# Patient Record
Sex: Male | Born: 1960 | Race: Black or African American | Hispanic: No | Marital: Married | Smoking: Never smoker
Health system: Southern US, Community
[De-identification: ages and names within clinical notes are randomized; demographics above are authoritative.]

## PROBLEM LIST (undated history)

## (undated) DIAGNOSIS — N179 Acute kidney failure, unspecified: Secondary | ICD-10-CM

## (undated) DIAGNOSIS — I10 Essential (primary) hypertension: Secondary | ICD-10-CM

## (undated) DIAGNOSIS — R29898 Other symptoms and signs involving the musculoskeletal system: Secondary | ICD-10-CM

## (undated) DIAGNOSIS — G0491 Myelitis, unspecified: Secondary | ICD-10-CM

## (undated) DIAGNOSIS — I729 Aneurysm of unspecified site: Secondary | ICD-10-CM

## (undated) DIAGNOSIS — R339 Retention of urine, unspecified: Secondary | ICD-10-CM

---

## 1984-05-11 HISTORY — PX: FRACTURE SURGERY: SHX138

## 2016-05-23 ENCOUNTER — Encounter (HOSPITAL_COMMUNITY): Payer: Self-pay | Admitting: Emergency Medicine

## 2016-05-23 DIAGNOSIS — R1013 Epigastric pain: Secondary | ICD-10-CM | POA: Diagnosis present

## 2016-05-23 DIAGNOSIS — R1011 Right upper quadrant pain: Secondary | ICD-10-CM | POA: Diagnosis not present

## 2016-05-23 DIAGNOSIS — K5900 Constipation, unspecified: Secondary | ICD-10-CM | POA: Insufficient documentation

## 2016-05-23 LAB — URINALYSIS, ROUTINE W REFLEX MICROSCOPIC
Bilirubin Urine: NEGATIVE
GLUCOSE, UA: NEGATIVE mg/dL
HGB URINE DIPSTICK: NEGATIVE
Ketones, ur: NEGATIVE mg/dL
LEUKOCYTES UA: NEGATIVE
Nitrite: NEGATIVE
PH: 5 (ref 5.0–8.0)
Protein, ur: NEGATIVE mg/dL
SPECIFIC GRAVITY, URINE: 1.023 (ref 1.005–1.030)

## 2016-05-23 LAB — CBC
HEMATOCRIT: 42.3 % (ref 39.0–52.0)
Hemoglobin: 14.5 g/dL (ref 13.0–17.0)
MCH: 29.5 pg (ref 26.0–34.0)
MCHC: 34.3 g/dL (ref 30.0–36.0)
MCV: 86.2 fL (ref 78.0–100.0)
Platelets: 216 10*3/uL (ref 150–400)
RBC: 4.91 MIL/uL (ref 4.22–5.81)
RDW: 12.6 % (ref 11.5–15.5)
WBC: 6.2 10*3/uL (ref 4.0–10.5)

## 2016-05-23 LAB — LIPASE, BLOOD: LIPASE: 31 U/L (ref 11–51)

## 2016-05-23 LAB — COMPREHENSIVE METABOLIC PANEL
ALT: 27 U/L (ref 17–63)
AST: 26 U/L (ref 15–41)
Albumin: 4.5 g/dL (ref 3.5–5.0)
Alkaline Phosphatase: 71 U/L (ref 38–126)
Anion gap: 11 (ref 5–15)
BUN: 15 mg/dL (ref 6–20)
CHLORIDE: 102 mmol/L (ref 101–111)
CO2: 25 mmol/L (ref 22–32)
Calcium: 9.6 mg/dL (ref 8.9–10.3)
Creatinine, Ser: 1.32 mg/dL — ABNORMAL HIGH (ref 0.61–1.24)
GFR, EST NON AFRICAN AMERICAN: 60 mL/min — AB (ref >60)
Glucose, Bld: 103 mg/dL — ABNORMAL HIGH (ref 65–99)
POTASSIUM: 4.2 mmol/L (ref 3.5–5.1)
SODIUM: 138 mmol/L (ref 135–145)
Total Bilirubin: 0.7 mg/dL (ref 0.3–1.2)
Total Protein: 7.6 g/dL (ref 6.5–8.1)

## 2016-05-23 NOTE — ED Triage Notes (Signed)
Patient with abdominal pain from right side to left and around to back.  No vomiting, no nausea.  Patient states that it just hurts.  No pain with urination.  Patient states that he may be just a little bit of constipation.  Last BM was three days ago.

## 2016-05-24 ENCOUNTER — Emergency Department (HOSPITAL_COMMUNITY): Payer: Medicaid Other

## 2016-05-24 ENCOUNTER — Emergency Department (HOSPITAL_COMMUNITY)
Admission: EM | Admit: 2016-05-24 | Discharge: 2016-05-24 | Disposition: A | Discharge status: Home / Self Care | Payer: Medicaid Other | Attending: Emergency Medicine | Admitting: Emergency Medicine

## 2016-05-24 DIAGNOSIS — R109 Unspecified abdominal pain: Secondary | ICD-10-CM

## 2016-05-24 DIAGNOSIS — K5900 Constipation, unspecified: Secondary | ICD-10-CM

## 2016-05-24 MED ORDER — METHOCARBAMOL 500 MG PO TABS: ORAL_TABLET | ORAL | 0 refills | Status: DC

## 2016-05-24 MED ORDER — LIDOCAINE 5 % EX PTCH
1.0000 | MEDICATED_PATCH | Freq: Once | CUTANEOUS | Status: DC
  Administered 2016-05-24: 1 via TRANSDERMAL
  Filled 2016-05-24: qty 1

## 2016-05-24 MED ORDER — LIDOCAINE HCL (PF) 1 % IJ SOLN
5.0000 mL | Freq: Once | INTRAMUSCULAR | Status: DC
  Filled 2016-05-24: qty 5

## 2016-05-24 MED ORDER — ACETAMINOPHEN 500 MG PO TABS
1000.0000 mg | ORAL_TABLET | Freq: Once | ORAL | Status: AC
  Administered 2016-05-24: 1000 mg via ORAL
  Filled 2016-05-24: qty 2

## 2016-05-24 MED ORDER — LIDOCAINE 4 % EX CREA: 1.0000 "application " | TOPICAL_CREAM | Freq: Three times a day (TID) | CUTANEOUS | 0 refills | Status: DC | PRN

## 2016-05-24 MED ORDER — METHOCARBAMOL 500 MG PO TABS
500.0000 mg | ORAL_TABLET | Freq: Once | ORAL | Status: AC
  Administered 2016-05-24: 500 mg via ORAL
  Filled 2016-05-24: qty 1

## 2016-05-24 NOTE — ED Notes (Signed)
Pt transported to xray 

## 2016-05-24 NOTE — ED Provider Notes (Signed)
[facility]-EMERGENCY DEPT Provider Note   CSN: [id number] Arrival date & time: 05/23/16  2210     History   Chief Complaint Chief Complaint  Patient presents with  . Abdominal Pain      HPI   Blood pressure (!) 153/101, pulse 67, temperature 97.5 F (36.4 C), resp. rate 18, SpO2 100 %.  Perry Morrison is a 56 y.o. male with no significant past medical history complaining of epigastric and right upper quadrant pain radiating around to the right flank worsening over the last 2 days he has been eating and drinking less than normal and is having no bowel movement in that time which is not atypical for him, he rates the pain at 8 out of 10 and says it's exacerbated by movement and certain positions. He notes that he did strain the area while playing basketball earlier in the week. He denies fevers, chills, sick contacts, prior abdominal surgeries. He has slightly less than normal flatus and normal urination. He states that he's had several similar incidents in the past and was told by PCP that it may be shingles.  History reviewed. No pertinent past medical history.  There are no active problems to display for this patient.   History reviewed. No pertinent surgical history.     Home Medications    Prior to Admission medications   Medication Sig Start Date End Date Taking? Authorizing Provider  lidocaine (LMX) 4 % cream Apply 1 application topically 3 (three) times daily as needed. 05/24/16   Lucio Litsey, PA-C  methocarbamol (ROBAXIN) 500 MG tablet Can take up to 1-2 tabs every 6 hours PRN PAIN 05/24/16   Wynetta EmeryNicole Chelcie Estorga, PA-C    Family History No family history on file.  Social History Social History  Substance Use Topics  . Smoking status: Never Smoker  . Smokeless tobacco: Never Used  . Alcohol use No     Allergies   Patient has no known allergies.   Review of Systems Review of Systems  10 systems reviewed and found to be negative, except as noted in the  HPI.   Physical Exam Updated Vital Signs BP 159/100 (BP Location: Right Arm)   Pulse 70   Temp 97.5 F (36.4 C)   Resp 18   SpO2 100%   Physical Exam  Constitutional: He is oriented to person, place, and time. He appears well-developed and well-nourished. No distress.  HENT:  Head: Normocephalic and atraumatic.  Mouth/Throat: Oropharynx is clear and moist.  Eyes: Conjunctivae and EOM are normal. Pupils are equal, round, and reactive to light.  Neck: Normal range of motion.  Cardiovascular: Normal rate, regular rhythm and intact distal pulses.   Pulmonary/Chest: Effort normal and breath sounds normal.  Abdominal: Soft. He exhibits no distension and no mass. There is tenderness. There is no rebound and no guarding. No hernia.  Tender to light palpation as diagrammed  Musculoskeletal: Normal range of motion.  Neurological: He is alert and oriented to person, place, and time.  Skin: Capillary refill takes less than 2 seconds. He is not diaphoretic.     Psychiatric: He has a normal mood and affect.  Nursing note and vitals reviewed.    ED Treatments / Results  Labs (all labs ordered are listed, but only abnormal results are displayed) Labs Reviewed  COMPREHENSIVE METABOLIC PANEL - Abnormal; Notable for the following:       Result Value   Glucose, Bld 103 (*)    Creatinine, Ser 1.32 (*)    GFR  calc non Af Amer 60 (*)    All other components within normal limits  LIPASE, BLOOD  CBC  URINALYSIS, ROUTINE W REFLEX MICROSCOPIC    EKG  EKG Interpretation None       Radiology Dg Abdomen Acute W/chest  Result Date: 05/24/2016 CLINICAL DATA:  Abdominal pain EXAM: DG ABDOMEN ACUTE W/ 1V CHEST COMPARISON:  None. FINDINGS: The lungs are clear.  Cardiomediastinal contours are normal. No free intraperitoneal air. There is a large amount of stool within the colon. No dilated small bowel is identified. IMPRESSION: 1. Clear lungs. 2. No free intraperitoneal air. 3. Large amount of  stool within the colon. No radiographic evidence of small-bowel obstruction. Electronically Signed   By: Deatra Robinson M.D.   On: 05/24/2016 01:57    Procedures Procedures (including critical care time)  Medications Ordered in ED Medications  acetaminophen (TYLENOL) tablet 1,000 mg (1,000 mg Oral Given 05/24/16 0145)  methocarbamol (ROBAXIN) tablet 500 mg (500 mg Oral Given 05/24/16 0204)     Initial Impression / Assessment and Plan / ED Course  I have reviewed the triage vital signs and the nursing notes.  Pertinent labs & imaging results that were available during my care of the patient were reviewed by me and considered in my medical decision making (see chart for details).  Clinical Course      Vitals:   05/23/16 2221 05/24/16 0052 05/24/16 0148  BP: (!) 162/107 (!) 153/101 159/100  Pulse: 78 67 70  Resp: 16 18 18   Temp: 97.5 F (36.4 C)    SpO2: 100% 100% 100%    Medications  acetaminophen (TYLENOL) tablet 1,000 mg (1,000 mg Oral Given 05/24/16 0145)  methocarbamol (ROBAXIN) tablet 500 mg (500 mg Oral Given 05/24/16 0204)    Perry Morrison is 56 y.o. male presenting with Abdominal pain in the right upper quadrant radiating around to the right flank. It's worse with position. It think he may have a muscle strain. There is no rash to suggest zoster. Abdominal exam with diffuse tenderness to light palpation wrapping all the way around the back. Blood work reassuring with normal LFTs, bilirubin and lipase. He does have a slight increase in his creatinine, the dissection only to push fluid is rechecked at primary care next week. Acute abdominal series with Large stool burden. I recommend mag citrate. Robaxin for pain control.  Evaluation does not show pathology that would require ongoing emergent intervention or inpatient treatment. Pt is hemodynamically stable and mentating appropriately. Discussed findings and plan with patient/guardian, who agrees with care plan. All questions  answered. Return precautions discussed and outpatient follow up given.     Final Clinical Impressions(s) / ED Diagnoses   Final diagnoses:  Abdominal wall pain in right flank  Constipation, unspecified constipation type    New Prescriptions Discharge Medication List as of 05/24/2016  2:02 AM    START taking these medications   Details  lidocaine (LMX) 4 % cream Apply 1 application topically 3 (three) times daily as needed., Starting Sun 05/24/2016, Print    methocarbamol (ROBAXIN) 500 MG tablet Can take up to 1-2 tabs every 6 hours PRN PAIN, Print         Wynetta Emery, PA-C 05/24/16 0539    Geoffery Lyons, MD 05/24/16 [id number]

## 2016-05-24 NOTE — Discharge Instructions (Signed)
Take magnesium citrate 150mg  every 12 hours. You should also take Senakot. If you no relief in the the next 24 hours you should start taking miralax until you have a bowel movement. You may also try a fleet enema which are available over -the counter  Your kidney tests today are not normal. You need to drink plenty of water and have this rechecked by your primary care doctor in the next week. Do not take any NSAIDs (motrin, ibuprofen, advil, aleve, excedrin, aspirin, naproxen, goody's powder,  etc.) because they can further hurt your kidneys.   You can also take  tylenol (acetaminophen) 975mg  (this is 3 over the counter pills) four times a day. Do not drink alcohol or combine with other medications that have acetaminophen as an ingredient (Read the labels!).    For breakthrough pain you may take Robaxin. Do not drink alcohol, drive or operate heavy machinery when taking Robaxin.  Please follow with your primary care doctor in the next 2 days for a check-up. They must obtain records for further management.   Do not hesitate to return to the Emergency Department for any new, worsening or concerning symptoms.

## 2016-05-26 ENCOUNTER — Inpatient Hospital Stay (HOSPITAL_COMMUNITY)
Admission: EM | Admit: 2016-05-26 | Discharge: 2016-06-01 | DRG: 098 | Disposition: A | Discharge status: Rehabilitation Facility | Payer: Medicaid Other | Attending: Internal Medicine | Admitting: Internal Medicine

## 2016-05-26 ENCOUNTER — Encounter (HOSPITAL_COMMUNITY): Payer: Self-pay

## 2016-05-26 ENCOUNTER — Emergency Department (HOSPITAL_COMMUNITY): Payer: Medicaid Other

## 2016-05-26 DIAGNOSIS — G373 Acute transverse myelitis in demyelinating disease of central nervous system: Principal | ICD-10-CM | POA: Diagnosis present

## 2016-05-26 DIAGNOSIS — N390 Urinary tract infection, site not specified: Secondary | ICD-10-CM | POA: Diagnosis not present

## 2016-05-26 DIAGNOSIS — Z79899 Other long term (current) drug therapy: Secondary | ICD-10-CM

## 2016-05-26 DIAGNOSIS — R208 Other disturbances of skin sensation: Secondary | ICD-10-CM | POA: Diagnosis not present

## 2016-05-26 DIAGNOSIS — N183 Chronic kidney disease, stage 3 unspecified: Secondary | ICD-10-CM

## 2016-05-26 DIAGNOSIS — I10 Essential (primary) hypertension: Secondary | ICD-10-CM | POA: Diagnosis not present

## 2016-05-26 DIAGNOSIS — I129 Hypertensive chronic kidney disease with stage 1 through stage 4 chronic kidney disease, or unspecified chronic kidney disease: Secondary | ICD-10-CM | POA: Diagnosis present

## 2016-05-26 DIAGNOSIS — G9519 Other vascular myelopathies: Secondary | ICD-10-CM | POA: Diagnosis present

## 2016-05-26 DIAGNOSIS — K5900 Constipation, unspecified: Secondary | ICD-10-CM | POA: Diagnosis present

## 2016-05-26 DIAGNOSIS — R200 Anesthesia of skin: Secondary | ICD-10-CM | POA: Diagnosis not present

## 2016-05-26 DIAGNOSIS — N319 Neuromuscular dysfunction of bladder, unspecified: Secondary | ICD-10-CM | POA: Diagnosis not present

## 2016-05-26 DIAGNOSIS — G8222 Paraplegia, incomplete: Secondary | ICD-10-CM | POA: Diagnosis present

## 2016-05-26 DIAGNOSIS — G959 Disease of spinal cord, unspecified: Secondary | ICD-10-CM | POA: Diagnosis present

## 2016-05-26 DIAGNOSIS — A499 Bacterial infection, unspecified: Secondary | ICD-10-CM | POA: Diagnosis not present

## 2016-05-26 DIAGNOSIS — I729 Aneurysm of unspecified site: Secondary | ICD-10-CM | POA: Diagnosis not present

## 2016-05-26 DIAGNOSIS — M7989 Other specified soft tissue disorders: Secondary | ICD-10-CM | POA: Diagnosis not present

## 2016-05-26 DIAGNOSIS — K592 Neurogenic bowel, not elsewhere classified: Secondary | ICD-10-CM

## 2016-05-26 DIAGNOSIS — I714 Abdominal aortic aneurysm, without rupture: Secondary | ICD-10-CM | POA: Diagnosis present

## 2016-05-26 DIAGNOSIS — Z8249 Family history of ischemic heart disease and other diseases of the circulatory system: Secondary | ICD-10-CM

## 2016-05-26 DIAGNOSIS — R339 Retention of urine, unspecified: Secondary | ICD-10-CM | POA: Diagnosis present

## 2016-05-26 DIAGNOSIS — Q288 Other specified congenital malformations of circulatory system: Secondary | ICD-10-CM | POA: Diagnosis not present

## 2016-05-26 DIAGNOSIS — R202 Paresthesia of skin: Secondary | ICD-10-CM

## 2016-05-26 DIAGNOSIS — N179 Acute kidney failure, unspecified: Secondary | ICD-10-CM

## 2016-05-26 DIAGNOSIS — B961 Klebsiella pneumoniae [K. pneumoniae] as the cause of diseases classified elsewhere: Secondary | ICD-10-CM | POA: Diagnosis not present

## 2016-05-26 DIAGNOSIS — R29898 Other symptoms and signs involving the musculoskeletal system: Secondary | ICD-10-CM

## 2016-05-26 DIAGNOSIS — K648 Other hemorrhoids: Secondary | ICD-10-CM | POA: Diagnosis present

## 2016-05-26 DIAGNOSIS — R140 Abdominal distension (gaseous): Secondary | ICD-10-CM

## 2016-05-26 DIAGNOSIS — M4714 Other spondylosis with myelopathy, thoracic region: Secondary | ICD-10-CM | POA: Diagnosis not present

## 2016-05-26 DIAGNOSIS — R937 Abnormal findings on diagnostic imaging of other parts of musculoskeletal system: Secondary | ICD-10-CM | POA: Insufficient documentation

## 2016-05-26 DIAGNOSIS — F4323 Adjustment disorder with mixed anxiety and depressed mood: Secondary | ICD-10-CM | POA: Diagnosis not present

## 2016-05-26 DIAGNOSIS — F329 Major depressive disorder, single episode, unspecified: Secondary | ICD-10-CM | POA: Diagnosis not present

## 2016-05-26 DIAGNOSIS — K921 Melena: Secondary | ICD-10-CM | POA: Diagnosis not present

## 2016-05-26 DIAGNOSIS — F419 Anxiety disorder, unspecified: Secondary | ICD-10-CM | POA: Diagnosis not present

## 2016-05-26 DIAGNOSIS — K922 Gastrointestinal hemorrhage, unspecified: Secondary | ICD-10-CM | POA: Diagnosis present

## 2016-05-26 DIAGNOSIS — G0491 Myelitis, unspecified: Secondary | ICD-10-CM | POA: Diagnosis not present

## 2016-05-26 HISTORY — DX: Other symptoms and signs involving the musculoskeletal system: R29.898

## 2016-05-26 HISTORY — DX: Acute kidney failure, unspecified: N17.9

## 2016-05-26 HISTORY — DX: Essential (primary) hypertension: I10

## 2016-05-26 HISTORY — DX: Aneurysm of unspecified site: I72.9

## 2016-05-26 HISTORY — DX: Myelitis, unspecified: G04.91

## 2016-05-26 HISTORY — DX: Retention of urine, unspecified: R33.9

## 2016-05-26 LAB — CSF CELL COUNT WITH DIFFERENTIAL
RBC COUNT CSF: 610 /mm3 — AB
RBC COUNT CSF: 680 /mm3 — AB
TUBE #: 1
TUBE #: 4
WBC CSF: 1 /mm3 (ref 0–5)
WBC, CSF: 2 /mm3 (ref 0–5)

## 2016-05-26 LAB — GLUCOSE, CSF: GLUCOSE CSF: 56 mg/dL (ref 40–70)

## 2016-05-26 LAB — CBC WITH DIFFERENTIAL/PLATELET
Basophils Absolute: 0 10*3/uL (ref 0.0–0.1)
Basophils Relative: 0 %
EOS ABS: 0.1 10*3/uL (ref 0.0–0.7)
EOS PCT: 2 %
HCT: 43.6 % (ref 39.0–52.0)
Hemoglobin: 15.1 g/dL (ref 13.0–17.0)
LYMPHS ABS: 2.4 10*3/uL (ref 0.7–4.0)
LYMPHS PCT: 40 %
MCH: 29.7 pg (ref 26.0–34.0)
MCHC: 34.6 g/dL (ref 30.0–36.0)
MCV: 85.7 fL (ref 78.0–100.0)
MONO ABS: 0.5 10*3/uL (ref 0.1–1.0)
Monocytes Relative: 8 %
Neutro Abs: 3 10*3/uL (ref 1.7–7.7)
Neutrophils Relative %: 50 %
PLATELETS: 231 10*3/uL (ref 150–400)
RBC: 5.09 MIL/uL (ref 4.22–5.81)
RDW: 12.8 % (ref 11.5–15.5)
WBC: 6.1 10*3/uL (ref 4.0–10.5)

## 2016-05-26 LAB — BASIC METABOLIC PANEL
Anion gap: 8 (ref 5–15)
BUN: 13 mg/dL (ref 6–20)
CALCIUM: 10 mg/dL (ref 8.9–10.3)
CO2: 27 mmol/L (ref 22–32)
CREATININE: 1.34 mg/dL — AB (ref 0.61–1.24)
Chloride: 100 mmol/L — ABNORMAL LOW (ref 101–111)
GFR calc Af Amer: >60 mL/min (ref >60)
GFR, EST NON AFRICAN AMERICAN: 59 mL/min — AB (ref >60)
Glucose, Bld: 112 mg/dL — ABNORMAL HIGH (ref 65–99)
Potassium: 4.5 mmol/L (ref 3.5–5.1)
Sodium: 135 mmol/L (ref 135–145)

## 2016-05-26 LAB — URINALYSIS, ROUTINE W REFLEX MICROSCOPIC
BILIRUBIN URINE: NEGATIVE
GLUCOSE, UA: NEGATIVE mg/dL
HGB URINE DIPSTICK: NEGATIVE
KETONES UR: NEGATIVE mg/dL
Leukocytes, UA: NEGATIVE
Nitrite: NEGATIVE
PROTEIN: NEGATIVE mg/dL
Specific Gravity, Urine: 1.013 (ref 1.005–1.030)
pH: 5 (ref 5.0–8.0)

## 2016-05-26 LAB — PROTEIN, CSF: Total  Protein, CSF: 81 mg/dL — ABNORMAL HIGH (ref 15–45)

## 2016-05-26 MED ORDER — ONDANSETRON HCL 4 MG/2ML IJ SOLN: 4.0000 mg | Freq: Four times a day (QID) | INTRAMUSCULAR | Status: DC | PRN

## 2016-05-26 MED ORDER — HYDRALAZINE HCL 20 MG/ML IJ SOLN: 5.0000 mg | INTRAMUSCULAR | Status: DC | PRN

## 2016-05-26 MED ORDER — ACETAMINOPHEN 650 MG RE SUPP: 650.0000 mg | Freq: Four times a day (QID) | RECTAL | Status: DC | PRN

## 2016-05-26 MED ORDER — IOPAMIDOL (ISOVUE-370) INJECTION 76%
INTRAVENOUS | Status: AC
  Administered 2016-05-26: 100 mL
  Filled 2016-05-26: qty 100

## 2016-05-26 MED ORDER — GADOBENATE DIMEGLUMINE 529 MG/ML IV SOLN
20.0000 mL | Freq: Once | INTRAVENOUS | Status: AC
  Administered 2016-05-26: 20 mL via INTRAVENOUS

## 2016-05-26 MED ORDER — METHOCARBAMOL 1000 MG/10ML IJ SOLN
500.0000 mg | Freq: Once | INTRAVENOUS | Status: AC
  Administered 2016-05-27: 500 mg via INTRAVENOUS
  Filled 2016-05-26: qty 5

## 2016-05-26 MED ORDER — SODIUM CHLORIDE 0.9 % IV SOLN
INTRAVENOUS | Status: AC
  Administered 2016-05-26 – 2016-05-27 (×2): via INTRAVENOUS

## 2016-05-26 MED ORDER — ACETAMINOPHEN 325 MG PO TABS
650.0000 mg | ORAL_TABLET | Freq: Four times a day (QID) | ORAL | Status: DC | PRN
  Administered 2016-05-26 – 2016-05-30 (×3): 650 mg via ORAL
  Filled 2016-05-26 (×3): qty 2

## 2016-05-26 MED ORDER — HYDROMORPHONE HCL 2 MG/ML IJ SOLN
1.0000 mg | Freq: Once | INTRAMUSCULAR | Status: AC
  Administered 2016-05-26: 1 mg via INTRAVENOUS
  Filled 2016-05-26: qty 1

## 2016-05-26 MED ORDER — METHOCARBAMOL 500 MG PO TABS: 500.0000 mg | ORAL_TABLET | Freq: Three times a day (TID) | ORAL | Status: DC | PRN

## 2016-05-26 MED ORDER — ONDANSETRON HCL 4 MG PO TABS: 4.0000 mg | ORAL_TABLET | Freq: Four times a day (QID) | ORAL | Status: DC | PRN

## 2016-05-26 MED ORDER — HYDROCODONE-ACETAMINOPHEN 5-325 MG PO TABS
1.0000 | ORAL_TABLET | ORAL | Status: DC | PRN
  Administered 2016-05-30: 1 via ORAL
  Administered 2016-05-31 – 2016-06-01 (×2): 2 via ORAL
  Filled 2016-05-26 (×4): qty 2

## 2016-05-26 MED ORDER — DEXAMETHASONE SODIUM PHOSPHATE 4 MG/ML IJ SOLN
4.0000 mg | Freq: Three times a day (TID) | INTRAMUSCULAR | Status: DC
  Administered 2016-05-26 – 2016-05-27 (×3): 4 mg via INTRAVENOUS
  Filled 2016-05-26 (×3): qty 1

## 2016-05-26 NOTE — ED Notes (Signed)
Pt to ct 

## 2016-05-26 NOTE — ED Triage Notes (Signed)
Pt. Continues to have abdominal pain. Pt. Did have a BM.  He now reports having urinary frequency and dysuria.  Pt. Also is having cramping on his hands and legs.  He also reports having rt. Leg numbness beginning last night and now he  Has decreased movement to his rt. Foot.    Pt. Is alert and oriented X4.

## 2016-05-26 NOTE — ED Notes (Signed)
Pt in MRI at present  

## 2016-05-26 NOTE — Progress Notes (Signed)
Patient is admitted from the ED. Patient is alert and oriented x 4. Patient oriented to room and made comfortable.

## 2016-05-26 NOTE — ED Provider Notes (Signed)
[facility]-EMERGENCY DEPT Provider Note   CSN: [id number] Arrival date & time: 05/26/16  0848     History   Chief Complaint Chief Complaint  Patient presents with  . Leg Pain  . Abdominal Pain  . Urinary Frequency    HPI Perry Morrison is a 56 y.o. male.  Patient presents to the emergency department with chief complaint of right leg numbness. He reports that he was seen recently for abdominal pain, and diagnosed with constipation. He states that he still has a little bit of abdominal pain, but that this is improving. He has used castor oil and Epsom salts, and has had a bowel movement since being discharged from the emergency department. He states that he has had new urinary hesitancy which started yesterday. States that it feels like he needs to urinate, but he is only able to get a few drops out. He states that he has been dragging his right foot, and that his right leg does not have strength. He denies any low back pain. Denies any fevers or chills. Denies any history of cancer. Denies bowel or bladder incontinence. Denies saddle anesthesia.   The history is provided by the patient. No language interpreter was used.    History reviewed. No pertinent past medical history.  There are no active problems to display for this patient.   History reviewed. No pertinent surgical history.     Home Medications    Prior to Admission medications   Medication Sig Start Date End Date Taking? Authorizing Provider  lidocaine (LMX) 4 % cream Apply 1 application topically 3 (three) times daily as needed. 05/24/16   Nicole Pisciotta, PA-C  methocarbamol (ROBAXIN) 500 MG tablet Can take up to 1-2 tabs every 6 hours PRN PAIN 05/24/16   Wynetta Emery, PA-C    Family History No family history on file.  Social History Social History  Substance Use Topics  . Smoking status: Never Smoker  . Smokeless tobacco: Never Used  . Alcohol use No     Allergies   Patient has no known  allergies.   Review of Systems Review of Systems  Constitutional: Negative for chills and fever.  Respiratory: Negative for shortness of breath.   Cardiovascular: Negative for chest pain.  Gastrointestinal: Positive for abdominal pain and constipation. Negative for diarrhea, nausea and vomiting.  Genitourinary: Positive for difficulty urinating. Negative for dysuria.  Neurological: Positive for weakness and numbness.     Physical Exam Updated Vital Signs BP (!) 159/101 (BP Location: Right Arm)   Pulse 78   Temp 99.7 F (37.6 C) (Oral)   Resp 18   Ht 6\' 1"  (1.854 m)   Wt 102.1 kg   SpO2 100%   BMI 29.69 kg/m   Physical Exam Physical Exam  Constitutional: Pt appears well-developed and well-nourished. No distress.  HENT:  Head: Normocephalic and atraumatic.  Mouth/Throat: Oropharynx is clear and moist. No oropharyngeal exudate.  Eyes: Conjunctivae are normal.  Neck: Normal range of motion. Neck supple.  No meningismus Cardiovascular: Normal rate, regular rhythm and intact distal pulses.   Pulmonary/Chest: Effort normal and breath sounds normal. No respiratory distress. Pt has no wheezes.  Abdominal: Pt exhibits no distension Musculoskeletal:  No focal lumbar tenderness to palpation, no bony CTLS spine tenderness, deformity, step-off, or crepitus Lymphadenopathy: Pt has no cervical adenopathy.  Neurological: Pt is alert and oriented Speech is clear and goal oriented, follows commands Normal 5/5 strength in upper extremities bilaterally 1/5 ROM and strength of right leg at knee  and ankle and toes, 2/5 at hip 5/5 ROM and strength of left lower extremity Sensation intact Moves extremities without ataxia, coordination intact Diminished knee jerk reflexes intact and symmetrical  Drags right foot, unless lifts entire leg with hip Normal balance No Clonus Skin: Skin is warm and dry. No rash noted. Pt is not diaphoretic. No erythema.  Psychiatric: Pt has a normal mood and  affect. Behavior is normal.  Nursing note and vitals reviewed.   ED Treatments / Results  Labs (all labs ordered are listed, but only abnormal results are displayed) Labs Reviewed  BASIC METABOLIC PANEL - Abnormal; Notable for the following:       Result Value   Chloride 100 (*)    Glucose, Bld 112 (*)    Creatinine, Ser 1.34 (*)    GFR calc non Af Amer 59 (*)    All other components within normal limits  CSF CULTURE  GRAM STAIN  CBC WITH DIFFERENTIAL/PLATELET  URINALYSIS, ROUTINE W REFLEX MICROSCOPIC  CSF CELL COUNT WITH DIFFERENTIAL  CSF CELL COUNT WITH DIFFERENTIAL  GLUCOSE, CSF  PROTEIN, CSF    EKG  EKG Interpretation None       Radiology Ct Head Wo Contrast  Result Date: 05/26/2016 CLINICAL DATA:  Right leg numbness with decreased movement of right foot. EXAM: CT HEAD WITHOUT CONTRAST TECHNIQUE: Contiguous axial images were obtained from the base of the skull through the vertex without intravenous contrast. COMPARISON:  None. FINDINGS: Brain: No evidence of acute infarction, hemorrhage, hydrocephalus, extra-axial collection or mass lesion/mass effect. Vascular: No hyperdense vessel or unexpected calcification. Skull: Normal. Negative for fracture or focal lesion. Sinuses/Orbits: No acute finding. Other: None. IMPRESSION: Normal head CT. Electronically Signed   By: Irish LackGlenn  Yamagata M.D.   On: 05/26/2016 15:21   Mr Thoracic Spine W Wo Contrast  Result Date: 05/26/2016 EXAM: MRI THORACIC AND LUMBAR SPINE WITHOUT AND WITH CONTRAST TECHNIQUE: Multiplanar and multiecho pulse sequences of the thoracic and lumbar spine were obtained without and with intravenous contrast. CONTRAST:  20mL MULTIHANCE GADOBENATE DIMEGLUMINE 529 MG/ML IV SOLN COMPARISON:  None. FINDINGS: MRI THORACIC SPINE FINDINGS Alignment:  Minimal curvature. Vertebrae: Mild edema within the right C7 facet possibly degenerative in origin. Cord: Abnormal signal within the cord extends from the lower T3 through the mid  T10 level. Additionally, blood breakdown products are noted within the right aspect of the cord extending from the T5-6 level to the lower T9 level (maximal at the T7-8 level). Mild enhancement of the cord T6-7 through upper T8 level. Paraspinal and other soft tissues: Mild dilation thoracic aorta with ascending thoracic aorta measuring up to 4.2 cm. Disc levels: C6-7: Moderate bulge/ osteophyte with mild spinal stenosis and minimal cord flattening. Axial images not obtained. Scattered minimal bulges throughout the thoracic region without large thoracic disc herniation causing cord compression. MRI LUMBAR SPINE FINDINGS Segmentation:  Last fully open disc space labeled L5-S1. Alignment:  Mild straightening and slight curvature. Vertebrae:  Endplate degenerative changes L4-5. Conus medullaris: Extends to the T12-L1 level. Paraspinal and other soft tissues: Tiny right renal cyst. Disc levels: L1-2:  Negative. L2-3: Mild facet degenerative changes. Minimal bulge greater left lateral position touching but not compressing exiting left L2 nerve root. L3-4: Bulge. Facet degenerative changes. Mild bilateral foraminal narrowing. Multifactorial mild spinal stenosis and lateral recess narrowing bilaterally. L4-5: Facet degenerative changes and ligamentum flavum hypertrophy greater on the right. Disc degeneration with disc space narrowing endplate reactive changes greater on the right. Bulge and osteophyte greater right  foraminal/ lateral position encroaching upon exiting right L4 nerve root. Moderate narrowing right lateral thecal sac and right lateral recess. Mild spinal stenosis greater on right. L5-S1: Moderate facet degenerative changes. Bulge with osteophyte. Mild lateral recess narrowing. Mild foraminal narrowing greater on the left. IMPRESSION: MRI THORACIC SPINE Abnormal signal within the thoracic cord extends from the lower T3 through the mid T10 level suggestive of edema. Additionally, blood breakdown products are  noted within the right aspect of the cord extending from the T5-6 level to the lower T9 level (maximal at the T7-8 level). Mild enhancement of the cord T6-7 through upper T8 level. Etiology of thoracic cord abnormality is indeterminate. Considerations include: Hemorrhagic transverse myelitis secondary to viral trigger (infection or vaccination). Patient reports possible right-sided shingles in the past and therefore this may be related to such. Result of myelitis secondary to recent infection (such as influenza) with hemorrhagic conversion or ADEM with hemorrhagic conversion are considerations. Thoracic cord ischemia with hemorrhagic conversion. Given the fact the patient has a slightly dilated ascending thoracic aorta, CT angiogram of the chest abdomen and pelvis may be considered to exclude dissection not detected on the current exam. Vascular malformation such as a cavernoma with bleeding and surrounding edema. Although there is blush like enhancement, appearance is not typical for dural fistula as vessels are not seen along the periphery of the cord. Additionally, symptoms occurred more acutely than expected with dural fistula. Post inflammatory myelitis/vasculitis with bleeding (such as Behcet's disease, sarcoidosis, Wegener's, etc.). Tumor felt last likely consideration (ependymomas can bleed but usually enhance more). Metastatic disease felt less likely unless the patient has a known malignancy with hemorrhagic propensity such as melanoma. Neuromyelitis optica. Multiple sclerosis with hemorrhagic conversion felt less likely consideration. C6-7 moderate bulge/ osteophyte with mild spinal stenosis and minimal cord flattening. Axial images not obtained. Scattered minimal bulges throughout the thoracic region without large thoracic disc herniation causing cord compression. Mild edema within the right C7 facet possibly degenerative in origin. MRI LUMBAR SPINE Multi-level degenerative changes most prominent L4-5  level as detailed above. These results were called by telephone at the time of interpretation on 05/26/2016 at 12:44 pm to Dr. Karma Ganja, who verbally acknowledged these results. Electronically Signed   By: Lacy Duverney M.D.   On: 05/26/2016 13:15   Mr Lumbar Spine W Wo Contrast  Result Date: 05/26/2016 EXAM: MRI THORACIC AND LUMBAR SPINE WITHOUT AND WITH CONTRAST TECHNIQUE: Multiplanar and multiecho pulse sequences of the thoracic and lumbar spine were obtained without and with intravenous contrast. CONTRAST:  20mL MULTIHANCE GADOBENATE DIMEGLUMINE 529 MG/ML IV SOLN COMPARISON:  None. FINDINGS: MRI THORACIC SPINE FINDINGS Alignment:  Minimal curvature. Vertebrae: Mild edema within the right C7 facet possibly degenerative in origin. Cord: Abnormal signal within the cord extends from the lower T3 through the mid T10 level. Additionally, blood breakdown products are noted within the right aspect of the cord extending from the T5-6 level to the lower T9 level (maximal at the T7-8 level). Mild enhancement of the cord T6-7 through upper T8 level. Paraspinal and other soft tissues: Mild dilation thoracic aorta with ascending thoracic aorta measuring up to 4.2 cm. Disc levels: C6-7: Moderate bulge/ osteophyte with mild spinal stenosis and minimal cord flattening. Axial images not obtained. Scattered minimal bulges throughout the thoracic region without large thoracic disc herniation causing cord compression. MRI LUMBAR SPINE FINDINGS Segmentation:  Last fully open disc space labeled L5-S1. Alignment:  Mild straightening and slight curvature. Vertebrae:  Endplate degenerative changes L4-5. Conus medullaris:  Extends to the T12-L1 level. Paraspinal and other soft tissues: Tiny right renal cyst. Disc levels: L1-2:  Negative. L2-3: Mild facet degenerative changes. Minimal bulge greater left lateral position touching but not compressing exiting left L2 nerve root. L3-4: Bulge. Facet degenerative changes. Mild bilateral  foraminal narrowing. Multifactorial mild spinal stenosis and lateral recess narrowing bilaterally. L4-5: Facet degenerative changes and ligamentum flavum hypertrophy greater on the right. Disc degeneration with disc space narrowing endplate reactive changes greater on the right. Bulge and osteophyte greater right foraminal/ lateral position encroaching upon exiting right L4 nerve root. Moderate narrowing right lateral thecal sac and right lateral recess. Mild spinal stenosis greater on right. L5-S1: Moderate facet degenerative changes. Bulge with osteophyte. Mild lateral recess narrowing. Mild foraminal narrowing greater on the left. IMPRESSION: MRI THORACIC SPINE Abnormal signal within the thoracic cord extends from the lower T3 through the mid T10 level suggestive of edema. Additionally, blood breakdown products are noted within the right aspect of the cord extending from the T5-6 level to the lower T9 level (maximal at the T7-8 level). Mild enhancement of the cord T6-7 through upper T8 level. Etiology of thoracic cord abnormality is indeterminate. Considerations include: Hemorrhagic transverse myelitis secondary to viral trigger (infection or vaccination). Patient reports possible right-sided shingles in the past and therefore this may be related to such. Result of myelitis secondary to recent infection (such as influenza) with hemorrhagic conversion or ADEM with hemorrhagic conversion are considerations. Thoracic cord ischemia with hemorrhagic conversion. Given the fact the patient has a slightly dilated ascending thoracic aorta, CT angiogram of the chest abdomen and pelvis may be considered to exclude dissection not detected on the current exam. Vascular malformation such as a cavernoma with bleeding and surrounding edema. Although there is blush like enhancement, appearance is not typical for dural fistula as vessels are not seen along the periphery of the cord. Additionally, symptoms occurred more acutely  than expected with dural fistula. Post inflammatory myelitis/vasculitis with bleeding (such as Behcet's disease, sarcoidosis, Wegener's, etc.). Tumor felt last likely consideration (ependymomas can bleed but usually enhance more). Metastatic disease felt less likely unless the patient has a known malignancy with hemorrhagic propensity such as melanoma. Neuromyelitis optica. Multiple sclerosis with hemorrhagic conversion felt less likely consideration. C6-7 moderate bulge/ osteophyte with mild spinal stenosis and minimal cord flattening. Axial images not obtained. Scattered minimal bulges throughout the thoracic region without large thoracic disc herniation causing cord compression. Mild edema within the right C7 facet possibly degenerative in origin. MRI LUMBAR SPINE Multi-level degenerative changes most prominent L4-5 level as detailed above. These results were called by telephone at the time of interpretation on 05/26/2016 at 12:44 pm to Dr. Karma Ganja, who verbally acknowledged these results. Electronically Signed   By: Lacy Duverney M.D.   On: 05/26/2016 13:15   Ct Angio Chest/abd/pel For Dissection W And/or Wo Contrast  Result Date: 05/26/2016 CLINICAL DATA:  Low back pain and abdominal pain. Evidence for thoracic cord ischemia with hemorrhagic conversion on recent MRI. CT angiogram was recommended to exclude an aortic dissection. EXAM: CT ANGIOGRAPHY CHEST, ABDOMEN AND PELVIS TECHNIQUE: Multidetector CT imaging through the chest, abdomen and pelvis was performed using the standard protocol during bolus administration of intravenous contrast. Multiplanar reconstructed images and MIPs were obtained and reviewed to evaluate the vascular anatomy. CONTRAST:  100 mL Isovue 370 COMPARISON:  Thoracic spine MRI 05/26/2016 FINDINGS: CTA CHEST FINDINGS Cardiovascular: The aortic root at the sinuses of Valsalva measures 4.5 cm. The mid ascending thoracic aorta is  aneurysmal measuring up to 4.2 cm. Bovine arch with common  origin to the right brachiocephalic artery and left common carotid artery. The great vessels are patent. Negative for an aortic dissection. Proximal descending thoracic aorta measures 3.2 cm. Pulmonary arteries are patent. Mediastinum/Nodes: No evidence for chest lymphadenopathy. No significant pericardial fluid. Lungs/Pleura: Trachea and mainstem bronchi are patent. Focal pleural thickening or nodularity along the left major fissure on sequence 7, image 80 measuring up to 5 mm. Otherwise, the lungs are clear. No significant airspace disease or consolidation. No pleural effusions. Musculoskeletal: No acute bone abnormality. Review of the MIP images confirms the above findings. CTA ABDOMEN AND PELVIS FINDINGS VASCULAR Aorta: Normal caliber aorta without aneurysm, dissection, vasculitis or significant stenosis. Celiac: Patent without evidence of aneurysm, dissection, vasculitis or significant stenosis. SMA: Patent without evidence of aneurysm, dissection, vasculitis or significant stenosis. Renals: Both renal arteries are patent without evidence of aneurysm, dissection, vasculitis, fibromuscular dysplasia or significant stenosis. There is an accessory inferior left renal artery which is patent. IMA: Patent without evidence of aneurysm, dissection, vasculitis or significant stenosis. Inflow: Mild wall calcifications in the iliac arteries without significant stenosis. Internal and external iliac arteries are patent bilaterally. No dissection. Veins: No obvious venous abnormality within the limitations of this arterial phase study. Review of the MIP images confirms the above findings. NON-VASCULAR Hepatobiliary: No acute abnormality to the liver or gallbladder. Pancreas: Normal appearance of the pancreas without inflammation or duct dilatation. Spleen: Normal appearance of spleen without enlargement. Adrenals/Urinary Tract: Normal adrenal glands. Normal appearance of both kidneys without suspicious lesion or  hydronephrosis. Urinary bladder is decompressed with a Foley catheter. Stomach/Bowel: There is a large amount of stool in the right colon and hepatic flexure. No evidence for bowel obstruction or focal inflammation. Lymphatic: No significant lymph node enlargement in the abdomen and pelvis. Soft tissue in the mesentery adjacent to loops of bowel on sequence 6, image 257 is nonspecific and may represent a few mesenteric lymph nodes. Reproductive: Prostate is unremarkable. Seminal vesicles are unremarkable. Other: No significant ascites in the abdomen or pelvis. Musculoskeletal: Mild retrolisthesis at L4-L5 with some disc space narrowing. Refer to recent spine MRI. Review of the MIP images confirms the above findings. IMPRESSION: Negative for an aortic dissection. No acute intrathoracic or intra-abdominal disease. 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 Ascending thoracic aorta measures up to 4.2 cm. Recommend annual imaging followup by CTA or MRA. This recommendation follows 2010 ACCF/AHA/AATS/ACR/ASA/SCA/SCAI/SIR/STS/SVM Guidelines for the Diagnosis and Management of Patients with Thoracic Aortic Disease. Circulation. 2010; 121: [id number] 5 mm nodule along the left major fissure is indeterminate. No follow-up needed if patient is low-risk. Non-contrast chest CT can be considered in 12 months if patient is high-risk. This recommendation follows the consensus statement: Guidelines for Management of Incidental Pulmonary Nodules Detected on CT Images: From the Fleischner Society 2017; Radiology 2017; 284:228-243. Large amount of stool involving the right colon. Electronically Signed   By: Richarda Overlie M.D.   On: 05/26/2016 15:24    Procedures .Lumbar Puncture Date/Time: 05/26/2016 4:04 PM Performed by: Roxy Horseman Authorized by: Roxy Horseman   Consent:    Consent obtained:  Verbal and written   Consent given by:  Patient   Risks discussed:  Nerve damage Pre-procedure details:     Procedure purpose:  Diagnostic Anesthesia (see MAR for exact dosages):    Anesthesia method:  Local infiltration   Local anesthetic:  Lidocaine 1% w/o epi Procedure details:    Lumbar space:  L4-L5 interspace   Patient position:  L lateral decubitus   Needle gauge:  18   Needle type:  Spinal needle - Quincke tip   Needle length (in):  3.5   Ultrasound guidance: no     Number of attempts:  2   Opening pressure (cm H2O):  22   Closing pressure (cm H2O):  19   Fluid appearance:  Clear   Tubes of fluid:  4   Total volume (ml):  8 Post-procedure:    Puncture site:  Adhesive bandage applied   Patient tolerance of procedure:  Tolerated well, no immediate complications   (including critical care time)  Medications Ordered in ED Medications - No data to display   Initial Impression / Assessment and Plan / ED Course  I have reviewed the triage vital signs and the nursing notes.  Pertinent labs & imaging results that were available during my care of the patient were reviewed by me and considered in my medical decision making (see chart for details).  Clinical Course     Patient with right leg numbness and weakness. Also reports urinary hesitancy. Bladder scan shows between 800 and 1000 ml post void residual.  Will insert foley catheter.  Patient will need MR of lumbar spine.  MRI as above. 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  CT dissection study recommended per radiology. I discussed case with neurology, who recommends inpatient admission. Neurology will consult. Also recommends lumbar puncture.  Harvin Hazel, NP, and Dr. Konrad Dolores for admitting the patient.  Neurology to consult.    Final Clinical Impressions(s) / ED Diagnoses   Final diagnoses:  Numbness and tingling of lower extremity    New Prescriptions New Prescriptions   No medications on file     Roxy Horseman, PA-C 05/26/16 1607    Jerelyn Scott, MD 05/26/16 [phone]

## 2016-05-26 NOTE — Progress Notes (Signed)
Assessed patient. WNL except RLE numbness and complaints of increasing cramps on bil. Hands. Labs checked. Informed Dr. Clearence PedSchorr regarding pt complaint and prior discontinuation of Robaxin. See orders. Will continue to monitor.

## 2016-05-26 NOTE — Consult Note (Signed)
Reason for Consult: Transverse myelitis Referring Physician: Payton Emerald M.D.  Perry Morrison is an 56 y.o. male.  HPI: The patient is a 56 year old male who had noted some abdominal pain and constipation several days before and was seen in emergency room given some muscle relaxers but then again a half ago started to develop right lower extremity weakness. 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 There is some mass effect in the cord itself from this lesion. The rest of his cervical spine looks normal. The patient had an lumbar puncture done but those results are not available. His exam features high-grade weakness in that right lower extremity which is now essentially plegic.  Past Medical History:  Diagnosis Date  . AKI (acute kidney injury) ([place])   . 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 Hypertension   . Myelitis ([place])   . Right leg weakness   . Urinary retention     History reviewed. No pertinent surgical history.  Family History  Problem Relation Age of Onset  . Hypertension Mother   . AAA (abdominal aortic aneurysm) Mother     Social History:  reports that he has never smoked. He has never used smokeless tobacco. He reports that he does not drink alcohol or use drugs.  Allergies: No Known Allergies  Medications: I have reviewed the patient's current medications. I have not reviewed the patient's medications  Results for orders placed or performed during the hospital encounter of 05/26/16 (from the past 48 hour(s))  Urinalysis, Routine w reflex microscopic     Status: None   Collection Time: 05/26/16  9:27 AM  Result Value Ref Range   Color, Urine YELLOW YELLOW   APPearance CLEAR CLEAR   Specific Gravity, Urine 1.013 1.005 - 1.030   pH 5.0 5.0 - 8.0    Glucose, UA NEGATIVE NEGATIVE mg/dL   Hgb urine dipstick NEGATIVE NEGATIVE   Bilirubin Urine NEGATIVE NEGATIVE   Ketones, ur NEGATIVE NEGATIVE mg/dL   Protein, ur NEGATIVE NEGATIVE mg/dL   Nitrite NEGATIVE NEGATIVE   Leukocytes, UA NEGATIVE NEGATIVE  CBC with Differential/Platelet     Status: None   Collection Time: 05/26/16  9:28 AM  Result Value Ref Range   WBC 6.1 4.0 - 10.5 K/uL   RBC 5.09 4.22 - 5.81 MIL/uL   Hemoglobin 15.1 13.0 - 17.0 g/dL   HCT 43.6 39.0 - 52.0 %   MCV 85.7 78.0 - 100.0 fL   MCH 29.7 26.0 - 34.0 pg   MCHC 34.6 30.0 - 36.0 g/dL   RDW 12.8 11.5 - 15.5 %   Platelets 231 150 - 400 K/uL   Neutrophils Relative % 50 %   Neutro Abs 3.0 1.7 - 7.7 K/uL   Lymphocytes Relative 40 %   Lymphs Abs 2.4 0.7 - 4.0 K/uL   Monocytes Relative 8 %   Monocytes Absolute 0.5 0.1 - 1.0 K/uL   Eosinophils Relative 2 %   Eosinophils Absolute 0.1 0.0 - 0.7 K/uL   Basophils Relative 0 %   Basophils Absolute 0.0 0.0 - 0.1 K/uL  Basic metabolic panel     Status: Abnormal   Collection Time: 05/26/16  9:28 AM  Result Value Ref Range   Sodium 135 135 - 145 mmol/L   Potassium  4.5 3.5 - 5.1 mmol/L   Chloride 100 (L) 101 - 111 mmol/L   CO2 27 22 - 32 mmol/L   Glucose, Bld 112 (H) 65 - 99 mg/dL   BUN 13 6 - 20 mg/dL   Creatinine, Ser 1.34 (H) 0.61 - 1.24 mg/dL   Calcium 10.0 8.9 - 10.3 mg/dL   GFR calc non Af Amer 59 (L) >60 mL/min   GFR calc Af Amer >60 >60 mL/min    Comment: (NOTE) The eGFR has been calculated using the CKD EPI equation. This calculation has not been validated in all clinical situations. eGFR's persistently <60 mL/min signify possible Chronic Kidney Disease.    Anion gap 8 5 - 15  CSF cell count with differential collection tube #: 1     Status: Abnormal   Collection Time: 05/26/16  1:54 PM  Result Value Ref Range   Tube # 1    Color, CSF COLORLESS COLORLESS   Appearance, CSF CLEAR CLEAR   Supernatant NOT INDICATED    RBC Count, CSF 610 (H) 0 /cu mm    WBC, CSF 1 0 - 5 /cu mm   Other Cells, CSF TOO FEW TO COUNT, SMEAR AVAILABLE FOR REVIEW     Comment: Few lymphs, rare monos.  CSF cell count with differential collection tube #: 4     Status: Abnormal   Collection Time: 05/26/16  1:54 PM  Result Value Ref Range   Tube # 4    Color, CSF COLORLESS COLORLESS   Appearance, CSF CLEAR CLEAR   Supernatant NOT INDICATED    RBC Count, CSF 680 (H) 0 /cu mm   WBC, CSF 2 0 - 5 /cu mm   Other Cells, CSF TOO FEW TO COUNT, SMEAR AVAILABLE FOR REVIEW     Comment: Few lymphs, rare segs & monos.  Glucose, CSF     Status: None   Collection Time: 05/26/16  1:54 PM  Result Value Ref Range   Glucose, CSF 56 40 - 70 mg/dL  Protein, CSF     Status: Abnormal   Collection Time: 05/26/16  1:54 PM  Result Value Ref Range   Total  Protein, CSF 81 (H) 15 - 45 mg/dL  CSF culture     Status: None (Preliminary result)   Collection Time: 05/26/16  4:00 PM  Result Value Ref Range   Specimen Description CSF    Special Requests NONE    Gram Stain      CYTOSPIN SMEAR WBC PRESENT,BOTH PMN AND MONONUCLEAR RBC PRESENT NO ORGANISMS SEEN    Culture PENDING    Report Status PENDING     Ct Head Wo Contrast  Result Date: 05/26/2016 CLINICAL DATA:  Right leg numbness with decreased movement of right foot. EXAM: CT HEAD WITHOUT CONTRAST TECHNIQUE: Contiguous axial images were obtained from the base of the skull through the vertex without intravenous contrast. COMPARISON:  None. FINDINGS: Brain: No evidence of acute infarction, hemorrhage, hydrocephalus, extra-axial collection or mass lesion/mass effect. Vascular: No hyperdense vessel or unexpected calcification. Skull: Normal. Negative for fracture or focal lesion. Sinuses/Orbits: No acute finding. Other: None. IMPRESSION: Normal head CT. Electronically Signed   By: Aletta Edouard M.D.   On: 05/26/2016 15:21   Mr Thoracic Spine W Wo Contrast  Result Date: 05/26/2016 EXAM: MRI THORACIC AND LUMBAR SPINE WITHOUT AND WITH  CONTRAST TECHNIQUE: Multiplanar and multiecho pulse sequences of the thoracic and lumbar spine were obtained without and with intravenous contrast. CONTRAST:  92m MULTIHANCE GADOBENATE DIMEGLUMINE 529  MG/ML IV SOLN COMPARISON:  None. FINDINGS: MRI THORACIC SPINE FINDINGS Alignment:  Minimal curvature. Vertebrae: Mild edema within the right C7 facet possibly degenerative in origin. Cord: Abnormal signal within the cord extends from the lower T3 through the mid T10 level. Additionally, blood breakdown products are noted within the right aspect of the cord extending from the T5-6 level to the lower T9 level (maximal at the T7-8 level). Mild enhancement of the cord T6-7 through upper T8 level. Paraspinal and other soft tissues: Mild dilation thoracic aorta with ascending thoracic aorta measuring up to 4.2 cm. Disc levels: C6-7: Moderate bulge/ osteophyte with mild spinal stenosis and minimal cord flattening. Axial images not obtained. Scattered minimal bulges throughout the thoracic region without large thoracic disc herniation causing cord compression. MRI LUMBAR SPINE FINDINGS Segmentation:  Last fully open disc space labeled L5-S1. Alignment:  Mild straightening and slight curvature. Vertebrae:  Endplate degenerative changes L4-5. Conus medullaris: Extends to the T12-L1 level. Paraspinal and other soft tissues: Tiny right renal cyst. Disc levels: L1-2:  Negative. L2-3: Mild facet degenerative changes. Minimal bulge greater left lateral position touching but not compressing exiting left L2 nerve root. L3-4: Bulge. Facet degenerative changes. Mild bilateral foraminal narrowing. Multifactorial mild spinal stenosis and lateral recess narrowing bilaterally. L4-5: Facet degenerative changes and ligamentum flavum hypertrophy greater on the right. Disc degeneration with disc space narrowing endplate reactive changes greater on the right. Bulge and osteophyte greater right foraminal/ lateral position encroaching upon exiting  right L4 nerve root. Moderate narrowing right lateral thecal sac and right lateral recess. Mild spinal stenosis greater on right. L5-S1: Moderate facet degenerative changes. Bulge with osteophyte. Mild lateral recess narrowing. Mild foraminal narrowing greater on the left. IMPRESSION: MRI THORACIC SPINE Abnormal signal within the thoracic cord extends from the lower T3 through the mid T10 level suggestive of edema. Additionally, blood breakdown products are noted within the right aspect of the cord extending from the T5-6 level to the lower T9 level (maximal at the T7-8 level). Mild enhancement of the cord T6-7 through upper T8 level. Etiology of thoracic cord abnormality is indeterminate. Considerations include: Hemorrhagic transverse myelitis secondary to viral trigger (infection or vaccination). Patient reports possible right-sided shingles in the past and therefore this may be related to such. Result of myelitis secondary to recent infection (such as influenza) with hemorrhagic conversion or ADEM with hemorrhagic conversion are considerations. Thoracic cord ischemia with hemorrhagic conversion. Given the fact the patient has a slightly dilated ascending thoracic aorta, CT angiogram of the chest abdomen and pelvis may be considered to exclude dissection not detected on the current exam. Vascular malformation such as a cavernoma with bleeding and surrounding edema. Although there is blush like enhancement, appearance is not typical for dural fistula as vessels are not seen along the periphery of the cord. Additionally, symptoms occurred more acutely than expected with dural fistula. Post inflammatory myelitis/vasculitis with bleeding (such as Behcet's disease, sarcoidosis, Wegener's, etc.). Tumor felt last likely consideration (ependymomas can bleed but usually enhance more). Metastatic disease felt less likely unless the patient has a known malignancy with hemorrhagic propensity such as melanoma. Neuromyelitis  optica. Multiple sclerosis with hemorrhagic conversion felt less likely consideration. C6-7 moderate bulge/ osteophyte with mild spinal stenosis and minimal cord flattening. Axial images not obtained. Scattered minimal bulges throughout the thoracic region without large thoracic disc herniation causing cord compression. Mild edema within the right C7 facet possibly degenerative in origin. MRI LUMBAR SPINE Multi-level degenerative changes most prominent L4-5 level as detailed  above. These results were called by telephone at the time of interpretation on 05/26/2016 at 12:44 pm to Dr. Canary Brim, who verbally acknowledged these results. Electronically Signed   By: Genia Del M.D.   On: 05/26/2016 13:15   Mr Lumbar Spine W Wo Contrast  Result Date: 05/26/2016 EXAM: MRI THORACIC AND LUMBAR SPINE WITHOUT AND WITH CONTRAST TECHNIQUE: Multiplanar and multiecho pulse sequences of the thoracic and lumbar spine were obtained without and with intravenous contrast. CONTRAST:  71m MULTIHANCE GADOBENATE DIMEGLUMINE 529 MG/ML IV SOLN COMPARISON:  None. FINDINGS: MRI THORACIC SPINE FINDINGS Alignment:  Minimal curvature. Vertebrae: Mild edema within the right C7 facet possibly degenerative in origin. Cord: Abnormal signal within the cord extends from the lower T3 through the mid T10 level. Additionally, blood breakdown products are noted within the right aspect of the cord extending from the T5-6 level to the lower T9 level (maximal at the T7-8 level). Mild enhancement of the cord T6-7 through upper T8 level. Paraspinal and other soft tissues: Mild dilation thoracic aorta with ascending thoracic aorta measuring up to 4.2 cm. Disc levels: C6-7: Moderate bulge/ osteophyte with mild spinal stenosis and minimal cord flattening. Axial images not obtained. Scattered minimal bulges throughout the thoracic region without large thoracic disc herniation causing cord compression. MRI LUMBAR SPINE FINDINGS Segmentation:  Last fully open disc  space labeled L5-S1. Alignment:  Mild straightening and slight curvature. Vertebrae:  Endplate degenerative changes L4-5. Conus medullaris: Extends to the T12-L1 level. Paraspinal and other soft tissues: Tiny right renal cyst. Disc levels: L1-2:  Negative. L2-3: Mild facet degenerative changes. Minimal bulge greater left lateral position touching but not compressing exiting left L2 nerve root. L3-4: Bulge. Facet degenerative changes. Mild bilateral foraminal narrowing. Multifactorial mild spinal stenosis and lateral recess narrowing bilaterally. L4-5: Facet degenerative changes and ligamentum flavum hypertrophy greater on the right. Disc degeneration with disc space narrowing endplate reactive changes greater on the right. Bulge and osteophyte greater right foraminal/ lateral position encroaching upon exiting right L4 nerve root. Moderate narrowing right lateral thecal sac and right lateral recess. Mild spinal stenosis greater on right. L5-S1: Moderate facet degenerative changes. Bulge with osteophyte. Mild lateral recess narrowing. Mild foraminal narrowing greater on the left. IMPRESSION: MRI THORACIC SPINE Abnormal signal within the thoracic cord extends from the lower T3 through the mid T10 level suggestive of edema. Additionally, blood breakdown products are noted within the right aspect of the cord extending from the T5-6 level to the lower T9 level (maximal at the T7-8 level). Mild enhancement of the cord T6-7 through upper T8 level. Etiology of thoracic cord abnormality is indeterminate. Considerations include: Hemorrhagic transverse myelitis secondary to viral trigger (infection or vaccination). Patient reports possible right-sided shingles in the past and therefore this may be related to such. Result of myelitis secondary to recent infection (such as influenza) with hemorrhagic conversion or ADEM with hemorrhagic conversion are considerations. Thoracic cord ischemia with hemorrhagic conversion. Given the  fact the patient has a slightly dilated ascending thoracic aorta, CT angiogram of the chest abdomen and pelvis may be considered to exclude dissection not detected on the current exam. Vascular malformation such as a cavernoma with bleeding and surrounding edema. Although there is blush like enhancement, appearance is not typical for dural fistula as vessels are not seen along the periphery of the cord. Additionally, symptoms occurred more acutely than expected with dural fistula. Post inflammatory myelitis/vasculitis with bleeding (such as Behcet's disease, sarcoidosis, Wegener's, etc.). Tumor felt last likely consideration (ependymomas can bleed  but usually enhance more). Metastatic disease felt less likely unless the patient has a known malignancy with hemorrhagic propensity such as melanoma. Neuromyelitis optica. Multiple sclerosis with hemorrhagic conversion felt less likely consideration. C6-7 moderate bulge/ osteophyte with mild spinal stenosis and minimal cord flattening. Axial images not obtained. Scattered minimal bulges throughout the thoracic region without large thoracic disc herniation causing cord compression. Mild edema within the right C7 facet possibly degenerative in origin. MRI LUMBAR SPINE Multi-level degenerative changes most prominent L4-5 level as detailed above. These results were called by telephone at the time of interpretation on 05/26/2016 at 12:44 pm to Dr. Canary Brim, who verbally acknowledged these results. Electronically Signed   By: Genia Del M.D.   On: 05/26/2016 13:15   Ct Angio Chest/abd/pel For Dissection W And/or Wo Contrast  Result Date: 05/26/2016 CLINICAL DATA:  Low back pain and abdominal pain. Evidence for thoracic cord ischemia with hemorrhagic conversion on recent MRI. CT angiogram was recommended to exclude an aortic dissection. EXAM: CT ANGIOGRAPHY CHEST, ABDOMEN AND PELVIS TECHNIQUE: Multidetector CT imaging through the chest, abdomen and pelvis was performed using  the standard protocol during bolus administration of intravenous contrast. Multiplanar reconstructed images and MIPs were obtained and reviewed to evaluate the vascular anatomy. CONTRAST:  100 mL Isovue 370 COMPARISON:  Thoracic spine MRI 05/26/2016 FINDINGS: CTA CHEST FINDINGS Cardiovascular: The aortic root at the sinuses of Valsalva measures 4.5 cm. The mid ascending thoracic aorta is aneurysmal measuring up to 4.2 cm. 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 The great vessels are patent. Negative for an aortic dissection. Proximal descending thoracic aorta measures 3.2 cm. Pulmonary arteries are patent. Mediastinum/Nodes: No evidence for chest lymphadenopathy. No significant pericardial fluid. Lungs/Pleura: Trachea and mainstem bronchi are patent. Focal pleural thickening or nodularity along the left major fissure on sequence 7, image 80 measuring up to 5 mm. Otherwise, the lungs are clear. No significant airspace disease or consolidation. No pleural effusions. Musculoskeletal: No acute bone abnormality. Review of the MIP images confirms the above findings. CTA ABDOMEN AND PELVIS FINDINGS VASCULAR Aorta: Normal caliber aorta without aneurysm, dissection, vasculitis or significant stenosis. Celiac: Patent without evidence of aneurysm, dissection, vasculitis or significant stenosis. SMA: Patent without evidence of aneurysm, dissection, vasculitis or significant stenosis. Renals: Both renal arteries are patent without evidence of aneurysm, dissection, vasculitis, fibromuscular dysplasia or significant stenosis. There is an accessory inferior left renal artery which is patent. IMA: Patent without evidence of aneurysm, dissection, vasculitis or significant stenosis. Inflow: Mild wall calcifications in the iliac arteries without significant stenosis. Internal and external iliac arteries are patent bilaterally. No dissection. Veins: No obvious venous abnormality  within the limitations of this arterial phase study. Review of the MIP images confirms the above findings. NON-VASCULAR Hepatobiliary: No acute abnormality to the liver or gallbladder. Pancreas: Normal appearance of the pancreas without inflammation or duct dilatation. Spleen: Normal appearance of spleen without enlargement. Adrenals/Urinary Tract: Normal adrenal glands. Normal appearance of both kidneys without suspicious lesion or hydronephrosis. Urinary bladder is decompressed with a Foley catheter. Stomach/Bowel: There is a large amount of stool in the right colon and hepatic flexure. No evidence for bowel obstruction or focal inflammation. Lymphatic: No significant lymph node enlargement in the abdomen and pelvis. Soft tissue in the mesentery adjacent to loops of bowel on sequence 6, image 257 is nonspecific and may represent a few mesenteric lymph nodes. Reproductive: Prostate is unremarkable. Seminal vesicles are unremarkable. Other: No significant ascites in  the abdomen or pelvis. Musculoskeletal: Mild retrolisthesis at L4-L5 with some disc space narrowing. Refer to recent spine MRI. Review of the MIP images confirms the above findings. IMPRESSION: Negative for an aortic dissection. No acute intrathoracic or intra-abdominal disease. 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 Ascending thoracic aorta measures up to 4.2 cm. Recommend annual imaging followup by CTA or MRA. This recommendation follows 2010 ACCF/AHA/AATS/ACR/ASA/SCA/SCAI/SIR/STS/SVM Guidelines for the Diagnosis and Management of Patients with Thoracic Aortic Disease. Circulation. 2010; 121: [id number] 5 mm nodule along the left major fissure is indeterminate. No follow-up needed if patient is low-risk. Non-contrast chest CT can be considered in 12 months if patient is high-risk. This recommendation follows the consensus statement: Guidelines for Management of Incidental Pulmonary Nodules Detected on CT Images: From the Fleischner  Society 2017; Radiology 2017; 284:228-243. Large amount of stool involving the right colon. Electronically Signed   By: Markus Daft M.D.   On: 05/26/2016 15:24    Review of Systems  HENT: Negative.   Eyes: Negative.   Respiratory: Negative.   Cardiovascular: Negative.   Gastrointestinal: Negative.   Genitourinary: Negative.   Musculoskeletal: Positive for back pain.  Skin: Negative.   Neurological: Positive for focal weakness and weakness.  Endo/Heme/Allergies: Negative.   Psychiatric/Behavioral: Negative.    Blood pressure (!) 149/88, pulse 67, temperature 98.3 F (36.8 C), temperature source Oral, resp. rate 16, height 6' 1"  (1.854 m), weight 102.1 kg (225 lb), SpO2 100 %. Physical Exam  Constitutional: He is oriented to person, place, and time. He appears well-developed and well-nourished.  HENT:  Head: Normocephalic and atraumatic.  Eyes: Conjunctivae and EOM are normal. Pupils are equal, round, and reactive to light.  Neck: Normal range of motion. Neck supple.  Cardiovascular: Normal rate and regular rhythm.   Respiratory: Effort normal.  GI: Soft. Bowel sounds are normal.  Musculoskeletal: Normal range of motion.  Neurological: He is alert and oriented to person, place, and time.  1+ left patella and 1+ left Achilles reflex absent right patellar and Achilles reflex Babinski's are upgoing bilaterally. Sensation appears intact and left lower extremity intact in right lower extremity also upper extremity strength and reflexes are normal. Sensory level approximates a T6 level.  Skin: Skin is warm and dry.  Psychiatric: He has a normal mood and affect. His behavior is normal. Judgment and thought content normal.    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 There is no significant mass effect or compression of the cord I do not feel that surgical intervention is likely to yield any improvement clinically. I would advise high-dose  steroids and observation. Neurology has seen the patient and his obtain CSF. This may yield some information as to the nature of the transverse myelitis. He may require follow-up with a repeat MRI in approximately 3 months if his symptoms do not regress or resolves to a significant degree.  Perry Morrison 05/26/2016, 9:32 PM

## 2016-05-26 NOTE — ED Notes (Signed)
Assisted Emily(RN) with catheter

## 2016-05-26 NOTE — H&P (Signed)
History and Physical    Perry GraffJamie Whitmire WUJ:[id number] DOB: 11/03/1960 DOA: 05/26/2016  PCP: No PCP Per Patient Patient coming from: home Chief Complaint: abdominal pain/constipation/right leg weakness/urinary retention    HPI: Perry Morrison is a 56 y.o. male with medical history significant for hypertension this emergency Department chief complaint of persistent abdominal pain and sudden onset right foot leg weakness/numbness tingling. Initial evaluation includes an MRI of lumbar spine concerning for nerve compression at L4 and reveals urinary retention, acute kidney injury.  Information is obtained from the patient. He states he was in the emergency department 2 days ago chief complaint of abdominal pain he was diagnosed with constipation was treated had a large bowel movement and pain improved. Yesterday he ate and drank his normal amount and the abdominal pain gradually recurred. In addition he developed new right foot leg weakness with numbness and tingling. He reports he's been "dragging mild right foot" since yesterday. Associated symptoms include worsening frequency and difficulty initiating urinary flow and weak urine stream. He denies low back pain. Denies any urinary or bladder incontinence.    ED Course: In emergency department she was 99.7 he's hypertensive he's not hypoxic and nontoxic appearing.  Review of Systems: As per HPI otherwise 10 point review of systems negative.   Ambulatory Status: Ambulates independently recent falls  Past Medical History:  Diagnosis Date  . AKI (acute kidney injury) (HCC)   . 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 Hypertension   . Myelitis (HCC)   . Right leg weakness   . Urinary retention     History reviewed. No pertinent surgical history.  Social History   Social History  . Marital status: Married    Spouse name: N/A  . Number of children: N/A  . Years of education: N/A   Occupational History  .  Not on file.   Social History Main Topics  . Smoking status: Never Smoker  . Smokeless tobacco: Never Used  . Alcohol use No  . Drug use: No  . Sexual activity: Not on file   Other Topics Concern  . Not on file   Social History Narrative  . No narrative on file  Lives at home with his girlfriend  No Known Allergies  Family History  Problem Relation Age of Onset  . Hypertension Mother   . AAA (abdominal aortic aneurysm) Mother     Prior to Admission medications   Medication Sig Start Date End Date Taking? Authorizing Provider  lidocaine (LMX) 4 % cream Apply 1 application topically 3 (three) times daily as needed. 05/24/16   Nicole Pisciotta, PA-C  methocarbamol (ROBAXIN) 500 MG tablet Can take up to 1-2 tabs every 6 hours PRN PAIN 05/24/16   Wynetta EmeryNicole Pisciotta, PA-C    Physical Exam: Vitals:   05/26/16 1300 05/26/16 1330 05/26/16 1410 05/26/16 1430  BP: 148/94 (!) 152/106 156/100 152/94  Pulse: 74 75 73 79  Resp: 14 16 12 12   Temp:      TempSrc:      SpO2: 100% 99% 100% 100%  Weight:      Height:         General:  Appears Slightly anxious but not uncomfortable well-nourished Eyes:  PERRL, EOMI, normal lids, iris ENT:  grossly normal hearing, lips & tongue, his membranes of her mouth are pink slightly dry Neck:  no LAD, masses or thyromegaly, full range of motion Cardiovascular:  RRR, no m/r/g. No LE edema.  Respiratory:  CTA bilaterally, no w/r/r. Normal respiratory effort. Abdomen:  soft, ntnd, sluggish bowel sounds no guarding or rebounding Skin:  no rash or induration seen on limited exam Musculoskeletal:  Joints without swelling/erythema Psychiatric:  grossly normal mood and affect, speech fluent and appropriate, AOx3 Neurologic:  Bilateral grip 5 out of 5, right leg strength 1 out of 5 decreased sensation right foot, decreased range of motion at right knee and ankle and leg with 5 out of 5 strength full range of motion sensation intact left leg left intact  attempts to raise right leg at hip. Unable to lift right leg  Labs on Admission: I have personally reviewed following labs and imaging studies  CBC:  Recent Labs Lab 05/23/16 2229 05/26/16 0928  WBC 6.2 6.1  NEUTROABS  --  3.0  HGB 14.5 15.1  HCT 42.3 43.6  MCV 86.2 85.7  PLT 216 231   Basic Metabolic Panel:  Recent Labs Lab 05/23/16 2229 05/26/16 0928  NA 138 135  K 4.2 4.5  CL 102 100*  CO2 25 27  GLUCOSE 103* 112*  BUN 15 13  CREATININE 1.32* 1.34*  CALCIUM 9.6 10.0   GFR: Estimated Creatinine Clearance: 79.2 mL/min (by C-G formula based on SCr of 1.34 mg/dL (H)). Liver Function Tests:  Recent Labs Lab 05/23/16 2229  AST 26  ALT 27  ALKPHOS 71  BILITOT 0.7  PROT 7.6  ALBUMIN 4.5    Recent Labs Lab 05/23/16 2229  LIPASE 31   No results for input(s): AMMONIA in the last 168 hours. Coagulation Profile: No results for input(s): INR, PROTIME in the last 168 hours. Cardiac Enzymes: No results for input(s): CKTOTAL, CKMB, CKMBINDEX, TROPONINI in the last 168 hours. BNP (last 3 results) No results for input(s): PROBNP in the last 8760 hours. HbA1C: No results for input(s): HGBA1C in the last 72 hours. CBG: No results for input(s): GLUCAP in the last 168 hours. Lipid Profile: No results for input(s): CHOL, HDL, LDLCALC, TRIG, CHOLHDL, LDLDIRECT in the last 72 hours. Thyroid Function Tests: No results for input(s): TSH, T4TOTAL, FREET4, T3FREE, THYROIDAB in the last 72 hours. Anemia Panel: No results for input(s): VITAMINB12, FOLATE, FERRITIN, TIBC, IRON, RETICCTPCT in the last 72 hours. Urine analysis:    Component Value Date/Time   COLORURINE YELLOW 05/26/2016 0927   APPEARANCEUR CLEAR 05/26/2016 0927   LABSPEC 1.013 05/26/2016 0927   PHURINE 5.0 05/26/2016 0927   GLUCOSEU NEGATIVE 05/26/2016 0927   HGBUR NEGATIVE 05/26/2016 0927   BILIRUBINUR NEGATIVE 05/26/2016 0927   KETONESUR NEGATIVE 05/26/2016 0927   PROTEINUR NEGATIVE 05/26/2016 0927     NITRITE NEGATIVE 05/26/2016 0927   LEUKOCYTESUR NEGATIVE 05/26/2016 0927    Creatinine Clearance: Estimated Creatinine Clearance: 79.2 mL/min (by C-G formula based on SCr of 1.34 mg/dL (H)).  Sepsis Labs: @LABRCNTIP (procalcitonin:4,lacticidven:4) )No results found for this or any previous visit (from the past 240 hour(s)).   Radiological Exams on Admission: Ct Head Wo Contrast  Result Date: 05/26/2016 CLINICAL DATA:  Right leg numbness with decreased movement of right foot. EXAM: CT HEAD WITHOUT CONTRAST TECHNIQUE: Contiguous axial images were obtained from the base of the skull through the vertex without intravenous contrast. COMPARISON:  None. FINDINGS: Brain: No evidence of acute infarction, hemorrhage, hydrocephalus, extra-axial collection or mass lesion/mass effect. Vascular: No hyperdense vessel or unexpected calcification. Skull: Normal. Negative for fracture or focal lesion. Sinuses/Orbits: No acute finding. Other: None. IMPRESSION: Normal head CT. Electronically Signed   By: Irish Lack M.D.   On: 05/26/2016  15:21   Mr Thoracic Spine W Wo Contrast  Result Date: 05/26/2016 EXAM: MRI THORACIC AND LUMBAR SPINE WITHOUT AND WITH CONTRAST TECHNIQUE: Multiplanar and multiecho pulse sequences of the thoracic and lumbar spine were obtained without and with intravenous contrast. CONTRAST:  20mL MULTIHANCE GADOBENATE DIMEGLUMINE 529 MG/ML IV SOLN COMPARISON:  None. FINDINGS: MRI THORACIC SPINE FINDINGS Alignment:  Minimal curvature. Vertebrae: Mild edema within the right C7 facet possibly degenerative in origin. Cord: Abnormal signal within the cord extends from the lower T3 through the mid T10 level. Additionally, blood breakdown products are noted within the right aspect of the cord extending from the T5-6 level to the lower T9 level (maximal at the T7-8 level). Mild enhancement of the cord T6-7 through upper T8 level. Paraspinal and other soft tissues: Mild dilation thoracic aorta with  ascending thoracic aorta measuring up to 4.2 cm. Disc levels: C6-7: Moderate bulge/ osteophyte with mild spinal stenosis and minimal cord flattening. Axial images not obtained. Scattered minimal bulges throughout the thoracic region without large thoracic disc herniation causing cord compression. MRI LUMBAR SPINE FINDINGS Segmentation:  Last fully open disc space labeled L5-S1. Alignment:  Mild straightening and slight curvature. Vertebrae:  Endplate degenerative changes L4-5. Conus medullaris: Extends to the T12-L1 level. Paraspinal and other soft tissues: Tiny right renal cyst. Disc levels: L1-2:  Negative. L2-3: Mild facet degenerative changes. Minimal bulge greater left lateral position touching but not compressing exiting left L2 nerve root. L3-4: Bulge. Facet degenerative changes. Mild bilateral foraminal narrowing. Multifactorial mild spinal stenosis and lateral recess narrowing bilaterally. L4-5: Facet degenerative changes and ligamentum flavum hypertrophy greater on the right. Disc degeneration with disc space narrowing endplate reactive changes greater on the right. Bulge and osteophyte greater right foraminal/ lateral position encroaching upon exiting right L4 nerve root. Moderate narrowing right lateral thecal sac and right lateral recess. Mild spinal stenosis greater on right. L5-S1: Moderate facet degenerative changes. Bulge with osteophyte. Mild lateral recess narrowing. Mild foraminal narrowing greater on the left. IMPRESSION: MRI THORACIC SPINE Abnormal signal within the thoracic cord extends from the lower T3 through the mid T10 level suggestive of edema. Additionally, blood breakdown products are noted within the right aspect of the cord extending from the T5-6 level to the lower T9 level (maximal at the T7-8 level). Mild enhancement of the cord T6-7 through upper T8 level. Etiology of thoracic cord abnormality is indeterminate. Considerations include: Hemorrhagic transverse myelitis secondary to  viral trigger (infection or vaccination). Patient reports possible right-sided shingles in the past and therefore this may be related to such. Result of myelitis secondary to recent infection (such as influenza) with hemorrhagic conversion or ADEM with hemorrhagic conversion are considerations. Thoracic cord ischemia with hemorrhagic conversion. Given the fact the patient has a slightly dilated ascending thoracic aorta, CT angiogram of the chest abdomen and pelvis may be considered to exclude dissection not detected on the current exam. Vascular malformation such as a cavernoma with bleeding and surrounding edema. Although there is blush like enhancement, appearance is not typical for dural fistula as vessels are not seen along the periphery of the cord. Additionally, symptoms occurred more acutely than expected with dural fistula. Post inflammatory myelitis/vasculitis with bleeding (such as Behcet's disease, sarcoidosis, Wegener's, etc.). Tumor felt last likely consideration (ependymomas can bleed but usually enhance more). Metastatic disease felt less likely unless the patient has a known malignancy with hemorrhagic propensity such as melanoma. Neuromyelitis optica. Multiple sclerosis with hemorrhagic conversion felt less likely consideration. C6-7 moderate bulge/ osteophyte with  mild spinal stenosis and minimal cord flattening. Axial images not obtained. Scattered minimal bulges throughout the thoracic region without large thoracic disc herniation causing cord compression. Mild edema within the right C7 facet possibly degenerative in origin. MRI LUMBAR SPINE Multi-level degenerative changes most prominent L4-5 level as detailed above. These results were called by telephone at the time of interpretation on 05/26/2016 at 12:44 pm to Dr. Karma Ganja, who verbally acknowledged these results. Electronically Signed   By: Lacy Duverney M.D.   On: 05/26/2016 13:15   Mr Lumbar Spine W Wo Contrast  Result Date:  05/26/2016 EXAM: MRI THORACIC AND LUMBAR SPINE WITHOUT AND WITH CONTRAST TECHNIQUE: Multiplanar and multiecho pulse sequences of the thoracic and lumbar spine were obtained without and with intravenous contrast. CONTRAST:  20mL MULTIHANCE GADOBENATE DIMEGLUMINE 529 MG/ML IV SOLN COMPARISON:  None. FINDINGS: MRI THORACIC SPINE FINDINGS Alignment:  Minimal curvature. Vertebrae: Mild edema within the right C7 facet possibly degenerative in origin. Cord: Abnormal signal within the cord extends from the lower T3 through the mid T10 level. Additionally, blood breakdown products are noted within the right aspect of the cord extending from the T5-6 level to the lower T9 level (maximal at the T7-8 level). Mild enhancement of the cord T6-7 through upper T8 level. Paraspinal and other soft tissues: Mild dilation thoracic aorta with ascending thoracic aorta measuring up to 4.2 cm. Disc levels: C6-7: Moderate bulge/ osteophyte with mild spinal stenosis and minimal cord flattening. Axial images not obtained. Scattered minimal bulges throughout the thoracic region without large thoracic disc herniation causing cord compression. MRI LUMBAR SPINE FINDINGS Segmentation:  Last fully open disc space labeled L5-S1. Alignment:  Mild straightening and slight curvature. Vertebrae:  Endplate degenerative changes L4-5. Conus medullaris: Extends to the T12-L1 level. Paraspinal and other soft tissues: Tiny right renal cyst. Disc levels: L1-2:  Negative. L2-3: Mild facet degenerative changes. Minimal bulge greater left lateral position touching but not compressing exiting left L2 nerve root. L3-4: Bulge. Facet degenerative changes. Mild bilateral foraminal narrowing. Multifactorial mild spinal stenosis and lateral recess narrowing bilaterally. L4-5: Facet degenerative changes and ligamentum flavum hypertrophy greater on the right. Disc degeneration with disc space narrowing endplate reactive changes greater on the right. Bulge and osteophyte  greater right foraminal/ lateral position encroaching upon exiting right L4 nerve root. Moderate narrowing right lateral thecal sac and right lateral recess. Mild spinal stenosis greater on right. L5-S1: Moderate facet degenerative changes. Bulge with osteophyte. Mild lateral recess narrowing. Mild foraminal narrowing greater on the left. IMPRESSION: MRI THORACIC SPINE Abnormal signal within the thoracic cord extends from the lower T3 through the mid T10 level suggestive of edema. Additionally, blood breakdown products are noted within the right aspect of the cord extending from the T5-6 level to the lower T9 level (maximal at the T7-8 level). Mild enhancement of the cord T6-7 through upper T8 level. Etiology of thoracic cord abnormality is indeterminate. Considerations include: Hemorrhagic transverse myelitis secondary to viral trigger (infection or vaccination). Patient reports possible right-sided shingles in the past and therefore this may be related to such. Result of myelitis secondary to recent infection (such as influenza) with hemorrhagic conversion or ADEM with hemorrhagic conversion are considerations. Thoracic cord ischemia with hemorrhagic conversion. Given the fact the patient has a slightly dilated ascending thoracic aorta, CT angiogram of the chest abdomen and pelvis may be considered to exclude dissection not detected on the current exam. Vascular malformation such as a cavernoma with bleeding and surrounding edema. Although there is blush like  enhancement, appearance is not typical for dural fistula as vessels are not seen along the periphery of the cord. Additionally, symptoms occurred more acutely than expected with dural fistula. Post inflammatory myelitis/vasculitis with bleeding (such as Behcet's disease, sarcoidosis, Wegener's, etc.). Tumor felt last likely consideration (ependymomas can bleed but usually enhance more). Metastatic disease felt less likely unless the patient has a known  malignancy with hemorrhagic propensity such as melanoma. Neuromyelitis optica. Multiple sclerosis with hemorrhagic conversion felt less likely consideration. C6-7 moderate bulge/ osteophyte with mild spinal stenosis and minimal cord flattening. Axial images not obtained. Scattered minimal bulges throughout the thoracic region without large thoracic disc herniation causing cord compression. Mild edema within the right C7 facet possibly degenerative in origin. MRI LUMBAR SPINE Multi-level degenerative changes most prominent L4-5 level as detailed above. These results were called by telephone at the time of interpretation on 05/26/2016 at 12:44 pm to Dr. Karma Ganja, who verbally acknowledged these results. Electronically Signed   By: Lacy Duverney M.D.   On: 05/26/2016 13:15   Ct Angio Chest/abd/pel For Dissection W And/or Wo Contrast  Result Date: 05/26/2016 CLINICAL DATA:  Low back pain and abdominal pain. Evidence for thoracic cord ischemia with hemorrhagic conversion on recent MRI. CT angiogram was recommended to exclude an aortic dissection. EXAM: CT ANGIOGRAPHY CHEST, ABDOMEN AND PELVIS TECHNIQUE: Multidetector CT imaging through the chest, abdomen and pelvis was performed using the standard protocol during bolus administration of intravenous contrast. Multiplanar reconstructed images and MIPs were obtained and reviewed to evaluate the vascular anatomy. CONTRAST:  100 mL Isovue 370 COMPARISON:  Thoracic spine MRI 05/26/2016 FINDINGS: CTA CHEST FINDINGS Cardiovascular: The aortic root at the sinuses of Valsalva measures 4.5 cm. The mid ascending thoracic aorta is aneurysmal measuring up to 4.2 cm. 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 The great vessels are patent. Negative for an aortic dissection. Proximal descending thoracic aorta measures 3.2 cm. Pulmonary arteries are patent. Mediastinum/Nodes: No evidence for chest lymphadenopathy. No significant  pericardial fluid. Lungs/Pleura: Trachea and mainstem bronchi are patent. Focal pleural thickening or nodularity along the left major fissure on sequence 7, image 80 measuring up to 5 mm. Otherwise, the lungs are clear. No significant airspace disease or consolidation. No pleural effusions. Musculoskeletal: No acute bone abnormality. Review of the MIP images confirms the above findings. CTA ABDOMEN AND PELVIS FINDINGS VASCULAR Aorta: Normal caliber aorta without aneurysm, dissection, vasculitis or significant stenosis. Celiac: Patent without evidence of aneurysm, dissection, vasculitis or significant stenosis. SMA: Patent without evidence of aneurysm, dissection, vasculitis or significant stenosis. Renals: Both renal arteries are patent without evidence of aneurysm, dissection, vasculitis, fibromuscular dysplasia or significant stenosis. There is an accessory inferior left renal artery which is patent. IMA: Patent without evidence of aneurysm, dissection, vasculitis or significant stenosis. Inflow: Mild wall calcifications in the iliac arteries without significant stenosis. Internal and external iliac arteries are patent bilaterally. No dissection. Veins: No obvious venous abnormality within the limitations of this arterial phase study. Review of the MIP images confirms the above findings. NON-VASCULAR Hepatobiliary: No acute abnormality to the liver or gallbladder. Pancreas: Normal appearance of the pancreas without inflammation or duct dilatation. Spleen: Normal appearance of spleen without enlargement. Adrenals/Urinary Tract: Normal adrenal glands. Normal appearance of both kidneys without suspicious lesion or hydronephrosis. Urinary bladder is decompressed with a Foley catheter. Stomach/Bowel: There is a large amount of stool in the right colon and hepatic flexure. No evidence for bowel obstruction or focal inflammation.  Lymphatic: No significant lymph node enlargement in the abdomen and pelvis. Soft tissue in  the mesentery adjacent to loops of bowel on sequence 6, image 257 is nonspecific and may represent a few mesenteric lymph nodes. Reproductive: Prostate is unremarkable. Seminal vesicles are unremarkable. Other: No significant ascites in the abdomen or pelvis. Musculoskeletal: Mild retrolisthesis at L4-L5 with some disc space narrowing. Refer to recent spine MRI. Review of the MIP images confirms the above findings. IMPRESSION: Negative for an aortic dissection. No acute intrathoracic or intra-abdominal disease. 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 Ascending thoracic aorta measures up to 4.2 cm. Recommend annual imaging followup by CTA or MRA. This recommendation follows 2010 ACCF/AHA/AATS/ACR/ASA/SCA/SCAI/SIR/STS/SVM Guidelines for the Diagnosis and Management of Patients with Thoracic Aortic Disease. Circulation. 2010; 121: e266-e369 5 mm nodule along the left major fissure is indeterminate. No follow-up needed if patient is low-risk. Non-contrast chest CT can be considered in 12 months if patient is high-risk. This recommendation follows the consensus statement: Guidelines for Management of Incidental Pulmonary Nodules Detected on CT Images: From the Fleischner Society 2017; Radiology 2017; 284:228-243. Large amount of stool involving the right colon. Electronically Signed   By: Richarda Overlie M.D.   On: 05/26/2016 15:24    EKG: Pending  Assessment/Plan Principal Problem:   Right leg weakness Active Problems:   Myelitis (HCC)   Urinary retention   Hypertension   AKI (acute kidney injury) (HCC)   Constipation   #1. Right leg weakness/numbness tingling. Mri with thoracic cord abnormality. Concern for transverse myelitis. CT of the head without acute abnormalities. Neuro exam with significant right foot/leg weakness. Discussed with Dr Danielle Dess neurosurgery who agreed to consult. -Admit -Decadron IV -Neurosurgical consult -Gentle IV fluids -Keep nothing by mouth for now until  evaluated by neurosurgery  #2. Urinary retention. Patient recent onset of frequency and difficulty initiating urine flow. More likely neurogenic related to above vs obstruction. Prostate exam within the limits of normal. Foley inserted in the emergency department and immediately drained 1 L of clear urine -Continue Foley for 1 week then voiding trials -Monitor intake and output  #3. Hypertension. Poor control in the emergency department. Not on any antihypertensive medications. -When necessary hydralazine  4. Acute kidney injury. Creatinine 1.3. May be related to decreased oral intake in the setting of urinary retention -Hold nephrotoxins -Gentle IV fluids - continue Foley -measure urine output  #5. Constipation. Improved after treatment 2 days ago. States he hasn't had a bowel movement since that time. -stool softner     DVT prophylaxis: scd  Code Status: full  Family Communication: significant other at bedside  Disposition Plan: home  Consults called: dr Sherilyn Cooter elsner  Admission status: inpatient    Gwenyth Bender MD [facility]  If 7PM-7AM, please contact night-coverage [email or web address] Password [email or web address]  05/26/2016, 5:18 PM

## 2016-05-27 ENCOUNTER — Inpatient Hospital Stay (HOSPITAL_COMMUNITY): Payer: Medicaid Other

## 2016-05-27 DIAGNOSIS — N179 Acute kidney failure, unspecified: Secondary | ICD-10-CM

## 2016-05-27 LAB — CBC
HEMATOCRIT: 43.9 % (ref 39.0–52.0)
Hemoglobin: 15 g/dL (ref 13.0–17.0)
MCH: 29.3 pg (ref 26.0–34.0)
MCHC: 34.2 g/dL (ref 30.0–36.0)
MCV: 85.7 fL (ref 78.0–100.0)
Platelets: 254 10*3/uL (ref 150–400)
RBC: 5.12 MIL/uL (ref 4.22–5.81)
RDW: 12.9 % (ref 11.5–15.5)
WBC: 5.8 10*3/uL (ref 4.0–10.5)

## 2016-05-27 LAB — BASIC METABOLIC PANEL
Anion gap: 14 (ref 5–15)
BUN: 16 mg/dL (ref 6–20)
CHLORIDE: 98 mmol/L — AB (ref 101–111)
CO2: 23 mmol/L (ref 22–32)
Calcium: 10.1 mg/dL (ref 8.9–10.3)
Creatinine, Ser: 1.41 mg/dL — ABNORMAL HIGH (ref 0.61–1.24)
GFR calc Af Amer: >60 mL/min (ref >60)
GFR calc non Af Amer: 55 mL/min — ABNORMAL LOW (ref >60)
Glucose, Bld: 133 mg/dL — ABNORMAL HIGH (ref 65–99)
POTASSIUM: 5.1 mmol/L (ref 3.5–5.1)
SODIUM: 135 mmol/L (ref 135–145)

## 2016-05-27 MED ORDER — SODIUM CHLORIDE 0.9 % IV SOLN
1000.0000 mg | INTRAVENOUS | Status: AC
  Administered 2016-05-27 – 2016-05-31 (×5): 1000 mg via INTRAVENOUS
  Filled 2016-05-27 (×7): qty 8

## 2016-05-27 MED ORDER — GADOBENATE DIMEGLUMINE 529 MG/ML IV SOLN
20.0000 mL | Freq: Once | INTRAVENOUS | Status: AC | PRN
  Administered 2016-05-27: 20 mL via INTRAVENOUS

## 2016-05-27 MED ORDER — SODIUM CHLORIDE 0.9 % IV SOLN: INTRAVENOUS | Status: DC

## 2016-05-27 MED ORDER — DIAZEPAM 5 MG PO TABS
5.0000 mg | ORAL_TABLET | ORAL | Status: AC
  Administered 2016-05-27: 5 mg via ORAL
  Filled 2016-05-27: qty 1

## 2016-05-27 MED ORDER — SODIUM CHLORIDE 0.9 % IV SOLN
INTRAVENOUS | Status: DC
  Administered 2016-05-27 – 2016-05-30 (×6): via INTRAVENOUS

## 2016-05-27 MED ORDER — POLYETHYLENE GLYCOL 3350 17 G PO PACK
17.0000 g | PACK | Freq: Two times a day (BID) | ORAL | Status: DC
  Administered 2016-05-27 – 2016-05-30 (×7): 17 g via ORAL
  Filled 2016-05-27 (×7): qty 1

## 2016-05-27 MED ORDER — POLYETHYLENE GLYCOL 3350 17 G PO PACK: 17.0000 g | PACK | Freq: Every day | ORAL | Status: DC

## 2016-05-27 MED ORDER — SENNOSIDES-DOCUSATE SODIUM 8.6-50 MG PO TABS
1.0000 | ORAL_TABLET | Freq: Two times a day (BID) | ORAL | Status: DC
  Administered 2016-05-27 – 2016-06-01 (×10): 1 via ORAL
  Filled 2016-05-27 (×11): qty 1

## 2016-05-27 NOTE — Progress Notes (Signed)
Inpatient Rehabilitation  OT has evaluated pt. and is recommending IP rehab.  Patient was screened by Weldon PickingSusan Gardiner [place] for appropriateness for an Inpatient Acute Rehab consult.  At this time, we are recommending Inpatient Rehab consult.  Please order consult if you are agreeable.  Weldon PickingSusan Lun Muro PT Inpatient Rehab Admissions Coordinator Cell [phone] Office [phone]

## 2016-05-27 NOTE — Evaluation (Signed)
Physical Therapy Evaluation Patient Details Name: Aurea GraffJamie Gehling MRN: [id number] DOB: 09/14/1960 Today's Date: 05/27/2016   History of Present Illness  56 year old male who had noted some abdominal pain and constipation several days before and was seen in emergency room given some muscle relaxers but then again a half ago started to develop right lower extremity weakness. 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 There is some mass effect in the cord itself from this lesion. The rest of his cervical spine looks normal. The patient had an lumbar puncture pending.  Clinical Impression  Pt admitted with above diagnosis. Pt currently with functional limitations due to the deficits listed below (see PT Problem List). Pt with loss of strength and neuromuscular control BLE's, R>L. Unable to safely ambulate at this point, stedy used for bed to chair transfer. Pt very motivated to work to get better and is very active at baseline.  Pt will benefit from skilled PT to increase their independence and safety with mobility to allow discharge to the venue listed below.       Follow Up Recommendations CIR;Supervision/Assistance - 24 hour    Equipment Recommendations  Other (comment) (TBD)    Recommendations for Other Services Rehab consult     Precautions / Restrictions Precautions Precautions: Fall Restrictions Weight Bearing Restrictions: No      Mobility  Bed Mobility Overal bed mobility: Needs Assistance Bed Mobility: Supine to Sit     Supine to sit: Min guard     General bed mobility comments: heavy reliance on BUE strength and bedrails. Pt able to actively adduct RLE to move off bed. Also using his arms to move RLE  Transfers Overall transfer level: Needs assistance Equipment used: Ambulation equipment  used;Rolling walker (2 wheeled) Transfers: Sit to/from Stand Sit to Stand: Mod assist;+2 physical assistance         General transfer comment: practiced sit to stand multiple times to RW as well as stedy. Needed mod/ max A to stand with hands placed on bed. With stedy, pt able to pull self up with UE's with mod A +2. Min A to help control descent with sitting  Ambulation/Gait             General Gait Details: unable to ambulate safely  Stairs            Wheelchair Mobility    Modified Rankin (Stroke Patients Only)       Balance Overall balance assessment: Needs assistance Sitting-balance support: No upper extremity supported;Feet supported Sitting balance-Leahy Scale: Fair Sitting balance - Comments: pt able to maintain balance in unsupported sitting but was a struggle with UE motion and wt shifting, lack of trunk control evident and obviously new and disconcerting for pt   Standing balance support: Bilateral upper extremity supported Standing balance-Leahy Scale: Zero Standing balance comment: mod A +2 to maintain standing                             Pertinent Vitals/Pain Pain Assessment: No/denies pain    Home Living Family/patient expects to be discharged to:: Private residence Living Arrangements: Non-relatives/Friends Available Help at Discharge: Friend(s) Type of Home: House Home Access: Stairs to enter Entrance Stairs-Rails: Left Entrance Stairs-Number of Steps: 5 Home Layout: Two level;1/2 bath  on main level;Bed/bath upstairs Home Equipment: None Additional Comments: pt currently living with a roommate. Going through a divorce. 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 Played football at [facility]. Has a 54 yo son who lives with ex wife     Hand Dominance   Dominant Hand: Right    Extremity/Trunk  Assessment   Upper Extremity Assessment Upper Extremity Assessment: Defer to OT evaluation (had been having cramping in hands before session)    Lower Extremity Assessment Lower Extremity Assessment: LLE deficits/detail;RLE deficits/detail RLE Deficits / Details: no active motion at foot or ankle, knee ext 3/5 (buckles with WB'ing, lack of neuromuscular control noted), hip flex 1/5, hip adduction 2/5, hip abduction 2-/5 RLE Sensation:  [place]) RLE Coordination: decreased gross motor LLE Deficits / Details: hip flex 2/5, knee ext 3+/5 (also buckling in standing), decreased neuromuscular control as well.  LLE Coordination: decreased gross motor    Cervical / Trunk Assessment Cervical / Trunk Assessment: Normal Cervical / Trunk Exceptions: difficulty maintaining due to core weakness  Communication   Communication: No difficulties  Cognition Arousal/Alertness: Awake/alert Behavior During Therapy: WFL for tasks assessed/performed Overall Cognitive Status: Within Functional Limits for tasks assessed                 General Comments: scared about situation, appropriately    General Comments General comments (skin integrity, edema, etc.): may need PRAFO R ankle    Exercises Other Exercises Other Exercises: pt very motivated to get stronger and wanting to try to exercise in stedy where his knees were supported, attempting to bend and straighten knees but lacking control   Assessment/Plan    PT Assessment Patient needs continued PT services  PT Problem List Decreased strength;Decreased range of motion;Decreased activity tolerance;Decreased balance;Decreased mobility;Decreased coordination;Decreased knowledge of use of DME;Decreased knowledge of precautions          PT Treatment Interventions DME instruction;Gait training;Stair training;Functional mobility training;Therapeutic activities;Therapeutic exercise;Balance training;Patient/family education;Neuromuscular re-education     PT Goals (Current goals can be found in the Care Plan section)  Acute Rehab PT Goals Patient Stated Goal: to be able to walk and do for his self PT Goal Formulation: With patient Time For Goal Achievement: 06/10/16 Potential to Achieve Goals: Good    Frequency Min 4X/week   Barriers to discharge Inaccessible home environment;Decreased caregiver support stairs into home, stairs up to bed/ bath, lives with roommate. Unsure of other support he has    Co-evaluation PT/OT/SLP Co-Evaluation/Treatment: Yes Reason for Co-Treatment: Complexity of the patient's impairments (multi-system involvement);For patient/therapist safety PT goals addressed during session: Mobility/safety with mobility;Balance;Proper use of DME;Strengthening/ROM         End of Session Equipment Utilized During Treatment: Gait belt Activity Tolerance: Patient tolerated treatment well Patient left: in chair;with call bell/phone within reach Nurse Communication: Mobility status;Need for lift equipment         Time: [phone] PT Time Calculation (min) (ACUTE ONLY): 26 min   Charges:   PT Evaluation $PT Eval Moderate Complexity: 1 Procedure     PT G Codes:      Lyanne Co, PT  Acute Rehab Services  [phone]   Josephine L Raneisha Bress 05/27/2016, 2:20 PM

## 2016-05-27 NOTE — Consult Note (Signed)
Reason for Consult: abnormal MRI t-spine Referring Physician: Hospitalist  Perry Morrison is an 56 y.o. male.  HPI: The patient presented 4 days ago to the ED as he had noted some abdominal pain and constipation.  He was given some muscle relaxers and sent home.   About 3 days ago he started to notice some right leg weakness but thought it might be from the muscle relaxers so he did not come in.  However, the right leg weakness got progressively worse and he decided to come in to the ED yesterday.  While in the ED yesterday he had an MRI of the thoracic and lumbar spine which was very abnormal: it demonstrated swelling in the midportion of the cord at T5 level with some right-sided lesion consistent with hemorrhage within the cord down to the level of T9. There is some mass effect in the cord itself from this lesion. The patient also had an lumbar puncture which showed increased RBC and protein but normal WBC and glucose.    Past Medical History:  Diagnosis Date  . AKI (acute kidney injury) ([place])   . 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 Hypertension   . Myelitis ([place])   . Right leg weakness   . Urinary retention     History reviewed. No pertinent surgical history.  Family History  Problem Relation Age of Onset  . Hypertension Mother   . AAA (abdominal aortic aneurysm) Mother     Social History:  reports that he has never smoked. He has never used smokeless tobacco. He reports that he does not drink alcohol or use drugs.  Allergies: No Known Allergies  Medications: I have reviewed the patient's current medications.  Results for orders placed or performed during the hospital encounter of 05/26/16 (from the past 48 hour(s))  Urinalysis, Routine w reflex microscopic     Status: None   Collection Time: 05/26/16  9:27 AM  Result Value Ref Range   Color, Urine YELLOW YELLOW   APPearance CLEAR CLEAR   Specific Gravity, Urine 1.013 1.005 - 1.030   pH 5.0  5.0 - 8.0   Glucose, UA NEGATIVE NEGATIVE mg/dL   Hgb urine dipstick NEGATIVE NEGATIVE   Bilirubin Urine NEGATIVE NEGATIVE   Ketones, ur NEGATIVE NEGATIVE mg/dL   Protein, ur NEGATIVE NEGATIVE mg/dL   Nitrite NEGATIVE NEGATIVE   Leukocytes, UA NEGATIVE NEGATIVE  CBC with Differential/Platelet     Status: None   Collection Time: 05/26/16  9:28 AM  Result Value Ref Range   WBC 6.1 4.0 - 10.5 K/uL   RBC 5.09 4.22 - 5.81 MIL/uL   Hemoglobin 15.1 13.0 - 17.0 g/dL   HCT 43.6 39.0 - 52.0 %   MCV 85.7 78.0 - 100.0 fL   MCH 29.7 26.0 - 34.0 pg   MCHC 34.6 30.0 - 36.0 g/dL   RDW 12.8 11.5 - 15.5 %   Platelets 231 150 - 400 K/uL   Neutrophils Relative % 50 %   Neutro Abs 3.0 1.7 - 7.7 K/uL   Lymphocytes Relative 40 %   Lymphs Abs 2.4 0.7 - 4.0 K/uL   Monocytes Relative 8 %   Monocytes Absolute 0.5 0.1 - 1.0 K/uL   Eosinophils Relative 2 %   Eosinophils Absolute 0.1 0.0 - 0.7 K/uL   Basophils Relative 0 %   Basophils Absolute 0.0 0.0 - 0.1 K/uL  Basic metabolic panel     Status: Abnormal   Collection Time: 05/26/16  9:28 AM  Result Value Ref Range   Sodium 135 135 - 145 mmol/L   Potassium 4.5 3.5 - 5.1 mmol/L   Chloride 100 (L) 101 - 111 mmol/L   CO2 27 22 - 32 mmol/L   Glucose, Bld 112 (H) 65 - 99 mg/dL   BUN 13 6 - 20 mg/dL   Creatinine, Ser 1.34 (H) 0.61 - 1.24 mg/dL   Calcium 10.0 8.9 - 10.3 mg/dL   GFR calc non Af Amer 59 (L) >60 mL/min   GFR calc Af Amer >60 >60 mL/min    Comment: (NOTE) The eGFR has been calculated using the CKD EPI equation. This calculation has not been validated in all clinical situations. eGFR's persistently <60 mL/min signify possible Chronic Kidney Disease.    Anion gap 8 5 - 15  CSF cell count with differential collection tube #: 1     Status: Abnormal   Collection Time: 05/26/16  1:54 PM  Result Value Ref Range   Tube # 1    Color, CSF COLORLESS COLORLESS   Appearance, CSF CLEAR CLEAR   Supernatant NOT INDICATED    RBC Count, CSF 610 (H) 0  /cu mm   WBC, CSF 1 0 - 5 /cu mm   Other Cells, CSF TOO FEW TO COUNT, SMEAR AVAILABLE FOR REVIEW     Comment: Few lymphs, rare monos.  CSF cell count with differential collection tube #: 4     Status: Abnormal   Collection Time: 05/26/16  1:54 PM  Result Value Ref Range   Tube # 4    Color, CSF COLORLESS COLORLESS   Appearance, CSF CLEAR CLEAR   Supernatant NOT INDICATED    RBC Count, CSF 680 (H) 0 /cu mm   WBC, CSF 2 0 - 5 /cu mm   Other Cells, CSF TOO FEW TO COUNT, SMEAR AVAILABLE FOR REVIEW     Comment: Few lymphs, rare segs & monos.  Glucose, CSF     Status: None   Collection Time: 05/26/16  1:54 PM  Result Value Ref Range   Glucose, CSF 56 40 - 70 mg/dL  Protein, CSF     Status: Abnormal   Collection Time: 05/26/16  1:54 PM  Result Value Ref Range   Total  Protein, CSF 81 (H) 15 - 45 mg/dL  CSF culture     Status: None (Preliminary result)   Collection Time: 05/26/16  4:00 PM  Result Value Ref Range   Specimen Description CSF    Special Requests NONE    Gram Stain      CYTOSPIN SMEAR WBC PRESENT,BOTH PMN AND MONONUCLEAR RBC PRESENT NO ORGANISMS SEEN    Culture NO GROWTH < 24 HOURS    Report Status PENDING   Basic metabolic panel     Status: Abnormal   Collection Time: 05/27/16  3:48 AM  Result Value Ref Range   Sodium 135 135 - 145 mmol/L   Potassium 5.1 3.5 - 5.1 mmol/L   Chloride 98 (L) 101 - 111 mmol/L   CO2 23 22 - 32 mmol/L   Glucose, Bld 133 (H) 65 - 99 mg/dL   BUN 16 6 - 20 mg/dL   Creatinine, Ser 1.41 (H) 0.61 - 1.24 mg/dL   Calcium 10.1 8.9 - 10.3 mg/dL   GFR calc non Af Amer 55 (L) >60 mL/min   GFR calc Af Amer >60 >60 mL/min    Comment: (NOTE) The eGFR has been calculated using the CKD EPI equation. This calculation has  not been validated in all clinical situations. eGFR's persistently <60 mL/min signify possible Chronic Kidney Disease.    Anion gap 14 5 - 15  CBC     Status: None   Collection Time: 05/27/16  3:48 AM  Result Value Ref Range    WBC 5.8 4.0 - 10.5 K/uL   RBC 5.12 4.22 - 5.81 MIL/uL   Hemoglobin 15.0 13.0 - 17.0 g/dL   HCT 43.9 39.0 - 52.0 %   MCV 85.7 78.0 - 100.0 fL   MCH 29.3 26.0 - 34.0 pg   MCHC 34.2 30.0 - 36.0 g/dL   RDW 12.9 11.5 - 15.5 %   Platelets 254 150 - 400 K/uL    Ct Head Wo Contrast  Result Date: 05/26/2016 CLINICAL DATA:  Right leg numbness with decreased movement of right foot. EXAM: CT HEAD WITHOUT CONTRAST TECHNIQUE: Contiguous axial images were obtained from the base of the skull through the vertex without intravenous contrast. COMPARISON:  None. FINDINGS: Brain: No evidence of acute infarction, hemorrhage, hydrocephalus, extra-axial collection or mass lesion/mass effect. Vascular: No hyperdense vessel or unexpected calcification. Skull: Normal. Negative for fracture or focal lesion. Sinuses/Orbits: No acute finding. Other: None. IMPRESSION: Normal head CT. Electronically Signed   By: Aletta Edouard M.D.   On: 05/26/2016 15:21   Mr Thoracic Spine W Wo Contrast  Result Date: 05/26/2016 EXAM: MRI THORACIC AND LUMBAR SPINE WITHOUT AND WITH CONTRAST TECHNIQUE: Multiplanar and multiecho pulse sequences of the thoracic and lumbar spine were obtained without and with intravenous contrast. CONTRAST:  60m MULTIHANCE GADOBENATE DIMEGLUMINE 529 MG/ML IV SOLN COMPARISON:  None. FINDINGS: MRI THORACIC SPINE FINDINGS Alignment:  Minimal curvature. Vertebrae: Mild edema within the right C7 facet possibly degenerative in origin. Cord: Abnormal signal within the cord extends from the lower T3 through the mid T10 level. Additionally, blood breakdown products are noted within the right aspect of the cord extending from the T5-6 level to the lower T9 level (maximal at the T7-8 level). Mild enhancement of the cord T6-7 through upper T8 level. Paraspinal and other soft tissues: Mild dilation thoracic aorta with ascending thoracic aorta measuring up to 4.2 cm. Disc levels: C6-7: Moderate bulge/ osteophyte with mild spinal  stenosis and minimal cord flattening. Axial images not obtained. Scattered minimal bulges throughout the thoracic region without large thoracic disc herniation causing cord compression. MRI LUMBAR SPINE FINDINGS Segmentation:  Last fully open disc space labeled L5-S1. Alignment:  Mild straightening and slight curvature. Vertebrae:  Endplate degenerative changes L4-5. Conus medullaris: Extends to the T12-L1 level. Paraspinal and other soft tissues: Tiny right renal cyst. Disc levels: L1-2:  Negative. L2-3: Mild facet degenerative changes. Minimal bulge greater left lateral position touching but not compressing exiting left L2 nerve root. L3-4: Bulge. Facet degenerative changes. Mild bilateral foraminal narrowing. Multifactorial mild spinal stenosis and lateral recess narrowing bilaterally. L4-5: Facet degenerative changes and ligamentum flavum hypertrophy greater on the right. Disc degeneration with disc space narrowing endplate reactive changes greater on the right. Bulge and osteophyte greater right foraminal/ lateral position encroaching upon exiting right L4 nerve root. Moderate narrowing right lateral thecal sac and right lateral recess. Mild spinal stenosis greater on right. L5-S1: Moderate facet degenerative changes. Bulge with osteophyte. Mild lateral recess narrowing. Mild foraminal narrowing greater on the left. IMPRESSION: MRI THORACIC SPINE Abnormal signal within the thoracic cord extends from the lower T3 through the mid T10 level suggestive of edema. Additionally, blood breakdown products are noted within the right aspect of the cord extending  from the T5-6 level to the lower T9 level (maximal at the T7-8 level). Mild enhancement of the cord T6-7 through upper T8 level. Etiology of thoracic cord abnormality is indeterminate. Considerations include: Hemorrhagic transverse myelitis secondary to viral trigger (infection or vaccination). Patient reports possible right-sided shingles in the past and  therefore this may be related to such. Result of myelitis secondary to recent infection (such as influenza) with hemorrhagic conversion or ADEM with hemorrhagic conversion are considerations. Thoracic cord ischemia with hemorrhagic conversion. Given the fact the patient has a slightly dilated ascending thoracic aorta, CT angiogram of the chest abdomen and pelvis may be considered to exclude dissection not detected on the current exam. Vascular malformation such as a cavernoma with bleeding and surrounding edema. Although there is blush like enhancement, appearance is not typical for dural fistula as vessels are not seen along the periphery of the cord. Additionally, symptoms occurred more acutely than expected with dural fistula. Post inflammatory myelitis/vasculitis with bleeding (such as Behcet's disease, sarcoidosis, Wegener's, etc.). Tumor felt last likely consideration (ependymomas can bleed but usually enhance more). Metastatic disease felt less likely unless the patient has a known malignancy with hemorrhagic propensity such as melanoma. Neuromyelitis optica. Multiple sclerosis with hemorrhagic conversion felt less likely consideration. C6-7 moderate bulge/ osteophyte with mild spinal stenosis and minimal cord flattening. Axial images not obtained. Scattered minimal bulges throughout the thoracic region without large thoracic disc herniation causing cord compression. Mild edema within the right C7 facet possibly degenerative in origin. MRI LUMBAR SPINE Multi-level degenerative changes most prominent L4-5 level as detailed above. These results were called by telephone at the time of interpretation on 05/26/2016 at 12:44 pm to Dr. Canary Brim, who verbally acknowledged these results. Electronically Signed   By: Genia Del M.D.   On: 05/26/2016 13:15   Mr Lumbar Spine W Wo Contrast  Result Date: 05/26/2016 EXAM: MRI THORACIC AND LUMBAR SPINE WITHOUT AND WITH CONTRAST TECHNIQUE: Multiplanar and multiecho pulse  sequences of the thoracic and lumbar spine were obtained without and with intravenous contrast. CONTRAST:  58m MULTIHANCE GADOBENATE DIMEGLUMINE 529 MG/ML IV SOLN COMPARISON:  None. FINDINGS: MRI THORACIC SPINE FINDINGS Alignment:  Minimal curvature. Vertebrae: Mild edema within the right C7 facet possibly degenerative in origin. Cord: Abnormal signal within the cord extends from the lower T3 through the mid T10 level. Additionally, blood breakdown products are noted within the right aspect of the cord extending from the T5-6 level to the lower T9 level (maximal at the T7-8 level). Mild enhancement of the cord T6-7 through upper T8 level. Paraspinal and other soft tissues: Mild dilation thoracic aorta with ascending thoracic aorta measuring up to 4.2 cm. Disc levels: C6-7: Moderate bulge/ osteophyte with mild spinal stenosis and minimal cord flattening. Axial images not obtained. Scattered minimal bulges throughout the thoracic region without large thoracic disc herniation causing cord compression. MRI LUMBAR SPINE FINDINGS Segmentation:  Last fully open disc space labeled L5-S1. Alignment:  Mild straightening and slight curvature. Vertebrae:  Endplate degenerative changes L4-5. Conus medullaris: Extends to the T12-L1 level. Paraspinal and other soft tissues: Tiny right renal cyst. Disc levels: L1-2:  Negative. L2-3: Mild facet degenerative changes. Minimal bulge greater left lateral position touching but not compressing exiting left L2 nerve root. L3-4: Bulge. Facet degenerative changes. Mild bilateral foraminal narrowing. Multifactorial mild spinal stenosis and lateral recess narrowing bilaterally. L4-5: Facet degenerative changes and ligamentum flavum hypertrophy greater on the right. Disc degeneration with disc space narrowing endplate reactive changes greater on the right.  Bulge and osteophyte greater right foraminal/ lateral position encroaching upon exiting right L4 nerve root. Moderate narrowing right  lateral thecal sac and right lateral recess. Mild spinal stenosis greater on right. L5-S1: Moderate facet degenerative changes. Bulge with osteophyte. Mild lateral recess narrowing. Mild foraminal narrowing greater on the left. IMPRESSION: MRI THORACIC SPINE Abnormal signal within the thoracic cord extends from the lower T3 through the mid T10 level suggestive of edema. Additionally, blood breakdown products are noted within the right aspect of the cord extending from the T5-6 level to the lower T9 level (maximal at the T7-8 level). Mild enhancement of the cord T6-7 through upper T8 level. Etiology of thoracic cord abnormality is indeterminate. Considerations include: Hemorrhagic transverse myelitis secondary to viral trigger (infection or vaccination). Patient reports possible right-sided shingles in the past and therefore this may be related to such. Result of myelitis secondary to recent infection (such as influenza) with hemorrhagic conversion or ADEM with hemorrhagic conversion are considerations. Thoracic cord ischemia with hemorrhagic conversion. Given the fact the patient has a slightly dilated ascending thoracic aorta, CT angiogram of the chest abdomen and pelvis may be considered to exclude dissection not detected on the current exam. Vascular malformation such as a cavernoma with bleeding and surrounding edema. Although there is blush like enhancement, appearance is not typical for dural fistula as vessels are not seen along the periphery of the cord. Additionally, symptoms occurred more acutely than expected with dural fistula. Post inflammatory myelitis/vasculitis with bleeding (such as Behcet's disease, sarcoidosis, Wegener's, etc.). Tumor felt last likely consideration (ependymomas can bleed but usually enhance more). Metastatic disease felt less likely unless the patient has a known malignancy with hemorrhagic propensity such as melanoma. Neuromyelitis optica. Multiple sclerosis with hemorrhagic  conversion felt less likely consideration. C6-7 moderate bulge/ osteophyte with mild spinal stenosis and minimal cord flattening. Axial images not obtained. Scattered minimal bulges throughout the thoracic region without large thoracic disc herniation causing cord compression. Mild edema within the right C7 facet possibly degenerative in origin. MRI LUMBAR SPINE Multi-level degenerative changes most prominent L4-5 level as detailed above. These results were called by telephone at the time of interpretation on 05/26/2016 at 12:44 pm to Dr. Canary Brim, who verbally acknowledged these results. Electronically Signed   By: Genia Del M.D.   On: 05/26/2016 13:15   Ct Angio Chest/abd/pel For Dissection W And/or Wo Contrast  Result Date: 05/26/2016 CLINICAL DATA:  Low back pain and abdominal pain. Evidence for thoracic cord ischemia with hemorrhagic conversion on recent MRI. CT angiogram was recommended to exclude an aortic dissection. EXAM: CT ANGIOGRAPHY CHEST, ABDOMEN AND PELVIS TECHNIQUE: Multidetector CT imaging through the chest, abdomen and pelvis was performed using the standard protocol during bolus administration of intravenous contrast. Multiplanar reconstructed images and MIPs were obtained and reviewed to evaluate the vascular anatomy. CONTRAST:  100 mL Isovue 370 COMPARISON:  Thoracic spine MRI 05/26/2016 FINDINGS: CTA CHEST FINDINGS Cardiovascular: The aortic root at the sinuses of Valsalva measures 4.5 cm. The mid ascending thoracic aorta is aneurysmal measuring up to 4.2 cm. 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 The great vessels are patent. Negative for an aortic dissection. Proximal descending thoracic aorta measures 3.2 cm. Pulmonary arteries are patent. Mediastinum/Nodes: No evidence for chest lymphadenopathy. No significant pericardial fluid. Lungs/Pleura: Trachea and mainstem bronchi are patent. Focal pleural thickening or nodularity along  the left major fissure on sequence 7, image 80 measuring up to 5 mm. Otherwise, the  lungs are clear. No significant airspace disease or consolidation. No pleural effusions. Musculoskeletal: No acute bone abnormality. Review of the MIP images confirms the above findings. CTA ABDOMEN AND PELVIS FINDINGS VASCULAR Aorta: Normal caliber aorta without aneurysm, dissection, vasculitis or significant stenosis. Celiac: Patent without evidence of aneurysm, dissection, vasculitis or significant stenosis. SMA: Patent without evidence of aneurysm, dissection, vasculitis or significant stenosis. Renals: Both renal arteries are patent without evidence of aneurysm, dissection, vasculitis, fibromuscular dysplasia or significant stenosis. There is an accessory inferior left renal artery which is patent. IMA: Patent without evidence of aneurysm, dissection, vasculitis or significant stenosis. Inflow: Mild wall calcifications in the iliac arteries without significant stenosis. Internal and external iliac arteries are patent bilaterally. No dissection. Veins: No obvious venous abnormality within the limitations of this arterial phase study. Review of the MIP images confirms the above findings. NON-VASCULAR Hepatobiliary: No acute abnormality to the liver or gallbladder. Pancreas: Normal appearance of the pancreas without inflammation or duct dilatation. Spleen: Normal appearance of spleen without enlargement. Adrenals/Urinary Tract: Normal adrenal glands. Normal appearance of both kidneys without suspicious lesion or hydronephrosis. Urinary bladder is decompressed with a Foley catheter. Stomach/Bowel: There is a large amount of stool in the right colon and hepatic flexure. No evidence for bowel obstruction or focal inflammation. Lymphatic: No significant lymph node enlargement in the abdomen and pelvis. Soft tissue in the mesentery adjacent to loops of bowel on sequence 6, image 257 is nonspecific and may represent a few mesenteric  lymph nodes. Reproductive: Prostate is unremarkable. Seminal vesicles are unremarkable. Other: No significant ascites in the abdomen or pelvis. Musculoskeletal: Mild retrolisthesis at L4-L5 with some disc space narrowing. Refer to recent spine MRI. Review of the MIP images confirms the above findings. IMPRESSION: Negative for an aortic dissection. No acute intrathoracic or intra-abdominal disease. 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 Ascending thoracic aorta measures up to 4.2 cm. Recommend annual imaging followup by CTA or MRA. This recommendation follows 2010 ACCF/AHA/AATS/ACR/ASA/SCA/SCAI/SIR/STS/SVM Guidelines for the Diagnosis and Management of Patients with Thoracic Aortic Disease. Circulation. 2010; 121: [id number] 5 mm nodule along the left major fissure is indeterminate. No follow-up needed if patient is low-risk. Non-contrast chest CT can be considered in 12 months if patient is high-risk. This recommendation follows the consensus statement: Guidelines for Management of Incidental Pulmonary Nodules Detected on CT Images: From the Fleischner Society 2017; Radiology 2017; 284:228-243. Large amount of stool involving the right colon. Electronically Signed   By: Markus Daft M.D.   On: 05/26/2016 15:24    ROS  Denies HA or visual disturbance  Blood pressure 127/83, pulse 90, temperature 98.4 F (36.9 C), temperature source Oral, resp. rate 20, height 6' 1"  (1.854 m), weight 102.1 kg (225 lb), SpO2 99 %. Physical Exam No distress  NEURO MS - normal CN - intact Motor - profound weakness of right leg from hip down to toes; intact in other limbs DTR - absent in right leg Sensory - intact in legs but has sensory band in lower thorax around T9-T10 [place] - normal in arms and left leg Station/gait - not tested  Assessment/Plan: 1. 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  The etiology remains unclear but it is most likely either a spinal cord  infarct vs vascular malformation; in either case there is associated hemorrhage.  It is unlikely he has transverse myelitis given that the CSF shows no white cells.  Will get MRI brain, c-spine to look for evidence  of other vascular malformations or inflammatory disorders.  Will also get MRA of the spine to look for vascular disease of the spine.  Will also try empiric steroids while awaiting studies given he has profound weakness and no real improvement so far.  Continue with PT/OT and supportive care.  Patient will need acute rehab.  Dr. Lawana Pai [facility] [phone]  05/27/2016, 12:20 PM

## 2016-05-27 NOTE — Care Management Note (Signed)
Case Management Note  Patient Details  Name: Perry Morrison MRN: [id number] Date of Birth: 09/03/1960  Subjective/Objective:                  Patient was admitted with abdominal pain/constipation/right leg weakness/urinary retention .  CM will follow for discharge needs pending patient's progress and physician orders.   Action/Plan:   Expected Discharge Date:                  Expected Discharge Plan:     In-House Referral:     Discharge planning Services     Post Acute Care Choice:    Choice offered to:     DME Arranged:    DME Agency:     HH Arranged:    HH Agency:     Status of Service:     If discussed at [employer] Length [employer] Stay Meetings, dates discussed:    Additional Comments:  Anda KraftRobarge, Kenadee Gates C, RN 05/27/2016, 11:46 AM

## 2016-05-27 NOTE — Progress Notes (Signed)
[facility]                                                                              Patient Demographics  Perry Morrison, is a 56 y.o. male, DOB - 1960-06-11, ZOX:[id number]  Admit date - 05/26/2016   Admitting Physician Ozella Rocks, MD  Outpatient Primary MD for the patient is No PCP Per Patient  Outpatient specialists:   LOS - 1  days    Chief Complaint  Patient presents with  . Leg Pain  . Abdominal Pain  . Urinary Frequency       Brief summary   Perry Morrison is a 56 y.o. male with medical history significant for hypertension this emergency Department chief complaint of persistent abdominal pain and sudden onset right foot leg weakness/numbness tingling.  MRI of the T-spine was concerning for abnormal signal within the thoracic cord extending from lower T3 through the mid T10 level suggestive of edema of indeterminate etiology.  Assessment & Plan    Principal Problem:  Right leg weakness/numbness tingling - MRI of the T-spine was concerning for abnormal signal within the thoracic cord extending from lower T3 through the mid T10 level suggestive of edema of indeterminate etiology. - Neurosurgery was consulted and seen by Dr. Danielle Dess, did not think neurosurgical issue, recommended IV steroids, PTOT - Discussed with neurology, Dr Benedict Needy recommended angiogram to look for vascular disease, PTOT and supportive care and continue empiric steroids while awaiting the studies. Per neurology unlikely he has transverse myelitis given the CSF studies shows no white cells. - Patient will likely need acute rehabilitation.  Active problems  Urinary retention. Patient recent onset of frequency and difficulty initiating urine flow. More likely neurogenic related to above vs obstruction. Prostate exam within the limits of normal. Foley inserted in the emergency department and immediately drained 1 L of clear urine -Continue Foley for 1 week then voiding  trials -Monitor intake and output  Hypertension.  - Currently stable Not on any antihypertensive medications. -Continue PRN hydralazine   Acute kidney injury. Creatinine 1.3. May be related to decreased oral intake in the setting of urinary retention - Hold nephrotoxins - Continue IV fluid hydration, follow renal ultrasound - continue Foley - measure urine output  Constipation. Improved after treatment 2 days ago. States he hasn't had a bowel movement since that time. -Placed on bowel regimen   Code Status: Full CODE STATUS DVT Prophylaxis:   SCD's Family Communication: Discussed in detail with the patient, all imaging results, lab results explained to the patient    Disposition Plan:   Time Spent in minutes   25 minutes  Procedures:    Consultants:   Neurosurgery Neurology  Antimicrobials:      Medications  Scheduled Meds: . diazepam  5 mg Oral On Call  . methylPREDNISolone (SOLU-MEDROL) injection  1,000 mg Intravenous Q24H  . polyethylene glycol  17 g Oral BID  . senna-docusate  1 tablet Oral BID   Continuous Infusions: . sodium chloride     PRN Meds:.acetaminophen **OR** acetaminophen, hydrALAZINE, HYDROcodone-acetaminophen, ondansetron **OR** ondansetron (ZOFRAN) IV   Antibiotics   Anti-infectives    None  Subjective:   Perry Morrison was seen and examined today. Continues to have right lower extremity weakness, constipation for last 2 days. Patient denies dizziness, chest pain, shortness of breath, abdominal pain, N/V/D/C.Marland Kitchen No acute events overnight.    Objective:   Vitals:   05/26/16 2141 05/27/16 0056 05/27/16 0445 05/27/16 0943  BP: (!) 143/85 126/74 (!) 143/84 127/83  Pulse: 81 76 72 90  Resp: 16 16 16 20   Temp: 97.9 F (36.6 C) 97.8 F (36.6 C) 98.3 F (36.8 C) 98.4 F (36.9 C)  TempSrc: Oral Oral Oral Oral  SpO2: 98% 99% 98% 99%  Weight:      Height:        Intake/Output Summary (Last 24 hours) at 05/27/16  1300 Last data filed at 05/27/16 0741  Gross per 24 hour  Intake            957.5 ml  Output             3600 ml  Net          -2642.5 ml     Wt Readings from Last 3 Encounters:  05/26/16 102.1 kg (225 lb)     Exam  General: Alert and oriented x 3, NAD  HEENT:    Neck:   Cardiovascular: S1 S2 auscultated, no rubs, murmurs or gallops. Regular rate and rhythm.  Respiratory: Clear to auscultation bilaterally, no wheezing, rales or rhonchi  Gastrointestinal: Soft, nontender, nondistended, + bowel sounds  Ext: no cyanosis clubbing or edema  Neuro: AAOx3, Cr N's II- XII. Strength 5/5 LUE, LLE, RLE 0/5, RUE 5/5  Skin: No rashes  Psych: Normal affect and demeanor, alert and oriented x3    Data Reviewed:  I have personally reviewed following labs and imaging studies  Micro Results Recent Results (from the past 240 hour(s))  CSF culture     Status: None (Preliminary result)   Collection Time: 05/26/16  4:00 PM  Result Value Ref Range Status   Specimen Description CSF  Final   Special Requests NONE  Final   Gram Stain   Final    CYTOSPIN SMEAR WBC PRESENT,BOTH PMN AND MONONUCLEAR RBC PRESENT NO ORGANISMS SEEN    Culture NO GROWTH < 24 HOURS  Final   Report Status PENDING  Incomplete    Radiology Reports Ct Head Wo Contrast  Result Date: 05/26/2016 CLINICAL DATA:  Right leg numbness with decreased movement of right foot. EXAM: CT HEAD WITHOUT CONTRAST TECHNIQUE: Contiguous axial images were obtained from the base of the skull through the vertex without intravenous contrast. COMPARISON:  None. FINDINGS: Brain: No evidence of acute infarction, hemorrhage, hydrocephalus, extra-axial collection or mass lesion/mass effect. Vascular: No hyperdense vessel or unexpected calcification. Skull: Normal. Negative for fracture or focal lesion. Sinuses/Orbits: No acute finding. Other: None. IMPRESSION: Normal head CT. Electronically Signed   By: Irish Lack M.D.   On: 05/26/2016  15:21   Mr Thoracic Spine W Wo Contrast  Result Date: 05/26/2016 EXAM: MRI THORACIC AND LUMBAR SPINE WITHOUT AND WITH CONTRAST TECHNIQUE: Multiplanar and multiecho pulse sequences of the thoracic and lumbar spine were obtained without and with intravenous contrast. CONTRAST:  20mL MULTIHANCE GADOBENATE DIMEGLUMINE 529 MG/ML IV SOLN COMPARISON:  None. FINDINGS: MRI THORACIC SPINE FINDINGS Alignment:  Minimal curvature. Vertebrae: Mild edema within the right C7 facet possibly degenerative in origin. Cord: Abnormal signal within the cord extends from the lower T3 through the mid T10 level. Additionally, blood breakdown products are noted within the right aspect of  the cord extending from the T5-6 level to the lower T9 level (maximal at the T7-8 level). Mild enhancement of the cord T6-7 through upper T8 level. Paraspinal and other soft tissues: Mild dilation thoracic aorta with ascending thoracic aorta measuring up to 4.2 cm. Disc levels: C6-7: Moderate bulge/ osteophyte with mild spinal stenosis and minimal cord flattening. Axial images not obtained. Scattered minimal bulges throughout the thoracic region without large thoracic disc herniation causing cord compression. MRI LUMBAR SPINE FINDINGS Segmentation:  Last fully open disc space labeled L5-S1. Alignment:  Mild straightening and slight curvature. Vertebrae:  Endplate degenerative changes L4-5. Conus medullaris: Extends to the T12-L1 level. Paraspinal and other soft tissues: Tiny right renal cyst. Disc levels: L1-2:  Negative. L2-3: Mild facet degenerative changes. Minimal bulge greater left lateral position touching but not compressing exiting left L2 nerve root. L3-4: Bulge. Facet degenerative changes. Mild bilateral foraminal narrowing. Multifactorial mild spinal stenosis and lateral recess narrowing bilaterally. L4-5: Facet degenerative changes and ligamentum flavum hypertrophy greater on the right. Disc degeneration with disc space narrowing endplate  reactive changes greater on the right. Bulge and osteophyte greater right foraminal/ lateral position encroaching upon exiting right L4 nerve root. Moderate narrowing right lateral thecal sac and right lateral recess. Mild spinal stenosis greater on right. L5-S1: Moderate facet degenerative changes. Bulge with osteophyte. Mild lateral recess narrowing. Mild foraminal narrowing greater on the left. IMPRESSION: MRI THORACIC SPINE Abnormal signal within the thoracic cord extends from the lower T3 through the mid T10 level suggestive of edema. Additionally, blood breakdown products are noted within the right aspect of the cord extending from the T5-6 level to the lower T9 level (maximal at the T7-8 level). Mild enhancement of the cord T6-7 through upper T8 level. Etiology of thoracic cord abnormality is indeterminate. Considerations include: Hemorrhagic transverse myelitis secondary to viral trigger (infection or vaccination). Patient reports possible right-sided shingles in the past and therefore this may be related to such. Result of myelitis secondary to recent infection (such as influenza) with hemorrhagic conversion or ADEM with hemorrhagic conversion are considerations. Thoracic cord ischemia with hemorrhagic conversion. Given the fact the patient has a slightly dilated ascending thoracic aorta, CT angiogram of the chest abdomen and pelvis may be considered to exclude dissection not detected on the current exam. Vascular malformation such as a cavernoma with bleeding and surrounding edema. Although there is blush like enhancement, appearance is not typical for dural fistula as vessels are not seen along the periphery of the cord. Additionally, symptoms occurred more acutely than expected with dural fistula. Post inflammatory myelitis/vasculitis with bleeding (such as Behcet's disease, sarcoidosis, Wegener's, etc.). Tumor felt last likely consideration (ependymomas can bleed but usually enhance more). Metastatic  disease felt less likely unless the patient has a known malignancy with hemorrhagic propensity such as melanoma. Neuromyelitis optica. Multiple sclerosis with hemorrhagic conversion felt less likely consideration. C6-7 moderate bulge/ osteophyte with mild spinal stenosis and minimal cord flattening. Axial images not obtained. Scattered minimal bulges throughout the thoracic region without large thoracic disc herniation causing cord compression. Mild edema within the right C7 facet possibly degenerative in origin. MRI LUMBAR SPINE Multi-level degenerative changes most prominent L4-5 level as detailed above. These results were called by telephone at the time of interpretation on 05/26/2016 at 12:44 pm to Dr. Karma Ganja, who verbally acknowledged these results. Electronically Signed   By: Lacy Duverney M.D.   On: 05/26/2016 13:15   Mr Lumbar Spine W Wo Contrast  Result Date: 05/26/2016 EXAM: MRI THORACIC  AND LUMBAR SPINE WITHOUT AND WITH CONTRAST TECHNIQUE: Multiplanar and multiecho pulse sequences of the thoracic and lumbar spine were obtained without and with intravenous contrast. CONTRAST:  20mL MULTIHANCE GADOBENATE DIMEGLUMINE 529 MG/ML IV SOLN COMPARISON:  None. FINDINGS: MRI THORACIC SPINE FINDINGS Alignment:  Minimal curvature. Vertebrae: Mild edema within the right C7 facet possibly degenerative in origin. Cord: Abnormal signal within the cord extends from the lower T3 through the mid T10 level. Additionally, blood breakdown products are noted within the right aspect of the cord extending from the T5-6 level to the lower T9 level (maximal at the T7-8 level). Mild enhancement of the cord T6-7 through upper T8 level. Paraspinal and other soft tissues: Mild dilation thoracic aorta with ascending thoracic aorta measuring up to 4.2 cm. Disc levels: C6-7: Moderate bulge/ osteophyte with mild spinal stenosis and minimal cord flattening. Axial images not obtained. Scattered minimal bulges throughout the thoracic region  without large thoracic disc herniation causing cord compression. MRI LUMBAR SPINE FINDINGS Segmentation:  Last fully open disc space labeled L5-S1. Alignment:  Mild straightening and slight curvature. Vertebrae:  Endplate degenerative changes L4-5. Conus medullaris: Extends to the T12-L1 level. Paraspinal and other soft tissues: Tiny right renal cyst. Disc levels: L1-2:  Negative. L2-3: Mild facet degenerative changes. Minimal bulge greater left lateral position touching but not compressing exiting left L2 nerve root. L3-4: Bulge. Facet degenerative changes. Mild bilateral foraminal narrowing. Multifactorial mild spinal stenosis and lateral recess narrowing bilaterally. L4-5: Facet degenerative changes and ligamentum flavum hypertrophy greater on the right. Disc degeneration with disc space narrowing endplate reactive changes greater on the right. Bulge and osteophyte greater right foraminal/ lateral position encroaching upon exiting right L4 nerve root. Moderate narrowing right lateral thecal sac and right lateral recess. Mild spinal stenosis greater on right. L5-S1: Moderate facet degenerative changes. Bulge with osteophyte. Mild lateral recess narrowing. Mild foraminal narrowing greater on the left. IMPRESSION: MRI THORACIC SPINE Abnormal signal within the thoracic cord extends from the lower T3 through the mid T10 level suggestive of edema. Additionally, blood breakdown products are noted within the right aspect of the cord extending from the T5-6 level to the lower T9 level (maximal at the T7-8 level). Mild enhancement of the cord T6-7 through upper T8 level. Etiology of thoracic cord abnormality is indeterminate. Considerations include: Hemorrhagic transverse myelitis secondary to viral trigger (infection or vaccination). Patient reports possible right-sided shingles in the past and therefore this may be related to such. Result of myelitis secondary to recent infection (such as influenza) with hemorrhagic  conversion or ADEM with hemorrhagic conversion are considerations. Thoracic cord ischemia with hemorrhagic conversion. Given the fact the patient has a slightly dilated ascending thoracic aorta, CT angiogram of the chest abdomen and pelvis may be considered to exclude dissection not detected on the current exam. Vascular malformation such as a cavernoma with bleeding and surrounding edema. Although there is blush like enhancement, appearance is not typical for dural fistula as vessels are not seen along the periphery of the cord. Additionally, symptoms occurred more acutely than expected with dural fistula. Post inflammatory myelitis/vasculitis with bleeding (such as Behcet's disease, sarcoidosis, Wegener's, etc.). Tumor felt last likely consideration (ependymomas can bleed but usually enhance more). Metastatic disease felt less likely unless the patient has a known malignancy with hemorrhagic propensity such as melanoma. Neuromyelitis optica. Multiple sclerosis with hemorrhagic conversion felt less likely consideration. C6-7 moderate bulge/ osteophyte with mild spinal stenosis and minimal cord flattening. Axial images not obtained. Scattered minimal bulges throughout the  thoracic region without large thoracic disc herniation causing cord compression. Mild edema within the right C7 facet possibly degenerative in origin. MRI LUMBAR SPINE Multi-level degenerative changes most prominent L4-5 level as detailed above. These results were called by telephone at the time of interpretation on 05/26/2016 at 12:44 pm to Dr. Karma GanjaLinker, who verbally acknowledged these results. Electronically Signed   By: Lacy DuverneySteven  Olson M.D.   On: 05/26/2016 13:15   Dg Abdomen Acute W/chest  Result Date: 05/24/2016 CLINICAL DATA:  Abdominal pain EXAM: DG ABDOMEN ACUTE W/ 1V CHEST COMPARISON:  None. FINDINGS: The lungs are clear.  Cardiomediastinal contours are normal. No free intraperitoneal air. There is a large amount of stool within the colon.  No dilated small bowel is identified. IMPRESSION: 1. Clear lungs. 2. No free intraperitoneal air. 3. Large amount of stool within the colon. No radiographic evidence of small-bowel obstruction. Electronically Signed   By: Deatra RobinsonKevin  Herman M.D.   On: 05/24/2016 01:57   Ct Angio Chest/abd/pel For Dissection W And/or Wo Contrast  Result Date: 05/26/2016 CLINICAL DATA:  Low back pain and abdominal pain. Evidence for thoracic cord ischemia with hemorrhagic conversion on recent MRI. CT angiogram was recommended to exclude an aortic dissection. EXAM: CT ANGIOGRAPHY CHEST, ABDOMEN AND PELVIS TECHNIQUE: Multidetector CT imaging through the chest, abdomen and pelvis was performed using the standard protocol during bolus administration of intravenous contrast. Multiplanar reconstructed images and MIPs were obtained and reviewed to evaluate the vascular anatomy. CONTRAST:  100 mL Isovue 370 COMPARISON:  Thoracic spine MRI 05/26/2016 FINDINGS: CTA CHEST FINDINGS Cardiovascular: The aortic root at the sinuses of Valsalva measures 4.5 cm. The mid ascending thoracic aorta is aneurysmal measuring up to 4.2 cm. 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 The great vessels are patent. Negative for an aortic dissection. Proximal descending thoracic aorta measures 3.2 cm. Pulmonary arteries are patent. Mediastinum/Nodes: No evidence for chest lymphadenopathy. No significant pericardial fluid. Lungs/Pleura: Trachea and mainstem bronchi are patent. Focal pleural thickening or nodularity along the left major fissure on sequence 7, image 80 measuring up to 5 mm. Otherwise, the lungs are clear. No significant airspace disease or consolidation. No pleural effusions. Musculoskeletal: No acute bone abnormality. Review of the MIP images confirms the above findings. CTA ABDOMEN AND PELVIS FINDINGS VASCULAR Aorta: Normal caliber aorta without aneurysm, dissection, vasculitis or significant  stenosis. Celiac: Patent without evidence of aneurysm, dissection, vasculitis or significant stenosis. SMA: Patent without evidence of aneurysm, dissection, vasculitis or significant stenosis. Renals: Both renal arteries are patent without evidence of aneurysm, dissection, vasculitis, fibromuscular dysplasia or significant stenosis. There is an accessory inferior left renal artery which is patent. IMA: Patent without evidence of aneurysm, dissection, vasculitis or significant stenosis. Inflow: Mild wall calcifications in the iliac arteries without significant stenosis. Internal and external iliac arteries are patent bilaterally. No dissection. Veins: No obvious venous abnormality within the limitations of this arterial phase study. Review of the MIP images confirms the above findings. NON-VASCULAR Hepatobiliary: No acute abnormality to the liver or gallbladder. Pancreas: Normal appearance of the pancreas without inflammation or duct dilatation. Spleen: Normal appearance of spleen without enlargement. Adrenals/Urinary Tract: Normal adrenal glands. Normal appearance of both kidneys without suspicious lesion or hydronephrosis. Urinary bladder is decompressed with a Foley catheter. Stomach/Bowel: There is a large amount of stool in the right colon and hepatic flexure. No evidence for bowel obstruction or focal inflammation. Lymphatic: No significant lymph node enlargement in the abdomen and pelvis.  Soft tissue in the mesentery adjacent to loops of bowel on sequence 6, image 257 is nonspecific and may represent a few mesenteric lymph nodes. Reproductive: Prostate is unremarkable. Seminal vesicles are unremarkable. Other: No significant ascites in the abdomen or pelvis. Musculoskeletal: Mild retrolisthesis at L4-L5 with some disc space narrowing. Refer to recent spine MRI. Review of the MIP images confirms the above findings. IMPRESSION: Negative for an aortic dissection. No acute intrathoracic or intra-abdominal disease.  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 Ascending thoracic aorta measures up to 4.2 cm. Recommend annual imaging followup by CTA or MRA. This recommendation follows 2010 ACCF/AHA/AATS/ACR/ASA/SCA/SCAI/SIR/STS/SVM Guidelines for the Diagnosis and Management of Patients with Thoracic Aortic Disease. Circulation. 2010; 121: e266-e369 5 mm nodule along the left major fissure is indeterminate. No follow-up needed if patient is low-risk. Non-contrast chest CT can be considered in 12 months if patient is high-risk. This recommendation follows the consensus statement: Guidelines for Management of Incidental Pulmonary Nodules Detected on CT Images: From the Fleischner Society 2017; Radiology 2017; 284:228-243. Large amount of stool involving the right colon. Electronically Signed   By: Richarda Overlie M.D.   On: 05/26/2016 15:24    Lab Data:  CBC:  Recent Labs Lab 05/23/16 2229 05/26/16 0928 05/27/16 0348  WBC 6.2 6.1 5.8  NEUTROABS  --  3.0  --   HGB 14.5 15.1 15.0  HCT 42.3 43.6 43.9  MCV 86.2 85.7 85.7  PLT 216 231 254   Basic Metabolic Panel:  Recent Labs Lab 05/23/16 2229 05/26/16 0928 05/27/16 0348  NA 138 135 135  K 4.2 4.5 5.1  CL 102 100* 98*  CO2 25 27 23   GLUCOSE 103* 112* 133*  BUN 15 13 16   CREATININE 1.32* 1.34* 1.41*  CALCIUM 9.6 10.0 10.1   GFR: Estimated Creatinine Clearance: 75.2 mL/min (by C-G formula based on SCr of 1.41 mg/dL (H)). Liver Function Tests:  Recent Labs Lab 05/23/16 2229  AST 26  ALT 27  ALKPHOS 71  BILITOT 0.7  PROT 7.6  ALBUMIN 4.5    Recent Labs Lab 05/23/16 2229  LIPASE 31   No results for input(s): AMMONIA in the last 168 hours. Coagulation Profile: No results for input(s): INR, PROTIME in the last 168 hours. Cardiac Enzymes: No results for input(s): CKTOTAL, CKMB, CKMBINDEX, TROPONINI in the last 168 hours. BNP (last 3 results) No results for input(s): PROBNP in the last 8760 hours. HbA1C: No results for  input(s): HGBA1C in the last 72 hours. CBG: No results for input(s): GLUCAP in the last 168 hours. Lipid Profile: No results for input(s): CHOL, HDL, LDLCALC, TRIG, CHOLHDL, LDLDIRECT in the last 72 hours. Thyroid Function Tests: No results for input(s): TSH, T4TOTAL, FREET4, T3FREE, THYROIDAB in the last 72 hours. Anemia Panel: No results for input(s): VITAMINB12, FOLATE, FERRITIN, TIBC, IRON, RETICCTPCT in the last 72 hours. Urine analysis:    Component Value Date/Time   COLORURINE YELLOW 05/26/2016 0927   APPEARANCEUR CLEAR 05/26/2016 0927   LABSPEC 1.013 05/26/2016 0927   PHURINE 5.0 05/26/2016 0927   GLUCOSEU NEGATIVE 05/26/2016 0927   HGBUR NEGATIVE 05/26/2016 0927   BILIRUBINUR NEGATIVE 05/26/2016 0927   KETONESUR NEGATIVE 05/26/2016 0927   PROTEINUR NEGATIVE 05/26/2016 0927   NITRITE NEGATIVE 05/26/2016 0927   LEUKOCYTESUR NEGATIVE 05/26/2016 1914     Devani Odonnel M.D. [facility] 05/27/2016, 1:00 PM  Pager: [phone] Between 7am to 7pm - call Pager - [phone]  After 7pm go to [email or web address] - password [facility]  Call  night coverage person covering after 7pm

## 2016-05-27 NOTE — Progress Notes (Signed)
Occupational Therapy Evaluation Patient Details Name: Perry Morrison MRN: [id number] DOB: [birth year] Today's Date: 05/27/2016    History of Present Illness 56 year old male who had noted some abdominal pain and constipation several days before and was seen in emergency room given some muscle relaxers but then again a half ago started to develop right lower extremity weakness. 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 There is some mass effect in the cord itself from this lesion. The rest of his cervical spine looks normal. The patient had an lumbar puncture pending.   Clinical Impression   PTA, pt independent with ADL and mobility and very active. 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 Pt presents with BLE weakness and abnormal tone, resulting in requiring Mod A +2 for sit - stand and Max A with LB ADL. Unable to ambulate at this time, therefore Stedy used to help with bed - chair transfer. Pt is very motivated to become independent and is an excellent CIR candidate. Recommend nsg use Stedy to transfer pt. Also recommend pt have drop arm commode and drop arm recliner to assist with his mobility and rehab. Will follow acutely to maximize functional level of independence.     Follow Up Recommendations  CIR;Supervision/Assistance - 24 hour    Equipment Recommendations  3 in 1 bedside commode;Tub/shower bench;Wheelchair (measurements OT);Wheelchair cushion (measurements OT)    Recommendations for Other Services Rehab consult  Pastoral Consult     Precautions / Restrictions Precautions Precautions: Fall Required Braces or Orthoses: Other Brace/Splint (May need Prafo for R) Restrictions Weight Bearing Restrictions: No      Mobility Bed Mobility Overal bed mobility: Needs  Assistance Bed Mobility: Supine to Sit     Supine to sit: Min guard     General bed mobility comments: heavy reliance on BUE strength and bedrails. Pt able to actively adduct RLE to move off bed. Also using his arms to move RLE  Transfers Overall transfer level: Needs assistance Equipment used: Rolling walker (2 wheeled) (Stedy) Transfers: Sit to/from Stand Sit to Stand: Mod assist;+2 physical assistance (with RW)         General transfer comment: Pt unsafe to ambulate at this time. Used Stedy to tranfer pt to chair. Min A to help control descent.     Balance Overall balance assessment: Needs assistance   Sitting balance-Leahy Scale: Fair       Standing balance-Leahy Scale: Poor                              ADL Overall ADL's : Needs assistance/impaired     Grooming: Set up   Upper Body Bathing: Set up;Sitting   Lower Body Bathing: Moderate assistance;Sitting/lateral leans   Upper Body Dressing : Set up;Sitting   Lower Body Dressing: Maximal assistance;Sitting/lateral leans   Toilet Transfer: +2 for physical assistance;Moderate assistance (sit 0s tand; simulated)   Toileting- Clothing Manipulation and Hygiene: Maximal assistance Toileting - Clothing Manipulation Details (indicate cue type and reason): foley     Functional mobility during ADLs: +2 for physical assistance;Moderate assistance;Rolling walker General ADL Comments: Pt unable to take steps due to B knee buckling. Able to stand with +2 mod A although heavy use of his BUE. Pt  unable to cross feet over knees in sitting for LB ADL. Began education on compensatory techniques for ADL. Pt expressed relief knowing that someone was going to help him learn how to do self care.      Vision     Perception     Praxis      Pertinent Vitals/Pain Pain Assessment: No/denies pain     Hand Dominance Right   Extremity/Trunk Assessment Upper Extremity Assessment Upper Extremity Assessment: Overall  WFL for tasks assessed   Lower Extremity Assessment Lower Extremity Assessment: Defer to PT evaluation (unable to actively flex hip or extend knee. active adduction RLE @ 2+/5).    Cervical / Trunk Assessment Cervical / Trunk Assessment: Other exceptions Cervical / Trunk Exceptions: affected by core weaknes but able ot sustain midline    Communication Communication Communication: No difficulties   Cognition Arousal/Alertness: Awake/alert Behavior During Therapy: WFL for tasks assessed/performed Overall Cognitive Status: Within Functional Limits for tasks assessed                     General Comments       Exercises Exercises: Other exercises Other Exercises Other Exercises: Pt completing modified squats in stedy   Shoulder Instructions      Home Living Family/patient expects to be discharged to:: Private residence Living Arrangements: Non-relatives/Friends Available Help at Discharge: Friend(s) Type of Home: House Home Access: Stairs to enter Secretary/administrator of Steps: 5 Entrance Stairs-Rails: Left Home Layout: Two level;1/2 bath on main level;Bed/bath upstairs Alternate Level Stairs-Number of Steps: flight   Bathroom Shower/Tub: Tub/shower unit;Curtain Shower/tub characteristics: Engineer, building services: Standard Bathroom Accessibility: Yes How Accessible: Accessible via walker Home Equipment: None          Prior Functioning/Environment Level of Independence: Independent        Comments: recently lost job. working in [employer] currently        OT Problem List: Decreased strength;Decreased range of motion;Impaired balance (sitting and/or standing);Decreased coordination;Decreased safety awareness;Decreased knowledge of use of DME or AE;Impaired sensation;Impaired tone   OT Treatment/Interventions: Self-care/ADL training;Therapeutic exercise;Neuromuscular education;DME and/or AE instruction;Therapeutic activities;Patient/family education;Balance  training    OT Goals(Current goals can be found in the care plan section) Acute Rehab OT Goals Patient Stated Goal: to be able to walk and do for his self OT Goal Formulation: With patient Time For Goal Achievement: 06/10/16 Potential to Achieve Goals: Good ADL Goals Pt Will Perform Lower Body Bathing: with modified independence;with adaptive equipment;sitting/lateral leans Pt Will Perform Lower Body Dressing: with modified independence;with adaptive equipment;sitting/lateral leans Pt Will Transfer to Toilet: with modified independence;squat pivot transfer;bedside commode (drop arm) Pt Will Perform Toileting - Clothing Manipulation and hygiene: with modified independence;sitting/lateral leans;with adaptive equipment Pt Will Perform Tub/Shower Transfer: tub bench;with supervision;Squat pivot transfer  OT Frequency: Min 3X/week   Barriers to D/C:    Currently living in house with 5 STE       Co-evaluation PT/OT/SLP Co-Evaluation/Treatment: Yes Reason for Co-Treatment: For patient/therapist safety;To address functional/ADL transfers   OT goals addressed during session: ADL's and self-care;Strengthening/ROM      End of Session Equipment Utilized During Treatment: Gait belt;Rolling walker;Other (comment) Antony Salmon) Nurse Communication: Mobility status;Need for lift equipment  Activity Tolerance: Patient tolerated treatment well Patient left: in chair;with call bell/phone within reach   Time: 1610-9604 OT Time Calculation (min): 43 min Charges:  OT General Charges $OT Visit: 1 Procedure OT Evaluation $OT Eval Moderate Complexity: 1 Procedure OT Treatments $Self Care/Home Management : 8-22 mins G-Codes:  Sidnie Swalley,HILLARY 05/27/2016, 10:45 AM   Luisa DagoHilary Larisha Vencill, OR/L  [phone] 05/27/2016

## 2016-05-28 DIAGNOSIS — M4714 Other spondylosis with myelopathy, thoracic region: Secondary | ICD-10-CM

## 2016-05-28 DIAGNOSIS — R29898 Other symptoms and signs involving the musculoskeletal system: Secondary | ICD-10-CM

## 2016-05-28 LAB — BASIC METABOLIC PANEL
Anion gap: 9 (ref 5–15)
BUN: 22 mg/dL — AB (ref 6–20)
CHLORIDE: 102 mmol/L (ref 101–111)
CO2: 24 mmol/L (ref 22–32)
CREATININE: 1.29 mg/dL — AB (ref 0.61–1.24)
Calcium: 9.8 mg/dL (ref 8.9–10.3)
GFR calc Af Amer: >60 mL/min (ref >60)
GFR calc non Af Amer: >60 mL/min (ref >60)
Glucose, Bld: 156 mg/dL — ABNORMAL HIGH (ref 65–99)
Potassium: 4.7 mmol/L (ref 3.5–5.1)
SODIUM: 135 mmol/L (ref 135–145)

## 2016-05-28 LAB — CBC
HCT: 42.7 % (ref 39.0–52.0)
Hemoglobin: 14.7 g/dL (ref 13.0–17.0)
MCH: 29.6 pg (ref 26.0–34.0)
MCHC: 34.4 g/dL (ref 30.0–36.0)
MCV: 85.9 fL (ref 78.0–100.0)
PLATELETS: 262 10*3/uL (ref 150–400)
RBC: 4.97 MIL/uL (ref 4.22–5.81)
RDW: 13.1 % (ref 11.5–15.5)
WBC: 8.6 10*3/uL (ref 4.0–10.5)

## 2016-05-28 NOTE — Consult Note (Signed)
Physical Medicine and Rehabilitation Consult Reason for Consult: Spinal cord lesion Referring Physician: [facility]   HPI: Perry Morrison is a 56 y.o. 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 Per chart review patient currently living with a male roommate that works during the day. He is going through a divorce and recently lost his job. Independent prior to admission. Most of his family is in the [place] area. Presented 05/26/2016 with abdominal pain constipation times several days. He was initially seen in the emergency room given some muscle relaxers but then developed right lower extremity weakness. An MRI of the thoracic spine demonstrated swelling in the midportion of the cord at T5 level with some right sided lesion consistent with hemorrhage within the cord down to the level of T9. There was some mass effect in the cord itself from the lesion. The rest of the cervical spine film was unremarkable. Cranial CT scan negative. MRI of the brain showed no significant white matter signal abnormalities to suggest demyelinating disease. Lumbar puncture completed total protein 81, glucose 56, CSF showed no white cells. Neurosurgery as well as neurology consulted etiology suggestive of spinal cord infarct versus vascular malformation. Presently maintained on intravenous Solu-Medrol with workup ongoing. Physical therapy evaluation completed 05/27/2016 with recommendations of physical medicine rehabilitation consult.   Review of Systems  Constitutional: Negative for chills and fever.  HENT: Negative for hearing loss and tinnitus.   Eyes: Negative for blurred vision and double vision.  Respiratory: Negative for cough and shortness of breath.   Cardiovascular: Negative for chest pain, palpitations and leg swelling.  Gastrointestinal: Positive for abdominal pain and constipation.  Genitourinary:       Urinary retention  Musculoskeletal: Positive for myalgias.  Negative for falls.  Skin: Negative for rash.  Neurological: Positive for weakness. Negative for seizures.  All other systems reviewed and are negative.  Past Medical History:  Diagnosis Date  . AKI (acute kidney injury) (HCC)   . 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 Hypertension   . Myelitis (HCC)   . Right leg weakness   . Urinary retention    History reviewed. No pertinent surgical history. Family History  Problem Relation Age of Onset  . Hypertension Mother   . AAA (abdominal aortic aneurysm) Mother    Social History:  reports that he has never smoked. He has never used smokeless tobacco. He reports that he does not drink alcohol or use drugs. Allergies: No Known Allergies Medications Prior to Admission  Medication Sig Dispense Refill  . MAGNESIUM PO Take 1 tablet by mouth daily.    . Omega-3 Fatty Acids (OMEGA-3 PO) Take 1 capsule by mouth daily.    Marland Kitchen lidocaine (LMX) 4 % cream Apply 1 application topically 3 (three) times daily as needed. (Patient not taking: Reported on 05/26/2016) 133 g 0  . methocarbamol (ROBAXIN) 500 MG tablet Can take up to 1-2 tabs every 6 hours PRN PAIN (Patient not taking: Reported on 05/26/2016) 20 tablet 0    Home: Home Living Family/patient expects to be discharged to:: Private residence Living Arrangements: Non-relatives/Friends Available Help at Discharge: Friend(s) Type of Home: House Home Access: Stairs to enter Secretary/administrator of Steps: 5 Entrance Stairs-Rails: Left Home Layout: Two level, 1/2 bath on main level, Bed/bath upstairs Alternate Level Stairs-Number of Steps: flight Bathroom Shower/Tub: Tub/shower unit, Engineer, building services: Standard Bathroom Accessibility: Yes Home Equipment: None Additional Comments: pt currently living  with a roommate. Going through a divorce. 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 Played football at [employer]. Has a 24 yo son who lives with ex wife Functional Status:  Mobility: Bed Mobility Overal bed mobility: Needs Assistance Bed Mobility: Supine to Sit Supine to sit: Min guard General bed mobility comments: heavy reliance on BUE strength and bedrails. Pt able to actively adduct RLE to move off bed. Also using his arms to move RLE Transfers Overall transfer level: Needs assistance Equipment used: Ambulation equipment used, Rolling walker (2 wheeled) Transfer via Lift Equipment: Stedy Transfers: Sit to/from Stand Sit to Stand: Mod assist, +2 physical assistance General transfer comment: practiced sit to stand multiple times to RW as well as stedy. Needed mod/ max A to stand with hands placed on bed. With stedy, pt able to pull self up with UE's with mod A +2. Min A to help control descent with sitting Ambulation/Gait General Gait Details: unable to ambulate safely    ADL: ADL Overall ADL's : Needs assistance/impaired Grooming: Set up Upper Body Bathing: Set up, Sitting Lower Body Bathing: Moderate assistance, Sitting/lateral leans Upper Body Dressing : Set up, Sitting Lower Body Dressing: Maximal assistance, Sitting/lateral leans Toilet Transfer: +2 for physical assistance, Moderate assistance (sit 0s tand; simulated) Toileting- Clothing Manipulation and Hygiene: Maximal assistance Toileting - Clothing Manipulation Details (indicate cue type and reason): foley Functional mobility during ADLs: +2 for physical assistance, Moderate assistance, Rolling walker General ADL Comments: Pt unable to take steps due to B knee buckling. Able to stand with +2 mod A although heavy use of his BUE. Pt unable to cross feet over knees in sitting for LB ADL. Began education on compensatory techniques for ADL. Pt expressed relief knowing that someone was going to help him learn how to do self care.    Cognition: Cognition Overall Cognitive Status: Within Functional Limits for tasks assessed Orientation Level: Oriented X4 Cognition Arousal/Alertness: Awake/alert Behavior During Therapy: WFL for tasks assessed/performed Overall Cognitive Status: Within Functional Limits for tasks assessed General Comments: scared about situation, appropriately  Blood pressure (!) 153/73, pulse 78, temperature 97.4 F (36.3 C), temperature source Oral, resp. rate 20, height 6\' 1"  (1.854 m), weight 102.1 kg (225 lb), SpO2 97 %. Physical Exam  Constitutional: He is oriented to person, place, and time. He appears well-developed.  HENT:  Head: Normocephalic.  Eyes: EOM are normal.  Neck: Normal range of motion. Neck supple. No thyromegaly present.  Cardiovascular: Normal rate, regular rhythm and normal heart sounds.   Respiratory: Effort normal and breath sounds normal. No respiratory distress.  GI: Soft. Bowel sounds are normal. He exhibits no distension.  Genitourinary:  Genitourinary Comments: Foley in  Neurological: He is alert and oriented to person, place, and time.  Pt alert and appropriate. UE 5/5. RLE: 0 to ?trace /5 HF,KE ADF/PF. LLE: 3- to 3/5 HF, KE and 2- to 2/5 ADF/PF. Senses pain in both lower extremities but appears to have decreased proprioception. DTRs 1+. No resting tone evdient  Skin: Skin is warm and dry.    Results for orders placed or performed during the hospital encounter of 05/26/16 (from the past 24 hour(s))  Basic metabolic panel     Status: Abnormal   Collection Time: 05/28/16  5:24 AM  Result Value Ref Range   Sodium 135 135 - 145 mmol/L   Potassium 4.7 3.5 - 5.1  mmol/L   Chloride 102 101 - 111 mmol/L   CO2 24 22 - 32 mmol/L   Glucose, Bld 156 (H) 65 - 99 mg/dL   BUN 22 (H) 6 - 20 mg/dL   Creatinine, Ser 1.61 (H) 0.61 - 1.24 mg/dL   Calcium 9.8 8.9 - 09.6 mg/dL   GFR calc non Af Amer >60 >60 mL/min   GFR calc Af Amer >60 >60 mL/min   Anion gap 9 5 - 15   Ct  Head Wo Contrast  Result Date: 05/26/2016 CLINICAL DATA:  Right leg numbness with decreased movement of right foot. EXAM: CT HEAD WITHOUT CONTRAST TECHNIQUE: Contiguous axial images were obtained from the base of the skull through the vertex without intravenous contrast. COMPARISON:  None. FINDINGS: Brain: No evidence of acute infarction, hemorrhage, hydrocephalus, extra-axial collection or mass lesion/mass effect. Vascular: No hyperdense vessel or unexpected calcification. Skull: Normal. Negative for fracture or focal lesion. Sinuses/Orbits: No acute finding. Other: None. IMPRESSION: Normal head CT. Electronically Signed   By: Irish Lack M.D.   On: 05/26/2016 15:21   Mr Laqueta Jean EA Contrast  Result Date: 05/27/2016 CLINICAL DATA:  Evaluate for intracranial white matter abnormalities in a patient with paraplegia. EXAM: MRI HEAD WITHOUT AND WITH CONTRAST TECHNIQUE: Multiplanar, multiecho pulse sequences of the brain and surrounding structures were obtained without and with intravenous contrast. CONTRAST:  20mL MULTIHANCE GADOBENATE DIMEGLUMINE 529 MG/ML IV SOLN COMPARISON:  MRI thoracic spine 05/26/2016. FINDINGS: Brain: No evidence for acute infarction, hemorrhage, mass lesion, hydrocephalus, or extra-axial fluid. Normal for age cerebral volume. No significant white matter signal abnormality to suggest demyelinating disease, vasculitis, or inflammatory/infectious process. Post infusion, no abnormal enhancement of the brain or meninges. Vascular: Flow voids are maintained throughout the carotid, basilar, and vertebral arteries. There are no areas of chronic hemorrhage. Skull and upper cervical spine: Unremarkable visualized calvarium, skullbase, and cervical vertebrae. Pituitary, pineal, cerebellar tonsils unremarkable. No upper cervical cord lesions. Sinuses/Orbits: No orbital masses or proptosis. Globes appear symmetric. Sinuses appear well aerated, without evidence for air-fluid level. Within limits for  assessment on routine brain MR, normal-appearing optic nerves, optic chiasm, and tracts. Other: No nasopharyngeal pathology or mastoid fluid. Scalp and other visualized extracranial soft tissues grossly unremarkable. IMPRESSION: No significant white matter signal abnormalities to suggest demyelinating disease, vasculitis, or infectious/inflammatory process. No abnormal postcontrast enhancement to suggest a granulomatous process such as neurosarcoid. Within limits for assessment on routine brain MR, negative orbits; no evidence for demyelinating process of the anterior optic pathways. Electronically Signed   By: Elsie Stain M.D.   On: 05/27/2016 17:17   Mr Thoracic Spine W Wo Contrast  Result Date: 05/26/2016 EXAM: MRI THORACIC AND LUMBAR SPINE WITHOUT AND WITH CONTRAST TECHNIQUE: Multiplanar and multiecho pulse sequences of the thoracic and lumbar spine were obtained without and with intravenous contrast. CONTRAST:  20mL MULTIHANCE GADOBENATE DIMEGLUMINE 529 MG/ML IV SOLN COMPARISON:  None. FINDINGS: MRI THORACIC SPINE FINDINGS Alignment:  Minimal curvature. Vertebrae: Mild edema within the right C7 facet possibly degenerative in origin. Cord: Abnormal signal within the cord extends from the lower T3 through the mid T10 level. Additionally, blood breakdown products are noted within the right aspect of the cord extending from the T5-6 level to the lower T9 level (maximal at the T7-8 level). Mild enhancement of the cord T6-7 through upper T8 level. Paraspinal and other soft tissues: Mild dilation thoracic aorta with ascending thoracic aorta measuring up to 4.2 cm. Disc levels: C6-7: Moderate bulge/ osteophyte with mild  spinal stenosis and minimal cord flattening. Axial images not obtained. Scattered minimal bulges throughout the thoracic region without large thoracic disc herniation causing cord compression. MRI LUMBAR SPINE FINDINGS Segmentation:  Last fully open disc space labeled L5-S1. Alignment:  Mild  straightening and slight curvature. Vertebrae:  Endplate degenerative changes L4-5. Conus medullaris: Extends to the T12-L1 level. Paraspinal and other soft tissues: Tiny right renal cyst. Disc levels: L1-2:  Negative. L2-3: Mild facet degenerative changes. Minimal bulge greater left lateral position touching but not compressing exiting left L2 nerve root. L3-4: Bulge. Facet degenerative changes. Mild bilateral foraminal narrowing. Multifactorial mild spinal stenosis and lateral recess narrowing bilaterally. L4-5: Facet degenerative changes and ligamentum flavum hypertrophy greater on the right. Disc degeneration with disc space narrowing endplate reactive changes greater on the right. Bulge and osteophyte greater right foraminal/ lateral position encroaching upon exiting right L4 nerve root. Moderate narrowing right lateral thecal sac and right lateral recess. Mild spinal stenosis greater on right. L5-S1: Moderate facet degenerative changes. Bulge with osteophyte. Mild lateral recess narrowing. Mild foraminal narrowing greater on the left. IMPRESSION: MRI THORACIC SPINE Abnormal signal within the thoracic cord extends from the lower T3 through the mid T10 level suggestive of edema. Additionally, blood breakdown products are noted within the right aspect of the cord extending from the T5-6 level to the lower T9 level (maximal at the T7-8 level). Mild enhancement of the cord T6-7 through upper T8 level. Etiology of thoracic cord abnormality is indeterminate. Considerations include: Hemorrhagic transverse myelitis secondary to viral trigger (infection or vaccination). Patient reports possible right-sided shingles in the past and therefore this may be related to such. Result of myelitis secondary to recent infection (such as influenza) with hemorrhagic conversion or ADEM with hemorrhagic conversion are considerations. Thoracic cord ischemia with hemorrhagic conversion. Given the fact the patient has a slightly dilated  ascending thoracic aorta, CT angiogram of the chest abdomen and pelvis may be considered to exclude dissection not detected on the current exam. Vascular malformation such as a cavernoma with bleeding and surrounding edema. Although there is blush like enhancement, appearance is not typical for dural fistula as vessels are not seen along the periphery of the cord. Additionally, symptoms occurred more acutely than expected with dural fistula. Post inflammatory myelitis/vasculitis with bleeding (such as Behcet's disease, sarcoidosis, Wegener's, etc.). Tumor felt last likely consideration (ependymomas can bleed but usually enhance more). Metastatic disease felt less likely unless the patient has a known malignancy with hemorrhagic propensity such as melanoma. Neuromyelitis optica. Multiple sclerosis with hemorrhagic conversion felt less likely consideration. C6-7 moderate bulge/ osteophyte with mild spinal stenosis and minimal cord flattening. Axial images not obtained. Scattered minimal bulges throughout the thoracic region without large thoracic disc herniation causing cord compression. Mild edema within the right C7 facet possibly degenerative in origin. MRI LUMBAR SPINE Multi-level degenerative changes most prominent L4-5 level as detailed above. These results were called by telephone at the time of interpretation on 05/26/2016 at 12:44 pm to Dr. Karma Ganja, who verbally acknowledged these results. Electronically Signed   By: Lacy Duverney M.D.   On: 05/26/2016 13:15   Mr Lumbar Spine W Wo Contrast  Result Date: 05/26/2016 EXAM: MRI THORACIC AND LUMBAR SPINE WITHOUT AND WITH CONTRAST TECHNIQUE: Multiplanar and multiecho pulse sequences of the thoracic and lumbar spine were obtained without and with intravenous contrast. CONTRAST:  20mL MULTIHANCE GADOBENATE DIMEGLUMINE 529 MG/ML IV SOLN COMPARISON:  None. FINDINGS: MRI THORACIC SPINE FINDINGS Alignment:  Minimal curvature. Vertebrae: Mild edema within the  right C7  facet possibly degenerative in origin. Cord: Abnormal signal within the cord extends from the lower T3 through the mid T10 level. Additionally, blood breakdown products are noted within the right aspect of the cord extending from the T5-6 level to the lower T9 level (maximal at the T7-8 level). Mild enhancement of the cord T6-7 through upper T8 level. Paraspinal and other soft tissues: Mild dilation thoracic aorta with ascending thoracic aorta measuring up to 4.2 cm. Disc levels: C6-7: Moderate bulge/ osteophyte with mild spinal stenosis and minimal cord flattening. Axial images not obtained. Scattered minimal bulges throughout the thoracic region without large thoracic disc herniation causing cord compression. MRI LUMBAR SPINE FINDINGS Segmentation:  Last fully open disc space labeled L5-S1. Alignment:  Mild straightening and slight curvature. Vertebrae:  Endplate degenerative changes L4-5. Conus medullaris: Extends to the T12-L1 level. Paraspinal and other soft tissues: Tiny right renal cyst. Disc levels: L1-2:  Negative. L2-3: Mild facet degenerative changes. Minimal bulge greater left lateral position touching but not compressing exiting left L2 nerve root. L3-4: Bulge. Facet degenerative changes. Mild bilateral foraminal narrowing. Multifactorial mild spinal stenosis and lateral recess narrowing bilaterally. L4-5: Facet degenerative changes and ligamentum flavum hypertrophy greater on the right. Disc degeneration with disc space narrowing endplate reactive changes greater on the right. Bulge and osteophyte greater right foraminal/ lateral position encroaching upon exiting right L4 nerve root. Moderate narrowing right lateral thecal sac and right lateral recess. Mild spinal stenosis greater on right. L5-S1: Moderate facet degenerative changes. Bulge with osteophyte. Mild lateral recess narrowing. Mild foraminal narrowing greater on the left. IMPRESSION: MRI THORACIC SPINE Abnormal signal within the thoracic cord  extends from the lower T3 through the mid T10 level suggestive of edema. Additionally, blood breakdown products are noted within the right aspect of the cord extending from the T5-6 level to the lower T9 level (maximal at the T7-8 level). Mild enhancement of the cord T6-7 through upper T8 level. Etiology of thoracic cord abnormality is indeterminate. Considerations include: Hemorrhagic transverse myelitis secondary to viral trigger (infection or vaccination). Patient reports possible right-sided shingles in the past and therefore this may be related to such. Result of myelitis secondary to recent infection (such as influenza) with hemorrhagic conversion or ADEM with hemorrhagic conversion are considerations. Thoracic cord ischemia with hemorrhagic conversion. Given the fact the patient has a slightly dilated ascending thoracic aorta, CT angiogram of the chest abdomen and pelvis may be considered to exclude dissection not detected on the current exam. Vascular malformation such as a cavernoma with bleeding and surrounding edema. Although there is blush like enhancement, appearance is not typical for dural fistula as vessels are not seen along the periphery of the cord. Additionally, symptoms occurred more acutely than expected with dural fistula. Post inflammatory myelitis/vasculitis with bleeding (such as Behcet's disease, sarcoidosis, Wegener's, etc.). Tumor felt last likely consideration (ependymomas can bleed but usually enhance more). Metastatic disease felt less likely unless the patient has a known malignancy with hemorrhagic propensity such as melanoma. Neuromyelitis optica. Multiple sclerosis with hemorrhagic conversion felt less likely consideration. C6-7 moderate bulge/ osteophyte with mild spinal stenosis and minimal cord flattening. Axial images not obtained. Scattered minimal bulges throughout the thoracic region without large thoracic disc herniation causing cord compression. Mild edema within the  right C7 facet possibly degenerative in origin. MRI LUMBAR SPINE Multi-level degenerative changes most prominent L4-5 level as detailed above. These results were called by telephone at the time of interpretation on 05/26/2016 at 12:44 pm to Dr.  Linker, who verbally acknowledged these results. Electronically Signed   By: Lacy Duverney M.D.   On: 05/26/2016 13:15   US Renal  Result Date: 05/27/2016 CLINICAL DATA:  Acute renal failure EXAM: RENAL / URINARY TRACT ULTRASOUND COMPLETE COMPARISON:  None. FINDINGS: Right Kidney: Length: 10.2 cm. Echogenicity within normal limits. No mass or hydronephrosis visualized. Left Kidney: Length: 11.2 cm. Echogenicity within normal limits. No mass or hydronephrosis visualized. Bladder: Decompressed by Foley catheter. IMPRESSION: No evidence for hydronephrosis. Electronically Signed   By: Kennith Center M.D.   On: 05/27/2016 17:51   Mr [facility] W Wo Contrast  Result Date: 05/27/2016 CLINICAL DATA:  Thoracic Spinal cord hemorrhagic lesion with edema. Paraplegia. EXAM: MRA SPINE WITHOUT AND WITH CONTRAST TECHNIQUE: Multiplanar and multiecho pulse sequences of the spine were obtained without and with intravenous contrast. Angiographic images of the spine were obtained using MRA technique without and with intravenous contrast. CONTRAST:  See below 20mL MULTIHANCE GADOBENATE DIMEGLUMINE 529 MG/ML IV SOLN COMPARISON:  MRI brain reported separately FINDINGS: There is good opacification of the spinal arteries and veins. No spinal AVM or fistula is identified. The cord remains enlarged, T2 hyperintense, with heterogeneity similar to 05/26/2016. See differential considerations as outlined on that study. IMPRESSION: No spinal AVM or fistula is identified. Electronically Signed   By: Elsie Stain M.D.   On: 05/27/2016 16:56   Ct Angio Chest/abd/pel For Dissection W And/or Wo Contrast  Result Date: 05/26/2016 CLINICAL DATA:  Low back pain and abdominal pain. Evidence for  thoracic cord ischemia with hemorrhagic conversion on recent MRI. CT angiogram was recommended to exclude an aortic dissection. EXAM: CT ANGIOGRAPHY CHEST, ABDOMEN AND PELVIS TECHNIQUE: Multidetector CT imaging through the chest, abdomen and pelvis was performed using the standard protocol during bolus administration of intravenous contrast. Multiplanar reconstructed images and MIPs were obtained and reviewed to evaluate the vascular anatomy. CONTRAST:  100 mL Isovue 370 COMPARISON:  Thoracic spine MRI 05/26/2016 FINDINGS: CTA CHEST FINDINGS Cardiovascular: The aortic root at the sinuses of Valsalva measures 4.5 cm. The mid ascending thoracic aorta is aneurysmal measuring up to 4.2 cm. 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 The great vessels are patent. Negative for an aortic dissection. Proximal descending thoracic aorta measures 3.2 cm. Pulmonary arteries are patent. Mediastinum/Nodes: No evidence for chest lymphadenopathy. No significant pericardial fluid. Lungs/Pleura: Trachea and mainstem bronchi are patent. Focal pleural thickening or nodularity along the left major fissure on sequence 7, image 80 measuring up to 5 mm. Otherwise, the lungs are clear. No significant airspace disease or consolidation. No pleural effusions. Musculoskeletal: No acute bone abnormality. Review of the MIP images confirms the above findings. CTA ABDOMEN AND PELVIS FINDINGS VASCULAR Aorta: Normal caliber aorta without aneurysm, dissection, vasculitis or significant stenosis. Celiac: Patent without evidence of aneurysm, dissection, vasculitis or significant stenosis. SMA: Patent without evidence of aneurysm, dissection, vasculitis or significant stenosis. Renals: Both renal arteries are patent without evidence of aneurysm, dissection, vasculitis, fibromuscular dysplasia or significant stenosis. There is an accessory inferior left renal artery which is patent. IMA: Patent  without evidence of aneurysm, dissection, vasculitis or significant stenosis. Inflow: Mild wall calcifications in the iliac arteries without significant stenosis. Internal and external iliac arteries are patent bilaterally. No dissection. Veins: No obvious venous abnormality within the limitations of this arterial phase study. Review of the MIP images confirms the above findings. NON-VASCULAR Hepatobiliary: No acute abnormality to the liver or gallbladder. Pancreas: Normal appearance  of the pancreas without inflammation or duct dilatation. Spleen: Normal appearance of spleen without enlargement. Adrenals/Urinary Tract: Normal adrenal glands. Normal appearance of both kidneys without suspicious lesion or hydronephrosis. Urinary bladder is decompressed with a Foley catheter. Stomach/Bowel: There is a large amount of stool in the right colon and hepatic flexure. No evidence for bowel obstruction or focal inflammation. Lymphatic: No significant lymph node enlargement in the abdomen and pelvis. Soft tissue in the mesentery adjacent to loops of bowel on sequence 6, image 257 is nonspecific and may represent a few mesenteric lymph nodes. Reproductive: Prostate is unremarkable. Seminal vesicles are unremarkable. Other: No significant ascites in the abdomen or pelvis. Musculoskeletal: Mild retrolisthesis at L4-L5 with some disc space narrowing. Refer to recent spine MRI. Review of the MIP images confirms the above findings. IMPRESSION: Negative for an aortic dissection. No acute intrathoracic or intra-abdominal disease. 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 Ascending thoracic aorta measures up to 4.2 cm. Recommend annual imaging followup by CTA or MRA. This recommendation follows 2010 ACCF/AHA/AATS/ACR/ASA/SCA/SCAI/SIR/STS/SVM Guidelines for the Diagnosis and Management of Patients with Thoracic Aortic Disease. Circulation. 2010; 121: [id number] 5 mm nodule along the left major fissure is indeterminate. No  follow-up needed if patient is low-risk. Non-contrast chest CT can be considered in 12 months if patient is high-risk. This recommendation follows the consensus statement: Guidelines for Management of Incidental Pulmonary Nodules Detected on CT Images: From the Fleischner Society 2017; Radiology 2017; 284:228-243. Large amount of stool involving the right colon. Electronically Signed   By: Richarda OverlieAdam  Henn M.D.   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 Does the need for close, 24 hr/day medical supervision in concert with the patient's rehab needs make it unreasonable for this patient to be served in a less intensive setting? Yes 2. Co-Morbidities requiring supervision/potential complications: AKI, CKD, HTN, management of neurogenic bowel and bladder 3. Due to bladder management, bowel management, safety, skin/wound care, disease management, medication administration, pain management and patient education, does the patient require 24 hr/day rehab nursing? Yes 4. Does the patient require coordinated care of a physician, rehab nurse, PT (1-2 hrs/day, 5 days/week) and OT (1-2 hrs/day, 5 days/week) to address physical and functional deficits in the context of the above medical diagnosis(es)? Yes Addressing deficits in the following areas: balance, endurance, locomotion, strength, transferring, bowel/bladder control, bathing, dressing, feeding, grooming, toileting and psychosocial support 5. Can the patient actively participate in an intensive therapy program of at least 3 hrs of therapy per day at least 5 days per week? Yes 6. The potential for patient to make measurable gains while on inpatient rehab is excellent 7. Anticipated functional outcomes upon discharge from inpatient rehab are modified independent and supervision  with PT, modified independent and supervision with OT, n/a with SLP. 8. Estimated rehab  length of stay to reach the above functional goals is: potentially 15-22 days 9. Does the patient have adequate social supports and living environment to accommodate these discharge functional goals? Yes and Potentially 10. Anticipated D/C setting: Home 11. Anticipated post D/C treatments: HH therapy and Outpatient therapy 12. Overall Rehab/Functional Prognosis: excellent  RECOMMENDATIONS: This patient's condition is appropriate for continued rehabilitative care in the following setting: CIR Patient has agreed to participate in recommended program. Yes Note that insurance prior authorization may be required for reimbursement for recommended care.  Comment: Rehab Admissions Coordinator to follow up.  Thanks,  Ranelle OysterZachary T. Swartz,  MD, Tracey Harries., PA-C 05/28/2016

## 2016-05-28 NOTE — Progress Notes (Signed)
Spoke to Ortho Tech-states he's on his way to floor

## 2016-05-28 NOTE — Progress Notes (Signed)
Physical Therapy Treatment Patient Details Name: Perry Morrison MRN: [id number] DOB: 1960-09-26 Today's Date: 05/28/2016    History of Present Illness 56 year old male who had noted some abdominal pain and constipation several days before and was seen in emergency room given some muscle relaxers but then again a half ago started to develop right lower extremity weakness. 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 There is some mass effect in the cord itself from this lesion. The rest of his cervical spine looks normal. The patient had an lumbar puncture pending.    PT Comments    Attempting to progress mobility with ambulation. Pt able to take steps forward/back with rw and +2 assistance. Pt needing PT assist for blocking of rt knee with stance. No active motion noted through Rt LE during session. Educated pt on dorsiflexion stretch following session. Pt is very motivated and putting forth good effort throughout session. Recommending CIR for further rehabilitation following acute stay.   Follow Up Recommendations  CIR     Equipment Recommendations   (to be determined)    Recommendations for Other Services       Precautions / Restrictions Precautions Precautions: Fall Restrictions Weight Bearing Restrictions: No    Mobility  Bed Mobility               General bed mobility comments: in chair upon arrival  Transfers Overall transfer level: Needs assistance Equipment used: Rolling walker (2 wheeled) Transfers: Sit to/from Stand Sit to Stand: +2 physical assistance;Mod assist         General transfer comment: Repeated sit/stand from chair, X3  Ambulation/Gait Ambulation/Gait assistance: +2 physical assistance;Mod assist Ambulation Distance (Feet):  (2 steps forward/back, 3 steps  forward/back) Assistive device: Rolling walker (2 wheeled) Gait Pattern/deviations: Step-to pattern Gait velocity: slow sequence   General Gait Details: PT blocking Rt knee with weight bearing, physical assist needed to advance Rt LE. +2 assist on Lt side for stability instanding and safety.    Stairs            Wheelchair Mobility    Modified Rankin (Stroke Patients Only)       Balance Overall balance assessment: Needs assistance Sitting-balance support: No upper extremity supported;Feet supported Sitting balance-Leahy Scale: Fair     Standing balance support: Bilateral upper extremity supported Standing balance-Leahy Scale: Poor Standing balance comment: able to stand with rw support                    Cognition Arousal/Alertness: Awake/alert Behavior During Therapy: WFL for tasks assessed/performed Overall Cognitive Status: Within Functional Limits for tasks assessed                      Exercises Other Exercises Other Exercises: dorsiflexion stretch, PT assist and self assist with blanket 3X30 seconds.  Other Exercises: rythmic stabilization of trunk Other Exercises: core strengthening with reaching acros  midline Other Exercises: propping B elbows on knees to reach feet then returning to upright midline position Other Exercises: Pt complained of neck tightness. Encouraged stretching and heat    General Comments        Pertinent Vitals/Pain Pain Assessment: Faces Faces Pain Scale: Hurts little more Pain Location: neck Pain Descriptors / Indicators: Aching (rubbing) Pain Intervention(s): Limited activity within patient's tolerance;Monitored during  session;Repositioned    Home Living                      Prior Function            PT Goals (current goals can now be found in the care plan section) Acute Rehab PT Goals Patient Stated Goal: be independent and active PT Goal Formulation: With patient Time For Goal Achievement:  06/10/16 Potential to Achieve Goals: Good Progress towards PT goals: Progressing toward goals    Frequency    Min 4X/week      PT Plan Current plan remains appropriate    Co-evaluation             End of Session Equipment Utilized During Treatment: Gait belt Activity Tolerance: Patient tolerated treatment well Patient left: in chair;with call bell/phone within reach     Time: 1140-1204 PT Time Calculation (min) (ACUTE ONLY): 24 min  Charges:  $Gait Training: 8-22 mins $Therapeutic Activity: 8-22 mins                    G Codes:      Christiane HaBenjamin J. Ziere Docken, PT, CSCS Pager [phone] Office [phone]  05/28/2016, 3:35 PM

## 2016-05-28 NOTE — Progress Notes (Signed)
[facility]                                                                              Patient Demographics  Perry Morrison, is a 56 y.o. male, DOB - 06/13/1960, ZOX:[id number]  Admit date - 05/26/2016   Admitting Physician Ozella Rocksavid J Merrell, MD  Outpatient Primary MD for the patient is No PCP Per Patient  Outpatient specialists:   LOS - 2  days    Chief Complaint  Patient presents with  . Leg Pain  . Abdominal Pain  . Urinary Frequency       Brief summary   Perry Morrison is a 56 y.o. male with medical history significant for hypertension this emergency Department chief complaint of persistent abdominal pain and sudden onset right foot leg weakness/numbness tingling.  MRI of the T-spine was concerning for abnormal signal within the thoracic cord extending from lower T3 through the mid T10 level suggestive of edema of indeterminate etiology.  Assessment & Plan    Principal Problem:  Right leg weakness/numbness tingling - MRI of the T-spine was concerning for abnormal signal within the thoracic cord extending from lower T3 through the mid T10 level suggestive of edema of indeterminate etiology. - Neurosurgery was consulted and seen by Dr. Danielle DessElsner, did not think neurosurgical issue, recommended IV steroids, PTOT - Appreciate neurology recommendations. MRI of the brain showed no demyelinating disease, vasculitis or infectious/inflammatory process. - MRA showed no spinal AVM or fistula - CSF culture negative. Per neurology, does not appear to be transverse myelitis, CSF showed no WBCs - Still no clear etiology of patient's symptoms however per neurology likely spinal infarct - PTOT recommended CIR, consult placed  Active problems  Urinary retention. Patient recent onset of frequency and difficulty initiating urine flow. More likely neurogenic related to above vs obstruction. Prostate exam within the limits of normal. Foley inserted in the emergency department and  immediately drained 1 L of clear urine -Continue Foley for 1 week then voiding trials -Monitor intake and output  Hypertension.  - Currently stable Not on any antihypertensive medications. -Continue PRN hydralazine   Acute kidney injury. Creatinine 1.3. May be related to decreased oral intake in the setting of urinary retention - Hold nephrotoxins - Continue IV fluid hydration, renal ultrasound negative for any obstruction or hydronephrosis - Creatinine function improving  Constipation. Improved after treatment 2 days ago. States he hasn't had a bowel movement since that time. -Placed on bowel regimen   Code Status: Full CODE STATUS DVT Prophylaxis:   SCD's Family Communication: Discussed in detail with the patient, all imaging results, lab results explained to the patient    Disposition Plan: CIR   Time Spent in minutes   25 minutes  Procedures:    Consultants:   Neurosurgery Neurology  Antimicrobials:      Medications  Scheduled Meds: . methylPREDNISolone (SOLU-MEDROL) injection  1,000 mg Intravenous Q24H  . polyethylene glycol  17 g Oral BID  . senna-docusate  1 tablet Oral BID   Continuous Infusions: . sodium chloride 100 mL/hr at 05/28/16 1009   PRN Meds:.acetaminophen **OR** acetaminophen, hydrALAZINE, HYDROcodone-acetaminophen, ondansetron **OR** ondansetron (ZOFRAN) IV  Antibiotics   Anti-infectives    None        Subjective:   Perry Morrison was seen and examined today. Per patient right lower extremity weakness somewhat improving today. Able to wiggle his toes. Patient denies dizziness, chest pain, shortness of breath, abdominal pain, N/V/D/C.Marland Kitchen No acute events overnight.    Objective:   Vitals:   05/27/16 1836 05/27/16 2100 05/28/16 0113 05/28/16 0615  BP: 135/85 139/76 (!) 147/72 (!) 153/73  Pulse: 99 98 81 78  Resp: 19  20 20   Temp: 98.3 F (36.8 C) 98 F (36.7 C) 99 F (37.2 C) 97.4 F (36.3 C)  TempSrc: Oral Oral Oral Oral   SpO2: 99% 98% 97% 97%  Weight:      Height:        Intake/Output Summary (Last 24 hours) at 05/28/16 1314 Last data filed at 05/28/16 1241  Gross per 24 hour  Intake                0 ml  Output             1350 ml  Net            -1350 ml     Wt Readings from Last 3 Encounters:  05/26/16 102.1 kg (225 lb)     Exam  General: Alert and oriented x 3, NAD  HEENT:    Neck:   Cardiovascular: S1 S2 clear, RRR  Respiratory: Clear to auscultation bilaterally, no wheezing, rales or rhonchi  Gastrointestinal: Soft, nontender, nondistended, + bowel sounds  Ext: no cyanosis clubbing or edema  Neuro: able to wiggle his right toes but still not able to lift his right leg  Skin: No rashes  Psych: Normal affect and demeanor, alert and oriented x3    Data Reviewed:  I have personally reviewed following labs and imaging studies  Micro Results Recent Results (from the past 240 hour(s))  CSF culture     Status: None (Preliminary result)   Collection Time: 05/26/16  4:00 PM  Result Value Ref Range Status   Specimen Description CSF  Final   Special Requests NONE  Final   Gram Stain   Final    CYTOSPIN SMEAR WBC PRESENT,BOTH PMN AND MONONUCLEAR RBC PRESENT NO ORGANISMS SEEN    Culture NO GROWTH < 24 HOURS  Final   Report Status PENDING  Incomplete    Radiology Reports Ct Head Wo Contrast  Result Date: 05/26/2016 CLINICAL DATA:  Right leg numbness with decreased movement of right foot. EXAM: CT HEAD WITHOUT CONTRAST TECHNIQUE: Contiguous axial images were obtained from the base of the skull through the vertex without intravenous contrast. COMPARISON:  None. FINDINGS: Brain: No evidence of acute infarction, hemorrhage, hydrocephalus, extra-axial collection or mass lesion/mass effect. Vascular: No hyperdense vessel or unexpected calcification. Skull: Normal. Negative for fracture or focal lesion. Sinuses/Orbits: No acute finding. Other: None. IMPRESSION: Normal head CT.  Electronically Signed   By: Irish Lack M.D.   On: 05/26/2016 15:21   Mr Laqueta Jean RU Contrast  Result Date: 05/27/2016 CLINICAL DATA:  Evaluate for intracranial white matter abnormalities in a patient with paraplegia. EXAM: MRI HEAD WITHOUT AND WITH CONTRAST TECHNIQUE: Multiplanar, multiecho pulse sequences of the brain and surrounding structures were obtained without and with intravenous contrast. CONTRAST:  20mL MULTIHANCE GADOBENATE DIMEGLUMINE 529 MG/ML IV SOLN COMPARISON:  MRI thoracic spine 05/26/2016. FINDINGS: Brain: No evidence for acute infarction, hemorrhage, mass lesion, hydrocephalus, or extra-axial fluid. Normal for age cerebral volume.  No significant white matter signal abnormality to suggest demyelinating disease, vasculitis, or inflammatory/infectious process. Post infusion, no abnormal enhancement of the brain or meninges. Vascular: Flow voids are maintained throughout the carotid, basilar, and vertebral arteries. There are no areas of chronic hemorrhage. Skull and upper cervical spine: Unremarkable visualized calvarium, skullbase, and cervical vertebrae. Pituitary, pineal, cerebellar tonsils unremarkable. No upper cervical cord lesions. Sinuses/Orbits: No orbital masses or proptosis. Globes appear symmetric. Sinuses appear well aerated, without evidence for air-fluid level. Within limits for assessment on routine brain MR, normal-appearing optic nerves, optic chiasm, and tracts. Other: No nasopharyngeal pathology or mastoid fluid. Scalp and other visualized extracranial soft tissues grossly unremarkable. IMPRESSION: No significant white matter signal abnormalities to suggest demyelinating disease, vasculitis, or infectious/inflammatory process. No abnormal postcontrast enhancement to suggest a granulomatous process such as neurosarcoid. Within limits for assessment on routine brain MR, negative orbits; no evidence for demyelinating process of the anterior optic pathways. Electronically  Signed   By: Elsie Stain M.D.   On: 05/27/2016 17:17   Mr Thoracic Spine W Wo Contrast  Result Date: 05/26/2016 EXAM: MRI THORACIC AND LUMBAR SPINE WITHOUT AND WITH CONTRAST TECHNIQUE: Multiplanar and multiecho pulse sequences of the thoracic and lumbar spine were obtained without and with intravenous contrast. CONTRAST:  20mL MULTIHANCE GADOBENATE DIMEGLUMINE 529 MG/ML IV SOLN COMPARISON:  None. FINDINGS: MRI THORACIC SPINE FINDINGS Alignment:  Minimal curvature. Vertebrae: Mild edema within the right C7 facet possibly degenerative in origin. Cord: Abnormal signal within the cord extends from the lower T3 through the mid T10 level. Additionally, blood breakdown products are noted within the right aspect of the cord extending from the T5-6 level to the lower T9 level (maximal at the T7-8 level). Mild enhancement of the cord T6-7 through upper T8 level. Paraspinal and other soft tissues: Mild dilation thoracic aorta with ascending thoracic aorta measuring up to 4.2 cm. Disc levels: C6-7: Moderate bulge/ osteophyte with mild spinal stenosis and minimal cord flattening. Axial images not obtained. Scattered minimal bulges throughout the thoracic region without large thoracic disc herniation causing cord compression. MRI LUMBAR SPINE FINDINGS Segmentation:  Last fully open disc space labeled L5-S1. Alignment:  Mild straightening and slight curvature. Vertebrae:  Endplate degenerative changes L4-5. Conus medullaris: Extends to the T12-L1 level. Paraspinal and other soft tissues: Tiny right renal cyst. Disc levels: L1-2:  Negative. L2-3: Mild facet degenerative changes. Minimal bulge greater left lateral position touching but not compressing exiting left L2 nerve root. L3-4: Bulge. Facet degenerative changes. Mild bilateral foraminal narrowing. Multifactorial mild spinal stenosis and lateral recess narrowing bilaterally. L4-5: Facet degenerative changes and ligamentum flavum hypertrophy greater on the right. Disc  degeneration with disc space narrowing endplate reactive changes greater on the right. Bulge and osteophyte greater right foraminal/ lateral position encroaching upon exiting right L4 nerve root. Moderate narrowing right lateral thecal sac and right lateral recess. Mild spinal stenosis greater on right. L5-S1: Moderate facet degenerative changes. Bulge with osteophyte. Mild lateral recess narrowing. Mild foraminal narrowing greater on the left. IMPRESSION: MRI THORACIC SPINE Abnormal signal within the thoracic cord extends from the lower T3 through the mid T10 level suggestive of edema. Additionally, blood breakdown products are noted within the right aspect of the cord extending from the T5-6 level to the lower T9 level (maximal at the T7-8 level). Mild enhancement of the cord T6-7 through upper T8 level. Etiology of thoracic cord abnormality is indeterminate. Considerations include: Hemorrhagic transverse myelitis secondary to viral trigger (infection or vaccination). Patient reports possible  right-sided shingles in the past and therefore this may be related to such. Result of myelitis secondary to recent infection (such as influenza) with hemorrhagic conversion or ADEM with hemorrhagic conversion are considerations. Thoracic cord ischemia with hemorrhagic conversion. Given the fact the patient has a slightly dilated ascending thoracic aorta, CT angiogram of the chest abdomen and pelvis may be considered to exclude dissection not detected on the current exam. Vascular malformation such as a cavernoma with bleeding and surrounding edema. Although there is blush like enhancement, appearance is not typical for dural fistula as vessels are not seen along the periphery of the cord. Additionally, symptoms occurred more acutely than expected with dural fistula. Post inflammatory myelitis/vasculitis with bleeding (such as Behcet's disease, sarcoidosis, Wegener's, etc.). Tumor felt last likely consideration (ependymomas  can bleed but usually enhance more). Metastatic disease felt less likely unless the patient has a known malignancy with hemorrhagic propensity such as melanoma. Neuromyelitis optica. Multiple sclerosis with hemorrhagic conversion felt less likely consideration. C6-7 moderate bulge/ osteophyte with mild spinal stenosis and minimal cord flattening. Axial images not obtained. Scattered minimal bulges throughout the thoracic region without large thoracic disc herniation causing cord compression. Mild edema within the right C7 facet possibly degenerative in origin. MRI LUMBAR SPINE Multi-level degenerative changes most prominent L4-5 level as detailed above. These results were called by telephone at the time of interpretation on 05/26/2016 at 12:44 pm to Dr. Karma Ganja, who verbally acknowledged these results. Electronically Signed   By: Lacy Duverney M.D.   On: 05/26/2016 13:15   Mr Lumbar Spine W Wo Contrast  Result Date: 05/26/2016 EXAM: MRI THORACIC AND LUMBAR SPINE WITHOUT AND WITH CONTRAST TECHNIQUE: Multiplanar and multiecho pulse sequences of the thoracic and lumbar spine were obtained without and with intravenous contrast. CONTRAST:  20mL MULTIHANCE GADOBENATE DIMEGLUMINE 529 MG/ML IV SOLN COMPARISON:  None. FINDINGS: MRI THORACIC SPINE FINDINGS Alignment:  Minimal curvature. Vertebrae: Mild edema within the right C7 facet possibly degenerative in origin. Cord: Abnormal signal within the cord extends from the lower T3 through the mid T10 level. Additionally, blood breakdown products are noted within the right aspect of the cord extending from the T5-6 level to the lower T9 level (maximal at the T7-8 level). Mild enhancement of the cord T6-7 through upper T8 level. Paraspinal and other soft tissues: Mild dilation thoracic aorta with ascending thoracic aorta measuring up to 4.2 cm. Disc levels: C6-7: Moderate bulge/ osteophyte with mild spinal stenosis and minimal cord flattening. Axial images not obtained.  Scattered minimal bulges throughout the thoracic region without large thoracic disc herniation causing cord compression. MRI LUMBAR SPINE FINDINGS Segmentation:  Last fully open disc space labeled L5-S1. Alignment:  Mild straightening and slight curvature. Vertebrae:  Endplate degenerative changes L4-5. Conus medullaris: Extends to the T12-L1 level. Paraspinal and other soft tissues: Tiny right renal cyst. Disc levels: L1-2:  Negative. L2-3: Mild facet degenerative changes. Minimal bulge greater left lateral position touching but not compressing exiting left L2 nerve root. L3-4: Bulge. Facet degenerative changes. Mild bilateral foraminal narrowing. Multifactorial mild spinal stenosis and lateral recess narrowing bilaterally. L4-5: Facet degenerative changes and ligamentum flavum hypertrophy greater on the right. Disc degeneration with disc space narrowing endplate reactive changes greater on the right. Bulge and osteophyte greater right foraminal/ lateral position encroaching upon exiting right L4 nerve root. Moderate narrowing right lateral thecal sac and right lateral recess. Mild spinal stenosis greater on right. L5-S1: Moderate facet degenerative changes. Bulge with osteophyte. Mild lateral recess narrowing. Mild foraminal narrowing  greater on the left. IMPRESSION: MRI THORACIC SPINE Abnormal signal within the thoracic cord extends from the lower T3 through the mid T10 level suggestive of edema. Additionally, blood breakdown products are noted within the right aspect of the cord extending from the T5-6 level to the lower T9 level (maximal at the T7-8 level). Mild enhancement of the cord T6-7 through upper T8 level. Etiology of thoracic cord abnormality is indeterminate. Considerations include: Hemorrhagic transverse myelitis secondary to viral trigger (infection or vaccination). Patient reports possible right-sided shingles in the past and therefore this may be related to such. Result of myelitis secondary to  recent infection (such as influenza) with hemorrhagic conversion or ADEM with hemorrhagic conversion are considerations. Thoracic cord ischemia with hemorrhagic conversion. Given the fact the patient has a slightly dilated ascending thoracic aorta, CT angiogram of the chest abdomen and pelvis may be considered to exclude dissection not detected on the current exam. Vascular malformation such as a cavernoma with bleeding and surrounding edema. Although there is blush like enhancement, appearance is not typical for dural fistula as vessels are not seen along the periphery of the cord. Additionally, symptoms occurred more acutely than expected with dural fistula. Post inflammatory myelitis/vasculitis with bleeding (such as Behcet's disease, sarcoidosis, Wegener's, etc.). Tumor felt last likely consideration (ependymomas can bleed but usually enhance more). Metastatic disease felt less likely unless the patient has a known malignancy with hemorrhagic propensity such as melanoma. Neuromyelitis optica. Multiple sclerosis with hemorrhagic conversion felt less likely consideration. C6-7 moderate bulge/ osteophyte with mild spinal stenosis and minimal cord flattening. Axial images not obtained. Scattered minimal bulges throughout the thoracic region without large thoracic disc herniation causing cord compression. Mild edema within the right C7 facet possibly degenerative in origin. MRI LUMBAR SPINE Multi-level degenerative changes most prominent L4-5 level as detailed above. These results were called by telephone at the time of interpretation on 05/26/2016 at 12:44 pm to Dr. Karma Ganja, who verbally acknowledged these results. Electronically Signed   By: Lacy Duverney M.D.   On: 05/26/2016 13:15   US Renal  Result Date: 05/27/2016 CLINICAL DATA:  Acute renal failure EXAM: RENAL / URINARY TRACT ULTRASOUND COMPLETE COMPARISON:  None. FINDINGS: Right Kidney: Length: 10.2 cm. Echogenicity within normal limits. No mass or  hydronephrosis visualized. Left Kidney: Length: 11.2 cm. Echogenicity within normal limits. No mass or hydronephrosis visualized. Bladder: Decompressed by Foley catheter. IMPRESSION: No evidence for hydronephrosis. Electronically Signed   By: Kennith Center M.D.   On: 05/27/2016 17:51   Mr [facility] W Wo Contrast  Result Date: 05/27/2016 CLINICAL DATA:  Thoracic Spinal cord hemorrhagic lesion with edema. Paraplegia. EXAM: MRA SPINE WITHOUT AND WITH CONTRAST TECHNIQUE: Multiplanar and multiecho pulse sequences of the spine were obtained without and with intravenous contrast. Angiographic images of the spine were obtained using MRA technique without and with intravenous contrast. CONTRAST:  See below 20mL MULTIHANCE GADOBENATE DIMEGLUMINE 529 MG/ML IV SOLN COMPARISON:  MRI brain reported separately FINDINGS: There is good opacification of the spinal arteries and veins. No spinal AVM or fistula is identified. The cord remains enlarged, T2 hyperintense, with heterogeneity similar to 05/26/2016. See differential considerations as outlined on that study. IMPRESSION: No spinal AVM or fistula is identified. Electronically Signed   By: Elsie Stain M.D.   On: 05/27/2016 16:56   Dg Abdomen Acute W/chest  Result Date: 05/24/2016 CLINICAL DATA:  Abdominal pain EXAM: DG ABDOMEN ACUTE W/ 1V CHEST COMPARISON:  None. FINDINGS: The lungs are clear.  Cardiomediastinal contours  are normal. No free intraperitoneal air. There is a large amount of stool within the colon. No dilated small bowel is identified. IMPRESSION: 1. Clear lungs. 2. No free intraperitoneal air. 3. Large amount of stool within the colon. No radiographic evidence of small-bowel obstruction. Electronically Signed   By: Deatra Robinson M.D.   On: 05/24/2016 01:57   Ct Angio Chest/abd/pel For Dissection W And/or Wo Contrast  Result Date: 05/26/2016 CLINICAL DATA:  Low back pain and abdominal pain. Evidence for thoracic cord ischemia with hemorrhagic  conversion on recent MRI. CT angiogram was recommended to exclude an aortic dissection. EXAM: CT ANGIOGRAPHY CHEST, ABDOMEN AND PELVIS TECHNIQUE: Multidetector CT imaging through the chest, abdomen and pelvis was performed using the standard protocol during bolus administration of intravenous contrast. Multiplanar reconstructed images and MIPs were obtained and reviewed to evaluate the vascular anatomy. CONTRAST:  100 mL Isovue 370 COMPARISON:  Thoracic spine MRI 05/26/2016 FINDINGS: CTA CHEST FINDINGS Cardiovascular: The aortic root at the sinuses of Valsalva measures 4.5 cm. The mid ascending thoracic aorta is aneurysmal measuring up to 4.2 cm. 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 The great vessels are patent. Negative for an aortic dissection. Proximal descending thoracic aorta measures 3.2 cm. Pulmonary arteries are patent. Mediastinum/Nodes: No evidence for chest lymphadenopathy. No significant pericardial fluid. Lungs/Pleura: Trachea and mainstem bronchi are patent. Focal pleural thickening or nodularity along the left major fissure on sequence 7, image 80 measuring up to 5 mm. Otherwise, the lungs are clear. No significant airspace disease or consolidation. No pleural effusions. Musculoskeletal: No acute bone abnormality. Review of the MIP images confirms the above findings. CTA ABDOMEN AND PELVIS FINDINGS VASCULAR Aorta: Normal caliber aorta without aneurysm, dissection, vasculitis or significant stenosis. Celiac: Patent without evidence of aneurysm, dissection, vasculitis or significant stenosis. SMA: Patent without evidence of aneurysm, dissection, vasculitis or significant stenosis. Renals: Both renal arteries are patent without evidence of aneurysm, dissection, vasculitis, fibromuscular dysplasia or significant stenosis. There is an accessory inferior left renal artery which is patent. IMA: Patent without evidence of aneurysm, dissection,  vasculitis or significant stenosis. Inflow: Mild wall calcifications in the iliac arteries without significant stenosis. Internal and external iliac arteries are patent bilaterally. No dissection. Veins: No obvious venous abnormality within the limitations of this arterial phase study. Review of the MIP images confirms the above findings. NON-VASCULAR Hepatobiliary: No acute abnormality to the liver or gallbladder. Pancreas: Normal appearance of the pancreas without inflammation or duct dilatation. Spleen: Normal appearance of spleen without enlargement. Adrenals/Urinary Tract: Normal adrenal glands. Normal appearance of both kidneys without suspicious lesion or hydronephrosis. Urinary bladder is decompressed with a Foley catheter. Stomach/Bowel: There is a large amount of stool in the right colon and hepatic flexure. No evidence for bowel obstruction or focal inflammation. Lymphatic: No significant lymph node enlargement in the abdomen and pelvis. Soft tissue in the mesentery adjacent to loops of bowel on sequence 6, image 257 is nonspecific and may represent a few mesenteric lymph nodes. Reproductive: Prostate is unremarkable. Seminal vesicles are unremarkable. Other: No significant ascites in the abdomen or pelvis. Musculoskeletal: Mild retrolisthesis at L4-L5 with some disc space narrowing. Refer to recent spine MRI. Review of the MIP images confirms the above findings. IMPRESSION: Negative for an aortic dissection. No acute intrathoracic or intra-abdominal disease. 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 Ascending thoracic aorta measures up to 4.2 cm. Recommend annual imaging followup by CTA or MRA. This recommendation  follows 2010 ACCF/AHA/AATS/ACR/ASA/SCA/SCAI/SIR/STS/SVM Guidelines for the Diagnosis and Management of Patients with Thoracic Aortic Disease. Circulation. 2010; 121: [id number] 5 mm nodule along the left major fissure is indeterminate. No follow-up needed if patient is low-risk.  Non-contrast chest CT can be considered in 12 months if patient is high-risk. This recommendation follows the consensus statement: Guidelines for Management of Incidental Pulmonary Nodules Detected on CT Images: From the Fleischner Society 2017; Radiology 2017; 284:228-243. Large amount of stool involving the right colon. Electronically Signed   By: Richarda Overlie M.D.   On: 05/26/2016 15:24    Lab Data:  CBC:  Recent Labs Lab 05/23/16 2229 05/26/16 0928 05/27/16 0348 05/28/16 0524  WBC 6.2 6.1 5.8 8.6  NEUTROABS  --  3.0  --   --   HGB 14.5 15.1 15.0 14.7  HCT 42.3 43.6 43.9 42.7  MCV 86.2 85.7 85.7 85.9  PLT 216 231 254 262   Basic Metabolic Panel:  Recent Labs Lab 05/23/16 2229 05/26/16 0928 05/27/16 0348 05/28/16 0524  NA 138 135 135 135  K 4.2 4.5 5.1 4.7  CL 102 100* 98* 102  CO2 25 27 23 24   GLUCOSE 103* 112* 133* 156*  BUN 15 13 16  22*  CREATININE 1.32* 1.34* 1.41* 1.29*  CALCIUM 9.6 10.0 10.1 9.8   GFR: Estimated Creatinine Clearance: 82.2 mL/min (by C-G formula based on SCr of 1.29 mg/dL (H)). Liver Function Tests:  Recent Labs Lab 05/23/16 2229  AST 26  ALT 27  ALKPHOS 71  BILITOT 0.7  PROT 7.6  ALBUMIN 4.5    Recent Labs Lab 05/23/16 2229  LIPASE 31   No results for input(s): AMMONIA in the last 168 hours. Coagulation Profile: No results for input(s): INR, PROTIME in the last 168 hours. Cardiac Enzymes: No results for input(s): CKTOTAL, CKMB, CKMBINDEX, TROPONINI in the last 168 hours. BNP (last 3 results) No results for input(s): PROBNP in the last 8760 hours. HbA1C: No results for input(s): HGBA1C in the last 72 hours. CBG: No results for input(s): GLUCAP in the last 168 hours. Lipid Profile: No results for input(s): CHOL, HDL, LDLCALC, TRIG, CHOLHDL, LDLDIRECT in the last 72 hours. Thyroid Function Tests: No results for input(s): TSH, T4TOTAL, FREET4, T3FREE, THYROIDAB in the last 72 hours. Anemia Panel: No results for input(s):  VITAMINB12, FOLATE, FERRITIN, TIBC, IRON, RETICCTPCT in the last 72 hours. Urine analysis:    Component Value Date/Time   COLORURINE YELLOW 05/26/2016 0927   APPEARANCEUR CLEAR 05/26/2016 0927   LABSPEC 1.013 05/26/2016 0927   PHURINE 5.0 05/26/2016 0927   GLUCOSEU NEGATIVE 05/26/2016 0927   HGBUR NEGATIVE 05/26/2016 0927   BILIRUBINUR NEGATIVE 05/26/2016 0927   KETONESUR NEGATIVE 05/26/2016 0927   PROTEINUR NEGATIVE 05/26/2016 0927   NITRITE NEGATIVE 05/26/2016 0927   LEUKOCYTESUR NEGATIVE 05/26/2016 1610     Charlette Hennings M.D. [facility] 05/28/2016, 1:14 PM  Pager: [phone] Between 7am to 7pm - call Pager - [phone]  After 7pm go to [email or web address] - password [email or web address]  Call night coverage person covering after 7pm

## 2016-05-28 NOTE — Progress Notes (Signed)
Rehab admissions - I met with patient and gave him rehab booklets.  He tells me that he is separated from his wife and living with a roommate.  He has no insurance.  He is interested in inpatient rehab admission prior to home with roommate.  I will follow up again tomorrow.  Call me for questions.  #[phone]

## 2016-05-28 NOTE — Progress Notes (Signed)
Occupational Therapy Treatment Patient Details Name: Perry GraffJamie Morrison MRN: [id number] DOB: 05/07/1961 Today's Date: 05/28/2016    History of present illness 56 year old male who had noted some abdominal pain and constipation several days before and was seen in emergency room given some muscle relaxers but then again a half ago started to develop right lower extremity weakness. 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 There is some mass effect in the cord itself from this lesion. The rest of his cervical spine looks normal. The patient had an lumbar puncture pending.   OT comments  Pt making excellent progress. Began educating pt on compensatory techniques and use of AE for LB ADL. Also working on BUE and core strength. Very hard Financial controllerworker. Pt needs a PRAFO for R foot. Pt verbalizing amount of stress he is under given his personal issues. May benefit from pastoral visit. Will continue to follow acutely. Continue to recommend CIR.   Follow Up Recommendations  CIR;Supervision/Assistance - 24 hour    Equipment Recommendations  3 in 1 bedside commode;Tub/shower bench;Wheelchair (measurements OT);Wheelchair cushion (measurements OT)    Recommendations for Other Services Rehab consult  Pastoral Care    Precautions / Restrictions Precautions Precautions: Fall       Mobility Bed Mobility               General bed mobility comments: OOB in chair  Transfers   Equipment used:  (stedy) Transfers: Sit to/from Stand Sit to Stand: Min assist (with Stedy)         General transfer comment: improved ability to use Stedy today    Balance     Sitting balance-Leahy Scale: Fair                             ADL Overall ADL's : Needs assistance/impaired               Lower Body Bathing  Details (indicate cue type and reason): Educated pt on using leg lifter to cross foot over knee to bath       Lower Body Dressing Details (indicate cue type and reason): Educated pt on using leg lifter to cross foot over leg. Pt able to donn/doff B socks in this position Toilet Transfer: Minimal assistance;BSC (sit  - stnad with use of Stedy. BSC over toilet)   Toileting- Clothing Manipulation and Hygiene: Moderate assistance       Functional mobility during ADLs: Minimal assistance (with Stedy) General ADL Comments: Began educating pt on AE for ADL as needed. Pt using leg lifter to help get BLE in proper positiong for bathing/dressing. Discussed bathing @ bed leve at this time. Pt has more control when leaning to R side during pericare.       Vision                     Perception     Praxis      Cognition   Behavior During Therapy: [facility] for tasks assessed/performed Overall Cognitive Status: Within Functional Limits for tasks assessed                       Extremity/Trunk Assessment  Exercises Other Exercises Other Exercises: chair push ups Other Exercises: rythmic stabilization of trunk Other Exercises: core strengthening with reaching acros  midline Other Exercises: propping B elbows on knees to reach feet then returning to upright midline position Other Exercises: Pt complained of neck tightness. Encouraged stretching and heat   Shoulder Instructions       General Comments      Pertinent Vitals/ Pain       Pain Assessment: Faces Faces Pain Scale: Hurts little more Pain Location: neck Pain Descriptors / Indicators: Grimacing Pain Intervention(s): Repositioned;Heat applied  Home Living                                          Prior Functioning/Environment              Frequency  Min 3X/week        Progress Toward Goals  OT Goals(current goals can now be found in the care plan section)  Progress  towards OT goals: Progressing toward goals  Acute Rehab OT Goals Patient Stated Goal: to be able to walk and do for his self OT Goal Formulation: With patient Time For Goal Achievement: 06/10/16 Potential to Achieve Goals: Good ADL Goals Pt Will Perform Lower Body Bathing: with modified independence;with adaptive equipment;sitting/lateral leans Pt Will Perform Lower Body Dressing: with modified independence;with adaptive equipment;sitting/lateral leans Pt Will Transfer to Toilet: with modified independence;squat pivot transfer;bedside commode Pt Will Perform Toileting - Clothing Manipulation and hygiene: with modified independence;sitting/lateral leans;with adaptive equipment Pt Will Perform Tub/Shower Transfer: tub bench;with supervision;Squat pivot transfer  Plan Discharge plan remains appropriate    Co-evaluation                 End of Session Equipment Utilized During Treatment: Gait belt   Activity Tolerance Patient tolerated treatment well   Patient Left Other (comment) (on toilet with Stedy in front of him)   Nurse Communication Mobility status;Need for lift equipment        Time: 1220-1259 OT Time Calculation (min): 39 min  Charges: OT General Charges $OT Visit: 1 Procedure OT Treatments $Self Care/Home Management : 8-22 mins $Neuromuscular Re-education: 8-22 mins $Therapeutic Exercise: 8-22 mins  Courtenay Creger,HILLARY 05/28/2016, 1:37 PM   [facility], OT/L  [phone] 05/28/2016

## 2016-05-28 NOTE — Progress Notes (Signed)
Subjective: Interval History: has no complaint of new neurological symptoms.Marland Kitchen.  He is tolerating the IV solumedrol.  He thinks the right leg might be slightly better.  The results of the imaging studies (MRI brain, MRA spine) were unremarkable and discussed with the patient.   Objective: Vital signs in last 24 hours: Temp:  [97.4 F (36.3 C)-99 F (37.2 C)] 97.9 F (36.6 C) (01/18 1656) Pulse Rate:  [78-99] 82 (01/18 1656) Resp:  [19-20] 20 (01/18 1656) BP: (135-161)/(72-85) 145/72 (01/18 1656) SpO2:  [97 %-100 %] 99 % (01/18 1656)  Intake/Output from previous day: 01/17 0701 - 01/18 0700 In: 957.5 [I.V.:957.5] Out: -  Intake/Output this shift: Total I/O In: 50 [IV Piggyback:50] Out: 1650 [Urine:1650] Nutritional status: Diet Heart Room service appropriate? Yes; Fluid consistency: Thin  NEURO Motor - right leg 1/5 proximal and 0/5 distal; other limbs are normal Sensory - still with area of numbness on torso DTR - diminished on right  Lab Results:  Recent Labs  05/27/16 0348 05/28/16 0524  WBC 5.8 8.6  HGB 15.0 14.7  HCT 43.9 42.7  PLT 254 262  NA 135 135  K 5.1 4.7  CL 98* 102  CO2 23 24  GLUCOSE 133* 156*  BUN 16 22*  CREATININE 1.41* 1.29*  CALCIUM 10.1 9.8   Lipid Panel No results for input(s): CHOL, TRIG, HDL, CHOLHDL, VLDL, LDLCALC in the last 72 hours.  Studies/Results: Mr Laqueta JeanBrain W Wo Contrast  Result Date: 05/27/2016 CLINICAL DATA:  Evaluate for intracranial white matter abnormalities in a patient with paraplegia. EXAM: MRI HEAD WITHOUT AND WITH CONTRAST TECHNIQUE: Multiplanar, multiecho pulse sequences of the brain and surrounding structures were obtained without and with intravenous contrast. CONTRAST:  20mL MULTIHANCE GADOBENATE DIMEGLUMINE 529 MG/ML IV SOLN COMPARISON:  MRI thoracic spine 05/26/2016. FINDINGS: Brain: No evidence for acute infarction, hemorrhage, mass lesion, hydrocephalus, or extra-axial fluid. Normal for age cerebral volume. No  significant white matter signal abnormality to suggest demyelinating disease, vasculitis, or inflammatory/infectious process. Post infusion, no abnormal enhancement of the brain or meninges. Vascular: Flow voids are maintained throughout the carotid, basilar, and vertebral arteries. There are no areas of chronic hemorrhage. Skull and upper cervical spine: Unremarkable visualized calvarium, skullbase, and cervical vertebrae. Pituitary, pineal, cerebellar tonsils unremarkable. No upper cervical cord lesions. Sinuses/Orbits: No orbital masses or proptosis. Globes appear symmetric. Sinuses appear well aerated, without evidence for air-fluid level. Within limits for assessment on routine brain MR, normal-appearing optic nerves, optic chiasm, and tracts. Other: No nasopharyngeal pathology or mastoid fluid. Scalp and other visualized extracranial soft tissues grossly unremarkable. IMPRESSION: No significant white matter signal abnormalities to suggest demyelinating disease, vasculitis, or infectious/inflammatory process. No abnormal postcontrast enhancement to suggest a granulomatous process such as neurosarcoid. Within limits for assessment on routine brain MR, negative orbits; no evidence for demyelinating process of the anterior optic pathways. Electronically Signed   By: Elsie StainJohn T Curnes M.D.   On: 05/27/2016 17:17   [place] Renal  Result Date: 05/27/2016 CLINICAL DATA:  Acute renal failure EXAM: RENAL / URINARY TRACT ULTRASOUND COMPLETE COMPARISON:  None. FINDINGS: Right Kidney: Length: 10.2 cm. Echogenicity within normal limits. No mass or hydronephrosis visualized. Left Kidney: Length: 11.2 cm. Echogenicity within normal limits. No mass or hydronephrosis visualized. Bladder: Decompressed by Foley catheter. IMPRESSION: No evidence for hydronephrosis. Electronically Signed   By: Kennith CenterEric  Mansell M.D.   On: 05/27/2016 17:51   [facility] Spinal Canal W Wo Contrast  Result Date: 05/27/2016 CLINICAL DATA:  Thoracic Spinal cord  hemorrhagic lesion with edema. Paraplegia. EXAM: MRA SPINE WITHOUT AND WITH CONTRAST TECHNIQUE: Multiplanar and multiecho pulse sequences of the spine were obtained without and with intravenous contrast. Angiographic images of the spine were obtained using MRA technique without and with intravenous contrast. CONTRAST:  See below 20mL MULTIHANCE GADOBENATE DIMEGLUMINE 529 MG/ML IV SOLN COMPARISON:  MRI brain reported separately FINDINGS: There is good opacification of the spinal arteries and veins. No spinal AVM or fistula is identified. The cord remains enlarged, T2 hyperintense, with heterogeneity similar to 05/26/2016. See differential considerations as outlined on that study. IMPRESSION: No spinal AVM or fistula is identified. Electronically Signed   By: Elsie Stain M.D.   On: 05/27/2016 16:56    Medications: I have reviewed the patient's current medications.  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 Also, neurosurgery does not think there is any surgical option. 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 On IV solumdrol D2/5 and will continue this treatment.  Patient will need oral steroid taper afterwards.   LOS: 2 days   Dr. Benedict Needy [facility] [phone]  05/28/2016, 5:54 PM

## 2016-05-29 LAB — CBC
HCT: 39.6 % (ref 39.0–52.0)
Hemoglobin: 13.5 g/dL (ref 13.0–17.0)
MCH: 29.6 pg (ref 26.0–34.0)
MCHC: 34.1 g/dL (ref 30.0–36.0)
MCV: 86.8 fL (ref 78.0–100.0)
PLATELETS: 237 10*3/uL (ref 150–400)
RBC: 4.56 MIL/uL (ref 4.22–5.81)
RDW: 13.2 % (ref 11.5–15.5)
WBC: 11.2 10*3/uL — AB (ref 4.0–10.5)

## 2016-05-29 LAB — BASIC METABOLIC PANEL
Anion gap: 10 (ref 5–15)
BUN: 27 mg/dL — AB (ref 6–20)
CO2: 24 mmol/L (ref 22–32)
CREATININE: 1.25 mg/dL — AB (ref 0.61–1.24)
Calcium: 9.4 mg/dL (ref 8.9–10.3)
Chloride: 105 mmol/L (ref 101–111)
Glucose, Bld: 141 mg/dL — ABNORMAL HIGH (ref 65–99)
Potassium: 4 mmol/L (ref 3.5–5.1)
SODIUM: 139 mmol/L (ref 135–145)

## 2016-05-29 LAB — CSF CULTURE

## 2016-05-29 LAB — CSF CULTURE W GRAM STAIN: Culture: NO GROWTH

## 2016-05-29 MED ORDER — AMLODIPINE BESYLATE 5 MG PO TABS
5.0000 mg | ORAL_TABLET | Freq: Every day | ORAL | Status: DC
Start: 2016-05-29 — End: 2016-05-30
  Administered 2016-05-29 – 2016-05-30 (×2): 5 mg via ORAL
  Filled 2016-05-29 (×2): qty 1

## 2016-05-29 NOTE — Progress Notes (Signed)
[facility]                                                                              Patient Demographics  Perry Morrison, is a 56 y.o. male, DOB - [birth year], WUJ:[id number]  Admit date - 05/26/2016   Admitting Physician Ozella Rocks, MD  Outpatient Primary MD for the patient is No PCP Per Patient  Outpatient specialists:   LOS - 3  days    Chief Complaint  Patient presents with  . Leg Pain  . Abdominal Pain  . Urinary Frequency       Brief summary   Perry Morrison is a 56 y.o. male with medical history significant for hypertension this emergency Department chief complaint of persistent abdominal pain and sudden onset right foot leg weakness/numbness tingling.  MRI of the T-spine was concerning for abnormal signal within the thoracic cord extending from lower T3 through the mid T10 level suggestive of edema of indeterminate etiology.  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 - Neurosurgery was consulted and seen by Dr. Danielle Dess, did not think neurosurgical issue, recommended IV steroids, PTOT - Appreciate neurology recommendations. MRI of the brain showed no demyelinating disease, vasculitis or infectious/inflammatory process.  MRA showed no spinal AVM or fistula - CSF culture negative.  - Per neurology recommendations, on high-dose IV Solu-Medrol day #3/5, will need oral steroid taper after completion - Continue PTOT  Active problems  Urinary retention. Patient recent onset of frequency and difficulty initiating urine flow. More likely neurogenic related to above vs obstruction. Prostate exam within the limits of normal. Foley inserted in the emergency department and immediately  drained 1 L of clear urine -Continue Foley for 1 week then voiding trials -Monitor intake and output  Hypertension.  - Currently stable Not on any antihypertensive medications PTA, started on Norvasc. -Continue PRN hydralazine   Acute kidney injury. Creatinine 1.3. May be related to decreased oral intake in the setting of urinary retention - Hold nephrotoxins - Continue IV fluid hydration, renal ultrasound negative for any obstruction or hydronephrosis - Creatinine function improving   Constipation.  -Placed on bowel regimen   Code Status: Full CODE STATUS DVT Prophylaxis:   SCD's Family Communication: Discussed in detail with the patient, all imaging results, lab results explained to the patient    Disposition Plan: CIR   Time Spent in minutes   25 minutes  Procedures:    Consultants:   Neurosurgery Neurology  Antimicrobials:      Medications  Scheduled Meds: . methylPREDNISolone (SOLU-MEDROL) injection  1,000 mg Intravenous Q24H  . polyethylene glycol  17 g Oral BID  . senna-docusate  1 tablet Oral BID   Continuous Infusions: . sodium chloride 100 mL/hr at 05/29/16 0556   PRN Meds:.acetaminophen **OR** acetaminophen, hydrALAZINE, HYDROcodone-acetaminophen, ondansetron **OR** ondansetron (ZOFRAN) IV   Antibiotics   Anti-infectives    None  Subjective:   Perry Morrison was seen and examined today.Per patient no significant improvement in the right lower extremity weakness. Patient denies dizziness, chest pain, shortness of breath, abdominal pain, N/V/D/C.Marland Kitchen No acute events overnight.    Objective:   Vitals:   05/28/16 2100 05/29/16 0101 05/29/16 0552 05/29/16 1028  BP: (!) 154/75 (!) 141/73 (!) 155/84 (!) 159/77  Pulse: 75 73 74 87  Resp:  14 16 17   Temp: 98.4 F (36.9 C) 98.5 F (36.9 C) 98.6 F (37 C) 98 F (36.7 C)  TempSrc: Oral Oral Oral Oral  SpO2: 98% 97% 96% 100%  Weight:      Height:        Intake/Output Summary (Last  24 hours) at 05/29/16 1336 Last data filed at 05/29/16 1100  Gross per 24 hour  Intake             1490 ml  Output             1400 ml  Net               90 ml     Wt Readings from Last 3 Encounters:  05/26/16 102.1 kg (225 lb)     Exam  General: Alert and oriented x 3, NAD  HEENT:    Neck:   Cardiovascular: S1 S2 clear, RRR  Respiratory: CTAB  Gastrointestinal: Soft, nontender, nondistended, + bowel sounds  Ext: no cyanosis clubbing or edema  Neuro:  not able to lift his right leg  Skin: No rashes  Psych: Normal affect and demeanor, alert and oriented x3    Data Reviewed:  I have personally reviewed following labs and imaging studies  Micro Results Recent Results (from the past 240 hour(s))  CSF culture     Status: None   Collection Time: 05/26/16  4:00 PM  Result Value Ref Range Status   Specimen Description CSF  Final   Special Requests NONE  Final   Gram Stain   Final    CYTOSPIN SMEAR WBC PRESENT,BOTH PMN AND MONONUCLEAR RBC PRESENT NO ORGANISMS SEEN    Culture NO GROWTH 3 DAYS  Final   Report Status 05/29/2016 FINAL  Final    Radiology Reports Ct Head Wo Contrast  Result Date: 05/26/2016 CLINICAL DATA:  Right leg numbness with decreased movement of right foot. EXAM: CT HEAD WITHOUT CONTRAST TECHNIQUE: Contiguous axial images were obtained from the base of the skull through the vertex without intravenous contrast. COMPARISON:  None. FINDINGS: Brain: No evidence of acute infarction, hemorrhage, hydrocephalus, extra-axial collection or mass lesion/mass effect. Vascular: No hyperdense vessel or unexpected calcification. Skull: Normal. Negative for fracture or focal lesion. Sinuses/Orbits: No acute finding. Other: None. IMPRESSION: Normal head CT. Electronically Signed   By: Irish Lack M.D.   On: 05/26/2016 15:21   Mr Laqueta Jean WU Contrast  Result Date: 05/27/2016 CLINICAL DATA:  Evaluate for intracranial white matter abnormalities in a patient with  paraplegia. EXAM: MRI HEAD WITHOUT AND WITH CONTRAST TECHNIQUE: Multiplanar, multiecho pulse sequences of the brain and surrounding structures were obtained without and with intravenous contrast. CONTRAST:  20mL MULTIHANCE GADOBENATE DIMEGLUMINE 529 MG/ML IV SOLN COMPARISON:  MRI thoracic spine 05/26/2016. FINDINGS: Brain: No evidence for acute infarction, hemorrhage, mass lesion, hydrocephalus, or extra-axial fluid. Normal for age cerebral volume. No significant white matter signal abnormality to suggest demyelinating disease, vasculitis, or inflammatory/infectious process. Post infusion, no abnormal enhancement of the brain or meninges. Vascular: Flow voids are maintained throughout the carotid, basilar, and  vertebral arteries. There are no areas of chronic hemorrhage. Skull and upper cervical spine: Unremarkable visualized calvarium, skullbase, and cervical vertebrae. Pituitary, pineal, cerebellar tonsils unremarkable. No upper cervical cord lesions. Sinuses/Orbits: No orbital masses or proptosis. Globes appear symmetric. Sinuses appear well aerated, without evidence for air-fluid level. Within limits for assessment on routine brain MR, normal-appearing optic nerves, optic chiasm, and tracts. Other: No nasopharyngeal pathology or mastoid fluid. Scalp and other visualized extracranial soft tissues grossly unremarkable. IMPRESSION: No significant white matter signal abnormalities to suggest demyelinating disease, vasculitis, or infectious/inflammatory process. No abnormal postcontrast enhancement to suggest a granulomatous process such as neurosarcoid. Within limits for assessment on routine brain MR, negative orbits; no evidence for demyelinating process of the anterior optic pathways. Electronically Signed   By: Elsie Stain M.D.   On: 05/27/2016 17:17   Mr Thoracic Spine W Wo Contrast  Result Date: 05/26/2016 EXAM: MRI THORACIC AND LUMBAR SPINE WITHOUT AND WITH CONTRAST TECHNIQUE: Multiplanar and multiecho  pulse sequences of the thoracic and lumbar spine were obtained without and with intravenous contrast. CONTRAST:  20mL MULTIHANCE GADOBENATE DIMEGLUMINE 529 MG/ML IV SOLN COMPARISON:  None. FINDINGS: MRI THORACIC SPINE FINDINGS Alignment:  Minimal curvature. Vertebrae: Mild edema within the right C7 facet possibly degenerative in origin. Cord: Abnormal signal within the cord extends from the lower T3 through the mid T10 level. Additionally, blood breakdown products are noted within the right aspect of the cord extending from the T5-6 level to the lower T9 level (maximal at the T7-8 level). Mild enhancement of the cord T6-7 through upper T8 level. Paraspinal and other soft tissues: Mild dilation thoracic aorta with ascending thoracic aorta measuring up to 4.2 cm. Disc levels: C6-7: Moderate bulge/ osteophyte with mild spinal stenosis and minimal cord flattening. Axial images not obtained. Scattered minimal bulges throughout the thoracic region without large thoracic disc herniation causing cord compression. MRI LUMBAR SPINE FINDINGS Segmentation:  Last fully open disc space labeled L5-S1. Alignment:  Mild straightening and slight curvature. Vertebrae:  Endplate degenerative changes L4-5. Conus medullaris: Extends to the T12-L1 level. Paraspinal and other soft tissues: Tiny right renal cyst. Disc levels: L1-2:  Negative. L2-3: Mild facet degenerative changes. Minimal bulge greater left lateral position touching but not compressing exiting left L2 nerve root. L3-4: Bulge. Facet degenerative changes. Mild bilateral foraminal narrowing. Multifactorial mild spinal stenosis and lateral recess narrowing bilaterally. L4-5: Facet degenerative changes and ligamentum flavum hypertrophy greater on the right. Disc degeneration with disc space narrowing endplate reactive changes greater on the right. Bulge and osteophyte greater right foraminal/ lateral position encroaching upon exiting right L4 nerve root. Moderate narrowing right  lateral thecal sac and right lateral recess. Mild spinal stenosis greater on right. L5-S1: Moderate facet degenerative changes. Bulge with osteophyte. Mild lateral recess narrowing. Mild foraminal narrowing greater on the left. IMPRESSION: MRI THORACIC SPINE Abnormal signal within the thoracic cord extends from the lower T3 through the mid T10 level suggestive of edema. Additionally, blood breakdown products are noted within the right aspect of the cord extending from the T5-6 level to the lower T9 level (maximal at the T7-8 level). Mild enhancement of the cord T6-7 through upper T8 level. Etiology of thoracic cord abnormality is indeterminate. Considerations include: Hemorrhagic transverse myelitis secondary to viral trigger (infection or vaccination). Patient reports possible right-sided shingles in the past and therefore this may be related to such. Result of myelitis secondary to recent infection (such as influenza) with hemorrhagic conversion or ADEM with hemorrhagic conversion are considerations. Thoracic  cord ischemia with hemorrhagic conversion. Given the fact the patient has a slightly dilated ascending thoracic aorta, CT angiogram of the chest abdomen and pelvis may be considered to exclude dissection not detected on the current exam. Vascular malformation such as a cavernoma with bleeding and surrounding edema. Although there is blush like enhancement, appearance is not typical for dural fistula as vessels are not seen along the periphery of the cord. Additionally, symptoms occurred more acutely than expected with dural fistula. Post inflammatory myelitis/vasculitis with bleeding (such as Behcet's disease, sarcoidosis, Morrison's, etc.). Tumor felt last likely consideration (ependymomas can bleed but usually enhance more). Metastatic disease felt less likely unless the patient has a known malignancy with hemorrhagic propensity such as melanoma. Neuromyelitis optica. Multiple sclerosis with hemorrhagic  conversion felt less likely consideration. C6-7 moderate bulge/ osteophyte with mild spinal stenosis and minimal cord flattening. Axial images not obtained. Scattered minimal bulges throughout the thoracic region without large thoracic disc herniation causing cord compression. Mild edema within the right C7 facet possibly degenerative in origin. MRI LUMBAR SPINE Multi-level degenerative changes most prominent L4-5 level as detailed above. These results were called by telephone at the time of interpretation on 05/26/2016 at 12:44 pm to Dr. Karma Ganja, who verbally acknowledged these results. Electronically Signed   By: Lacy Duverney M.D.   On: 05/26/2016 13:15   Mr Lumbar Spine W Wo Contrast  Result Date: 05/26/2016 EXAM: MRI THORACIC AND LUMBAR SPINE WITHOUT AND WITH CONTRAST TECHNIQUE: Multiplanar and multiecho pulse sequences of the thoracic and lumbar spine were obtained without and with intravenous contrast. CONTRAST:  20mL MULTIHANCE GADOBENATE DIMEGLUMINE 529 MG/ML IV SOLN COMPARISON:  None. FINDINGS: MRI THORACIC SPINE FINDINGS Alignment:  Minimal curvature. Vertebrae: Mild edema within the right C7 facet possibly degenerative in origin. Cord: Abnormal signal within the cord extends from the lower T3 through the mid T10 level. Additionally, blood breakdown products are noted within the right aspect of the cord extending from the T5-6 level to the lower T9 level (maximal at the T7-8 level). Mild enhancement of the cord T6-7 through upper T8 level. Paraspinal and other soft tissues: Mild dilation thoracic aorta with ascending thoracic aorta measuring up to 4.2 cm. Disc levels: C6-7: Moderate bulge/ osteophyte with mild spinal stenosis and minimal cord flattening. Axial images not obtained. Scattered minimal bulges throughout the thoracic region without large thoracic disc herniation causing cord compression. MRI LUMBAR SPINE FINDINGS Segmentation:  Last fully open disc space labeled L5-S1. Alignment:  Mild  straightening and slight curvature. Vertebrae:  Endplate degenerative changes L4-5. Conus medullaris: Extends to the T12-L1 level. Paraspinal and other soft tissues: Tiny right renal cyst. Disc levels: L1-2:  Negative. L2-3: Mild facet degenerative changes. Minimal bulge greater left lateral position touching but not compressing exiting left L2 nerve root. L3-4: Bulge. Facet degenerative changes. Mild bilateral foraminal narrowing. Multifactorial mild spinal stenosis and lateral recess narrowing bilaterally. L4-5: Facet degenerative changes and ligamentum flavum hypertrophy greater on the right. Disc degeneration with disc space narrowing endplate reactive changes greater on the right. Bulge and osteophyte greater right foraminal/ lateral position encroaching upon exiting right L4 nerve root. Moderate narrowing right lateral thecal sac and right lateral recess. Mild spinal stenosis greater on right. L5-S1: Moderate facet degenerative changes. Bulge with osteophyte. Mild lateral recess narrowing. Mild foraminal narrowing greater on the left. IMPRESSION: MRI THORACIC SPINE Abnormal signal within the thoracic cord extends from the lower T3 through the mid T10 level suggestive of edema. Additionally, blood breakdown products are noted within  the right aspect of the cord extending from the T5-6 level to the lower T9 level (maximal at the T7-8 level). Mild enhancement of the cord T6-7 through upper T8 level. Etiology of thoracic cord abnormality is indeterminate. Considerations include: Hemorrhagic transverse myelitis secondary to viral trigger (infection or vaccination). Patient reports possible right-sided shingles in the past and therefore this may be related to such. Result of myelitis secondary to recent infection (such as influenza) with hemorrhagic conversion or ADEM with hemorrhagic conversion are considerations. Thoracic cord ischemia with hemorrhagic conversion. Given the fact the patient has a slightly dilated  ascending thoracic aorta, CT angiogram of the chest abdomen and pelvis may be considered to exclude dissection not detected on the current exam. Vascular malformation such as a cavernoma with bleeding and surrounding edema. Although there is blush like enhancement, appearance is not typical for dural fistula as vessels are not seen along the periphery of the cord. Additionally, symptoms occurred more acutely than expected with dural fistula. Post inflammatory myelitis/vasculitis with bleeding (such as Behcet's disease, sarcoidosis, Morrison's, etc.). Tumor felt last likely consideration (ependymomas can bleed but usually enhance more). Metastatic disease felt less likely unless the patient has a known malignancy with hemorrhagic propensity such as melanoma. Neuromyelitis optica. Multiple sclerosis with hemorrhagic conversion felt less likely consideration. C6-7 moderate bulge/ osteophyte with mild spinal stenosis and minimal cord flattening. Axial images not obtained. Scattered minimal bulges throughout the thoracic region without large thoracic disc herniation causing cord compression. Mild edema within the right C7 facet possibly degenerative in origin. MRI LUMBAR SPINE Multi-level degenerative changes most prominent L4-5 level as detailed above. These results were called by telephone at the time of interpretation on 05/26/2016 at 12:44 pm to Dr. Karma Ganja, who verbally acknowledged these results. Electronically Signed   By: Lacy Duverney M.D.   On: 05/26/2016 13:15   US Renal  Result Date: 05/27/2016 CLINICAL DATA:  Acute renal failure EXAM: RENAL / URINARY TRACT ULTRASOUND COMPLETE COMPARISON:  None. FINDINGS: Right Kidney: Length: 10.2 cm. Echogenicity within normal limits. No mass or hydronephrosis visualized. Left Kidney: Length: 11.2 cm. Echogenicity within normal limits. No mass or hydronephrosis visualized. Bladder: Decompressed by Foley catheter. IMPRESSION: No evidence for hydronephrosis. Electronically  Signed   By: Kennith Center M.D.   On: 05/27/2016 17:51   Mr [employer] W Wo Contrast  Result Date: 05/27/2016 CLINICAL DATA:  Thoracic Spinal cord hemorrhagic lesion with edema. Paraplegia. EXAM: MRA SPINE WITHOUT AND WITH CONTRAST TECHNIQUE: Multiplanar and multiecho pulse sequences of the spine were obtained without and with intravenous contrast. Angiographic images of the spine were obtained using MRA technique without and with intravenous contrast. CONTRAST:  See below 20mL MULTIHANCE GADOBENATE DIMEGLUMINE 529 MG/ML IV SOLN COMPARISON:  MRI brain reported separately FINDINGS: There is good opacification of the spinal arteries and veins. No spinal AVM or fistula is identified. The cord remains enlarged, T2 hyperintense, with heterogeneity similar to 05/26/2016. See differential considerations as outlined on that study. IMPRESSION: No spinal AVM or fistula is identified. Electronically Signed   By: Elsie Stain M.D.   On: 05/27/2016 16:56   Dg Abdomen Acute W/chest  Result Date: 05/24/2016 CLINICAL DATA:  Abdominal pain EXAM: DG ABDOMEN ACUTE W/ 1V CHEST COMPARISON:  None. FINDINGS: The lungs are clear.  Cardiomediastinal contours are normal. No free intraperitoneal air. There is a large amount of stool within the colon. No dilated small bowel is identified. IMPRESSION: 1. Clear lungs. 2. No free intraperitoneal air. 3. Large amount  of stool within the colon. No radiographic evidence of small-bowel obstruction. Electronically Signed   By: Deatra RobinsonKevin  Herman M.D.   On: 05/24/2016 01:57   Ct Angio Chest/abd/pel For Dissection W And/or Wo Contrast  Result Date: 05/26/2016 CLINICAL DATA:  Low back pain and abdominal pain. Evidence for thoracic cord ischemia with hemorrhagic conversion on recent MRI. CT angiogram was recommended to exclude an aortic dissection. EXAM: CT ANGIOGRAPHY CHEST, ABDOMEN AND PELVIS TECHNIQUE: Multidetector CT imaging through the chest, abdomen and pelvis was performed using the  standard protocol during bolus administration of intravenous contrast. Multiplanar reconstructed images and MIPs were obtained and reviewed to evaluate the vascular anatomy. CONTRAST:  100 mL Isovue 370 COMPARISON:  Thoracic spine MRI 05/26/2016 FINDINGS: CTA CHEST FINDINGS Cardiovascular: The aortic root at the sinuses of Valsalva measures 4.5 cm. The mid ascending thoracic aorta is aneurysmal measuring up to 4.2 cm. 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 The great vessels are patent. Negative for an aortic dissection. Proximal descending thoracic aorta measures 3.2 cm. Pulmonary arteries are patent. Mediastinum/Nodes: No evidence for chest lymphadenopathy. No significant pericardial fluid. Lungs/Pleura: Trachea and mainstem bronchi are patent. Focal pleural thickening or nodularity along the left major fissure on sequence 7, image 80 measuring up to 5 mm. Otherwise, the lungs are clear. No significant airspace disease or consolidation. No pleural effusions. Musculoskeletal: No acute bone abnormality. Review of the MIP images confirms the above findings. CTA ABDOMEN AND PELVIS FINDINGS VASCULAR Aorta: Normal caliber aorta without aneurysm, dissection, vasculitis or significant stenosis. Celiac: Patent without evidence of aneurysm, dissection, vasculitis or significant stenosis. SMA: Patent without evidence of aneurysm, dissection, vasculitis or significant stenosis. Renals: Both renal arteries are patent without evidence of aneurysm, dissection, vasculitis, fibromuscular dysplasia or significant stenosis. There is an accessory inferior left renal artery which is patent. IMA: Patent without evidence of aneurysm, dissection, vasculitis or significant stenosis. Inflow: Mild wall calcifications in the iliac arteries without significant stenosis. Internal and external iliac arteries are patent bilaterally. No dissection. Veins: No obvious venous abnormality  within the limitations of this arterial phase study. Review of the MIP images confirms the above findings. NON-VASCULAR Hepatobiliary: No acute abnormality to the liver or gallbladder. Pancreas: Normal appearance of the pancreas without inflammation or duct dilatation. Spleen: Normal appearance of spleen without enlargement. Adrenals/Urinary Tract: Normal adrenal glands. Normal appearance of both kidneys without suspicious lesion or hydronephrosis. Urinary bladder is decompressed with a Foley catheter. Stomach/Bowel: There is a large amount of stool in the right colon and hepatic flexure. No evidence for bowel obstruction or focal inflammation. Lymphatic: No significant lymph node enlargement in the abdomen and pelvis. Soft tissue in the mesentery adjacent to loops of bowel on sequence 6, image 257 is nonspecific and may represent a few mesenteric lymph nodes. Reproductive: Prostate is unremarkable. Seminal vesicles are unremarkable. Other: No significant ascites in the abdomen or pelvis. Musculoskeletal: Mild retrolisthesis at L4-L5 with some disc space narrowing. Refer to recent spine MRI. Review of the MIP images confirms the above findings. IMPRESSION: Negative for an aortic dissection. No acute intrathoracic or intra-abdominal disease. 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 Ascending thoracic aorta measures up to 4.2 cm. Recommend annual imaging followup by CTA or MRA. This recommendation follows 2010 ACCF/AHA/AATS/ACR/ASA/SCA/SCAI/SIR/STS/SVM Guidelines for the Diagnosis and Management of Patients with Thoracic Aortic Disease. Circulation. 2010; 121: [id number] 5 mm nodule along the left major fissure is indeterminate. No follow-up needed if patient  is low-risk. Non-contrast chest CT can be considered in 12 months if patient is high-risk. This recommendation follows the consensus statement: Guidelines for Management of Incidental Pulmonary Nodules Detected on CT Images: From the Fleischner  Society 2017; Radiology 2017; 284:228-243. Large amount of stool involving the right colon. Electronically Signed   By: Richarda Overlie M.D.   On: 05/26/2016 15:24    Lab Data:  CBC:  Recent Labs Lab 05/23/16 2229 05/26/16 0928 05/27/16 0348 05/28/16 0524 05/29/16 1036  WBC 6.2 6.1 5.8 8.6 11.2*  NEUTROABS  --  3.0  --   --   --   HGB 14.5 15.1 15.0 14.7 13.5  HCT 42.3 43.6 43.9 42.7 39.6  MCV 86.2 85.7 85.7 85.9 86.8  PLT 216 231 254 262 237   Basic Metabolic Panel:  Recent Labs Lab 05/23/16 2229 05/26/16 0928 05/27/16 0348 05/28/16 0524 05/29/16 1036  NA 138 135 135 135 139  K 4.2 4.5 5.1 4.7 4.0  CL 102 100* 98* 102 105  CO2 25 27 23 24 24   GLUCOSE 103* 112* 133* 156* 141*  BUN 15 13 16  22* 27*  CREATININE 1.32* 1.34* 1.41* 1.29* 1.25*  CALCIUM 9.6 10.0 10.1 9.8 9.4   GFR: Estimated Creatinine Clearance: 84.9 mL/min (by C-G formula based on SCr of 1.25 mg/dL (H)). Liver Function Tests:  Recent Labs Lab 05/23/16 2229  AST 26  ALT 27  ALKPHOS 71  BILITOT 0.7  PROT 7.6  ALBUMIN 4.5    Recent Labs Lab 05/23/16 2229  LIPASE 31   No results for input(s): AMMONIA in the last 168 hours. Coagulation Profile: No results for input(s): INR, PROTIME in the last 168 hours. Cardiac Enzymes: No results for input(s): CKTOTAL, CKMB, CKMBINDEX, TROPONINI in the last 168 hours. BNP (last 3 results) No results for input(s): PROBNP in the last 8760 hours. HbA1C: No results for input(s): HGBA1C in the last 72 hours. CBG: No results for input(s): GLUCAP in the last 168 hours. Lipid Profile: No results for input(s): CHOL, HDL, LDLCALC, TRIG, CHOLHDL, LDLDIRECT in the last 72 hours. Thyroid Function Tests: No results for input(s): TSH, T4TOTAL, FREET4, T3FREE, THYROIDAB in the last 72 hours. Anemia Panel: No results for input(s): VITAMINB12, FOLATE, FERRITIN, TIBC, IRON, RETICCTPCT in the last 72 hours. Urine analysis:    Component Value Date/Time   COLORURINE  YELLOW 05/26/2016 0927   APPEARANCEUR CLEAR 05/26/2016 0927   LABSPEC 1.013 05/26/2016 0927   PHURINE 5.0 05/26/2016 0927   GLUCOSEU NEGATIVE 05/26/2016 0927   HGBUR NEGATIVE 05/26/2016 0927   BILIRUBINUR NEGATIVE 05/26/2016 0927   KETONESUR NEGATIVE 05/26/2016 0927   PROTEINUR NEGATIVE 05/26/2016 0927   NITRITE NEGATIVE 05/26/2016 0927   LEUKOCYTESUR NEGATIVE 05/26/2016 1610     RAI,RIPUDEEP M.D. [facility] 05/29/2016, 1:36 PM  Pager: [phone] Between 7am to 7pm - call Pager - [phone]  After 7pm go to [email or web address] - password [email or web address]  Call night coverage person covering after 7pm

## 2016-05-29 NOTE — Progress Notes (Signed)
Subjective: Interval History: none; no new complaints.  No additional studies to review.  Objective: Vital signs in last 24 hours: Temp:  [97.3 F (36.3 C)-98.6 F (37 C)] 97.3 F (36.3 C) (01/19 1340) Pulse Rate:  [73-88] 88 (01/19 1340) Resp:  [14-20] 18 (01/19 1340) BP: (141-160)/(72-84) 160/72 (01/19 1340) SpO2:  [96 %-100 %] 100 % (01/19 1340)  Intake/Output from previous day: 01/18 0701 - 01/19 0700 In: 1250 [I.V.:1200; IV Piggyback:50] Out: 2750 [Urine:2750] Intake/Output this shift: Total I/O In: 1190 [P.O.:240; I.V.:900; IV Piggyback:50] Out: -  Nutritional status: Diet Heart Room service appropriate? Yes; Fluid consistency: Thin  NEURO Motor - right leg 1/5 proximal and 0/5 distal; other limbs are normal Sensory - still with area of numbness on torso DTR - diminished on right  Lab Results:  Recent Labs  05/28/16 0524 05/29/16 1036  WBC 8.6 11.2*  HGB 14.7 13.5  HCT 42.7 39.6  PLT 262 237  NA 135 139  K 4.7 4.0  CL 102 105  CO2 24 24  GLUCOSE 156* 141*  BUN 22* 27*  CREATININE 1.29* 1.25*  CALCIUM 9.8 9.4   Lipid Panel No results for input(s): CHOL, TRIG, HDL, CHOLHDL, VLDL, LDLCALC in the last 72 hours.  Studies/Results: [place] Renal  Result Date: 05/27/2016 CLINICAL DATA:  Acute renal failure EXAM: RENAL / URINARY TRACT ULTRASOUND COMPLETE COMPARISON:  None. FINDINGS: Right Kidney: Length: 10.2 cm. Echogenicity within normal limits. No mass or hydronephrosis visualized. Left Kidney: Length: 11.2 cm. Echogenicity within normal limits. No mass or hydronephrosis visualized. Bladder: Decompressed by Foley catheter. IMPRESSION: No evidence for hydronephrosis. Electronically Signed   By: Kennith CenterEric  Mansell M.D.   On: 05/27/2016 17:51    Medications: I have reviewed the patient's current medications.  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 Also, neurosurgery does not think there is  any surgical option. 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 On IV solumdrol D3/5 and will continue this treatment. Patient has not shown any significant improvement thus far. Patient will need oral steroid taper afterwards.   LOS: 3 days   Dr. Benedict NeedyNath [facility] [phone]  05/29/2016, 4:25 PM

## 2016-05-29 NOTE — Progress Notes (Signed)
Rehab admissions - I spoke with Dr. Isidoro Donningai today and patient not ready for inpatient rehab today.  I will follow up again on Monday.  Call me for questions.  #[phone]

## 2016-05-29 NOTE — PMR Pre-admission (Signed)
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 Per chart review patient currently living with afemaleroommatethat works during the day. He is going through a divorce and recently lost his job. Independent prior to admission.Most of his family is in the [place] area.Presented 05/26/2016 with abdominal pain constipation times several days. He was initially seen in the emergency room given some muscle relaxers but then developed right lower extremity weakness. An MRI of the thoracic spine demonstrated swelling in the midportion of the cord at T5 level with some right sided lesion consistent with hemorrhage within the cord down to the level of T9. There was some mass effect in the cord itself from the lesion. No spinal AVM or fistula identified. The rest of the cervical spine film was unremarkable. Cranial CT scan negative. MRI of the brain showed no significant white matter signal abnormalities to suggest demyelinating disease. Lumbar puncture completed total protein 81, glucose 56, CSF showed no white cells. Neurosurgery as well as neurology consulted etiology suggestive of spinal cord infarct/Atypical  transverse myelitis versus Thoracic myelopathy. Placed on intravenous Solu-Medrol . Physical and occupational therapy evaluations completed 05/27/2016 with recommendations of physical medicine rehabilitation consult. Patient to be admitted for a comprehensive inpatient rehabilitation program.  Past Medical History  Past Medical History:  Diagnosis Date  . AKI (acute kidney injury) (HCC)   . 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 Hypertension   . Myelitis (HCC)   . Right leg weakness   . Urinary retention     Family History  family history includes AAA (abdominal aortic aneurysm) in his mother; Hypertension in his mother.  Prior Rehab/Hospitalizations: Had some chiropractic visits 7 months ago for his back.  Has the patient had major surgery during 100 days prior to admission? No  Current Medications   Current Facility-Administered Medications:  .  0.9 %  sodium chloride infusion, , Intravenous, Continuous, Ripudeep K Rai, MD, Last Rate: 100 mL/hr at 06/01/16 0601, 500 mL at 06/01/16 0601 .  acetaminophen (TYLENOL) tablet 650 mg, 650 mg, Oral, Q6H PRN, 650 mg at 05/30/16 0147 **OR** acetaminophen (TYLENOL) suppository 650 mg, 650 mg, Rectal, Q6H PRN, Lesle Chris Black, NP .  amLODipine (NORVASC) tablet 10 mg, 10 mg, Oral, Daily, Ripudeep K Rai, MD, 10 mg at 06/01/16 0932 .  hydrALAZINE (APRESOLINE) injection 5 mg, 5 mg, Intravenous, Q4H PRN, Gwenyth Bender, NP .  HYDROcodone-acetaminophen (NORCO/VICODIN) 5-325 MG per tablet 1-2 tablet, 1-2 tablet, Oral, Q4H PRN, Gwenyth Bender, NP, 2 tablet at 06/01/16 0140 .  hydrocortisone (ANUSOL-HC) suppository 25 mg, 25 mg, Rectal, Daily, Wilhemina Cash  Wilmon Pali, NP, 25 mg at 06/01/16 1610 .  lactulose (CHRONULAC) 10 GM/15ML solution 30 g, 30 g, Oral, BID, Ripudeep K Rai, MD, 30 g at 06/01/16 0932 .  ondansetron (ZOFRAN) tablet 4 mg, 4 mg, Oral, Q6H PRN **OR** ondansetron (ZOFRAN) injection 4 mg, 4 mg, Intravenous, Q6H PRN, Lesle Chris  Black, NP .  pantoprazole (PROTONIX) EC tablet 40 mg, 40 mg, Oral, Daily, Ruffin Frederick, MD, 40 mg at 06/01/16 0932 .  polyethylene glycol (MIRALAX / GLYCOLAX) packet 34 g, 34 g, Oral, BID, Ruffin Frederick, MD, 34 g at 06/01/16 0932 .  senna-docusate (Senokot-S) tablet 1 tablet, 1 tablet, Oral, BID, Ripudeep K Rai, MD, 1 tablet at 06/01/16 0932  Patients Current Diet: Diet Heart Room service appropriate? Yes; Fluid consistency: Thin  Precautions / Restrictions Precautions Precautions: Fall Other Brace/Splint: PRAFO R foot Restrictions Weight Bearing Restrictions: No   Has the patient had 2 or more falls or a fall with injury in the past year?No  Prior Activity Level Community (5-7x/wk): Micah Flesher out daily, was driving  Journalist, newspaper / Equipment Home Assistive Devices/Equipment: Environmental consultant (specify type) Home Equipment: None  Prior Device Use: Indicate devices/aids used by the patient prior to current illness, exacerbation or injury? None  Prior Functional Level Prior Function Level of Independence: Independent Comments: very active, lifts weights, plays pickle ball. Played football at [employer] Has a 69 yo son who lives with ex wife  Self Care: Did the patient need help bathing, dressing, using the toilet or eating?  Independent  Indoor Mobility: Did the patient need assistance with walking from room to room (with or without device)? Independent  Stairs: Did the patient need assistance with internal or external stairs (with or without device)? Independent  Functional Cognition: Did the patient need help planning regular tasks such as shopping or remembering to take medications? Independent  Current Functional Level Cognition  Overall Cognitive Status: Within Functional Limits for tasks assessed Orientation Level: Oriented X4 General Comments: scared about situation, appropriately    Extremity Assessment (includes Sensation/Coordination)  Upper Extremity  Assessment: Defer to OT evaluation (had been having cramping in hands before session)  Lower Extremity Assessment: LLE deficits/detail, RLE deficits/detail RLE Deficits / Details: no active motion at foot or ankle, knee ext 3/5 (buckles with WB'ing, lack of neuromuscular control noted), hip flex 1/5, hip adduction 2/5, hip abduction 2-/5 RLE Sensation:  [facility]) RLE Coordination: decreased gross motor LLE Deficits / Details: hip flex 2/5, knee ext 3+/5 (also buckling in standing), decreased neuromuscular control as well.  LLE Coordination: decreased gross motor    ADLs  Overall ADL's : Needs assistance/impaired Grooming: Set up Upper Body Bathing: Set up, Sitting Lower Body Bathing: Moderate assistance, Sitting/lateral leans Lower Body Bathing Details (indicate cue type and reason): Educated pt on using leg lifter to cross foot over knee to bath Upper Body Dressing : Set up, Sitting Lower Body Dressing: Maximal assistance, Sitting/lateral leans Lower Body Dressing Details (indicate cue type and reason): Educated pt on using leg lifter to cross foot over leg. Pt able to donn/doff B socks in this position Toilet Transfer: Minimal assistance, BSC (sit  - stnad with use of Stedy. BSC over toilet) Toileting- Clothing Manipulation and Hygiene: Moderate assistance Toileting - Clothing Manipulation Details (indicate cue type and reason): foley Functional mobility during ADLs: Minimal assistance (with Stedy) General ADL Comments: Began educating pt on AE for ADL as needed. Pt using leg lifter to help get BLE in proper positiong for bathing/dressing. Discussed  bathing @ bed leve at this time. Pt has more control when leaning to R side during pericare.     Mobility  Overal bed mobility: Needs Assistance Bed Mobility: Sit to Sidelying Supine to sit: Min guard Sit to sidelying: Mod assist General bed mobility comments: for LE's    Transfers  Overall transfer level: Needs assistance Equipment used:  Rolling walker (2 wheeled) Transfer via Lift Equipment: Stedy Transfers: Sit to/from Stand, [employer]ad[employer] Pet[employer] CorporationStand Pivot Transfers Sit to Stand: Mod assist Stand pivot transfers: Mod assist, Max assist General transfer comment: cues for technique, assist for lifting, assist for R LE placement, assist with walker to stand and step to bed with max cues for posture, R LE extension with assist for stance stability on R when stepping L    Ambulation / Gait / Stairs / Wheelchair Mobility  Ambulation/Gait Ambulation/Gait assistance: +2 physical assistance, Mod assist Ambulation Distance (Feet):  (2 steps forward/back, 3 steps forward/back) Assistive device: Rolling walker (2 wheeled) Gait Pattern/deviations: Step-to pattern General Gait Details: PT blocking Rt knee with weight bearing, physical assist needed to advance Rt LE. +2 assist on Lt side for stability instanding and safety.  Gait velocity: slow sequence    Posture / Balance Dynamic Sitting Balance Sitting balance - Comments: pt able to maintain balance in unsupported sitting but was a struggle with UE motion and wt shifting, lack of trunk control evident and obviously new and disconcerting for pt Balance Overall balance assessment: Needs assistance Sitting-balance support: No upper extremity supported, Feet supported Sitting balance-Leahy Scale: Good Sitting balance - Comments: pt able to maintain balance in unsupported sitting but was a struggle with UE motion and wt shifting, lack of trunk control evident and obviously new and disconcerting for pt Standing balance support: Bilateral upper extremity supported Standing balance-Leahy Scale: Poor Standing balance comment: able to stand with rw support    Special needs/care consideration BiPAP/CPAP No CPM No Continuous Drip IV 0.9% NS at 100 mL/hr Dialysis No     Life Vest No Oxygen No Special Bed No Trach Size No Wound Vac (area) No    Skin No                            Bowel mgmt: Last BM  05/31/16 Bladder mgmt: Foley catheter for urniary retention Diabetic mgmt No    Previous Home Environment Living Arrangements: Non-relatives/Friends Available Help at Discharge: Friend(s) Type of Home: House Home Layout: Two level, 1/2 bath on main level, Bed/bath upstairs Alternate Level Stairs-Number of Steps: flight Home Access: Stairs to enter Entrance Stairs-Rails: Left Entrance Stairs-Number of Steps: 5 Bathroom Shower/Tub: Tub/shower unit, Engineer, building servicesCurtain Bathroom Toilet: Standard Bathroom Accessibility: Yes How Accessible: Accessible via walker Home Care Services: No Additional Comments: pt currently living with a roommate. Going through a divorce. 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  Has a 199 yo son.) Contact Information: Monico BlitzLoraine [name] - grandmother - [phone] Anticipated Caregiver: self and roommate Ability/Limitations of Caregiver: Patient  is renting a room from his male roommate due to separation from his wife. Caregiver Availability: Intermittent Discharge Plan Discussed with Primary Caregiver: Yes Is Caregiver In Agreement with Plan?: Yes Does Caregiver/Family have Issues with Lodging/Transportation while Pt is in Rehab?: No  Goals/Additional Needs Patient/Family Goal for Rehab: PT/OT mod I and supervision goals Expected length of stay: 15-22 days Cultural Considerations: None Dietary Needs:  Heart diet, thin liquids Equipment Needs: TBD Additional Information: Left job with [employer] where he had medical insurance.  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 Pt/Family Agrees to Admission and willing to participate: Yes Program Orientation Provided & Reviewed with Pt/Caregiver Including Roles  & Responsibilities: Yes  Decrease burden of Care through IP rehab admission: N/A  Possible need for SNF placement upon discharge: Not planned  Patient Condition: This patient's medical and functional status has changed since the consult dated: 05/28/16 in which the Rehabilitation Physician determined and documented that the patient's condition is appropriate for intensive rehabilitative care in an inpatient rehabilitation facility. See "History of Present Illness" (above) for medical update. Functional changes are:  Currently requiring mod to max assist for stand pivot transfers, no ambulation yet. Patient's medical and functional status update has been discussed with the Rehabilitation physician and patient remains appropriate for inpatient rehabilitation. Will admit to inpatient rehab today.  Preadmission Screen Completed By:  Trish Mage, 06/01/2016 10:00 AM ______________________________________________________________________   Discussed status with Dr. Allena Katz on 06/01/16 at 1000 and received telephone approval for admission today.  Admission Coordinator:  Trish Mage, time1000/Date01/22/18

## 2016-05-29 NOTE — Progress Notes (Signed)
Physical Therapy Treatment Patient Details Name: Perry GraffJamie Morrison MRN: [id number] DOB: 07/08/1960 Today's Date: 05/29/2016    History of Present Illness 56 year old male who had noted some abdominal pain and constipation several days before and was seen in emergency room given some muscle relaxers but then again a half ago started to develop right lower extremity weakness. 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 There is some mass effect in the cord itself from this lesion. The rest of his cervical spine looks normal. The patient had an lumbar puncture pending.    PT Comments    Patient progressing with stand step transfers this session.  Noted some improvement in cervical symptoms with distraction and STM.  Continues to need R LE stabilization in stance due to weakness and continues to rely heavily on UE's.  Remains appropriate for CIR level rehab at d/c.   Follow Up Recommendations  CIR     Equipment Recommendations  Other (comment) (TBA)    Recommendations for Other Services       Precautions / Restrictions Precautions Precautions: Fall Required Braces or Orthoses: Other Brace/Splint Other Brace/Splint: PRAFO R foot    Mobility  Bed Mobility Overal bed mobility: Needs Assistance Bed Mobility: Sit to Sidelying         Sit to sidelying: Mod assist General bed mobility comments: for LE's  Transfers Overall transfer level: Needs assistance Equipment used: Rolling walker (2 wheeled) Transfers: Sit to/from [employer]GI CorporationStand;Stand Pivot Transfers Sit to Stand: Mod assist Stand pivot transfers: Mod assist;Max assist       General transfer comment: cues for technique, assist for lifting, assist for R LE placement, assist with walker to stand and step to bed with max cues for posture, R LE extension with  assist for stance stability on R when stepping L  Ambulation/Gait                 Stairs            Wheelchair Mobility    Modified Rankin (Stroke Patients Only)       Balance Overall balance assessment: Needs assistance   Sitting balance-Leahy Scale: Good     Standing balance support: Bilateral upper extremity supported Standing balance-Leahy Scale: Poor Standing balance comment: able to stand with rw support                    Cognition Arousal/Alertness: Awake/alert Behavior During Therapy: WFL for tasks assessed/performed Overall Cognitive Status: Within Functional Limits for tasks assessed                      Exercises Other Exercises Other Exercises: DF stretch x 20 R ankle, bilateral stretching with single knee to chest and IR and ER hip stretch Other Exercises: seated trunk flexion and rotation stretch with assist Other Exercises: cervical stretching, STM and distraction for pain control    General Comments        Pertinent Vitals/Pain Faces Pain Scale: Hurts even more Pain Location: neck Pain Descriptors / Indicators: Aching;Cramping Pain Intervention(s): Monitored during session;Repositioned;Other (comment) (stretching)    Home Living                      Prior Function  PT Goals (current goals can now be found in the care plan section) Progress towards PT goals: Progressing toward goals    Frequency    Min 4X/week      PT Plan Current plan remains appropriate    Co-evaluation             End of Session Equipment Utilized During Treatment: Gait belt Activity Tolerance: Patient tolerated treatment well Patient left: in bed;with call bell/phone within reach;with bed alarm set     Time: 1610-9604 PT Time Calculation (min) (ACUTE ONLY): 24 min  Charges:  $Therapeutic Exercise: 8-22 mins $Therapeutic Activity: 8-22 mins                    G Codes:      Elray Mcgregor Jun 10, 2016,  12:39 PM  Sheran Lawless, PT [phone] 10-Jun-2016

## 2016-05-30 ENCOUNTER — Inpatient Hospital Stay (HOSPITAL_COMMUNITY): Payer: Medicaid Other

## 2016-05-30 DIAGNOSIS — K922 Gastrointestinal hemorrhage, unspecified: Secondary | ICD-10-CM | POA: Diagnosis present

## 2016-05-30 DIAGNOSIS — K5900 Constipation, unspecified: Secondary | ICD-10-CM

## 2016-05-30 DIAGNOSIS — K921 Melena: Secondary | ICD-10-CM

## 2016-05-30 LAB — CBC
HEMATOCRIT: 38.4 % — AB (ref 39.0–52.0)
Hemoglobin: 12.8 g/dL — ABNORMAL LOW (ref 13.0–17.0)
MCH: 28.8 pg (ref 26.0–34.0)
MCHC: 33.3 g/dL (ref 30.0–36.0)
MCV: 86.3 fL (ref 78.0–100.0)
PLATELETS: 201 10*3/uL (ref 150–400)
RBC: 4.45 MIL/uL (ref 4.22–5.81)
RDW: 12.8 % (ref 11.5–15.5)
WBC: 8.5 10*3/uL (ref 4.0–10.5)

## 2016-05-30 LAB — BASIC METABOLIC PANEL
Anion gap: 10 (ref 5–15)
BUN: 23 mg/dL — AB (ref 6–20)
CHLORIDE: 103 mmol/L (ref 101–111)
CO2: 24 mmol/L (ref 22–32)
CREATININE: 1.15 mg/dL (ref 0.61–1.24)
Calcium: 9.1 mg/dL (ref 8.9–10.3)
GFR calc Af Amer: >60 mL/min (ref >60)
GFR calc non Af Amer: >60 mL/min (ref >60)
GLUCOSE: 160 mg/dL — AB (ref 65–99)
POTASSIUM: 4.2 mmol/L (ref 3.5–5.1)
Sodium: 137 mmol/L (ref 135–145)

## 2016-05-30 LAB — OCCULT BLOOD X 1 CARD TO LAB, STOOL: Fecal Occult Bld: POSITIVE — AB

## 2016-05-30 MED ORDER — POLYETHYLENE GLYCOL 3350 17 G PO PACK: 17.0000 g | PACK | Freq: Three times a day (TID) | ORAL | Status: DC

## 2016-05-30 MED ORDER — MAGNESIUM CITRATE PO SOLN
1.0000 | Freq: Once | ORAL | Status: AC
  Administered 2016-05-30: 1 via ORAL
  Filled 2016-05-30: qty 296

## 2016-05-30 MED ORDER — AMLODIPINE BESYLATE 5 MG PO TABS
5.0000 mg | ORAL_TABLET | Freq: Once | ORAL | Status: AC
  Administered 2016-05-30: 5 mg via ORAL
  Filled 2016-05-30: qty 1

## 2016-05-30 MED ORDER — SODIUM CHLORIDE 0.9 % IV SOLN
INTRAVENOUS | Status: DC
  Administered 2016-05-30 – 2016-05-31 (×3): via INTRAVENOUS
  Administered 2016-06-01: 500 mL via INTRAVENOUS

## 2016-05-30 MED ORDER — POLYETHYLENE GLYCOL 3350 17 G PO PACK
34.0000 g | PACK | Freq: Two times a day (BID) | ORAL | Status: DC
  Administered 2016-05-30 – 2016-06-01 (×3): 34 g via ORAL
  Filled 2016-05-30 (×4): qty 2

## 2016-05-30 MED ORDER — HYDROCORTISONE ACETATE 25 MG RE SUPP
25.0000 mg | Freq: Every day | RECTAL | Status: DC
  Administered 2016-05-30 – 2016-06-01 (×2): 25 mg via RECTAL
  Filled 2016-05-30 (×3): qty 1

## 2016-05-30 MED ORDER — PANTOPRAZOLE SODIUM 40 MG PO TBEC
40.0000 mg | DELAYED_RELEASE_TABLET | Freq: Every day | ORAL | Status: DC
  Administered 2016-05-31 – 2016-06-01 (×2): 40 mg via ORAL
  Filled 2016-05-30 (×2): qty 1

## 2016-05-30 MED ORDER — PANTOPRAZOLE SODIUM 40 MG IV SOLR
40.0000 mg | Freq: Two times a day (BID) | INTRAVENOUS | Status: DC
  Administered 2016-05-30: 40 mg via INTRAVENOUS
  Filled 2016-05-30: qty 40

## 2016-05-30 MED ORDER — AMLODIPINE BESYLATE 10 MG PO TABS
10.0000 mg | ORAL_TABLET | Freq: Every day | ORAL | Status: DC
  Administered 2016-05-31 – 2016-06-01 (×2): 10 mg via ORAL
  Filled 2016-05-30 (×2): qty 1

## 2016-05-30 NOTE — Progress Notes (Signed)
Pt having moderate amount of bright red blood on washcloth when he was bathing himself this am. Pt did state he strained to have a bm yesterday but was not even attempting to have one at this time. Pt also stated that he had no history of hemorrhoids. MD notified. Will get occult blood and she will notify gi. Will continue to monitor

## 2016-05-30 NOTE — Progress Notes (Signed)
Subjective: Interval History: Can't move right leg.  Numbness and tingling in the left leg.  Foley in place.  Objective: Vital signs in last 24 hours: Temp:  [97.3 F (36.3 C)-98.9 F (37.2 C)] 98.7 F (37.1 C) (01/20 0531) Pulse Rate:  [58-88] 58 (01/20 0531) Resp:  [17-20] 20 (01/20 0531) BP: (152-163)/(71-86) 155/80 (01/20 0553) SpO2:  [98 %-100 %] 99 % (01/20 0531)  Intake/Output from previous day: 01/19 0701 - 01/20 0700 In: 1790 [P.O.:840; I.V.:900; IV Piggyback:50] Out: 5400 [Urine:5400] Intake/Output this shift: No intake/output data recorded. Nutritional status: Diet Heart Room service appropriate? Yes; Fluid consistency: Thin  Neurologic Exam: Awake, alert, oriented. Strength 5/5 Bil UE and LLE.  0/5 RLE. Babinski Right side.  No Hoffman's. Reduced pinprick sensation in the left LE from toes to the T10 area on the left.  Right leg and trunk have normal sensation.  Lab Results:  Recent Labs  05/29/16 1036 05/30/16 0314  WBC 11.2* 8.5  HGB 13.5 12.8*  HCT 39.6 38.4*  PLT 237 201  NA 139 137  K 4.0 4.2  CL 105 103  CO2 24 24  GLUCOSE 141* 160*  BUN 27* 23*  CREATININE 1.25* 1.15  CALCIUM 9.4 9.1   Lipid Panel No results for input(s): CHOL, TRIG, HDL, CHOLHDL, VLDL, LDLCALC in the last 72 hours.  Studies/Results: No results found.  Medications:  Scheduled: . amLODipine  5 mg Oral Daily  . methylPREDNISolone (SOLU-MEDROL) injection  1,000 mg Intravenous Q24H  . polyethylene glycol  17 g Oral BID  . 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  Although this was not seen on MRA spine, small ones may be missed and conventional angiograms are more sensitive.  For now, we will continue to do the steroids to reduced the swelling.  The hemorrhage should slowly resorb with time.  He will require extensive PT and OT  over the next few months, but I am hopeful that he will eventually recover most if not all of his neurological function.    LOS: 4 days   Weston SettleESHRAGHI, Robbie Rideaux, MD 05/30/2016  10:01 AM

## 2016-05-30 NOTE — Consult Note (Signed)
Referring Provider: [facility]   Primary Care Physician:  No PCP Per Patient Primary Gastroenterologist: Gentry Fitz. Had a screening colonoscopy in [place] 4 years ago  Reason for Consultation: rectal bleeding  ASSESSMENT AND PLAN:   1. Myelitis with edema / hemorrhage - 56 yo male admitted several days ago with RLE weakness / numbness. Neurology and Neurosurgery following. 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  On IV steroids.   2. Painless rectal bleeding, one episode this am. Bleeding in setting of recent constipation / straining. Patient constipated for a few days prior to admission. Also had urinary retention. Assume bowel and bladder problems related to  #1. Getting BID miralax and Senna but still not having much in way of bowel movements. He is slightly distended on exam, complains of bloating. Has normal bowel sounds with occasional abnormal tinkling.  -KUB today. If distention from constipation then will give purge bowels and give more aggressive bowel regimen. First want to make sure he is not developing ileus.    -Steroid supp QD empirically (internal hemorrhoids?).  -monitor for recurrent rectal bleeding.   3. 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 AKI, resolving   HPI: Perry Morrison is a 56 y.o. male who came to ED on 1/14 with epigastric pain, RUQ pain and constipation. Labs were reassuring. Large stool burden on xray.He was discharged home. Took castor oil and Epsom salts and did have a BM. Two days later he developed urinary hesitancy and RLE weakness and returned to ED where MRI was concerning for myelitis.  Patient had no bowel problems prior to recent events. Per patient, a screening colonoscopy at age 28 was normal [facility]). He feels bloated, uncomfortable today. No nausea. He had one Vicodin today but otherwise no narcotics    Past Medical History:  Diagnosis Date  . AKI (acute  kidney injury) (HCC)   . 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 Hypertension   . Myelitis (HCC)     History reviewed. No pertinent surgical history.  Prior to Admission medications   Medication Sig Start Date End Date Taking? Authorizing Provider  MAGNESIUM PO Take 1 tablet by mouth daily.   Yes Historical Provider, MD  Omega-3 Fatty Acids (OMEGA-3 PO) Take 1 capsule by mouth daily.   Yes Historical Provider, MD  lidocaine (LMX) 4 % cream Apply 1 application topically 3 (three) times daily as needed. Patient not taking: Reported on 05/26/2016 05/24/16   Joni Reining Pisciotta, PA-C  methocarbamol (ROBAXIN) 500 MG tablet Can take up to 1-2 tabs every 6 hours PRN PAIN Patient not taking: Reported on 05/26/2016 05/24/16   Joni Reining Pisciotta, PA-C    Current Facility-Administered Medications  Medication Dose Route Frequency Provider Last Rate Last Dose  . 0.9 %  sodium chloride infusion   Intravenous Continuous Ripudeep K Rai, MD 100 mL/hr at 05/30/16 1202    . acetaminophen (TYLENOL) tablet 650 mg  650 mg Oral Q6H PRN Gwenyth Bender, NP   650 mg at 05/30/16 0147   Or  . acetaminophen (TYLENOL) suppository 650 mg  650 mg Rectal Q6H PRN Gwenyth Bender, NP      . Melene Muller ON 05/31/2016] amLODipine (NORVASC) tablet 10 mg  10 mg Oral Daily Ripudeep K Rai, MD      . hydrALAZINE (APRESOLINE) injection 5 mg  5 mg Intravenous Q4H PRN Gwenyth Bender, NP      .  HYDROcodone-acetaminophen (NORCO/VICODIN) 5-325 MG per tablet 1-2 tablet  1-2 tablet Oral Q4H PRN Gwenyth Bender, NP   1 tablet at 05/30/16 0445  . methylPREDNISolone sodium succinate (SOLU-MEDROL) 1,000 mg in sodium chloride 0.9 % 50 mL IVPB  1,000 mg Intravenous Q24H Bearl Mulberry, MD   1,000 mg at 05/30/16 1342  . ondansetron (ZOFRAN) tablet 4 mg  4 mg Oral Q6H PRN Gwenyth Bender, NP       Or  . ondansetron [facility]) injection 4 mg  4 mg Intravenous Q6H PRN Lesle Chris Black, NP      . pantoprazole (PROTONIX) injection 40 mg  40 mg  Intravenous Q12H Ripudeep Jenna Luo, MD   40 mg at 05/30/16 1223  . polyethylene glycol (MIRALAX / GLYCOLAX) packet 17 g  17 g Oral BID Ripudeep Jenna Luo, MD   17 g at 05/30/16 1202  . senna-docusate (Senokot-S) tablet 1 tablet  1 tablet Oral BID Ripudeep Jenna Luo, MD   1 tablet at 05/30/16 1202    Allergies as of 05/26/2016  . (No Known Allergies)    Family History  Problem Relation Age of Onset  . Hypertension Mother   . AAA (abdominal aortic aneurysm) Mother     Social History   Social History  . Marital status: Married    Spouse name: N/A  . Number of children: N/A  . Years of education: N/A   Occupational History  . Not on file.   Social History Main Topics  . Smoking status: Never Smoker  . Smokeless tobacco: Never Used  . Alcohol use No  . Drug use: No  . Sexual activity: Not on file   Other Topics Concern  . Not on file   Social History Narrative  . No narrative on file    Review of Systems: RLE weakness. All other systems reviewed and negative except where noted in HPI.  Physical Exam: Vital signs in last 24 hours: Temp:  [97.6 F (36.4 C)-98.9 F (37.2 C)] 97.6 F (36.4 C) (01/20 1128) Pulse Rate:  [58-74] 58 (01/20 1128) Resp:  [18-20] 18 (01/20 1128) BP: (152-164)/(71-86) 164/72 (01/20 1128) SpO2:  [98 %-100 %] 100 % (01/20 1128) Last BM Date: 05/29/16 General:   Alert, well-developed, well-nourished, pleasant black male in NAD Head:  Normocephalic and atraumatic. Eyes:  Sclera clear, no icterus.   Conjunctiva pink. Ears:  Normal auditory acuity. Nose:  No deformity, discharge,  or lesions. Neck:  Supple; no masses  Lungs:  Clear throughout to auscultation.   No wheezes, crackles, or rhonchi.  Heart:  Regular rate and rhythm; no murmurs, clicks, rubs,  or gallops. Abdomen:  Soft,nontender, BS active,nonpalp mass or hsm.   Rectal:  Trace light brown mushy stool in vault. No fissures appreciated. No obvious masses on DRE  Msk:  Symmetrical without gross  deformities. . Pulses:  Normal pulses noted. Extremities:  Without clubbing or edema. Neurologic:  Alert and  oriented x4;  grossly normal neurologically. Skin:  Intact without significant lesions or rashes.. Psych:  Alert and cooperative. Normal mood and affect.  Intake/Output from previous day: 01/19 0701 - 01/20 0700 In: 1790 [P.O.:840; I.V.:900; IV Piggyback:50] Out: 5400 [Urine:5400] Intake/Output this shift: Total I/O In: -  Out: 1000 [Urine:1000]  Lab Results:  Recent Labs  05/28/16 0524 05/29/16 1036 05/30/16 0314  WBC 8.6 11.2* 8.5  HGB 14.7 13.5 12.8*  HCT 42.7 39.6 38.4*  PLT 262 237 201   BMET  Recent Labs  05/28/16 0524  05/29/16 1036 05/30/16 0314  NA 135 139 137  K 4.7 4.0 4.2  CL 102 105 103  CO2 24 24 24   GLUCOSE 156* 141* 160*  BUN 22* 27* 23*  CREATININE 1.29* 1.25* 1.15  CALCIUM 9.8 9.4 9.1   Studies/Results: No results found.   Willette ClusterPaula Guenther, NP  05/30/2016, 2:02 PM  Pager number [phone]

## 2016-05-30 NOTE — Progress Notes (Signed)
[facility]                                                                              Patient Demographics  Perry Morrison, is a 56 y.o. male, DOB - [birth year], WJX:[id number]  Admit date - 05/26/2016   Admitting Physician Ozella Rocks, MD  Outpatient Primary MD for the patient is No PCP Per Patient  Outpatient specialists:   LOS - 4  days    Chief Complaint  Patient presents with  . Leg Pain  . Abdominal Pain  . Urinary Frequency       Brief summary   Perry Morrison is a 56 y.o. male with medical history significant for hypertension this emergency Department chief complaint of persistent abdominal pain and sudden onset right foot leg weakness/numbness tingling.  MRI of the T-spine was concerning for abnormal signal within the thoracic cord extending from lower T3 through the mid T10 level suggestive of edema of indeterminate etiology.  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 - Neurosurgery was consulted and seen by Dr. Danielle Dess, did not think neurosurgical issue, recommended IV steroids, PTOT - MRI of the brain showed no demyelinating disease, vasculitis or infectious/inflammatory process.  MRA showed no spinal AVM or fistula. - CSF culture negative.  - Per neurology recommendations, on high-dose IV Solu-Medrol day # 4/5, will need oral steroid taper after completion - Continue PTOT-> CIR  Active problems Lower GI bleed - Patient had moderate amount of bright red blood on washcloth while he was bathing, did strain to have a BM yesterday - Denies any history of hemorrhoids, placed on clear liquid diet, continue IV fluids, placed on PPI BID, GI consulted, d/w  Dr Adela Lank   Urinary retention. Patient recent onset of frequency and difficulty initiating urine flow. More likely neurogenic related to above vs obstruction. Prostate exam within the limits of normal. Foley inserted in the emergency department and immediately drained 1 L of clear urine -Continue Foley for 1 week then voiding trials  Hypertension.  - Currently stable Not on any antihypertensive medications PTA, started on Norvasc, will increase to 10 mg daily. -Continue PRN hydralazine   Acute kidney injury. Creatinine 1.3. May be related to decreased oral intake in the setting of urinary retention - Hold nephrotoxins - Continue IV fluid hydration, renal ultrasound negative for any obstruction or hydronephrosis - Creatinine function improving   Constipation.  -Placed on bowel regimen   Code Status: Full CODE STATUS DVT Prophylaxis:   SCD's Family Communication: Discussed in detail with the patient, all imaging results, lab results explained to the patient    Disposition Plan: CIR   Time Spent in minutes   25 minutes  Procedures:    Consultants:   Neurosurgery Neurology GI  Antimicrobials:      Medications  Scheduled Meds: . amLODipine  5 mg Oral Daily  . methylPREDNISolone (  SOLU-MEDROL) injection  1,000 mg Intravenous Q24H  . pantoprazole (PROTONIX) IV  40 mg Intravenous Q12H  . polyethylene glycol  17 g Oral BID  . senna-docusate  1 tablet Oral BID   Continuous Infusions: . sodium chloride 100 mL/hr at 05/30/16 1202   PRN Meds:.acetaminophen **OR** acetaminophen, hydrALAZINE, HYDROcodone-acetaminophen, ondansetron **OR** ondansetron (ZOFRAN) IV   Antibiotics   Anti-infectives    None        Subjective:   Onyx Schirmer was seen and examined today. No significant improvement of right lower extremity weakness yet. BP has been trending up. This morning having bright red blood per rectum. No abdominal pain nausea vomiting. Patient denies  dizziness, chest pain, shortness of breath. No acute events overnight.    Objective:   Vitals:   05/30/16 0139 05/30/16 0531 05/30/16 0553 05/30/16 1128  BP: (!) 162/86 (!) 152/84 (!) 155/80 (!) 164/72  Pulse: 67 (!) 58  (!) 58  Resp: 20 20  18   Temp: 98.6 F (37 C) 98.7 F (37.1 C)  97.6 F (36.4 C)  TempSrc: Oral Oral  Oral  SpO2: 99% 99%  100%  Weight:      Height:        Intake/Output Summary (Last 24 hours) at 05/30/16 1204 Last data filed at 05/30/16 1128  Gross per 24 hour  Intake             1550 ml  Output             6400 ml  Net            -4850 ml     Wt Readings from Last 3 Encounters:  05/26/16 102.1 kg (225 lb)     Exam  General: Alert and oriented x 3, NAD  HEENT:    Neck:   Cardiovascular: S1 S2 clear, RRR  Respiratory: CTAB  Gastrointestinal: Soft, NT, NBS, ND  Ext: no cyanosis clubbing or edema  Neuro:  not able to lift his right leg  Skin: No rashes  Psych: Normal affect and demeanor, alert and oriented x3    Data Reviewed:  I have personally reviewed following labs and imaging studies  Micro Results Recent Results (from the past 240 hour(s))  CSF culture     Status: None   Collection Time: 05/26/16  4:00 PM  Result Value Ref Range Status   Specimen Description CSF  Final   Special Requests NONE  Final   Gram Stain   Final    CYTOSPIN SMEAR WBC PRESENT,BOTH PMN AND MONONUCLEAR RBC PRESENT NO ORGANISMS SEEN    Culture NO GROWTH 3 DAYS  Final   Report Status 05/29/2016 FINAL  Final    Radiology Reports Ct Head Wo Contrast  Result Date: 05/26/2016 CLINICAL DATA:  Right leg numbness with decreased movement of right foot. EXAM: CT HEAD WITHOUT CONTRAST TECHNIQUE: Contiguous axial images were obtained from the base of the skull through the vertex without intravenous contrast. COMPARISON:  None. FINDINGS: Brain: No evidence of acute infarction, hemorrhage, hydrocephalus, extra-axial collection or mass lesion/mass effect.  Vascular: No hyperdense vessel or unexpected calcification. Skull: Normal. Negative for fracture or focal lesion. Sinuses/Orbits: No acute finding. Other: None. IMPRESSION: Normal head CT. Electronically Signed   By: Irish Lack M.D.   On: 05/26/2016 15:21   Mr Laqueta Jean ZO Contrast  Result Date: 05/27/2016 CLINICAL DATA:  Evaluate for intracranial white matter abnormalities in a patient with paraplegia. EXAM: MRI HEAD WITHOUT AND WITH CONTRAST TECHNIQUE: Multiplanar, multiecho  pulse sequences of the brain and surrounding structures were obtained without and with intravenous contrast. CONTRAST:  20mL MULTIHANCE GADOBENATE DIMEGLUMINE 529 MG/ML IV SOLN COMPARISON:  MRI thoracic spine 05/26/2016. FINDINGS: Brain: No evidence for acute infarction, hemorrhage, mass lesion, hydrocephalus, or extra-axial fluid. Normal for age cerebral volume. No significant white matter signal abnormality to suggest demyelinating disease, vasculitis, or inflammatory/infectious process. Post infusion, no abnormal enhancement of the brain or meninges. Vascular: Flow voids are maintained throughout the carotid, basilar, and vertebral arteries. There are no areas of chronic hemorrhage. Skull and upper cervical spine: Unremarkable visualized calvarium, skullbase, and cervical vertebrae. Pituitary, pineal, cerebellar tonsils unremarkable. No upper cervical cord lesions. Sinuses/Orbits: No orbital masses or proptosis. Globes appear symmetric. Sinuses appear well aerated, without evidence for air-fluid level. Within limits for assessment on routine brain MR, normal-appearing optic nerves, optic chiasm, and tracts. Other: No nasopharyngeal pathology or mastoid fluid. Scalp and other visualized extracranial soft tissues grossly unremarkable. IMPRESSION: No significant white matter signal abnormalities to suggest demyelinating disease, vasculitis, or infectious/inflammatory process. No abnormal postcontrast enhancement to suggest a  granulomatous process such as neurosarcoid. Within limits for assessment on routine brain MR, negative orbits; no evidence for demyelinating process of the anterior optic pathways. Electronically Signed   By: Elsie StainJohn T Curnes M.D.   On: 05/27/2016 17:17   Mr Thoracic Spine W Wo Contrast  Result Date: 05/26/2016 EXAM: MRI THORACIC AND LUMBAR SPINE WITHOUT AND WITH CONTRAST TECHNIQUE: Multiplanar and multiecho pulse sequences of the thoracic and lumbar spine were obtained without and with intravenous contrast. CONTRAST:  20mL MULTIHANCE GADOBENATE DIMEGLUMINE 529 MG/ML IV SOLN COMPARISON:  None. FINDINGS: MRI THORACIC SPINE FINDINGS Alignment:  Minimal curvature. Vertebrae: Mild edema within the right C7 facet possibly degenerative in origin. Cord: Abnormal signal within the cord extends from the lower T3 through the mid T10 level. Additionally, blood breakdown products are noted within the right aspect of the cord extending from the T5-6 level to the lower T9 level (maximal at the T7-8 level). Mild enhancement of the cord T6-7 through upper T8 level. Paraspinal and other soft tissues: Mild dilation thoracic aorta with ascending thoracic aorta measuring up to 4.2 cm. Disc levels: C6-7: Moderate bulge/ osteophyte with mild spinal stenosis and minimal cord flattening. Axial images not obtained. Scattered minimal bulges throughout the thoracic region without large thoracic disc herniation causing cord compression. MRI LUMBAR SPINE FINDINGS Segmentation:  Last fully open disc space labeled L5-S1. Alignment:  Mild straightening and slight curvature. Vertebrae:  Endplate degenerative changes L4-5. Conus medullaris: Extends to the T12-L1 level. Paraspinal and other soft tissues: Tiny right renal cyst. Disc levels: L1-2:  Negative. L2-3: Mild facet degenerative changes. Minimal bulge greater left lateral position touching but not compressing exiting left L2 nerve root. L3-4: Bulge. Facet degenerative changes. Mild bilateral  foraminal narrowing. Multifactorial mild spinal stenosis and lateral recess narrowing bilaterally. L4-5: Facet degenerative changes and ligamentum flavum hypertrophy greater on the right. Disc degeneration with disc space narrowing endplate reactive changes greater on the right. Bulge and osteophyte greater right foraminal/ lateral position encroaching upon exiting right L4 nerve root. Moderate narrowing right lateral thecal sac and right lateral recess. Mild spinal stenosis greater on right. L5-S1: Moderate facet degenerative changes. Bulge with osteophyte. Mild lateral recess narrowing. Mild foraminal narrowing greater on the left. IMPRESSION: MRI THORACIC SPINE Abnormal signal within the thoracic cord extends from the lower T3 through the mid T10 level suggestive of edema. Additionally, blood breakdown products are noted within the right aspect  of the cord extending from the T5-6 level to the lower T9 level (maximal at the T7-8 level). Mild enhancement of the cord T6-7 through upper T8 level. Etiology of thoracic cord abnormality is indeterminate. Considerations include: Hemorrhagic transverse myelitis secondary to viral trigger (infection or vaccination). Patient reports possible right-sided shingles in the past and therefore this may be related to such. Result of myelitis secondary to recent infection (such as influenza) with hemorrhagic conversion or ADEM with hemorrhagic conversion are considerations. Thoracic cord ischemia with hemorrhagic conversion. Given the fact the patient has a slightly dilated ascending thoracic aorta, CT angiogram of the chest abdomen and pelvis may be considered to exclude dissection not detected on the current exam. Vascular malformation such as a cavernoma with bleeding and surrounding edema. Although there is blush like enhancement, appearance is not typical for dural fistula as vessels are not seen along the periphery of the cord. Additionally, symptoms occurred more acutely  than expected with dural fistula. Post inflammatory myelitis/vasculitis with bleeding (such as Behcet's disease, sarcoidosis, Wegener's, etc.). Tumor felt last likely consideration (ependymomas can bleed but usually enhance more). Metastatic disease felt less likely unless the patient has a known malignancy with hemorrhagic propensity such as melanoma. Neuromyelitis optica. Multiple sclerosis with hemorrhagic conversion felt less likely consideration. C6-7 moderate bulge/ osteophyte with mild spinal stenosis and minimal cord flattening. Axial images not obtained. Scattered minimal bulges throughout the thoracic region without large thoracic disc herniation causing cord compression. Mild edema within the right C7 facet possibly degenerative in origin. MRI LUMBAR SPINE Multi-level degenerative changes most prominent L4-5 level as detailed above. These results were called by telephone at the time of interpretation on 05/26/2016 at 12:44 pm to Dr. Karma Ganja, who verbally acknowledged these results. Electronically Signed   By: Lacy Duverney M.D.   On: 05/26/2016 13:15   Mr Lumbar Spine W Wo Contrast  Result Date: 05/26/2016 EXAM: MRI THORACIC AND LUMBAR SPINE WITHOUT AND WITH CONTRAST TECHNIQUE: Multiplanar and multiecho pulse sequences of the thoracic and lumbar spine were obtained without and with intravenous contrast. CONTRAST:  20mL MULTIHANCE GADOBENATE DIMEGLUMINE 529 MG/ML IV SOLN COMPARISON:  None. FINDINGS: MRI THORACIC SPINE FINDINGS Alignment:  Minimal curvature. Vertebrae: Mild edema within the right C7 facet possibly degenerative in origin. Cord: Abnormal signal within the cord extends from the lower T3 through the mid T10 level. Additionally, blood breakdown products are noted within the right aspect of the cord extending from the T5-6 level to the lower T9 level (maximal at the T7-8 level). Mild enhancement of the cord T6-7 through upper T8 level. Paraspinal and other soft tissues: Mild dilation thoracic  aorta with ascending thoracic aorta measuring up to 4.2 cm. Disc levels: C6-7: Moderate bulge/ osteophyte with mild spinal stenosis and minimal cord flattening. Axial images not obtained. Scattered minimal bulges throughout the thoracic region without large thoracic disc herniation causing cord compression. MRI LUMBAR SPINE FINDINGS Segmentation:  Last fully open disc space labeled L5-S1. Alignment:  Mild straightening and slight curvature. Vertebrae:  Endplate degenerative changes L4-5. Conus medullaris: Extends to the T12-L1 level. Paraspinal and other soft tissues: Tiny right renal cyst. Disc levels: L1-2:  Negative. L2-3: Mild facet degenerative changes. Minimal bulge greater left lateral position touching but not compressing exiting left L2 nerve root. L3-4: Bulge. Facet degenerative changes. Mild bilateral foraminal narrowing. Multifactorial mild spinal stenosis and lateral recess narrowing bilaterally. L4-5: Facet degenerative changes and ligamentum flavum hypertrophy greater on the right. Disc degeneration with disc space narrowing endplate reactive changes  greater on the right. Bulge and osteophyte greater right foraminal/ lateral position encroaching upon exiting right L4 nerve root. Moderate narrowing right lateral thecal sac and right lateral recess. Mild spinal stenosis greater on right. L5-S1: Moderate facet degenerative changes. Bulge with osteophyte. Mild lateral recess narrowing. Mild foraminal narrowing greater on the left. IMPRESSION: MRI THORACIC SPINE Abnormal signal within the thoracic cord extends from the lower T3 through the mid T10 level suggestive of edema. Additionally, blood breakdown products are noted within the right aspect of the cord extending from the T5-6 level to the lower T9 level (maximal at the T7-8 level). Mild enhancement of the cord T6-7 through upper T8 level. Etiology of thoracic cord abnormality is indeterminate. Considerations include: Hemorrhagic transverse myelitis  secondary to viral trigger (infection or vaccination). Patient reports possible right-sided shingles in the past and therefore this may be related to such. Result of myelitis secondary to recent infection (such as influenza) with hemorrhagic conversion or ADEM with hemorrhagic conversion are considerations. Thoracic cord ischemia with hemorrhagic conversion. Given the fact the patient has a slightly dilated ascending thoracic aorta, CT angiogram of the chest abdomen and pelvis may be considered to exclude dissection not detected on the current exam. Vascular malformation such as a cavernoma with bleeding and surrounding edema. Although there is blush like enhancement, appearance is not typical for dural fistula as vessels are not seen along the periphery of the cord. Additionally, symptoms occurred more acutely than expected with dural fistula. Post inflammatory myelitis/vasculitis with bleeding (such as Behcet's disease, sarcoidosis, Wegener's, etc.). Tumor felt last likely consideration (ependymomas can bleed but usually enhance more). Metastatic disease felt less likely unless the patient has a known malignancy with hemorrhagic propensity such as melanoma. Neuromyelitis optica. Multiple sclerosis with hemorrhagic conversion felt less likely consideration. C6-7 moderate bulge/ osteophyte with mild spinal stenosis and minimal cord flattening. Axial images not obtained. Scattered minimal bulges throughout the thoracic region without large thoracic disc herniation causing cord compression. Mild edema within the right C7 facet possibly degenerative in origin. MRI LUMBAR SPINE Multi-level degenerative changes most prominent L4-5 level as detailed above. These results were called by telephone at the time of interpretation on 05/26/2016 at 12:44 pm to Dr. Karma Ganja, who verbally acknowledged these results. Electronically Signed   By: Lacy Duverney M.D.   On: 05/26/2016 13:15   US Renal  Result Date: 05/27/2016 CLINICAL  DATA:  Acute renal failure EXAM: RENAL / URINARY TRACT ULTRASOUND COMPLETE COMPARISON:  None. FINDINGS: Right Kidney: Length: 10.2 cm. Echogenicity within normal limits. No mass or hydronephrosis visualized. Left Kidney: Length: 11.2 cm. Echogenicity within normal limits. No mass or hydronephrosis visualized. Bladder: Decompressed by Foley catheter. IMPRESSION: No evidence for hydronephrosis. Electronically Signed   By: Kennith Center M.D.   On: 05/27/2016 17:51   Mr [facility] W Wo Contrast  Result Date: 05/27/2016 CLINICAL DATA:  Thoracic Spinal cord hemorrhagic lesion with edema. Paraplegia. EXAM: MRA SPINE WITHOUT AND WITH CONTRAST TECHNIQUE: Multiplanar and multiecho pulse sequences of the spine were obtained without and with intravenous contrast. Angiographic images of the spine were obtained using MRA technique without and with intravenous contrast. CONTRAST:  See below 20mL MULTIHANCE GADOBENATE DIMEGLUMINE 529 MG/ML IV SOLN COMPARISON:  MRI brain reported separately FINDINGS: There is good opacification of the spinal arteries and veins. No spinal AVM or fistula is identified. The cord remains enlarged, T2 hyperintense, with heterogeneity similar to 05/26/2016. See differential considerations as outlined on that study. IMPRESSION: No spinal AVM or  fistula is identified. Electronically Signed   By: Elsie Stain M.D.   On: 05/27/2016 16:56   Dg Abdomen Acute W/chest  Result Date: 05/24/2016 CLINICAL DATA:  Abdominal pain EXAM: DG ABDOMEN ACUTE W/ 1V CHEST COMPARISON:  None. FINDINGS: The lungs are clear.  Cardiomediastinal contours are normal. No free intraperitoneal air. There is a large amount of stool within the colon. No dilated small bowel is identified. IMPRESSION: 1. Clear lungs. 2. No free intraperitoneal air. 3. Large amount of stool within the colon. No radiographic evidence of small-bowel obstruction. Electronically Signed   By: Deatra Robinson M.D.   On: 05/24/2016 01:57   Ct Angio  Chest/abd/pel For Dissection W And/or Wo Contrast  Result Date: 05/26/2016 CLINICAL DATA:  Low back pain and abdominal pain. Evidence for thoracic cord ischemia with hemorrhagic conversion on recent MRI. CT angiogram was recommended to exclude an aortic dissection. EXAM: CT ANGIOGRAPHY CHEST, ABDOMEN AND PELVIS TECHNIQUE: Multidetector CT imaging through the chest, abdomen and pelvis was performed using the standard protocol during bolus administration of intravenous contrast. Multiplanar reconstructed images and MIPs were obtained and reviewed to evaluate the vascular anatomy. CONTRAST:  100 mL Isovue 370 COMPARISON:  Thoracic spine MRI 05/26/2016 FINDINGS: CTA CHEST FINDINGS Cardiovascular: The aortic root at the sinuses of Valsalva measures 4.5 cm. The mid ascending thoracic aorta is aneurysmal measuring up to 4.2 cm. 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 The great vessels are patent. Negative for an aortic dissection. Proximal descending thoracic aorta measures 3.2 cm. Pulmonary arteries are patent. Mediastinum/Nodes: No evidence for chest lymphadenopathy. No significant pericardial fluid. Lungs/Pleura: Trachea and mainstem bronchi are patent. Focal pleural thickening or nodularity along the left major fissure on sequence 7, image 80 measuring up to 5 mm. Otherwise, the lungs are clear. No significant airspace disease or consolidation. No pleural effusions. Musculoskeletal: No acute bone abnormality. Review of the MIP images confirms the above findings. CTA ABDOMEN AND PELVIS FINDINGS VASCULAR Aorta: Normal caliber aorta without aneurysm, dissection, vasculitis or significant stenosis. Celiac: Patent without evidence of aneurysm, dissection, vasculitis or significant stenosis. SMA: Patent without evidence of aneurysm, dissection, vasculitis or significant stenosis. Renals: Both renal arteries are patent without evidence of aneurysm, dissection,  vasculitis, fibromuscular dysplasia or significant stenosis. There is an accessory inferior left renal artery which is patent. IMA: Patent without evidence of aneurysm, dissection, vasculitis or significant stenosis. Inflow: Mild wall calcifications in the iliac arteries without significant stenosis. Internal and external iliac arteries are patent bilaterally. No dissection. Veins: No obvious venous abnormality within the limitations of this arterial phase study. Review of the MIP images confirms the above findings. NON-VASCULAR Hepatobiliary: No acute abnormality to the liver or gallbladder. Pancreas: Normal appearance of the pancreas without inflammation or duct dilatation. Spleen: Normal appearance of spleen without enlargement. Adrenals/Urinary Tract: Normal adrenal glands. Normal appearance of both kidneys without suspicious lesion or hydronephrosis. Urinary bladder is decompressed with a Foley catheter. Stomach/Bowel: There is a large amount of stool in the right colon and hepatic flexure. No evidence for bowel obstruction or focal inflammation. Lymphatic: No significant lymph node enlargement in the abdomen and pelvis. Soft tissue in the mesentery adjacent to loops of bowel on sequence 6, image 257 is nonspecific and may represent a few mesenteric lymph nodes. Reproductive: Prostate is unremarkable. Seminal vesicles are unremarkable. Other: No significant ascites in the abdomen or pelvis. Musculoskeletal: Mild retrolisthesis at L4-L5 with some disc space narrowing. Refer to recent  spine MRI. Review of the MIP images confirms the above findings. IMPRESSION: Negative for an aortic dissection. No acute intrathoracic or intra-abdominal disease. 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 Ascending thoracic aorta measures up to 4.2 cm. Recommend annual imaging followup by CTA or MRA. This recommendation follows 2010 ACCF/AHA/AATS/ACR/ASA/SCA/SCAI/SIR/STS/SVM Guidelines for the Diagnosis and  Management of Patients with Thoracic Aortic Disease. Circulation. 2010; 121: [id number] 5 mm nodule along the left major fissure is indeterminate. No follow-up needed if patient is low-risk. Non-contrast chest CT can be considered in 12 months if patient is high-risk. This recommendation follows the consensus statement: Guidelines for Management of Incidental Pulmonary Nodules Detected on CT Images: From the Fleischner Society 2017; Radiology 2017; 284:228-243. Large amount of stool involving the right colon. Electronically Signed   By: Richarda Overlie M.D.   On: 05/26/2016 15:24    Lab Data:  CBC:  Recent Labs Lab 05/26/16 0928 05/27/16 0348 05/28/16 0524 05/29/16 1036 05/30/16 0314  WBC 6.1 5.8 8.6 11.2* 8.5  NEUTROABS 3.0  --   --   --   --   HGB 15.1 15.0 14.7 13.5 12.8*  HCT 43.6 43.9 42.7 39.6 38.4*  MCV 85.7 85.7 85.9 86.8 86.3  PLT 231 254 262 237 201   Basic Metabolic Panel:  Recent Labs Lab 05/26/16 0928 05/27/16 0348 05/28/16 0524 05/29/16 1036 05/30/16 0314  NA 135 135 135 139 137  K 4.5 5.1 4.7 4.0 4.2  CL 100* 98* 102 105 103  CO2 27 23 24 24 24   GLUCOSE 112* 133* 156* 141* 160*  BUN 13 16 22* 27* 23*  CREATININE 1.34* 1.41* 1.29* 1.25* 1.15  CALCIUM 10.0 10.1 9.8 9.4 9.1   GFR: Estimated Creatinine Clearance: 92.2 mL/min (by C-G formula based on SCr of 1.15 mg/dL). Liver Function Tests:  Recent Labs Lab 05/23/16 2229  AST 26  ALT 27  ALKPHOS 71  BILITOT 0.7  PROT 7.6  ALBUMIN 4.5    Recent Labs Lab 05/23/16 2229  LIPASE 31   No results for input(s): AMMONIA in the last 168 hours. Coagulation Profile: No results for input(s): INR, PROTIME in the last 168 hours. Cardiac Enzymes: No results for input(s): CKTOTAL, CKMB, CKMBINDEX, TROPONINI in the last 168 hours. BNP (last 3 results) No results for input(s): PROBNP in the last 8760 hours. HbA1C: No results for input(s): HGBA1C in the last 72 hours. CBG: No results for input(s): GLUCAP in the  last 168 hours. Lipid Profile: No results for input(s): CHOL, HDL, LDLCALC, TRIG, CHOLHDL, LDLDIRECT in the last 72 hours. Thyroid Function Tests: No results for input(s): TSH, T4TOTAL, FREET4, T3FREE, THYROIDAB in the last 72 hours. Anemia Panel: No results for input(s): VITAMINB12, FOLATE, FERRITIN, TIBC, IRON, RETICCTPCT in the last 72 hours. Urine analysis:    Component Value Date/Time   COLORURINE YELLOW 05/26/2016 0927   APPEARANCEUR CLEAR 05/26/2016 0927   LABSPEC 1.013 05/26/2016 0927   PHURINE 5.0 05/26/2016 0927   GLUCOSEU NEGATIVE 05/26/2016 0927   HGBUR NEGATIVE 05/26/2016 0927   BILIRUBINUR NEGATIVE 05/26/2016 0927   KETONESUR NEGATIVE 05/26/2016 0927   PROTEINUR NEGATIVE 05/26/2016 0927   NITRITE NEGATIVE 05/26/2016 0927   LEUKOCYTESUR NEGATIVE 05/26/2016 1610     RAI,RIPUDEEP M.D. [facility] 05/30/2016, 12:04 PM  Pager: [phone] Between 7am to 7pm - call Pager - [phone]  After 7pm go to [email or web address] - password [email or web address]  Call night coverage person covering after 7pm

## 2016-05-31 MED ORDER — LACTULOSE 10 GM/15ML PO SOLN
30.0000 g | Freq: Two times a day (BID) | ORAL | Status: DC
  Administered 2016-05-31 – 2016-06-01 (×2): 30 g via ORAL
  Filled 2016-05-31 (×3): qty 45

## 2016-05-31 NOTE — Progress Notes (Signed)
Subjective: Interval History: Some blood in tissue when he wiped himself.  Stool hemoccult positive.  GI feels constipation/hemorrhoid related.  Some improvement in right leg.   Objective: Vital signs in last 24 hours: Temp:  [97.6 F (36.4 C)-98.9 F (37.2 C)] 98 F (36.7 C) (01/21 0932) Pulse Rate:  [57-71] 62 (01/21 0932) Resp:  [18-20] 18 (01/21 0932) BP: (140-171)/(66-85) 171/85 (01/21 0932) SpO2:  [97 %-100 %] 100 % (01/21 0932)  Intake/Output from previous day: 01/20 0701 - 01/21 0700 In: 500 [P.O.:500] Out: 3300 [Urine:3300] Intake/Output this shift: No intake/output data recorded. Nutritional status: Diet clear liquid Room service appropriate? Yes; Fluid consistency: Thin  Neurologic Exam:  Awake, alert, fully oriented. RLE 2/5 - able to move side to side. LLE 5/5 Bil UE 5/5 Decreased sensation in LLE vs RLE Foley in place.  Lab Results:  Recent Labs  05/29/16 1036 05/30/16 0314  WBC 11.2* 8.5  HGB 13.5 12.8*  HCT 39.6 38.4*  PLT 237 201  NA 139 137  K 4.0 4.2  CL 105 103  CO2 24 24  GLUCOSE 141* 160*  BUN 27* 23*  CREATININE 1.25* 1.15  CALCIUM 9.4 9.1   Lipid Panel No results for input(s): CHOL, TRIG, HDL, CHOLHDL, VLDL, LDLCALC in the last 72 hours.  Studies/Results: Dg Abd Portable 1v  Result Date: 05/30/2016 CLINICAL DATA:  Abdominal distension.  Constipation EXAM: PORTABLE ABDOMEN - 1 VIEW COMPARISON:  None. FINDINGS: There is moderate volume stool in the ascending colon. Gas throughout the LEFT colon and rectum. Gas in the transverse colon. IMPRESSION: Moderate volume stool in the RIGHT colon could indicate constipation. No evidence of high-grade obstruction. Gaseous distention throughout the colon. Electronically Signed   By: Genevive BiStewart  Edmunds M.D.   On: 05/30/2016 17:12    Medications:  Scheduled: . amLODipine  10 mg Oral Daily  . hydrocortisone  25 mg Rectal Daily  . lactulose  30 g Oral BID  . methylPREDNISolone (SOLU-MEDROL)  injection  1,000 mg Intravenous Q24H  . pantoprazole  40 mg Oral Daily  . polyethylene glycol  34 g Oral BID  . 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  Consider conventional angiogram of spine if radiology able to do here.    Steroids seem to be helping the swelling in the spine based on clinical grounds.  We will need repeat MRI T-spine in about a month to follow up on edema and ensure resorption of blood.  The steroids may also have caused some GI inflammation causing some bleeding.  We will be stopping the steroids soon after completing full 5 days so hopefully will not be an issue.   Cont PT/OT.     LOS: 5 days   Weston SettleESHRAGHI, Jonnae Fonseca, MD 05/31/2016  10:38 AM

## 2016-05-31 NOTE — Progress Notes (Signed)
[facility]                                                                              Patient Demographics  Perry Morrison, is a 56 y.o. male, DOB - 1961/03/09, ZOX:[id number]  Admit date - 05/26/2016   Admitting Physician Ozella Rocks, MD  Outpatient Primary MD for the patient is No PCP Per Patient  Outpatient specialists:   LOS - 5  days    Chief Complaint  Patient presents with  . Leg Pain  . Abdominal Pain  . Urinary Frequency       Brief summary   Perry Morrison is a 56 y.o. male with medical history significant for hypertension this emergency Department chief complaint of persistent abdominal pain and sudden onset right foot leg weakness/numbness tingling.  MRI of the T-spine was concerning for abnormal signal within the thoracic cord extending from lower T3 through the mid T10 level suggestive of edema of indeterminate etiology.  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 - Neurosurgery was consulted and seen by Dr. Danielle Dess, did not think neurosurgical issue, recommended IV steroids, PTOT - MRI of the brain showed no demyelinating disease, vasculitis or infectious/inflammatory process.  MRA showed no spinal AVM or fistula. - CSF culture negative.  - Per neurology recommendations, on high-dose IV Solu-Medrol day # 5/5, will need oral steroid taper after completion per Dr Benedict Needy  - Continue PTOT-> CIR  Active problems Lower GI bleed - Likely due to straining and internal hemorrhoids per GI  - Increase the bowel regimen placed on lactulose, increase MiraLAX, continue Senokot,    Urinary retention. Patient recent onset of frequency and difficulty initiating  urine flow. More likely neurogenic related to above vs obstruction. Prostate exam within the limits of normal. Foley inserted in the emergency department and immediately drained 1 L of clear urine -Continue Foley for 1 week then voiding trials  Hypertension.  - Currently stable Not on any antihypertensive medications PTA, started on Norvasc, will increase to 10 mg daily. -Continue PRN hydralazine   Acute kidney injury. Creatinine 1.3. May be related to decreased oral intake in the setting of urinary retention - Hold nephrotoxins - Continue IV fluid hydration, renal ultrasound negative for any obstruction or hydronephrosis - Creatinine function improving   Constipation.  -Placed on bowel regimen   Code Status: Full CODE STATUS DVT Prophylaxis:   SCD's Family Communication: Discussed in detail with the patient, all imaging results, lab results explained to the patient    Disposition Plan: CIR hopefully tomorrow if bed is available   Time Spent in minutes   25 minutes  Procedures:    Consultants:   Neurosurgery Neurology GI  Antimicrobials:      Medications  Scheduled Meds: . amLODipine  10 mg Oral Daily  . hydrocortisone  25 mg Rectal Daily  . lactulose  30 g  Oral BID  . methylPREDNISolone (SOLU-MEDROL) injection  1,000 mg Intravenous Q24H  . pantoprazole  40 mg Oral Daily  . polyethylene glycol  34 g Oral BID  . senna-docusate  1 tablet Oral BID   Continuous Infusions: . sodium chloride 100 mL/hr at 05/30/16 2247   PRN Meds:.acetaminophen **OR** acetaminophen, hydrALAZINE, HYDROcodone-acetaminophen, ondansetron **OR** ondansetron (ZOFRAN) IV   Antibiotics   Anti-infectives    None        Subjective:   Perry Morrison was seen and examined today. Mild improvement in the right leg, per patient very constipated. Patient denies dizziness, chest pain, shortness of breath. No acute events overnight.    Objective:   Vitals:   05/31/16 0122 05/31/16  0535 05/31/16 0932 05/31/16 1339  BP: (!) 163/73 (!) 160/82 (!) 171/85 116/70  Pulse: 62 (!) 57 62 74  Resp: 20 20 18 20   Temp: 98.2 F (36.8 C) 97.6 F (36.4 C) 98 F (36.7 C) 99.1 F (37.3 C)  TempSrc: Oral Oral Oral Oral  SpO2: 98% 99% 100% 99%  Weight:      Height:        Intake/Output Summary (Last 24 hours) at 05/31/16 1431 Last data filed at 05/31/16 0544  Gross per 24 hour  Intake              500 ml  Output             2300 ml  Net            -1800 ml     Wt Readings from Last 3 Encounters:  05/26/16 102.1 kg (225 lb)     Exam  General: Alert and oriented x 3, NAD  HEENT:    Neck:   Cardiovascular: S1 S2 clear, RRR  Respiratory: CTAB  Gastrointestinal: Soft, NT, NBS, distended  Ext: no cyanosis clubbing or edema  Neuro:  not able to lift his right leg  Skin: No rashes  Psych: Normal affect and demeanor, alert and oriented x3    Data Reviewed:  I have personally reviewed following labs and imaging studies  Micro Results Recent Results (from the past 240 hour(s))  CSF culture     Status: None   Collection Time: 05/26/16  4:00 PM  Result Value Ref Range Status   Specimen Description CSF  Final   Special Requests NONE  Final   Gram Stain   Final    CYTOSPIN SMEAR WBC PRESENT,BOTH PMN AND MONONUCLEAR RBC PRESENT NO ORGANISMS SEEN    Culture NO GROWTH 3 DAYS  Final   Report Status 05/29/2016 FINAL  Final    Radiology Reports Ct Head Wo Contrast  Result Date: 05/26/2016 CLINICAL DATA:  Right leg numbness with decreased movement of right foot. EXAM: CT HEAD WITHOUT CONTRAST TECHNIQUE: Contiguous axial images were obtained from the base of the skull through the vertex without intravenous contrast. COMPARISON:  None. FINDINGS: Brain: No evidence of acute infarction, hemorrhage, hydrocephalus, extra-axial collection or mass lesion/mass effect. Vascular: No hyperdense vessel or unexpected calcification. Skull: Normal. Negative for fracture or  focal lesion. Sinuses/Orbits: No acute finding. Other: None. IMPRESSION: Normal head CT. Electronically Signed   By: Irish Lack M.D.   On: 05/26/2016 15:21   Mr Laqueta Jean ZO Contrast  Result Date: 05/27/2016 CLINICAL DATA:  Evaluate for intracranial white matter abnormalities in a patient with paraplegia. EXAM: MRI HEAD WITHOUT AND WITH CONTRAST TECHNIQUE: Multiplanar, multiecho pulse sequences of the brain and surrounding structures were obtained without and  with intravenous contrast. CONTRAST:  20mL MULTIHANCE GADOBENATE DIMEGLUMINE 529 MG/ML IV SOLN COMPARISON:  MRI thoracic spine 05/26/2016. FINDINGS: Brain: No evidence for acute infarction, hemorrhage, mass lesion, hydrocephalus, or extra-axial fluid. Normal for age cerebral volume. No significant white matter signal abnormality to suggest demyelinating disease, vasculitis, or inflammatory/infectious process. Post infusion, no abnormal enhancement of the brain or meninges. Vascular: Flow voids are maintained throughout the carotid, basilar, and vertebral arteries. There are no areas of chronic hemorrhage. Skull and upper cervical spine: Unremarkable visualized calvarium, skullbase, and cervical vertebrae. Pituitary, pineal, cerebellar tonsils unremarkable. No upper cervical cord lesions. Sinuses/Orbits: No orbital masses or proptosis. Globes appear symmetric. Sinuses appear well aerated, without evidence for air-fluid level. Within limits for assessment on routine brain MR, normal-appearing optic nerves, optic chiasm, and tracts. Other: No nasopharyngeal pathology or mastoid fluid. Scalp and other visualized extracranial soft tissues grossly unremarkable. IMPRESSION: No significant white matter signal abnormalities to suggest demyelinating disease, vasculitis, or infectious/inflammatory process. No abnormal postcontrast enhancement to suggest a granulomatous process such as neurosarcoid. Within limits for assessment on routine brain MR, negative orbits;  no evidence for demyelinating process of the anterior optic pathways. Electronically Signed   By: Elsie Stain M.D.   On: 05/27/2016 17:17   Mr Thoracic Spine W Wo Contrast  Result Date: 05/26/2016 EXAM: MRI THORACIC AND LUMBAR SPINE WITHOUT AND WITH CONTRAST TECHNIQUE: Multiplanar and multiecho pulse sequences of the thoracic and lumbar spine were obtained without and with intravenous contrast. CONTRAST:  20mL MULTIHANCE GADOBENATE DIMEGLUMINE 529 MG/ML IV SOLN COMPARISON:  None. FINDINGS: MRI THORACIC SPINE FINDINGS Alignment:  Minimal curvature. Vertebrae: Mild edema within the right C7 facet possibly degenerative in origin. Cord: Abnormal signal within the cord extends from the lower T3 through the mid T10 level. Additionally, blood breakdown products are noted within the right aspect of the cord extending from the T5-6 level to the lower T9 level (maximal at the T7-8 level). Mild enhancement of the cord T6-7 through upper T8 level. Paraspinal and other soft tissues: Mild dilation thoracic aorta with ascending thoracic aorta measuring up to 4.2 cm. Disc levels: C6-7: Moderate bulge/ osteophyte with mild spinal stenosis and minimal cord flattening. Axial images not obtained. Scattered minimal bulges throughout the thoracic region without large thoracic disc herniation causing cord compression. MRI LUMBAR SPINE FINDINGS Segmentation:  Last fully open disc space labeled L5-S1. Alignment:  Mild straightening and slight curvature. Vertebrae:  Endplate degenerative changes L4-5. Conus medullaris: Extends to the T12-L1 level. Paraspinal and other soft tissues: Tiny right renal cyst. Disc levels: L1-2:  Negative. L2-3: Mild facet degenerative changes. Minimal bulge greater left lateral position touching but not compressing exiting left L2 nerve root. L3-4: Bulge. Facet degenerative changes. Mild bilateral foraminal narrowing. Multifactorial mild spinal stenosis and lateral recess narrowing bilaterally. L4-5: Facet  degenerative changes and ligamentum flavum hypertrophy greater on the right. Disc degeneration with disc space narrowing endplate reactive changes greater on the right. Bulge and osteophyte greater right foraminal/ lateral position encroaching upon exiting right L4 nerve root. Moderate narrowing right lateral thecal sac and right lateral recess. Mild spinal stenosis greater on right. L5-S1: Moderate facet degenerative changes. Bulge with osteophyte. Mild lateral recess narrowing. Mild foraminal narrowing greater on the left. IMPRESSION: MRI THORACIC SPINE Abnormal signal within the thoracic cord extends from the lower T3 through the mid T10 level suggestive of edema. Additionally, blood breakdown products are noted within the right aspect of the cord extending from the T5-6 level to the lower T9  level (maximal at the T7-8 level). Mild enhancement of the cord T6-7 through upper T8 level. Etiology of thoracic cord abnormality is indeterminate. Considerations include: Hemorrhagic transverse myelitis secondary to viral trigger (infection or vaccination). Patient reports possible right-sided shingles in the past and therefore this may be related to such. Result of myelitis secondary to recent infection (such as influenza) with hemorrhagic conversion or ADEM with hemorrhagic conversion are considerations. Thoracic cord ischemia with hemorrhagic conversion. Given the fact the patient has a slightly dilated ascending thoracic aorta, CT angiogram of the chest abdomen and pelvis may be considered to exclude dissection not detected on the current exam. Vascular malformation such as a cavernoma with bleeding and surrounding edema. Although there is blush like enhancement, appearance is not typical for dural fistula as vessels are not seen along the periphery of the cord. Additionally, symptoms occurred more acutely than expected with dural fistula. Post inflammatory myelitis/vasculitis with bleeding (such as Behcet's disease,  sarcoidosis, Wegener's, etc.). Tumor felt last likely consideration (ependymomas can bleed but usually enhance more). Metastatic disease felt less likely unless the patient has a known malignancy with hemorrhagic propensity such as melanoma. Neuromyelitis optica. Multiple sclerosis with hemorrhagic conversion felt less likely consideration. C6-7 moderate bulge/ osteophyte with mild spinal stenosis and minimal cord flattening. Axial images not obtained. Scattered minimal bulges throughout the thoracic region without large thoracic disc herniation causing cord compression. Mild edema within the right C7 facet possibly degenerative in origin. MRI LUMBAR SPINE Multi-level degenerative changes most prominent L4-5 level as detailed above. These results were called by telephone at the time of interpretation on 05/26/2016 at 12:44 pm to Dr. Karma Ganja, who verbally acknowledged these results. Electronically Signed   By: Lacy Duverney M.D.   On: 05/26/2016 13:15   Mr Lumbar Spine W Wo Contrast  Result Date: 05/26/2016 EXAM: MRI THORACIC AND LUMBAR SPINE WITHOUT AND WITH CONTRAST TECHNIQUE: Multiplanar and multiecho pulse sequences of the thoracic and lumbar spine were obtained without and with intravenous contrast. CONTRAST:  20mL MULTIHANCE GADOBENATE DIMEGLUMINE 529 MG/ML IV SOLN COMPARISON:  None. FINDINGS: MRI THORACIC SPINE FINDINGS Alignment:  Minimal curvature. Vertebrae: Mild edema within the right C7 facet possibly degenerative in origin. Cord: Abnormal signal within the cord extends from the lower T3 through the mid T10 level. Additionally, blood breakdown products are noted within the right aspect of the cord extending from the T5-6 level to the lower T9 level (maximal at the T7-8 level). Mild enhancement of the cord T6-7 through upper T8 level. Paraspinal and other soft tissues: Mild dilation thoracic aorta with ascending thoracic aorta measuring up to 4.2 cm. Disc levels: C6-7: Moderate bulge/ osteophyte with  mild spinal stenosis and minimal cord flattening. Axial images not obtained. Scattered minimal bulges throughout the thoracic region without large thoracic disc herniation causing cord compression. MRI LUMBAR SPINE FINDINGS Segmentation:  Last fully open disc space labeled L5-S1. Alignment:  Mild straightening and slight curvature. Vertebrae:  Endplate degenerative changes L4-5. Conus medullaris: Extends to the T12-L1 level. Paraspinal and other soft tissues: Tiny right renal cyst. Disc levels: L1-2:  Negative. L2-3: Mild facet degenerative changes. Minimal bulge greater left lateral position touching but not compressing exiting left L2 nerve root. L3-4: Bulge. Facet degenerative changes. Mild bilateral foraminal narrowing. Multifactorial mild spinal stenosis and lateral recess narrowing bilaterally. L4-5: Facet degenerative changes and ligamentum flavum hypertrophy greater on the right. Disc degeneration with disc space narrowing endplate reactive changes greater on the right. Bulge and osteophyte greater right foraminal/ lateral position  encroaching upon exiting right L4 nerve root. Moderate narrowing right lateral thecal sac and right lateral recess. Mild spinal stenosis greater on right. L5-S1: Moderate facet degenerative changes. Bulge with osteophyte. Mild lateral recess narrowing. Mild foraminal narrowing greater on the left. IMPRESSION: MRI THORACIC SPINE Abnormal signal within the thoracic cord extends from the lower T3 through the mid T10 level suggestive of edema. Additionally, blood breakdown products are noted within the right aspect of the cord extending from the T5-6 level to the lower T9 level (maximal at the T7-8 level). Mild enhancement of the cord T6-7 through upper T8 level. Etiology of thoracic cord abnormality is indeterminate. Considerations include: Hemorrhagic transverse myelitis secondary to viral trigger (infection or vaccination). Patient reports possible right-sided shingles in the past  and therefore this may be related to such. Result of myelitis secondary to recent infection (such as influenza) with hemorrhagic conversion or ADEM with hemorrhagic conversion are considerations. Thoracic cord ischemia with hemorrhagic conversion. Given the fact the patient has a slightly dilated ascending thoracic aorta, CT angiogram of the chest abdomen and pelvis may be considered to exclude dissection not detected on the current exam. Vascular malformation such as a cavernoma with bleeding and surrounding edema. Although there is blush like enhancement, appearance is not typical for dural fistula as vessels are not seen along the periphery of the cord. Additionally, symptoms occurred more acutely than expected with dural fistula. Post inflammatory myelitis/vasculitis with bleeding (such as Behcet's disease, sarcoidosis, Wegener's, etc.). Tumor felt last likely consideration (ependymomas can bleed but usually enhance more). Metastatic disease felt less likely unless the patient has a known malignancy with hemorrhagic propensity such as melanoma. Neuromyelitis optica. Multiple sclerosis with hemorrhagic conversion felt less likely consideration. C6-7 moderate bulge/ osteophyte with mild spinal stenosis and minimal cord flattening. Axial images not obtained. Scattered minimal bulges throughout the thoracic region without large thoracic disc herniation causing cord compression. Mild edema within the right C7 facet possibly degenerative in origin. MRI LUMBAR SPINE Multi-level degenerative changes most prominent L4-5 level as detailed above. These results were called by telephone at the time of interpretation on 05/26/2016 at 12:44 pm to Dr. Karma Ganja, who verbally acknowledged these results. Electronically Signed   By: Lacy Duverney M.D.   On: 05/26/2016 13:15   US Renal  Result Date: 05/27/2016 CLINICAL DATA:  Acute renal failure EXAM: RENAL / URINARY TRACT ULTRASOUND COMPLETE COMPARISON:  None. FINDINGS: Right  Kidney: Length: 10.2 cm. Echogenicity within normal limits. No mass or hydronephrosis visualized. Left Kidney: Length: 11.2 cm. Echogenicity within normal limits. No mass or hydronephrosis visualized. Bladder: Decompressed by Foley catheter. IMPRESSION: No evidence for hydronephrosis. Electronically Signed   By: Kennith Center M.D.   On: 05/27/2016 17:51   Mr [facility] W Wo Contrast  Result Date: 05/27/2016 CLINICAL DATA:  Thoracic Spinal cord hemorrhagic lesion with edema. Paraplegia. EXAM: MRA SPINE WITHOUT AND WITH CONTRAST TECHNIQUE: Multiplanar and multiecho pulse sequences of the spine were obtained without and with intravenous contrast. Angiographic images of the spine were obtained using MRA technique without and with intravenous contrast. CONTRAST:  See below 20mL MULTIHANCE GADOBENATE DIMEGLUMINE 529 MG/ML IV SOLN COMPARISON:  MRI brain reported separately FINDINGS: There is good opacification of the spinal arteries and veins. No spinal AVM or fistula is identified. The cord remains enlarged, T2 hyperintense, with heterogeneity similar to 05/26/2016. See differential considerations as outlined on that study. IMPRESSION: No spinal AVM or fistula is identified. Electronically Signed   By: Dale Lyons.D.  On: 05/27/2016 16:56   Dg Abdomen Acute W/chest  Result Date: 05/24/2016 CLINICAL DATA:  Abdominal pain EXAM: DG ABDOMEN ACUTE W/ 1V CHEST COMPARISON:  None. FINDINGS: The lungs are clear.  Cardiomediastinal contours are normal. No free intraperitoneal air. There is a large amount of stool within the colon. No dilated small bowel is identified. IMPRESSION: 1. Clear lungs. 2. No free intraperitoneal air. 3. Large amount of stool within the colon. No radiographic evidence of small-bowel obstruction. Electronically Signed   By: Deatra RobinsonKevin  Herman M.D.   On: 05/24/2016 01:57   Dg Abd Portable 1v  Result Date: 05/30/2016 CLINICAL DATA:  Abdominal distension.  Constipation EXAM: PORTABLE ABDOMEN  - 1 VIEW COMPARISON:  None. FINDINGS: There is moderate volume stool in the ascending colon. Gas throughout the LEFT colon and rectum. Gas in the transverse colon. IMPRESSION: Moderate volume stool in the RIGHT colon could indicate constipation. No evidence of high-grade obstruction. Gaseous distention throughout the colon. Electronically Signed   By: Genevive BiStewart  Edmunds M.D.   On: 05/30/2016 17:12   Ct Angio Chest/abd/pel For Dissection W And/or Wo Contrast  Result Date: 05/26/2016 CLINICAL DATA:  Low back pain and abdominal pain. Evidence for thoracic cord ischemia with hemorrhagic conversion on recent MRI. CT angiogram was recommended to exclude an aortic dissection. EXAM: CT ANGIOGRAPHY CHEST, ABDOMEN AND PELVIS TECHNIQUE: Multidetector CT imaging through the chest, abdomen and pelvis was performed using the standard protocol during bolus administration of intravenous contrast. Multiplanar reconstructed images and MIPs were obtained and reviewed to evaluate the vascular anatomy. CONTRAST:  100 mL Isovue 370 COMPARISON:  Thoracic spine MRI 05/26/2016 FINDINGS: CTA CHEST FINDINGS Cardiovascular: The aortic root at the sinuses of Valsalva measures 4.5 cm. The mid ascending thoracic aorta is aneurysmal measuring up to 4.2 cm. 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 The great vessels are patent. Negative for an aortic dissection. Proximal descending thoracic aorta measures 3.2 cm. Pulmonary arteries are patent. Mediastinum/Nodes: No evidence for chest lymphadenopathy. No significant pericardial fluid. Lungs/Pleura: Trachea and mainstem bronchi are patent. Focal pleural thickening or nodularity along the left major fissure on sequence 7, image 80 measuring up to 5 mm. Otherwise, the lungs are clear. No significant airspace disease or consolidation. No pleural effusions. Musculoskeletal: No acute bone abnormality. Review of the MIP images confirms the above  findings. CTA ABDOMEN AND PELVIS FINDINGS VASCULAR Aorta: Normal caliber aorta without aneurysm, dissection, vasculitis or significant stenosis. Celiac: Patent without evidence of aneurysm, dissection, vasculitis or significant stenosis. SMA: Patent without evidence of aneurysm, dissection, vasculitis or significant stenosis. Renals: Both renal arteries are patent without evidence of aneurysm, dissection, vasculitis, fibromuscular dysplasia or significant stenosis. There is an accessory inferior left renal artery which is patent. IMA: Patent without evidence of aneurysm, dissection, vasculitis or significant stenosis. Inflow: Mild wall calcifications in the iliac arteries without significant stenosis. Internal and external iliac arteries are patent bilaterally. No dissection. Veins: No obvious venous abnormality within the limitations of this arterial phase study. Review of the MIP images confirms the above findings. NON-VASCULAR Hepatobiliary: No acute abnormality to the liver or gallbladder. Pancreas: Normal appearance of the pancreas without inflammation or duct dilatation. Spleen: Normal appearance of spleen without enlargement. Adrenals/Urinary Tract: Normal adrenal glands. Normal appearance of both kidneys without suspicious lesion or hydronephrosis. Urinary bladder is decompressed with a Foley catheter. Stomach/Bowel: There is a large amount of stool in the right colon and hepatic flexure. No evidence for bowel obstruction  or focal inflammation. Lymphatic: No significant lymph node enlargement in the abdomen and pelvis. Soft tissue in the mesentery adjacent to loops of bowel on sequence 6, image 257 is nonspecific and may represent a few mesenteric lymph nodes. Reproductive: Prostate is unremarkable. Seminal vesicles are unremarkable. Other: No significant ascites in the abdomen or pelvis. Musculoskeletal: Mild retrolisthesis at L4-L5 with some disc space narrowing. Refer to recent spine MRI. Review of the  MIP images confirms the above findings. IMPRESSION: Negative for an aortic dissection. No acute intrathoracic or intra-abdominal disease. 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 Ascending thoracic aorta measures up to 4.2 cm. Recommend annual imaging followup by CTA or MRA. This recommendation follows 2010 ACCF/AHA/AATS/ACR/ASA/SCA/SCAI/SIR/STS/SVM Guidelines for the Diagnosis and Management of Patients with Thoracic Aortic Disease. Circulation. 2010; 121: [id number] 5 mm nodule along the left major fissure is indeterminate. No follow-up needed if patient is low-risk. Non-contrast chest CT can be considered in 12 months if patient is high-risk. This recommendation follows the consensus statement: Guidelines for Management of Incidental Pulmonary Nodules Detected on CT Images: From the Fleischner Society 2017; Radiology 2017; 284:228-243. Large amount of stool involving the right colon. Electronically Signed   By: Richarda Overlie M.D.   On: 05/26/2016 15:24    Lab Data:  CBC:  Recent Labs Lab 05/26/16 0928 05/27/16 0348 05/28/16 0524 05/29/16 1036 05/30/16 0314  WBC 6.1 5.8 8.6 11.2* 8.5  NEUTROABS 3.0  --   --   --   --   HGB 15.1 15.0 14.7 13.5 12.8*  HCT 43.6 43.9 42.7 39.6 38.4*  MCV 85.7 85.7 85.9 86.8 86.3  PLT 231 254 262 237 201   Basic Metabolic Panel:  Recent Labs Lab 05/26/16 0928 05/27/16 0348 05/28/16 0524 05/29/16 1036 05/30/16 0314  NA 135 135 135 139 137  K 4.5 5.1 4.7 4.0 4.2  CL 100* 98* 102 105 103  CO2 27 23 24 24 24   GLUCOSE 112* 133* 156* 141* 160*  BUN 13 16 22* 27* 23*  CREATININE 1.34* 1.41* 1.29* 1.25* 1.15  CALCIUM 10.0 10.1 9.8 9.4 9.1   GFR: Estimated Creatinine Clearance: 92.2 mL/min (by C-G formula based on SCr of 1.15 mg/dL). Liver Function Tests: No results for input(s): AST, ALT, ALKPHOS, BILITOT, PROT, ALBUMIN in the last 168 hours. No results for input(s): LIPASE, AMYLASE in the last 168 hours. No results for input(s): AMMONIA  in the last 168 hours. Coagulation Profile: No results for input(s): INR, PROTIME in the last 168 hours. Cardiac Enzymes: No results for input(s): CKTOTAL, CKMB, CKMBINDEX, TROPONINI in the last 168 hours. BNP (last 3 results) No results for input(s): PROBNP in the last 8760 hours. HbA1C: No results for input(s): HGBA1C in the last 72 hours. CBG: No results for input(s): GLUCAP in the last 168 hours. Lipid Profile: No results for input(s): CHOL, HDL, LDLCALC, TRIG, CHOLHDL, LDLDIRECT in the last 72 hours. Thyroid Function Tests: No results for input(s): TSH, T4TOTAL, FREET4, T3FREE, THYROIDAB in the last 72 hours. Anemia Panel: No results for input(s): VITAMINB12, FOLATE, FERRITIN, TIBC, IRON, RETICCTPCT in the last 72 hours. Urine analysis:    Component Value Date/Time   COLORURINE YELLOW 05/26/2016 0927   APPEARANCEUR CLEAR 05/26/2016 0927   LABSPEC 1.013 05/26/2016 0927   PHURINE 5.0 05/26/2016 0927   GLUCOSEU NEGATIVE 05/26/2016 0927   HGBUR NEGATIVE 05/26/2016 0927   BILIRUBINUR NEGATIVE 05/26/2016 0927   KETONESUR NEGATIVE 05/26/2016 0927   PROTEINUR NEGATIVE 05/26/2016 0927   NITRITE NEGATIVE  05/26/2016 0927   LEUKOCYTESUR NEGATIVE 05/26/2016 9528     Eureka Valdes M.D. [facility] 05/31/2016, 2:31 PM  Pager: [phone] Between 7am to 7pm - call Pager - [phone]  After 7pm go to [email or web address] - password [email or web address]  Call night coverage person covering after 7pm

## 2016-06-01 ENCOUNTER — Encounter (HOSPITAL_COMMUNITY): Payer: Self-pay | Admitting: General Practice

## 2016-06-01 ENCOUNTER — Inpatient Hospital Stay (HOSPITAL_COMMUNITY)
Admission: RE | Admit: 2016-06-01 | Discharge: 2016-07-10 | DRG: 052 | Disposition: A | Discharge status: Home Health | Payer: Medicaid Other | Source: Intra-hospital | Attending: Physical Medicine & Rehabilitation | Admitting: Physical Medicine & Rehabilitation

## 2016-06-01 DIAGNOSIS — G8222 Paraplegia, incomplete: Secondary | ICD-10-CM | POA: Diagnosis present

## 2016-06-01 DIAGNOSIS — R200 Anesthesia of skin: Secondary | ICD-10-CM | POA: Diagnosis not present

## 2016-06-01 DIAGNOSIS — K592 Neurogenic bowel, not elsewhere classified: Secondary | ICD-10-CM | POA: Diagnosis present

## 2016-06-01 DIAGNOSIS — N183 Chronic kidney disease, stage 3 unspecified: Secondary | ICD-10-CM | POA: Diagnosis present

## 2016-06-01 DIAGNOSIS — I129 Hypertensive chronic kidney disease with stage 1 through stage 4 chronic kidney disease, or unspecified chronic kidney disease: Secondary | ICD-10-CM

## 2016-06-01 DIAGNOSIS — F419 Anxiety disorder, unspecified: Secondary | ICD-10-CM

## 2016-06-01 DIAGNOSIS — G373 Acute transverse myelitis in demyelinating disease of central nervous system: Secondary | ICD-10-CM | POA: Diagnosis present

## 2016-06-01 DIAGNOSIS — K219 Gastro-esophageal reflux disease without esophagitis: Secondary | ICD-10-CM | POA: Diagnosis not present

## 2016-06-01 DIAGNOSIS — M7989 Other specified soft tissue disorders: Secondary | ICD-10-CM | POA: Diagnosis not present

## 2016-06-01 DIAGNOSIS — G0491 Myelitis, unspecified: Secondary | ICD-10-CM

## 2016-06-01 DIAGNOSIS — R339 Retention of urine, unspecified: Secondary | ICD-10-CM

## 2016-06-01 DIAGNOSIS — F4323 Adjustment disorder with mixed anxiety and depressed mood: Secondary | ICD-10-CM | POA: Diagnosis not present

## 2016-06-01 DIAGNOSIS — M4714 Other spondylosis with myelopathy, thoracic region: Secondary | ICD-10-CM | POA: Diagnosis not present

## 2016-06-01 DIAGNOSIS — I10 Essential (primary) hypertension: Secondary | ICD-10-CM

## 2016-06-01 DIAGNOSIS — R140 Abdominal distension (gaseous): Secondary | ICD-10-CM | POA: Diagnosis not present

## 2016-06-01 DIAGNOSIS — N390 Urinary tract infection, site not specified: Secondary | ICD-10-CM

## 2016-06-01 DIAGNOSIS — K5900 Constipation, unspecified: Secondary | ICD-10-CM | POA: Diagnosis not present

## 2016-06-01 DIAGNOSIS — B961 Klebsiella pneumoniae [K. pneumoniae] as the cause of diseases classified elsewhere: Secondary | ICD-10-CM | POA: Diagnosis not present

## 2016-06-01 DIAGNOSIS — G959 Disease of spinal cord, unspecified: Secondary | ICD-10-CM | POA: Diagnosis not present

## 2016-06-01 DIAGNOSIS — N319 Neuromuscular dysfunction of bladder, unspecified: Secondary | ICD-10-CM | POA: Diagnosis present

## 2016-06-01 DIAGNOSIS — Z79899 Other long term (current) drug therapy: Secondary | ICD-10-CM | POA: Diagnosis not present

## 2016-06-01 DIAGNOSIS — Q288 Other specified congenital malformations of circulatory system: Secondary | ICD-10-CM | POA: Diagnosis not present

## 2016-06-01 DIAGNOSIS — F329 Major depressive disorder, single episode, unspecified: Secondary | ICD-10-CM

## 2016-06-01 DIAGNOSIS — R531 Weakness: Secondary | ICD-10-CM

## 2016-06-01 DIAGNOSIS — R29898 Other symptoms and signs involving the musculoskeletal system: Secondary | ICD-10-CM

## 2016-06-01 LAB — SEDIMENTATION RATE: SED RATE: 5 mm/h (ref 0–16)

## 2016-06-01 MED ORDER — ONDANSETRON HCL 4 MG/2ML IJ SOLN: 4.0000 mg | Freq: Four times a day (QID) | INTRAMUSCULAR | Status: DC | PRN

## 2016-06-01 MED ORDER — LACTULOSE 10 GM/15ML PO SOLN
30.0000 g | Freq: Two times a day (BID) | ORAL | Status: DC | PRN
  Administered 2016-06-02 – 2016-07-10 (×5): 30 g via ORAL
  Filled 2016-06-01 (×5): qty 45

## 2016-06-01 MED ORDER — SENNOSIDES-DOCUSATE SODIUM 8.6-50 MG PO TABS
1.0000 | ORAL_TABLET | Freq: Two times a day (BID) | ORAL | Status: DC
  Administered 2016-06-01 – 2016-06-02 (×2): 1 via ORAL
  Filled 2016-06-01 (×2): qty 1

## 2016-06-01 MED ORDER — HYDROCORTISONE ACETATE 25 MG RE SUPP: 25.0000 mg | Freq: Every day | RECTAL | 0 refills | Status: DC

## 2016-06-01 MED ORDER — ONDANSETRON HCL 4 MG PO TABS: 4.0000 mg | ORAL_TABLET | Freq: Four times a day (QID) | ORAL | Status: DC | PRN

## 2016-06-01 MED ORDER — AMLODIPINE BESYLATE 10 MG PO TABS: 10.0000 mg | ORAL_TABLET | Freq: Every day | ORAL | 3 refills | Status: DC

## 2016-06-01 MED ORDER — LACTULOSE 10 GM/15ML PO SOLN: 30.0000 g | Freq: Two times a day (BID) | ORAL | Status: DC | PRN

## 2016-06-01 MED ORDER — POLYETHYLENE GLYCOL 3350 17 G PO PACK: 34.0000 g | PACK | Freq: Two times a day (BID) | ORAL | 3 refills | Status: DC

## 2016-06-01 MED ORDER — ACETAMINOPHEN 325 MG PO TABS
650.0000 mg | ORAL_TABLET | Freq: Four times a day (QID) | ORAL | Status: DC | PRN
  Administered 2016-06-18 – 2016-06-21 (×3): 650 mg via ORAL
  Filled 2016-06-01 (×3): qty 2

## 2016-06-01 MED ORDER — SORBITOL 70 % SOLN
30.0000 mL | Freq: Every day | Status: DC | PRN
  Administered 2016-06-02 – 2016-07-09 (×6): 30 mL via ORAL
  Filled 2016-06-01 (×8): qty 30

## 2016-06-01 MED ORDER — ACETAMINOPHEN 650 MG RE SUPP: 650.0000 mg | Freq: Four times a day (QID) | RECTAL | Status: DC | PRN

## 2016-06-01 MED ORDER — AMLODIPINE BESYLATE 10 MG PO TABS
10.0000 mg | ORAL_TABLET | Freq: Every day | ORAL | Status: DC
  Administered 2016-06-02 – 2016-07-10 (×38): 10 mg via ORAL
  Filled 2016-06-01 (×39): qty 1

## 2016-06-01 MED ORDER — SENNOSIDES-DOCUSATE SODIUM 8.6-50 MG PO TABS: 1.0000 | ORAL_TABLET | Freq: Two times a day (BID) | ORAL | 3 refills | Status: DC

## 2016-06-01 MED ORDER — PREDNISONE 20 MG PO TABS
60.0000 mg | ORAL_TABLET | Freq: Every day | ORAL | Status: DC
  Administered 2016-06-01: 60 mg via ORAL
  Filled 2016-06-01: qty 3

## 2016-06-01 MED ORDER — PREDNISONE 20 MG PO TABS
60.0000 mg | ORAL_TABLET | Freq: Every day | ORAL | Status: AC
  Administered 2016-06-02 – 2016-06-03 (×2): 60 mg via ORAL
  Filled 2016-06-01 (×2): qty 3

## 2016-06-01 MED ORDER — HYDROCODONE-ACETAMINOPHEN 5-325 MG PO TABS
1.0000 | ORAL_TABLET | ORAL | Status: DC | PRN
  Administered 2016-06-01 – 2016-06-09 (×3): 2 via ORAL
  Administered 2016-06-09: 1 via ORAL
  Administered 2016-06-16 – 2016-07-01 (×3): 2 via ORAL
  Administered 2016-07-02: 1 via ORAL
  Filled 2016-06-01 (×8): qty 2
  Filled 2016-06-01 (×2): qty 1

## 2016-06-01 MED ORDER — POLYETHYLENE GLYCOL 3350 17 G PO PACK
34.0000 g | PACK | Freq: Two times a day (BID) | ORAL | Status: DC
  Administered 2016-06-01 – 2016-06-03 (×3): 34 g via ORAL
  Filled 2016-06-01 (×5): qty 2

## 2016-06-01 MED ORDER — HYDROCORTISONE ACETATE 25 MG RE SUPP
25.0000 mg | Freq: Every day | RECTAL | Status: DC
  Administered 2016-06-02 – 2016-07-07 (×13): 25 mg via RECTAL
  Filled 2016-06-01 (×29): qty 1

## 2016-06-01 MED ORDER — PANTOPRAZOLE SODIUM 40 MG PO TBEC
40.0000 mg | DELAYED_RELEASE_TABLET | Freq: Every day | ORAL | Status: DC
  Administered 2016-06-02 – 2016-07-10 (×38): 40 mg via ORAL
  Filled 2016-06-01 (×39): qty 1

## 2016-06-01 MED ORDER — PANTOPRAZOLE SODIUM 40 MG PO TBEC: 40.0000 mg | DELAYED_RELEASE_TABLET | Freq: Every day | ORAL | 0 refills | Status: DC

## 2016-06-01 NOTE — Progress Notes (Signed)
Subjective: No further complaints at this time still having difficulty moving his right leg.  Exam: Vitals:   06/01/16 0121 06/01/16 0529  BP: (!) 156/69 (!) 154/71  Pulse: 63 66  Resp: 20 20  Temp: 98.4 F (36.9 C) 98.1 F (36.7 C)       Gen: In bed, NAD MS: Alert and oriented, able to follow commands, speech is clear CN: Cranial nerves grossly intact Motor: Bilateral upper extremities 5/5, right lower extremity at this time is a 1/5 with inability to lift his legs at the hip, inability to abduct or abduct the right hip, extend his leg from a bent position or into a bent position. Left leg is 5/5. Sensory: Denies any significant sensory changes DTR: 2+ bilateral upper extremities and left knee jerk no right knee jerk noted  Pertinent Labs/Diagnostics: Have added tests such as SSA, SSB, and a, sedimentation rate, CRP, Ace, MO antibody, ANCA.  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    Recommendations:     06/01/2016, 11:28 AM

## 2016-06-01 NOTE — Care Management Note (Signed)
Case Management Note  Patient Details  Name: Perry Morrison MRN: [id number] Date of Birth: 06/26/1960  Subjective/Objective:                    Action/Plan: Pt discharging to CIR today. No further needs per CM.   Expected Discharge Date:  06/01/16               Expected Discharge Plan:  IP Rehab Facility  In-House Referral:     Discharge planning Services     Post Acute Care Choice:    Choice offered to:     DME Arranged:    DME Agency:     HH Arranged:    HH Agency:     Status of Service:  Completed, signed off  If discussed at [employer] Meetings, dates discussed:    Additional Comments:  Kermit BaloKelli F Colleen Kotlarz, RN 06/01/2016, 12:33 PM

## 2016-06-01 NOTE — Discharge Summary (Signed)
Physician Discharge Summary   Patient ID: Perry Morrison MRN: [id number] DOB/AGE: November 13, 1960 56 y.o.  Admit date: 05/26/2016 Discharge date: 06/01/2016  Primary Care Physician:  Perry Apple, PA-C  Discharge Diagnoses:   Marland Kitchen Myelitis (HCC)With hemorrhage-new diagnosis . Urinary retention . Constipation . Hypertension . Right leg weakness . AKI (acute kidney injury) (HCC) . GI bleed   Consults:   Neurology Gastroenterology Neurosurgery  Recommendations for Outpatient Follow-up:  1. Ambulatory referral to outpatient neurology has been sent, will need MRI in 4 weeks for follow-up 2. Please repeat CBC/BMET at next visit    DIET: Heart healthy diet    Allergies:  No Known Allergies   DISCHARGE MEDICATIONS: Current Discharge Medication List    START taking these medications   Details  amLODipine (NORVASC) 10 MG tablet Take 1 tablet (10 mg total) by mouth daily. Qty: 30 tablet, Refills: 3    hydrocortisone (ANUSOL-HC) 25 MG suppository Place 1 suppository (25 mg total) rectally daily. Qty: 12 suppository, Refills: 0    pantoprazole (PROTONIX) 40 MG tablet Take 1 tablet (40 mg total) by mouth daily. Qty: 30 tablet, Refills: 0    polyethylene glycol (MIRALAX / GLYCOLAX) packet Take 34 g by mouth 2 (two) times daily. Qty: 60 packet, Refills: 3    senna-docusate (SENOKOT-S) 8.6-50 MG tablet Take 1 tablet by mouth 2 (two) times daily. Qty: 60 tablet, Refills: 3      CONTINUE these medications which have NOT CHANGED   Details  MAGNESIUM PO Take 1 tablet by mouth daily.    Omega-3 Fatty Acids (OMEGA-3 PO) Take 1 capsule by mouth daily.      STOP taking these medications     lidocaine (LMX) 4 % cream      methocarbamol (ROBAXIN) 500 MG tablet        Medications to continue at inpatient rehabilitation  Prednisone 60 mg by mouth daily starting today 1/22 x 2 days then taper to 40mg  po daily for 2 days, then 30mg  daily for 2 days, then 20mg  for 2 days, then  10mg  for 2 days, then OFF     Brief H and P: For complete details please refer to admission H and P, but in brief Perry Redfearnis a 55 y.o.malewith medical history significant for hypertension this emergency Department chief complaint of persistent abdominal pain and sudden onset right foot leg weakness/numbness tingling.  MRI of the T-spine was concerning for abnormal signal within the thoracic cord extending from lower T3 through the mid T10 level suggestive of edema of indeterminate etiology.  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 - Neurosurgery was consulted and seen by Dr. Danielle Morrison, did not think neurosurgical issue, recommended IV steroids, PTOT - MRI of the brain showed no demyelinating disease, vasculitis or infectious/inflammatory process.  MRA showed no spinal AVM or fistula. - CSF culture negative.  - Per neurology recommendations, on high-dose IV Solu-Medrol, completed day # 5/5, will need oral steroid taper after completion per Dr Perry Morrison. I spoke with Dr Perry Morrison today, and recommended prednisone taper as outlined above starting 60 mg today . Neurology has been following and also added vasculitis workup today. - Continue PTOT-> CIR   Lower GI bleed - Likely due to straining and internal hemorrhoids per GI  -  Increase the bowel regimen placed on lactulose as needed, increase MiraLAX, continue Senokot,    Urinary retention. Patient recent onset of frequency and difficulty initiating urine flow. More likely neurogenic related to above vs obstruction.Prostate exam within the limits of normal. Foley inserted in the emergency department and immediately drained 1 L of clear urine -Continue Foley for 1 week then  voiding trials  Hypertension.  - Currently stable. Not on any antihypertensive medications PTA, started on Norvasc 10 mg daily. -Continue PRN hydralazine   Acute kidney injury. Creatinine 1.3. May be related to decreased oral intake in the setting of urinary retention - Hold nephrotoxins. Continue IV fluid hydration, renal ultrasound negative for any obstruction or hydronephrosis - Creatinine function improved.  Severe Constipation.  -Resolved, patient had good bowel movements yesterday. Continue lactulose as needed, increased MiraLAX and Senokot     Day of Discharge BP (!) 144/76 (BP Location: Right Arm)   Pulse (!) 59   Temp 98.2 F (36.8 C) (Oral)   Resp 18   Ht 6\' 1"  (1.854 m)   Wt 102.1 kg (225 lb)   SpO2 96%   BMI 29.69 kg/m   Physical Exam: General: Alert and awake oriented x3 not in any acute distress. HEENT: anicteric sclera, pupils reactive to light and accommodation CVS: S1-S2 clear no murmur rubs or gallops Chest: clear to auscultation bilaterally, no wheezing rales or rhonchi Abdomen: soft nontender, nondistended, normal bowel sounds Extremities: no cyanosis, clubbing or edema noted bilaterally Neuro: Cranial nerves II-XII intact, no focal neurological deficits   The results of significant diagnostics from this hospitalization (including imaging, microbiology, ancillary and laboratory) are listed below for reference.    LAB RESULTS: Basic Metabolic Panel:  Recent Labs Lab 05/29/16 1036 05/30/16 0314  NA 139 137  K 4.0 4.2  CL 105 103  CO2 24 24  GLUCOSE 141* 160*  BUN 27* 23*  CREATININE 1.25* 1.15  CALCIUM 9.4 9.1   Liver Function Tests: No results for input(s): AST, ALT, ALKPHOS, BILITOT, PROT, ALBUMIN in the last 168 hours. No results for input(s): LIPASE, AMYLASE in the last 168 hours. No results for input(s): AMMONIA in the last 168 hours. CBC:  Recent Labs Lab 05/26/16 0928  05/29/16 1036 05/30/16 0314  WBC 6.1  < > 11.2* 8.5   NEUTROABS 3.0  --   --   --   HGB 15.1  < > 13.5 12.8*  HCT 43.6  < > 39.6 38.4*  MCV 85.7  < > 86.8 86.3  PLT 231  < > 237 201  < > = values in this interval not displayed. Cardiac Enzymes: No results for input(s): CKTOTAL, CKMB, CKMBINDEX, TROPONINI in the last 168 hours. BNP: Invalid input(s): POCBNP CBG: No results for input(s): GLUCAP in the last 168 hours.  Significant Diagnostic Studies:  Ct Head Wo Contrast  Result Date: 05/26/2016 CLINICAL DATA:  Right leg numbness with decreased movement of right foot. EXAM: CT HEAD WITHOUT CONTRAST TECHNIQUE: Contiguous axial images were obtained from the base of the skull through the vertex without intravenous contrast. COMPARISON:  None. FINDINGS: Brain: No evidence of acute infarction, hemorrhage, hydrocephalus, extra-axial collection or mass lesion/mass effect. Vascular: No hyperdense vessel or unexpected calcification. Skull: Normal. Negative for fracture or focal lesion. Sinuses/Orbits: No acute finding. Other: None. IMPRESSION: Normal head CT. Electronically Signed   By: Irish Lack M.D.   On: 05/26/2016 15:21   Mr Laqueta Jean ZO Contrast  Result Date: 05/27/2016 CLINICAL DATA:  Evaluate for  intracranial white matter abnormalities in a patient with paraplegia. EXAM: MRI HEAD WITHOUT AND WITH CONTRAST TECHNIQUE: Multiplanar, multiecho pulse sequences of the brain and surrounding structures were obtained without and with intravenous contrast. CONTRAST:  20mL MULTIHANCE GADOBENATE DIMEGLUMINE 529 MG/ML IV SOLN COMPARISON:  MRI thoracic spine 05/26/2016. FINDINGS: Brain: No evidence for acute infarction, hemorrhage, mass lesion, hydrocephalus, or extra-axial fluid. Normal for age cerebral volume. No significant white matter signal abnormality to suggest demyelinating disease, vasculitis, or inflammatory/infectious process. Post infusion, no abnormal enhancement of the brain or meninges. Vascular: Flow voids are maintained throughout the carotid,  basilar, and vertebral arteries. There are no areas of chronic hemorrhage. Skull and upper cervical spine: Unremarkable visualized calvarium, skullbase, and cervical vertebrae. Pituitary, pineal, cerebellar tonsils unremarkable. No upper cervical cord lesions. Sinuses/Orbits: No orbital masses or proptosis. Globes appear symmetric. Sinuses appear well aerated, without evidence for air-fluid level. Within limits for assessment on routine brain MR, normal-appearing optic nerves, optic chiasm, and tracts. Other: No nasopharyngeal pathology or mastoid fluid. Scalp and other visualized extracranial soft tissues grossly unremarkable. IMPRESSION: No significant white matter signal abnormalities to suggest demyelinating disease, vasculitis, or infectious/inflammatory process. No abnormal postcontrast enhancement to suggest a granulomatous process such as neurosarcoid. Within limits for assessment on routine brain MR, negative orbits; no evidence for demyelinating process of the anterior optic pathways. Electronically Signed   By: Elsie Stain M.D.   On: 05/27/2016 17:17   Mr Thoracic Spine W Wo Contrast  Result Date: 05/26/2016 EXAM: MRI THORACIC AND LUMBAR SPINE WITHOUT AND WITH CONTRAST TECHNIQUE: Multiplanar and multiecho pulse sequences of the thoracic and lumbar spine were obtained without and with intravenous contrast. CONTRAST:  20mL MULTIHANCE GADOBENATE DIMEGLUMINE 529 MG/ML IV SOLN COMPARISON:  None. FINDINGS: MRI THORACIC SPINE FINDINGS Alignment:  Minimal curvature. Vertebrae: Mild edema within the right C7 facet possibly degenerative in origin. Cord: Abnormal signal within the cord extends from the lower T3 through the mid T10 level. Additionally, blood breakdown products are noted within the right aspect of the cord extending from the T5-6 level to the lower T9 level (maximal at the T7-8 level). Mild enhancement of the cord T6-7 through upper T8 level. Paraspinal and other soft tissues: Mild dilation  thoracic aorta with ascending thoracic aorta measuring up to 4.2 cm. Disc levels: C6-7: Moderate bulge/ osteophyte with mild spinal stenosis and minimal cord flattening. Axial images not obtained. Scattered minimal bulges throughout the thoracic region without large thoracic disc herniation causing cord compression. MRI LUMBAR SPINE FINDINGS Segmentation:  Last fully open disc space labeled L5-S1. Alignment:  Mild straightening and slight curvature. Vertebrae:  Endplate degenerative changes L4-5. Conus medullaris: Extends to the T12-L1 level. Paraspinal and other soft tissues: Tiny right renal cyst. Disc levels: L1-2:  Negative. L2-3: Mild facet degenerative changes. Minimal bulge greater left lateral position touching but not compressing exiting left L2 nerve root. L3-4: Bulge. Facet degenerative changes. Mild bilateral foraminal narrowing. Multifactorial mild spinal stenosis and lateral recess narrowing bilaterally. L4-5: Facet degenerative changes and ligamentum flavum hypertrophy greater on the right. Disc degeneration with disc space narrowing endplate reactive changes greater on the right. Bulge and osteophyte greater right foraminal/ lateral position encroaching upon exiting right L4 nerve root. Moderate narrowing right lateral thecal sac and right lateral recess. Mild spinal stenosis greater on right. L5-S1: Moderate facet degenerative changes. Bulge with osteophyte. Mild lateral recess narrowing. Mild foraminal narrowing greater on the left. IMPRESSION: MRI THORACIC SPINE Abnormal signal within the thoracic cord extends from the lower  T3 through the mid T10 level suggestive of edema. Additionally, blood breakdown products are noted within the right aspect of the cord extending from the T5-6 level to the lower T9 level (maximal at the T7-8 level). Mild enhancement of the cord T6-7 through upper T8 level. Etiology of thoracic cord abnormality is indeterminate. Considerations include: Hemorrhagic transverse  myelitis secondary to viral trigger (infection or vaccination). Patient reports possible right-sided shingles in the past and therefore this may be related to such. Result of myelitis secondary to recent infection (such as influenza) with hemorrhagic conversion or ADEM with hemorrhagic conversion are considerations. Thoracic cord ischemia with hemorrhagic conversion. Given the fact the patient has a slightly dilated ascending thoracic aorta, CT angiogram of the chest abdomen and pelvis may be considered to exclude dissection not detected on the current exam. Vascular malformation such as a cavernoma with bleeding and surrounding edema. Although there is blush like enhancement, appearance is not typical for dural fistula as vessels are not seen along the periphery of the cord. Additionally, symptoms occurred more acutely than expected with dural fistula. Post inflammatory myelitis/vasculitis with bleeding (such as Behcet's disease, sarcoidosis, Wegener's, etc.). Tumor felt last likely consideration (ependymomas can bleed but usually enhance more). Metastatic disease felt less likely unless the patient has a known malignancy with hemorrhagic propensity such as melanoma. Neuromyelitis optica. Multiple sclerosis with hemorrhagic conversion felt less likely consideration. C6-7 moderate bulge/ osteophyte with mild spinal stenosis and minimal cord flattening. Axial images not obtained. Scattered minimal bulges throughout the thoracic region without large thoracic disc herniation causing cord compression. Mild edema within the right C7 facet possibly degenerative in origin. MRI LUMBAR SPINE Multi-level degenerative changes most prominent L4-5 level as detailed above. These results were called by telephone at the time of interpretation on 05/26/2016 at 12:44 pm to Dr. Karma Ganja, who verbally acknowledged these results. Electronically Signed   By: Lacy Duverney M.D.   On: 05/26/2016 13:15   Mr Lumbar Spine W Wo  Contrast  Result Date: 05/26/2016 EXAM: MRI THORACIC AND LUMBAR SPINE WITHOUT AND WITH CONTRAST TECHNIQUE: Multiplanar and multiecho pulse sequences of the thoracic and lumbar spine were obtained without and with intravenous contrast. CONTRAST:  20mL MULTIHANCE GADOBENATE DIMEGLUMINE 529 MG/ML IV SOLN COMPARISON:  None. FINDINGS: MRI THORACIC SPINE FINDINGS Alignment:  Minimal curvature. Vertebrae: Mild edema within the right C7 facet possibly degenerative in origin. Cord: Abnormal signal within the cord extends from the lower T3 through the mid T10 level. Additionally, blood breakdown products are noted within the right aspect of the cord extending from the T5-6 level to the lower T9 level (maximal at the T7-8 level). Mild enhancement of the cord T6-7 through upper T8 level. Paraspinal and other soft tissues: Mild dilation thoracic aorta with ascending thoracic aorta measuring up to 4.2 cm. Disc levels: C6-7: Moderate bulge/ osteophyte with mild spinal stenosis and minimal cord flattening. Axial images not obtained. Scattered minimal bulges throughout the thoracic region without large thoracic disc herniation causing cord compression. MRI LUMBAR SPINE FINDINGS Segmentation:  Last fully open disc space labeled L5-S1. Alignment:  Mild straightening and slight curvature. Vertebrae:  Endplate degenerative changes L4-5. Conus medullaris: Extends to the T12-L1 level. Paraspinal and other soft tissues: Tiny right renal cyst. Disc levels: L1-2:  Negative. L2-3: Mild facet degenerative changes. Minimal bulge greater left lateral position touching but not compressing exiting left L2 nerve root. L3-4: Bulge. Facet degenerative changes. Mild bilateral foraminal narrowing. Multifactorial mild spinal stenosis and lateral recess narrowing bilaterally. L4-5: Facet  degenerative changes and ligamentum flavum hypertrophy greater on the right. Disc degeneration with disc space narrowing endplate reactive changes greater on the  right. Bulge and osteophyte greater right foraminal/ lateral position encroaching upon exiting right L4 nerve root. Moderate narrowing right lateral thecal sac and right lateral recess. Mild spinal stenosis greater on right. L5-S1: Moderate facet degenerative changes. Bulge with osteophyte. Mild lateral recess narrowing. Mild foraminal narrowing greater on the left. IMPRESSION: MRI THORACIC SPINE Abnormal signal within the thoracic cord extends from the lower T3 through the mid T10 level suggestive of edema. Additionally, blood breakdown products are noted within the right aspect of the cord extending from the T5-6 level to the lower T9 level (maximal at the T7-8 level). Mild enhancement of the cord T6-7 through upper T8 level. Etiology of thoracic cord abnormality is indeterminate. Considerations include: Hemorrhagic transverse myelitis secondary to viral trigger (infection or vaccination). Patient reports possible right-sided shingles in the past and therefore this may be related to such. Result of myelitis secondary to recent infection (such as influenza) with hemorrhagic conversion or ADEM with hemorrhagic conversion are considerations. Thoracic cord ischemia with hemorrhagic conversion. Given the fact the patient has a slightly dilated ascending thoracic aorta, CT angiogram of the chest abdomen and pelvis may be considered to exclude dissection not detected on the current exam. Vascular malformation such as a cavernoma with bleeding and surrounding edema. Although there is blush like enhancement, appearance is not typical for dural fistula as vessels are not seen along the periphery of the cord. Additionally, symptoms occurred more acutely than expected with dural fistula. Post inflammatory myelitis/vasculitis with bleeding (such as Behcet's disease, sarcoidosis, Wegener's, etc.). Tumor felt last likely consideration (ependymomas can bleed but usually enhance more). Metastatic disease felt less likely unless  the patient has a known malignancy with hemorrhagic propensity such as melanoma. Neuromyelitis optica. Multiple sclerosis with hemorrhagic conversion felt less likely consideration. C6-7 moderate bulge/ osteophyte with mild spinal stenosis and minimal cord flattening. Axial images not obtained. Scattered minimal bulges throughout the thoracic region without large thoracic disc herniation causing cord compression. Mild edema within the right C7 facet possibly degenerative in origin. MRI LUMBAR SPINE Multi-level degenerative changes most prominent L4-5 level as detailed above. These results were called by telephone at the time of interpretation on 05/26/2016 at 12:44 pm to Dr. Karma Ganja, who verbally acknowledged these results. Electronically Signed   By: Lacy Duverney M.D.   On: 05/26/2016 13:15   US Renal  Result Date: 05/27/2016 CLINICAL DATA:  Acute renal failure EXAM: RENAL / URINARY TRACT ULTRASOUND COMPLETE COMPARISON:  None. FINDINGS: Right Kidney: Length: 10.2 cm. Echogenicity within normal limits. No mass or hydronephrosis visualized. Left Kidney: Length: 11.2 cm. Echogenicity within normal limits. No mass or hydronephrosis visualized. Bladder: Decompressed by Foley catheter. IMPRESSION: No evidence for hydronephrosis. Electronically Signed   By: Kennith Center M.D.   On: 05/27/2016 17:51   Mr [facility] W Wo Contrast  Result Date: 05/27/2016 CLINICAL DATA:  Thoracic Spinal cord hemorrhagic lesion with edema. Paraplegia. EXAM: MRA SPINE WITHOUT AND WITH CONTRAST TECHNIQUE: Multiplanar and multiecho pulse sequences of the spine were obtained without and with intravenous contrast. Angiographic images of the spine were obtained using MRA technique without and with intravenous contrast. CONTRAST:  See below 20mL MULTIHANCE GADOBENATE DIMEGLUMINE 529 MG/ML IV SOLN COMPARISON:  MRI brain reported separately FINDINGS: There is good opacification of the spinal arteries and veins. No spinal AVM or fistula is  identified. The cord remains enlarged,  T2 hyperintense, with heterogeneity similar to 05/26/2016. See differential considerations as outlined on that study. IMPRESSION: No spinal AVM or fistula is identified. Electronically Signed   By: Elsie Stain M.D.   On: 05/27/2016 16:56   Ct Angio Chest/abd/pel For Dissection W And/or Wo Contrast  Result Date: 05/26/2016 CLINICAL DATA:  Low back pain and abdominal pain. Evidence for thoracic cord ischemia with hemorrhagic conversion on recent MRI. CT angiogram was recommended to exclude an aortic dissection. EXAM: CT ANGIOGRAPHY CHEST, ABDOMEN AND PELVIS TECHNIQUE: Multidetector CT imaging through the chest, abdomen and pelvis was performed using the standard protocol during bolus administration of intravenous contrast. Multiplanar reconstructed images and MIPs were obtained and reviewed to evaluate the vascular anatomy. CONTRAST:  100 mL Isovue 370 COMPARISON:  Thoracic spine MRI 05/26/2016 FINDINGS: CTA CHEST FINDINGS Cardiovascular: The aortic root at the sinuses of Valsalva measures 4.5 cm. The mid ascending thoracic aorta is aneurysmal measuring up to 4.2 cm. 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 The great vessels are patent. Negative for an aortic dissection. Proximal descending thoracic aorta measures 3.2 cm. Pulmonary arteries are patent. Mediastinum/Nodes: No evidence for chest lymphadenopathy. No significant pericardial fluid. Lungs/Pleura: Trachea and mainstem bronchi are patent. Focal pleural thickening or nodularity along the left major fissure on sequence 7, image 80 measuring up to 5 mm. Otherwise, the lungs are clear. No significant airspace disease or consolidation. No pleural effusions. Musculoskeletal: No acute bone abnormality. Review of the MIP images confirms the above findings. CTA ABDOMEN AND PELVIS FINDINGS VASCULAR Aorta: Normal caliber aorta without aneurysm, dissection, vasculitis or  significant stenosis. Celiac: Patent without evidence of aneurysm, dissection, vasculitis or significant stenosis. SMA: Patent without evidence of aneurysm, dissection, vasculitis or significant stenosis. Renals: Both renal arteries are patent without evidence of aneurysm, dissection, vasculitis, fibromuscular dysplasia or significant stenosis. There is an accessory inferior left renal artery which is patent. IMA: Patent without evidence of aneurysm, dissection, vasculitis or significant stenosis. Inflow: Mild wall calcifications in the iliac arteries without significant stenosis. Internal and external iliac arteries are patent bilaterally. No dissection. Veins: No obvious venous abnormality within the limitations of this arterial phase study. Review of the MIP images confirms the above findings. NON-VASCULAR Hepatobiliary: No acute abnormality to the liver or gallbladder. Pancreas: Normal appearance of the pancreas without inflammation or duct dilatation. Spleen: Normal appearance of spleen without enlargement. Adrenals/Urinary Tract: Normal adrenal glands. Normal appearance of both kidneys without suspicious lesion or hydronephrosis. Urinary bladder is decompressed with a Foley catheter. Stomach/Bowel: There is a large amount of stool in the right colon and hepatic flexure. No evidence for bowel obstruction or focal inflammation. Lymphatic: No significant lymph node enlargement in the abdomen and pelvis. Soft tissue in the mesentery adjacent to loops of bowel on sequence 6, image 257 is nonspecific and may represent a few mesenteric lymph nodes. Reproductive: Prostate is unremarkable. Seminal vesicles are unremarkable. Other: No significant ascites in the abdomen or pelvis. Musculoskeletal: Mild retrolisthesis at L4-L5 with some disc space narrowing. Refer to recent spine MRI. Review of the MIP images confirms the above findings. IMPRESSION: Negative for an aortic dissection. No acute intrathoracic or  intra-abdominal disease. 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 Ascending thoracic aorta measures up to 4.2 cm. Recommend annual imaging followup by CTA or MRA. This recommendation follows 2010 ACCF/AHA/AATS/ACR/ASA/SCA/SCAI/SIR/STS/SVM Guidelines for the Diagnosis and Management of Patients with Thoracic Aortic Disease. Circulation. 2010; 121: [id number] 5 mm nodule  along the left major fissure is indeterminate. No follow-up needed if patient is low-risk. Non-contrast chest CT can be considered in 12 months if patient is high-risk. This recommendation follows the consensus statement: Guidelines for Management of Incidental Pulmonary Nodules Detected on CT Images: From the Fleischner Society 2017; Radiology 2017; 284:228-243. Large amount of stool involving the right colon. Electronically Signed   By: Richarda OverlieAdam  Henn M.D.   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 Schedule an appointment as soon as possible for a visit in 2 week(s).   Specialty:  General Practice Contact information: [address] [facility] [address] [address] [place] [id number]            Time spent on Discharge: 25 minutes  Signed:   RAI,RIPUDEEP M.D. [facility] 06/01/2016, 12:32 PM Pager: [phone]

## 2016-06-01 NOTE — Progress Notes (Signed)
[name], MD Physician Signed Physical Medicine and Rehabilitation  Consult Note Date of Service: 05/28/2016 8:29 AM  Related encounter: ED to Hosp-Admission (Current) from 05/26/2016 in [facility] 75M NEURO MEDICAL     Expand All Collapse All   [] Hide copied text [] Hover for attribution information      Physical Medicine and Rehabilitation Consult Reason for Consult: Spinal cord lesion Referring Physician: [facility]   HPI: Perry Morrison is a 56 y.o. 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 Per chart review patient currently living with a male roommate that works during the day. He is going through a divorce and recently lost his job. Independent prior to admission. Most of his family is in the [place] area. Presented 05/26/2016 with abdominal pain constipation times several days. He was initially seen in the emergency room given some muscle relaxers but then developed right lower extremity weakness. An MRI of the thoracic spine demonstrated swelling in the midportion of the cord at T5 level with some right sided lesion consistent with hemorrhage within the cord down to the level of T9. There was some mass effect in the cord itself from the lesion. The rest of the cervical spine film was unremarkable. Cranial CT scan negative. MRI of the brain showed no significant white matter signal abnormalities to suggest demyelinating disease. Lumbar puncture completed total protein 81, glucose 56, CSF showed no white cells. Neurosurgery as well as neurology consulted etiology suggestive of spinal cord infarct versus vascular malformation. Presently maintained on intravenous Solu-Medrol with workup ongoing. Physical therapy evaluation completed 05/27/2016 with recommendations of physical medicine rehabilitation consult.   Review of Systems  Constitutional: Negative for chills and fever.  HENT: Negative for hearing loss and tinnitus.     Eyes: Negative for blurred vision and double vision.  Respiratory: Negative for cough and shortness of breath.   Cardiovascular: Negative for chest pain, palpitations and leg swelling.  Gastrointestinal: Positive for abdominal pain and constipation.  Genitourinary:       Urinary retention  Musculoskeletal: Positive for myalgias. Negative for falls.  Skin: Negative for rash.  Neurological: Positive for weakness. Negative for seizures.  All other systems reviewed and are negative.      Past Medical History:  Diagnosis Date  . AKI (acute kidney injury) (HCC)   . 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 Hypertension   . Myelitis (HCC)   . Right leg weakness   . Urinary retention    History reviewed. No pertinent surgical history.      Family History  Problem Relation Age of Onset  . Hypertension Mother   . AAA (abdominal aortic aneurysm) Mother    Social History:  reports that he has never smoked. He has never used smokeless tobacco. He reports that he does not drink alcohol or use drugs. Allergies: No Known Allergies       Medications Prior to Admission  Medication Sig Dispense Refill  . MAGNESIUM PO Take 1 tablet by mouth daily.    . Omega-3 Fatty Acids (OMEGA-3 PO) Take 1 capsule by mouth daily.    Marland Kitchen lidocaine (LMX) 4 % cream Apply 1 application topically 3 (three) times daily as needed. (Patient not taking: Reported on 05/26/2016) 133 g 0  . methocarbamol (ROBAXIN) 500 MG tablet Can take up to 1-2 tabs every 6 hours PRN PAIN (Patient not taking: Reported on 05/26/2016) 20 tablet 0    Home: Home  Living Family/patient expects to be discharged to:: Private residence Living Arrangements: Non-relatives/Friends Available Help at Discharge: Friend(s) Type of Home: House Home Access: Stairs to enter [employer] of Steps: 5 Entrance Stairs-Rails: Left Home Layout: Two level, 1/2 bath on main level, Bed/bath  upstairs Alternate Level Stairs-Number of Steps: flight Bathroom Shower/Tub: Tub/shower unit, Engineer, building services: Standard Bathroom Accessibility: Yes Home Equipment: None Additional Comments: pt currently living with a roommate. Going through a divorce. 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 Played football at [employer]. Has a 36 yo son who lives with ex wife Functional Status:  Mobility: Bed Mobility Overal bed mobility: Needs Assistance Bed Mobility: Supine to Sit Supine to sit: Min guard General bed mobility comments: heavy reliance on BUE strength and bedrails. Pt able to actively adduct RLE to move off bed. Also using his arms to move RLE Transfers Overall transfer level: Needs assistance Equipment used: Ambulation equipment used, Rolling walker (2 wheeled) Transfer via Lift Equipment: Stedy Transfers: Sit to/from Stand Sit to Stand: Mod assist, +2 physical assistance General transfer comment: practiced sit to stand multiple times to RW as well as stedy. Needed mod/ max A to stand with hands placed on bed. With stedy, pt able to pull self up with UE's with mod A +2. Min A to help control descent with sitting Ambulation/Gait General Gait Details: unable to ambulate safely  ADL: ADL Overall ADL's : Needs assistance/impaired Grooming: Set up Upper Body Bathing: Set up, Sitting Lower Body Bathing: Moderate assistance, Sitting/lateral leans Upper Body Dressing : Set up, Sitting Lower Body Dressing: Maximal assistance, Sitting/lateral leans Toilet Transfer: +2 for physical assistance, Moderate assistance (sit 0s tand; simulated) Toileting- Clothing Manipulation and Hygiene: Maximal assistance Toileting - Clothing Manipulation Details (indicate cue type and reason): foley Functional mobility during ADLs: +2 for physical  assistance, Moderate assistance, Rolling walker General ADL Comments: Pt unable to take steps due to B knee buckling. Able to stand with +2 mod A although heavy use of his BUE. Pt unable to cross feet over knees in sitting for LB ADL. Began education on compensatory techniques for ADL. Pt expressed relief knowing that someone was going to help him learn how to do self care.   Cognition: Cognition Overall Cognitive Status: Within Functional Limits for tasks assessed Orientation Level: Oriented X4 Cognition Arousal/Alertness: Awake/alert Behavior During Therapy: WFL for tasks assessed/performed Overall Cognitive Status: Within Functional Limits for tasks assessed General Comments: scared about situation, appropriately  Blood pressure (!) 153/73, pulse 78, temperature 97.4 F (36.3 C), temperature source Oral, resp. rate 20, height 6\' 1"  (1.854 m), weight 102.1 kg (225 lb), SpO2 97 %. Physical Exam  Constitutional: He is oriented to person, place, and time. He appears well-developed.  HENT:  Head: Normocephalic.  Eyes: EOM are normal.  Neck: Normal range of motion. Neck supple. No thyromegaly present.  Cardiovascular: Normal rate, regular rhythm and normal heart sounds.   Respiratory: Effort normal and breath sounds normal. No respiratory distress.  GI: Soft. Bowel sounds are normal. He exhibits no distension.  Genitourinary:  Genitourinary Comments: Foley in  Neurological: He is alert and oriented to person, place, and time.  Pt alert and appropriate. UE 5/5. RLE: 0 to ?trace /5 HF,KE ADF/PF. LLE: 3- to 3/5 HF, KE and 2- to 2/5 ADF/PF. Senses pain in both lower extremities but appears to have decreased proprioception. DTRs 1+. No  resting tone evdient  Skin: Skin is warm and dry.    Lab Results Last 24 Hours       Results for orders placed or performed during the hospital encounter of 05/26/16 (from the past 24 hour(s))  Basic metabolic panel     Status: Abnormal   Collection  Time: 05/28/16  5:24 AM  Result Value Ref Range   Sodium 135 135 - 145 mmol/L   Potassium 4.7 3.5 - 5.1 mmol/L   Chloride 102 101 - 111 mmol/L   CO2 24 22 - 32 mmol/L   Glucose, Bld 156 (H) 65 - 99 mg/dL   BUN 22 (H) 6 - 20 mg/dL   Creatinine, Ser 1.19 (H) 0.61 - 1.24 mg/dL   Calcium 9.8 8.9 - 14.7 mg/dL   GFR calc non Af Amer >60 >60 mL/min   GFR calc Af Amer >60 >60 mL/min   Anion gap 9 5 - 15      Imaging Results (Last 48 hours)  Ct Head Wo Contrast  Result Date: 05/26/2016 CLINICAL DATA:  Right leg numbness with decreased movement of right foot. EXAM: CT HEAD WITHOUT CONTRAST TECHNIQUE: Contiguous axial images were obtained from the base of the skull through the vertex without intravenous contrast. COMPARISON:  None. FINDINGS: Brain: No evidence of acute infarction, hemorrhage, hydrocephalus, extra-axial collection or mass lesion/mass effect. Vascular: No hyperdense vessel or unexpected calcification. Skull: Normal. Negative for fracture or focal lesion. Sinuses/Orbits: No acute finding. Other: None. IMPRESSION: Normal head CT. Electronically Signed   By: Irish Lack M.D.   On: 05/26/2016 15:21   Mr Laqueta Jean WG Contrast  Result Date: 05/27/2016 CLINICAL DATA:  Evaluate for intracranial white matter abnormalities in a patient with paraplegia. EXAM: MRI HEAD WITHOUT AND WITH CONTRAST TECHNIQUE: Multiplanar, multiecho pulse sequences of the brain and surrounding structures were obtained without and with intravenous contrast. CONTRAST:  20mL MULTIHANCE GADOBENATE DIMEGLUMINE 529 MG/ML IV SOLN COMPARISON:  MRI thoracic spine 05/26/2016. FINDINGS: Brain: No evidence for acute infarction, hemorrhage, mass lesion, hydrocephalus, or extra-axial fluid. Normal for age cerebral volume. No significant white matter signal abnormality to suggest demyelinating disease, vasculitis, or inflammatory/infectious process. Post infusion, no abnormal enhancement of the brain or meninges.  Vascular: Flow voids are maintained throughout the carotid, basilar, and vertebral arteries. There are no areas of chronic hemorrhage. Skull and upper cervical spine: Unremarkable visualized calvarium, skullbase, and cervical vertebrae. Pituitary, pineal, cerebellar tonsils unremarkable. No upper cervical cord lesions. Sinuses/Orbits: No orbital masses or proptosis. Globes appear symmetric. Sinuses appear well aerated, without evidence for air-fluid level. Within limits for assessment on routine brain MR, normal-appearing optic nerves, optic chiasm, and tracts. Other: No nasopharyngeal pathology or mastoid fluid. Scalp and other visualized extracranial soft tissues grossly unremarkable. IMPRESSION: No significant white matter signal abnormalities to suggest demyelinating disease, vasculitis, or infectious/inflammatory process. No abnormal postcontrast enhancement to suggest a granulomatous process such as neurosarcoid. Within limits for assessment on routine brain MR, negative orbits; no evidence for demyelinating process of the anterior optic pathways. Electronically Signed   By: Elsie Stain M.D.   On: 05/27/2016 17:17   Mr Thoracic Spine W Wo Contrast  Result Date: 05/26/2016 EXAM: MRI THORACIC AND LUMBAR SPINE WITHOUT AND WITH CONTRAST TECHNIQUE: Multiplanar and multiecho pulse sequences of the thoracic and lumbar spine were obtained without and with intravenous contrast. CONTRAST:  20mL MULTIHANCE GADOBENATE DIMEGLUMINE 529 MG/ML IV SOLN COMPARISON:  None. FINDINGS: MRI THORACIC SPINE FINDINGS Alignment:  Minimal curvature. Vertebrae: Mild edema within  the right C7 facet possibly degenerative in origin. Cord: Abnormal signal within the cord extends from the lower T3 through the mid T10 level. Additionally, blood breakdown products are noted within the right aspect of the cord extending from the T5-6 level to the lower T9 level (maximal at the T7-8 level). Mild enhancement of the cord T6-7 through upper  T8 level. Paraspinal and other soft tissues: Mild dilation thoracic aorta with ascending thoracic aorta measuring up to 4.2 cm. Disc levels: C6-7: Moderate bulge/ osteophyte with mild spinal stenosis and minimal cord flattening. Axial images not obtained. Scattered minimal bulges throughout the thoracic region without large thoracic disc herniation causing cord compression. MRI LUMBAR SPINE FINDINGS Segmentation:  Last fully open disc space labeled L5-S1. Alignment:  Mild straightening and slight curvature. Vertebrae:  Endplate degenerative changes L4-5. Conus medullaris: Extends to the T12-L1 level. Paraspinal and other soft tissues: Tiny right renal cyst. Disc levels: L1-2:  Negative. L2-3: Mild facet degenerative changes. Minimal bulge greater left lateral position touching but not compressing exiting left L2 nerve root. L3-4: Bulge. Facet degenerative changes. Mild bilateral foraminal narrowing. Multifactorial mild spinal stenosis and lateral recess narrowing bilaterally. L4-5: Facet degenerative changes and ligamentum flavum hypertrophy greater on the right. Disc degeneration with disc space narrowing endplate reactive changes greater on the right. Bulge and osteophyte greater right foraminal/ lateral position encroaching upon exiting right L4 nerve root. Moderate narrowing right lateral thecal sac and right lateral recess. Mild spinal stenosis greater on right. L5-S1: Moderate facet degenerative changes. Bulge with osteophyte. Mild lateral recess narrowing. Mild foraminal narrowing greater on the left. IMPRESSION: MRI THORACIC SPINE Abnormal signal within the thoracic cord extends from the lower T3 through the mid T10 level suggestive of edema. Additionally, blood breakdown products are noted within the right aspect of the cord extending from the T5-6 level to the lower T9 level (maximal at the T7-8 level). Mild enhancement of the cord T6-7 through upper T8 level. Etiology of thoracic cord abnormality is  indeterminate. Considerations include: Hemorrhagic transverse myelitis secondary to viral trigger (infection or vaccination). Patient reports possible right-sided shingles in the past and therefore this may be related to such. Result of myelitis secondary to recent infection (such as influenza) with hemorrhagic conversion or ADEM with hemorrhagic conversion are considerations. Thoracic cord ischemia with hemorrhagic conversion. Given the fact the patient has a slightly dilated ascending thoracic aorta, CT angiogram of the chest abdomen and pelvis may be considered to exclude dissection not detected on the current exam. Vascular malformation such as a cavernoma with bleeding and surrounding edema. Although there is blush like enhancement, appearance is not typical for dural fistula as vessels are not seen along the periphery of the cord. Additionally, symptoms occurred more acutely than expected with dural fistula. Post inflammatory myelitis/vasculitis with bleeding (such as Behcet's disease, sarcoidosis, Wegener's, etc.). Tumor felt last likely consideration (ependymomas can bleed but usually enhance more). Metastatic disease felt less likely unless the patient has a known malignancy with hemorrhagic propensity such as melanoma. Neuromyelitis optica. Multiple sclerosis with hemorrhagic conversion felt less likely consideration. C6-7 moderate bulge/ osteophyte with mild spinal stenosis and minimal cord flattening. Axial images not obtained. Scattered minimal bulges throughout the thoracic region without large thoracic disc herniation causing cord compression. Mild edema within the right C7 facet possibly degenerative in origin. MRI LUMBAR SPINE Multi-level degenerative changes most prominent L4-5 level as detailed above. These results were called by telephone at the time of interpretation on 05/26/2016 at 12:44 pm to Dr.  Linker, who verbally acknowledged these results. Electronically Signed   By: Lacy Duverney M.D.    On: 05/26/2016 13:15   Mr Lumbar Spine W Wo Contrast  Result Date: 05/26/2016 EXAM: MRI THORACIC AND LUMBAR SPINE WITHOUT AND WITH CONTRAST TECHNIQUE: Multiplanar and multiecho pulse sequences of the thoracic and lumbar spine were obtained without and with intravenous contrast. CONTRAST:  20mL MULTIHANCE GADOBENATE DIMEGLUMINE 529 MG/ML IV SOLN COMPARISON:  None. FINDINGS: MRI THORACIC SPINE FINDINGS Alignment:  Minimal curvature. Vertebrae: Mild edema within the right C7 facet possibly degenerative in origin. Cord: Abnormal signal within the cord extends from the lower T3 through the mid T10 level. Additionally, blood breakdown products are noted within the right aspect of the cord extending from the T5-6 level to the lower T9 level (maximal at the T7-8 level). Mild enhancement of the cord T6-7 through upper T8 level. Paraspinal and other soft tissues: Mild dilation thoracic aorta with ascending thoracic aorta measuring up to 4.2 cm. Disc levels: C6-7: Moderate bulge/ osteophyte with mild spinal stenosis and minimal cord flattening. Axial images not obtained. Scattered minimal bulges throughout the thoracic region without large thoracic disc herniation causing cord compression. MRI LUMBAR SPINE FINDINGS Segmentation:  Last fully open disc space labeled L5-S1. Alignment:  Mild straightening and slight curvature. Vertebrae:  Endplate degenerative changes L4-5. Conus medullaris: Extends to the T12-L1 level. Paraspinal and other soft tissues: Tiny right renal cyst. Disc levels: L1-2:  Negative. L2-3: Mild facet degenerative changes. Minimal bulge greater left lateral position touching but not compressing exiting left L2 nerve root. L3-4: Bulge. Facet degenerative changes. Mild bilateral foraminal narrowing. Multifactorial mild spinal stenosis and lateral recess narrowing bilaterally. L4-5: Facet degenerative changes and ligamentum flavum hypertrophy greater on the right. Disc degeneration with disc space  narrowing endplate reactive changes greater on the right. Bulge and osteophyte greater right foraminal/ lateral position encroaching upon exiting right L4 nerve root. Moderate narrowing right lateral thecal sac and right lateral recess. Mild spinal stenosis greater on right. L5-S1: Moderate facet degenerative changes. Bulge with osteophyte. Mild lateral recess narrowing. Mild foraminal narrowing greater on the left. IMPRESSION: MRI THORACIC SPINE Abnormal signal within the thoracic cord extends from the lower T3 through the mid T10 level suggestive of edema. Additionally, blood breakdown products are noted within the right aspect of the cord extending from the T5-6 level to the lower T9 level (maximal at the T7-8 level). Mild enhancement of the cord T6-7 through upper T8 level. Etiology of thoracic cord abnormality is indeterminate. Considerations include: Hemorrhagic transverse myelitis secondary to viral trigger (infection or vaccination). Patient reports possible right-sided shingles in the past and therefore this may be related to such. Result of myelitis secondary to recent infection (such as influenza) with hemorrhagic conversion or ADEM with hemorrhagic conversion are considerations. Thoracic cord ischemia with hemorrhagic conversion. Given the fact the patient has a slightly dilated ascending thoracic aorta, CT angiogram of the chest abdomen and pelvis may be considered to exclude dissection not detected on the current exam. Vascular malformation such as a cavernoma with bleeding and surrounding edema. Although there is blush like enhancement, appearance is not typical for dural fistula as vessels are not seen along the periphery of the cord. Additionally, symptoms occurred more acutely than expected with dural fistula. Post inflammatory myelitis/vasculitis with bleeding (such as Behcet's disease, sarcoidosis, Wegener's, etc.). Tumor felt last likely consideration (ependymomas can bleed but usually enhance  more). Metastatic disease felt less likely unless the patient has a known malignancy with hemorrhagic  propensity such as melanoma. Neuromyelitis optica. Multiple sclerosis with hemorrhagic conversion felt less likely consideration. C6-7 moderate bulge/ osteophyte with mild spinal stenosis and minimal cord flattening. Axial images not obtained. Scattered minimal bulges throughout the thoracic region without large thoracic disc herniation causing cord compression. Mild edema within the right C7 facet possibly degenerative in origin. MRI LUMBAR SPINE Multi-level degenerative changes most prominent L4-5 level as detailed above. These results were called by telephone at the time of interpretation on 05/26/2016 at 12:44 pm to Dr. Karma GanjaLinker, who verbally acknowledged these results. Electronically Signed   By: Lacy DuverneySteven  Olson M.D.   On: 05/26/2016 13:15   [place] Renal  Result Date: 05/27/2016 CLINICAL DATA:  Acute renal failure EXAM: RENAL / URINARY TRACT ULTRASOUND COMPLETE COMPARISON:  None. FINDINGS: Right Kidney: Length: 10.2 cm. Echogenicity within normal limits. No mass or hydronephrosis visualized. Left Kidney: Length: 11.2 cm. Echogenicity within normal limits. No mass or hydronephrosis visualized. Bladder: Decompressed by Foley catheter. IMPRESSION: No evidence for hydronephrosis. Electronically Signed   By: Kennith CenterEric  Mansell M.D.   On: 05/27/2016 17:51   [facility] W Wo Contrast  Result Date: 05/27/2016 CLINICAL DATA:  Thoracic Spinal cord hemorrhagic lesion with edema. Paraplegia. EXAM: MRA SPINE WITHOUT AND WITH CONTRAST TECHNIQUE: Multiplanar and multiecho pulse sequences of the spine were obtained without and with intravenous contrast. Angiographic images of the spine were obtained using MRA technique without and with intravenous contrast. CONTRAST:  See below 20mL MULTIHANCE GADOBENATE DIMEGLUMINE 529 MG/ML IV SOLN COMPARISON:  MRI brain reported separately FINDINGS: There is good opacification of the  spinal arteries and veins. No spinal AVM or fistula is identified. The cord remains enlarged, T2 hyperintense, with heterogeneity similar to 05/26/2016. See differential considerations as outlined on that study. IMPRESSION: No spinal AVM or fistula is identified. Electronically Signed   By: Elsie StainJohn T Curnes M.D.   On: 05/27/2016 16:56   Ct Angio Chest/abd/pel For Dissection W And/or Wo Contrast  Result Date: 05/26/2016 CLINICAL DATA:  Low back pain and abdominal pain. Evidence for thoracic cord ischemia with hemorrhagic conversion on recent MRI. CT angiogram was recommended to exclude an aortic dissection. EXAM: CT ANGIOGRAPHY CHEST, ABDOMEN AND PELVIS TECHNIQUE: Multidetector CT imaging through the chest, abdomen and pelvis was performed using the standard protocol during bolus administration of intravenous contrast. Multiplanar reconstructed images and MIPs were obtained and reviewed to evaluate the vascular anatomy. CONTRAST:  100 mL Isovue 370 COMPARISON:  Thoracic spine MRI 05/26/2016 FINDINGS: CTA CHEST FINDINGS Cardiovascular: The aortic root at the sinuses of Valsalva measures 4.5 cm. The mid ascending thoracic aorta is aneurysmal measuring up to 4.2 cm. 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 The great vessels are patent. Negative for an aortic dissection. Proximal descending thoracic aorta measures 3.2 cm. Pulmonary arteries are patent. Mediastinum/Nodes: No evidence for chest lymphadenopathy. No significant pericardial fluid. Lungs/Pleura: Trachea and mainstem bronchi are patent. Focal pleural thickening or nodularity along the left major fissure on sequence 7, image 80 measuring up to 5 mm. Otherwise, the lungs are clear. No significant airspace disease or consolidation. No pleural effusions. Musculoskeletal: No acute bone abnormality. Review of the MIP images confirms the above findings. CTA ABDOMEN AND PELVIS FINDINGS VASCULAR Aorta: Normal  caliber aorta without aneurysm, dissection, vasculitis or significant stenosis. Celiac: Patent without evidence of aneurysm, dissection, vasculitis or significant stenosis. SMA: Patent without evidence of aneurysm, dissection, vasculitis or significant stenosis. Renals: Both renal arteries are patent without  evidence of aneurysm, dissection, vasculitis, fibromuscular dysplasia or significant stenosis. There is an accessory inferior left renal artery which is patent. IMA: Patent without evidence of aneurysm, dissection, vasculitis or significant stenosis. Inflow: Mild wall calcifications in the iliac arteries without significant stenosis. Internal and external iliac arteries are patent bilaterally. No dissection. Veins: No obvious venous abnormality within the limitations of this arterial phase study. Review of the MIP images confirms the above findings. NON-VASCULAR Hepatobiliary: No acute abnormality to the liver or gallbladder. Pancreas: Normal appearance of the pancreas without inflammation or duct dilatation. Spleen: Normal appearance of spleen without enlargement. Adrenals/Urinary Tract: Normal adrenal glands. Normal appearance of both kidneys without suspicious lesion or hydronephrosis. Urinary bladder is decompressed with a Foley catheter. Stomach/Bowel: There is a large amount of stool in the right colon and hepatic flexure. No evidence for bowel obstruction or focal inflammation. Lymphatic: No significant lymph node enlargement in the abdomen and pelvis. Soft tissue in the mesentery adjacent to loops of bowel on sequence 6, image 257 is nonspecific and may represent a few mesenteric lymph nodes. Reproductive: Prostate is unremarkable. Seminal vesicles are unremarkable. Other: No significant ascites in the abdomen or pelvis. Musculoskeletal: Mild retrolisthesis at L4-L5 with some disc space narrowing. Refer to recent spine MRI. Review of the MIP images confirms the above findings. IMPRESSION: Negative for an  aortic dissection. No acute intrathoracic or intra-abdominal disease. 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 Ascending thoracic aorta measures up to 4.2 cm. Recommend annual imaging followup by CTA or MRA. This recommendation follows 2010 ACCF/AHA/AATS/ACR/ASA/SCA/SCAI/SIR/STS/SVM Guidelines for the Diagnosis and Management of Patients with Thoracic Aortic Disease. Circulation. 2010; 121: [id number] 5 mm nodule along the left major fissure is indeterminate. No follow-up needed if patient is low-risk. Non-contrast chest CT can be considered in 12 months if patient is high-risk. This recommendation follows the consensus statement: Guidelines for Management of Incidental Pulmonary Nodules Detected on CT Images: From the Fleischner Society 2017; Radiology 2017; 284:228-243. Large amount of stool involving the right colon. Electronically Signed   By: Richarda Overlie M.D.   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 Does the need for close, 24 hr/day medical supervision in concert with the patient's rehab needs make it unreasonable for this patient to be served in a less intensive setting? Yes 2. Co-Morbidities requiring supervision/potential complications: AKI, CKD, HTN, management of neurogenic bowel and bladder 3. Due to bladder management, bowel management, safety, skin/wound care, disease management, medication administration, pain management and patient education, does the patient require 24 hr/day rehab nursing? Yes 4. Does the patient require coordinated care of a physician, rehab nurse, PT (1-2 hrs/day, 5 days/week) and OT (1-2 hrs/day, 5 days/week) to address physical and functional deficits in the context of the above medical diagnosis(es)? Yes Addressing deficits in the following areas: balance, endurance, locomotion, strength, transferring, bowel/bladder control, bathing, dressing,  feeding, grooming, toileting and psychosocial support 5. Can the patient actively participate in an intensive therapy program of at least 3 hrs of therapy per day at least 5 days per week? Yes 6. The potential for patient to make measurable gains while on inpatient rehab is excellent 7. Anticipated functional outcomes upon discharge from inpatient rehab are modified independent and supervision  with PT, modified independent and supervision with OT, n/a with SLP. 8. Estimated rehab length of stay to reach the above functional goals is: potentially 15-22 days 9. Does the  patient have adequate social supports and living environment to accommodate these discharge functional goals? Yes and Potentially 10. Anticipated D/C setting: Home 11. Anticipated post D/C treatments: HH therapy and Outpatient therapy 12. Overall Rehab/Functional Prognosis: excellent  RECOMMENDATIONS: This patient's condition is appropriate for continued rehabilitative care in the following setting: CIR Patient has agreed to participate in recommended program. Yes Note that insurance prior authorization may be required for reimbursement for recommended care.  Comment: Rehab Admissions Coordinator to follow up.  Thanks,  [name], MD, [place] [facility]    Charlton Amor., PA-C 05/28/2016

## 2016-06-01 NOTE — Progress Notes (Signed)
Rehab admissions - I received a call from attending MD that patient is medically ready today for acute inpatient rehab admission.  Bed available and will admit to acute inpatient rehab today.  Call me for questions.  #[phone]

## 2016-06-01 NOTE — H&P (Signed)
Physical Medicine and Rehabilitation Admission H&P    Chief Complaint  Patient presents with  . Leg Pain  . Abdominal Pain  . Urinary Frequency  : HPI: Perry Morrison is a 56 y.o. 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 Per chart review patient and patient, patient currently living with a male roommate that works during the day. He is going through a divorce and recently lost his job. Independent prior to admission. Most of his family is in the [place] area. Presented 05/26/2016 with abdominal pain constipation times several days. He was initially seen in the emergency room given some muscle relaxers but then developed right lower extremity weakness. MRI of the thoracic spine, reviewed, demonstrating swelling in the midportion of the cord at T5 level with some right sided lesion consistent with hemorrhage within the cord down to the level of T9. There was some mass effect in the cord itself from the lesion. No spinal AVM or fistula identified. The rest of the cervical spine film was unremarkable. Cranial CT scan negative. MRI of the brain showed no significant white matter signal abnormalities to suggest demyelinating disease. Lumbar puncture completed total protein 81, glucose 56, CSF showed no white cells. 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 Placed on intravenous Solu-Medrol 5 doses and completed . Positive occult stool 05/30/2016 felt to be related to possible hemorrhoids and straining for bowel program with hemoglobin and hematocrit stable. Physical and occupational therapy evaluations completed 05/27/2016 with recommendations of physical medicine rehabilitation consult. Patient was admitted for a comprehensive rehabilitation program  Review of Systems  Constitutional: Negative for chills, fever and weight  loss.  HENT: Negative for ear pain, hearing loss and tinnitus.   Eyes: Negative for blurred vision and double vision.  Respiratory: Negative for cough and shortness of breath.   Cardiovascular: Negative for chest pain, palpitations and leg swelling.  Gastrointestinal: Positive for constipation. Negative for nausea.  Genitourinary: Negative for dysuria and hematuria.       Urinary retention  Musculoskeletal: Positive for myalgias. Negative for falls.  Skin: Negative for rash.  Neurological: Positive for focal weakness and weakness. Negative for sensory change and seizures.  All other systems reviewed and are negative.  Past Medical History:  Diagnosis Date  . AKI (acute kidney injury) ([place])   . 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 Hypertension   . Myelitis ([place])   . Right leg weakness   . Urinary retention    History reviewed. No pertinent surgical history. Family History  Problem Relation Age of Onset  . Hypertension Mother   . AAA (abdominal aortic aneurysm) Mother    Social History:  reports that he has never smoked. He has never used smokeless tobacco. He reports that he does not drink alcohol or use drugs. Allergies: No Known Allergies Medications Prior to Admission  Medication Sig Dispense Refill  . MAGNESIUM PO Take 1 tablet by mouth daily.    . Omega-3 Fatty Acids (OMEGA-3 PO) Take 1 capsule by mouth daily.    Marland Kitchen lidocaine (LMX) 4 % cream Apply 1 application topically 3 (three) times daily as needed. (Patient not taking: Reported on 05/26/2016) 133 g 0  . methocarbamol (ROBAXIN) 500 MG tablet Can take up to 1-2 tabs every 6 hours PRN PAIN (Patient not taking: Reported on 05/26/2016) 20 tablet 0  Home: Home Living Family/patient expects to be discharged to:: Private residence Living Arrangements: Non-relatives/Friends Available Help at Discharge: Friend(s) Type of Home: House Home Access: Stairs to enter Technical brewer of  Steps: 5 Entrance Stairs-Rails: Left Home Layout: Two level, 1/2 bath on main level, Bed/bath upstairs Alternate Level Stairs-Number of Steps: flight Bathroom Shower/Tub: Tub/shower unit, Architectural technologist: Standard Bathroom Accessibility: Yes Home Equipment: None Additional Comments: pt currently living with a roommate. Going through a divorce. 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 Played football at [employer]. Has a 58 yo son who lives with ex wife  Functional Status:  Mobility: Bed Mobility Overal bed mobility: Needs Assistance Bed Mobility: Supine to Sit Supine to sit: Min guard General bed mobility comments: heavy reliance on BUE strength and bedrails. Pt able to actively adduct RLE to move off bed. Also using his arms to move RLE Transfers Overall transfer level: Needs assistance Equipment used: Ambulation equipment used, Rolling walker (2 wheeled) Transfer via Lift Equipment: Stedy Transfers: Sit to/from Stand Sit to Stand: Mod assist, +2 physical assistance General transfer comment: practiced sit to stand multiple times to RW as well as stedy. Needed mod/ max A to stand with hands placed on bed. With stedy, pt able to pull self up with UE's with mod A +2. Min A to help control descent with sitting Ambulation/Gait General Gait Details: unable to ambulate safely    ADL: ADL Overall ADL's : Needs assistance/impaired Grooming: Set up Upper Body Bathing: Set up, Sitting Lower Body Bathing: Moderate assistance, Sitting/lateral leans Upper Body Dressing : Set up, Sitting Lower Body Dressing: Maximal assistance, Sitting/lateral leans Toilet Transfer: +2 for physical assistance, Moderate assistance (sit 0s tand; simulated) Toileting- Clothing Manipulation and Hygiene: Maximal assistance Toileting - Clothing Manipulation  Details (indicate cue type and reason): foley Functional mobility during ADLs: +2 for physical assistance, Moderate assistance, Rolling walker General ADL Comments: Pt unable to take steps due to B knee buckling. Able to stand with +2 mod A although heavy use of his BUE. Pt unable to cross feet over knees in sitting for LB ADL. Began education on compensatory techniques for ADL. Pt expressed relief knowing that someone was going to help him learn how to do self care.   Cognition: Cognition Overall Cognitive Status: Within Functional Limits for tasks assessed Orientation Level: Oriented X4 Cognition Arousal/Alertness: Awake/alert Behavior During Therapy: WFL for tasks assessed/performed Overall Cognitive Status: Within Functional Limits for tasks assessed General Comments: scared about situation, appropriately  Physical Exam: Blood pressure (!) 153/73, pulse 78, temperature 97.4 F (36.3 C), temperature source Oral, resp. rate 20, height _0  (1.854 m), weight 102.1 kg (225 lb), SpO2 97 %. Physical Exam  Constitutional: He appears well-developed and well-nourished.  HENT:  Head: Normocephalic and atraumatic.  Eyes: Conjunctivae and EOM are normal. Left eye exhibits no discharge.  Neck: Normal range of motion. Neck supple. No thyromegaly present.  Cardiovascular: Normal rate, regular rhythm and normal heart sounds.   Respiratory: Effort normal and breath sounds normal. No respiratory distress.  GI: Soft. Bowel sounds are normal. He exhibits no distension.  Genitourinary:  Genitourinary Comments: Genitourinary. Foley tube in place  Musculoskeletal: He exhibits no edema or tenderness.  Neurological:  Neurological: He is alert and oriented to person, place, and time.  Pt alert and appropriate.  B/l UE 5/5.  RLE: 1/5 proximal to distal  LLE: 4-/5 HF, KE and 3/5 ADF/PF.  Sensation intact to light touch throughout DTRs 1+ LLE.   Skin: Skin is warm and dry.   Results for orders placed  or performed during the hospital encounter of 05/26/16 (from the past 48 hour(s))  CSF cell count with differential collection tube #: 1     Status: Abnormal   Collection Time: 05/26/16  1:54 PM  Result Value Ref Range   Tube # 1    Color, CSF COLORLESS COLORLESS   Appearance, CSF CLEAR CLEAR   Supernatant NOT INDICATED    RBC Count, CSF 610 (H) 0 /cu mm   WBC, CSF 1 0 - 5 /cu mm   Other Cells, CSF TOO FEW TO COUNT, SMEAR AVAILABLE FOR REVIEW     Comment: Few lymphs, rare monos.  CSF cell count with differential collection tube #: 4     Status: Abnormal   Collection Time: 05/26/16  1:54 PM  Result Value Ref Range   Tube # 4    Color, CSF COLORLESS COLORLESS   Appearance, CSF CLEAR CLEAR   Supernatant NOT INDICATED    RBC Count, CSF 680 (H) 0 /cu mm   WBC, CSF 2 0 - 5 /cu mm   Other Cells, CSF TOO FEW TO COUNT, SMEAR AVAILABLE FOR REVIEW     Comment: Few lymphs, rare segs & monos.  Glucose, CSF     Status: None   Collection Time: 05/26/16  1:54 PM  Result Value Ref Range   Glucose, CSF 56 40 - 70 mg/dL  Protein, CSF     Status: Abnormal   Collection Time: 05/26/16  1:54 PM  Result Value Ref Range   Total  Protein, CSF 81 (H) 15 - 45 mg/dL  CSF culture     Status: None (Preliminary result)   Collection Time: 05/26/16  4:00 PM  Result Value Ref Range   Specimen Description CSF    Special Requests NONE    Gram Stain      CYTOSPIN SMEAR WBC PRESENT,BOTH PMN AND MONONUCLEAR RBC PRESENT NO ORGANISMS SEEN    Culture NO GROWTH < 24 HOURS    Report Status PENDING   Basic metabolic panel     Status: Abnormal   Collection Time: 05/27/16  3:48 AM  Result Value Ref Range   Sodium 135 135 - 145 mmol/L   Potassium 5.1 3.5 - 5.1 mmol/L   Chloride 98 (L) 101 - 111 mmol/L   CO2 23 22 - 32 mmol/L   Glucose, Bld 133 (H) 65 - 99 mg/dL   BUN 16 6 - 20 mg/dL   Creatinine, Ser 1.41 (H) 0.61 - 1.24 mg/dL   Calcium 10.1 8.9 - 10.3 mg/dL   GFR calc non Af Amer 55 (L) >60 mL/min   GFR calc  Af Amer >60 >60 mL/min    Comment: (NOTE) The eGFR has been calculated using the CKD EPI equation. This calculation has not been validated in all clinical situations. eGFR's persistently <60 mL/min signify possible Chronic Kidney Disease.    Anion gap 14 5 - 15  CBC     Status: None   Collection Time: 05/27/16  3:48 AM  Result Value Ref Range   WBC 5.8 4.0 - 10.5 K/uL   RBC 5.12 4.22 - 5.81 MIL/uL   Hemoglobin 15.0 13.0 - 17.0 g/dL   HCT 43.9 39.0 - 52.0 %   MCV 85.7 78.0 - 100.0 fL   MCH 29.3 26.0 - 34.0 pg  MCHC 34.2 30.0 - 36.0 g/dL   RDW 12.9 11.5 - 15.5 %   Platelets 254 150 - 400 K/uL  Basic metabolic panel     Status: Abnormal   Collection Time: 05/28/16  5:24 AM  Result Value Ref Range   Sodium 135 135 - 145 mmol/L   Potassium 4.7 3.5 - 5.1 mmol/L   Chloride 102 101 - 111 mmol/L   CO2 24 22 - 32 mmol/L   Glucose, Bld 156 (H) 65 - 99 mg/dL   BUN 22 (H) 6 - 20 mg/dL   Creatinine, Ser 1.29 (H) 0.61 - 1.24 mg/dL   Calcium 9.8 8.9 - 10.3 mg/dL   GFR calc non Af Amer >60 >60 mL/min   GFR calc Af Amer >60 >60 mL/min    Comment: (NOTE) The eGFR has been calculated using the CKD EPI equation. This calculation has not been validated in all clinical situations. eGFR's persistently <60 mL/min signify possible Chronic Kidney Disease.    Anion gap 9 5 - 15  CBC     Status: None   Collection Time: 05/28/16  5:24 AM  Result Value Ref Range   WBC 8.6 4.0 - 10.5 K/uL   RBC 4.97 4.22 - 5.81 MIL/uL   Hemoglobin 14.7 13.0 - 17.0 g/dL   HCT 42.7 39.0 - 52.0 %   MCV 85.9 78.0 - 100.0 fL   MCH 29.6 26.0 - 34.0 pg   MCHC 34.4 30.0 - 36.0 g/dL   RDW 13.1 11.5 - 15.5 %   Platelets 262 150 - 400 K/uL   Ct Head Wo Contrast  Result Date: 05/26/2016 CLINICAL DATA:  Right leg numbness with decreased movement of right foot. EXAM: CT HEAD WITHOUT CONTRAST TECHNIQUE: Contiguous axial images were obtained from the base of the skull through the vertex without intravenous contrast.  COMPARISON:  None. FINDINGS: Brain: No evidence of acute infarction, hemorrhage, hydrocephalus, extra-axial collection or mass lesion/mass effect. Vascular: No hyperdense vessel or unexpected calcification. Skull: Normal. Negative for fracture or focal lesion. Sinuses/Orbits: No acute finding. Other: None. IMPRESSION: Normal head CT. Electronically Signed   By: Aletta Edouard M.D.   On: 05/26/2016 15:21   Mr Jeri Cos QB Contrast  Result Date: 05/27/2016 CLINICAL DATA:  Evaluate for intracranial white matter abnormalities in a patient with paraplegia. EXAM: MRI HEAD WITHOUT AND WITH CONTRAST TECHNIQUE: Multiplanar, multiecho pulse sequences of the brain and surrounding structures were obtained without and with intravenous contrast. CONTRAST:  32m MULTIHANCE GADOBENATE DIMEGLUMINE 529 MG/ML IV SOLN COMPARISON:  MRI thoracic spine 05/26/2016. FINDINGS: Brain: No evidence for acute infarction, hemorrhage, mass lesion, hydrocephalus, or extra-axial fluid. Normal for age cerebral volume. No significant white matter signal abnormality to suggest demyelinating disease, vasculitis, or inflammatory/infectious process. Post infusion, no abnormal enhancement of the brain or meninges. Vascular: Flow voids are maintained throughout the carotid, basilar, and vertebral arteries. There are no areas of chronic hemorrhage. Skull and upper cervical spine: Unremarkable visualized calvarium, skullbase, and cervical vertebrae. Pituitary, pineal, cerebellar tonsils unremarkable. No upper cervical cord lesions. Sinuses/Orbits: No orbital masses or proptosis. Globes appear symmetric. Sinuses appear well aerated, without evidence for air-fluid level. Within limits for assessment on routine brain MR, normal-appearing optic nerves, optic chiasm, and tracts. Other: No nasopharyngeal pathology or mastoid fluid. Scalp and other visualized extracranial soft tissues grossly unremarkable. IMPRESSION: No significant white matter signal  abnormalities to suggest demyelinating disease, vasculitis, or infectious/inflammatory process. No abnormal postcontrast enhancement to suggest a granulomatous process such as neurosarcoid. Within  limits for assessment on routine brain MR, negative orbits; no evidence for demyelinating process of the anterior optic pathways. Electronically Signed   By: Staci Righter M.D.   On: 05/27/2016 17:17   Mr Thoracic Spine W Wo Contrast  Result Date: 05/26/2016 EXAM: MRI THORACIC AND LUMBAR SPINE WITHOUT AND WITH CONTRAST TECHNIQUE: Multiplanar and multiecho pulse sequences of the thoracic and lumbar spine were obtained without and with intravenous contrast. CONTRAST:  64m MULTIHANCE GADOBENATE DIMEGLUMINE 529 MG/ML IV SOLN COMPARISON:  None. FINDINGS: MRI THORACIC SPINE FINDINGS Alignment:  Minimal curvature. Vertebrae: Mild edema within the right C7 facet possibly degenerative in origin. Cord: Abnormal signal within the cord extends from the lower T3 through the mid T10 level. Additionally, blood breakdown products are noted within the right aspect of the cord extending from the T5-6 level to the lower T9 level (maximal at the T7-8 level). Mild enhancement of the cord T6-7 through upper T8 level. Paraspinal and other soft tissues: Mild dilation thoracic aorta with ascending thoracic aorta measuring up to 4.2 cm. Disc levels: C6-7: Moderate bulge/ osteophyte with mild spinal stenosis and minimal cord flattening. Axial images not obtained. Scattered minimal bulges throughout the thoracic region without large thoracic disc herniation causing cord compression. MRI LUMBAR SPINE FINDINGS Segmentation:  Last fully open disc space labeled L5-S1. Alignment:  Mild straightening and slight curvature. Vertebrae:  Endplate degenerative changes L4-5. Conus medullaris: Extends to the T12-L1 level. Paraspinal and other soft tissues: Tiny right renal cyst. Disc levels: L1-2:  Negative. L2-3: Mild facet degenerative changes. Minimal  bulge greater left lateral position touching but not compressing exiting left L2 nerve root. L3-4: Bulge. Facet degenerative changes. Mild bilateral foraminal narrowing. Multifactorial mild spinal stenosis and lateral recess narrowing bilaterally. L4-5: Facet degenerative changes and ligamentum flavum hypertrophy greater on the right. Disc degeneration with disc space narrowing endplate reactive changes greater on the right. Bulge and osteophyte greater right foraminal/ lateral position encroaching upon exiting right L4 nerve root. Moderate narrowing right lateral thecal sac and right lateral recess. Mild spinal stenosis greater on right. L5-S1: Moderate facet degenerative changes. Bulge with osteophyte. Mild lateral recess narrowing. Mild foraminal narrowing greater on the left. IMPRESSION: MRI THORACIC SPINE Abnormal signal within the thoracic cord extends from the lower T3 through the mid T10 level suggestive of edema. Additionally, blood breakdown products are noted within the right aspect of the cord extending from the T5-6 level to the lower T9 level (maximal at the T7-8 level). Mild enhancement of the cord T6-7 through upper T8 level. Etiology of thoracic cord abnormality is indeterminate. Considerations include: Hemorrhagic transverse myelitis secondary to viral trigger (infection or vaccination). Patient reports possible right-sided shingles in the past and therefore this may be related to such. Result of myelitis secondary to recent infection (such as influenza) with hemorrhagic conversion or ADEM with hemorrhagic conversion are considerations. Thoracic cord ischemia with hemorrhagic conversion. Given the fact the patient has a slightly dilated ascending thoracic aorta, CT angiogram of the chest abdomen and pelvis may be considered to exclude dissection not detected on the current exam. Vascular malformation such as a cavernoma with bleeding and surrounding edema. Although there is blush like enhancement,  appearance is not typical for dural fistula as vessels are not seen along the periphery of the cord. Additionally, symptoms occurred more acutely than expected with dural fistula. Post inflammatory myelitis/vasculitis with bleeding (such as Behcet's disease, sarcoidosis, Wegener's, etc.). Tumor felt last likely consideration (ependymomas can bleed but usually enhance more). Metastatic disease  felt less likely unless the patient has a known malignancy with hemorrhagic propensity such as melanoma. Neuromyelitis optica. Multiple sclerosis with hemorrhagic conversion felt less likely consideration. C6-7 moderate bulge/ osteophyte with mild spinal stenosis and minimal cord flattening. Axial images not obtained. Scattered minimal bulges throughout the thoracic region without large thoracic disc herniation causing cord compression. Mild edema within the right C7 facet possibly degenerative in origin. MRI LUMBAR SPINE Multi-level degenerative changes most prominent L4-5 level as detailed above. These results were called by telephone at the time of interpretation on 05/26/2016 at 12:44 pm to Dr. Canary Brim, who verbally acknowledged these results. Electronically Signed   By: Genia Del M.D.   On: 05/26/2016 13:15   Mr Lumbar Spine W Wo Contrast  Result Date: 05/26/2016 EXAM: MRI THORACIC AND LUMBAR SPINE WITHOUT AND WITH CONTRAST TECHNIQUE: Multiplanar and multiecho pulse sequences of the thoracic and lumbar spine were obtained without and with intravenous contrast. CONTRAST:  73m MULTIHANCE GADOBENATE DIMEGLUMINE 529 MG/ML IV SOLN COMPARISON:  None. FINDINGS: MRI THORACIC SPINE FINDINGS Alignment:  Minimal curvature. Vertebrae: Mild edema within the right C7 facet possibly degenerative in origin. Cord: Abnormal signal within the cord extends from the lower T3 through the mid T10 level. Additionally, blood breakdown products are noted within the right aspect of the cord extending from the T5-6 level to the lower T9 level  (maximal at the T7-8 level). Mild enhancement of the cord T6-7 through upper T8 level. Paraspinal and other soft tissues: Mild dilation thoracic aorta with ascending thoracic aorta measuring up to 4.2 cm. Disc levels: C6-7: Moderate bulge/ osteophyte with mild spinal stenosis and minimal cord flattening. Axial images not obtained. Scattered minimal bulges throughout the thoracic region without large thoracic disc herniation causing cord compression. MRI LUMBAR SPINE FINDINGS Segmentation:  Last fully open disc space labeled L5-S1. Alignment:  Mild straightening and slight curvature. Vertebrae:  Endplate degenerative changes L4-5. Conus medullaris: Extends to the T12-L1 level. Paraspinal and other soft tissues: Tiny right renal cyst. Disc levels: L1-2:  Negative. L2-3: Mild facet degenerative changes. Minimal bulge greater left lateral position touching but not compressing exiting left L2 nerve root. L3-4: Bulge. Facet degenerative changes. Mild bilateral foraminal narrowing. Multifactorial mild spinal stenosis and lateral recess narrowing bilaterally. L4-5: Facet degenerative changes and ligamentum flavum hypertrophy greater on the right. Disc degeneration with disc space narrowing endplate reactive changes greater on the right. Bulge and osteophyte greater right foraminal/ lateral position encroaching upon exiting right L4 nerve root. Moderate narrowing right lateral thecal sac and right lateral recess. Mild spinal stenosis greater on right. L5-S1: Moderate facet degenerative changes. Bulge with osteophyte. Mild lateral recess narrowing. Mild foraminal narrowing greater on the left. IMPRESSION: MRI THORACIC SPINE Abnormal signal within the thoracic cord extends from the lower T3 through the mid T10 level suggestive of edema. Additionally, blood breakdown products are noted within the right aspect of the cord extending from the T5-6 level to the lower T9 level (maximal at the T7-8 level). Mild enhancement of the  cord T6-7 through upper T8 level. Etiology of thoracic cord abnormality is indeterminate. Considerations include: Hemorrhagic transverse myelitis secondary to viral trigger (infection or vaccination). Patient reports possible right-sided shingles in the past and therefore this may be related to such. Result of myelitis secondary to recent infection (such as influenza) with hemorrhagic conversion or ADEM with hemorrhagic conversion are considerations. Thoracic cord ischemia with hemorrhagic conversion. Given the fact the patient has a slightly dilated ascending thoracic aorta, CT angiogram of the  chest abdomen and pelvis may be considered to exclude dissection not detected on the current exam. Vascular malformation such as a cavernoma with bleeding and surrounding edema. Although there is blush like enhancement, appearance is not typical for dural fistula as vessels are not seen along the periphery of the cord. Additionally, symptoms occurred more acutely than expected with dural fistula. Post inflammatory myelitis/vasculitis with bleeding (such as Behcet's disease, sarcoidosis, Wegener's, etc.). Tumor felt last likely consideration (ependymomas can bleed but usually enhance more). Metastatic disease felt less likely unless the patient has a known malignancy with hemorrhagic propensity such as melanoma. Neuromyelitis optica. Multiple sclerosis with hemorrhagic conversion felt less likely consideration. C6-7 moderate bulge/ osteophyte with mild spinal stenosis and minimal cord flattening. Axial images not obtained. Scattered minimal bulges throughout the thoracic region without large thoracic disc herniation causing cord compression. Mild edema within the right C7 facet possibly degenerative in origin. MRI LUMBAR SPINE Multi-level degenerative changes most prominent L4-5 level as detailed above. These results were called by telephone at the time of interpretation on 05/26/2016 at 12:44 pm to Dr. Canary Brim, who verbally  acknowledged these results. Electronically Signed   By: Genia Del M.D.   On: 05/26/2016 13:15   US Renal  Result Date: 05/27/2016 CLINICAL DATA:  Acute renal failure EXAM: RENAL / URINARY TRACT ULTRASOUND COMPLETE COMPARISON:  None. FINDINGS: Right Kidney: Length: 10.2 cm. Echogenicity within normal limits. No mass or hydronephrosis visualized. Left Kidney: Length: 11.2 cm. Echogenicity within normal limits. No mass or hydronephrosis visualized. Bladder: Decompressed by Foley catheter. IMPRESSION: No evidence for hydronephrosis. Electronically Signed   By: Misty Stanley M.D.   On: 05/27/2016 17:51   Mr [facility] W Wo Contrast  Result Date: 05/27/2016 CLINICAL DATA:  Thoracic Spinal cord hemorrhagic lesion with edema. Paraplegia. EXAM: MRA SPINE WITHOUT AND WITH CONTRAST TECHNIQUE: Multiplanar and multiecho pulse sequences of the spine were obtained without and with intravenous contrast. Angiographic images of the spine were obtained using MRA technique without and with intravenous contrast. CONTRAST:  See below 6m MULTIHANCE GADOBENATE DIMEGLUMINE 529 MG/ML IV SOLN COMPARISON:  MRI brain reported separately FINDINGS: There is good opacification of the spinal arteries and veins. No spinal AVM or fistula is identified. The cord remains enlarged, T2 hyperintense, with heterogeneity similar to 05/26/2016. See differential considerations as outlined on that study. IMPRESSION: No spinal AVM or fistula is identified. Electronically Signed   By: JStaci RighterM.D.   On: 05/27/2016 16:56   Ct Angio Chest/abd/pel For Dissection W And/or Wo Contrast  Result Date: 05/26/2016 CLINICAL DATA:  Low back pain and abdominal pain. Evidence for thoracic cord ischemia with hemorrhagic conversion on recent MRI. CT angiogram was recommended to exclude an aortic dissection. EXAM: CT ANGIOGRAPHY CHEST, ABDOMEN AND PELVIS TECHNIQUE: Multidetector CT imaging through the chest, abdomen and pelvis was performed using  the standard protocol during bolus administration of intravenous contrast. Multiplanar reconstructed images and MIPs were obtained and reviewed to evaluate the vascular anatomy. CONTRAST:  100 mL Isovue 370 COMPARISON:  Thoracic spine MRI 05/26/2016 FINDINGS: CTA CHEST FINDINGS Cardiovascular: The aortic root at the sinuses of Valsalva measures 4.5 cm. The mid ascending thoracic aorta is aneurysmal measuring up to 4.2 cm. 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 The great vessels are patent. Negative for an aortic dissection. Proximal descending thoracic aorta measures 3.2 cm. Pulmonary arteries are patent. Mediastinum/Nodes: No evidence for chest lymphadenopathy. No significant pericardial fluid. Lungs/Pleura: Trachea  and mainstem bronchi are patent. Focal pleural thickening or nodularity along the left major fissure on sequence 7, image 80 measuring up to 5 mm. Otherwise, the lungs are clear. No significant airspace disease or consolidation. No pleural effusions. Musculoskeletal: No acute bone abnormality. Review of the MIP images confirms the above findings. CTA ABDOMEN AND PELVIS FINDINGS VASCULAR Aorta: Normal caliber aorta without aneurysm, dissection, vasculitis or significant stenosis. Celiac: Patent without evidence of aneurysm, dissection, vasculitis or significant stenosis. SMA: Patent without evidence of aneurysm, dissection, vasculitis or significant stenosis. Renals: Both renal arteries are patent without evidence of aneurysm, dissection, vasculitis, fibromuscular dysplasia or significant stenosis. There is an accessory inferior left renal artery which is patent. IMA: Patent without evidence of aneurysm, dissection, vasculitis or significant stenosis. Inflow: Mild wall calcifications in the iliac arteries without significant stenosis. Internal and external iliac arteries are patent bilaterally. No dissection. Veins: No obvious venous abnormality  within the limitations of this arterial phase study. Review of the MIP images confirms the above findings. NON-VASCULAR Hepatobiliary: No acute abnormality to the liver or gallbladder. Pancreas: Normal appearance of the pancreas without inflammation or duct dilatation. Spleen: Normal appearance of spleen without enlargement. Adrenals/Urinary Tract: Normal adrenal glands. Normal appearance of both kidneys without suspicious lesion or hydronephrosis. Urinary bladder is decompressed with a Foley catheter. Stomach/Bowel: There is a large amount of stool in the right colon and hepatic flexure. No evidence for bowel obstruction or focal inflammation. Lymphatic: No significant lymph node enlargement in the abdomen and pelvis. Soft tissue in the mesentery adjacent to loops of bowel on sequence 6, image 257 is nonspecific and may represent a few mesenteric lymph nodes. Reproductive: Prostate is unremarkable. Seminal vesicles are unremarkable. Other: No significant ascites in the abdomen or pelvis. Musculoskeletal: Mild retrolisthesis at L4-L5 with some disc space narrowing. Refer to recent spine MRI. Review of the MIP images confirms the above findings. IMPRESSION: Negative for an aortic dissection. No acute intrathoracic or intra-abdominal disease. 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 Ascending thoracic aorta measures up to 4.2 cm. Recommend annual imaging followup by CTA or MRA. This recommendation follows 2010 ACCF/AHA/AATS/ACR/ASA/SCA/SCAI/SIR/STS/SVM Guidelines for the Diagnosis and Management of Patients with Thoracic Aortic Disease. Circulation. 2010; 121: e266-e369 5 mm nodule along the left major fissure is indeterminate. No follow-up needed if patient is low-risk. Non-contrast chest CT can be considered in 12 months if patient is high-risk. This recommendation follows the consensus statement: Guidelines for Management of Incidental Pulmonary Nodules Detected on CT Images: From the Fleischner  Society 2017; Radiology 2017; 284:228-243. Large amount of stool involving the right colon. Electronically Signed   By: Markus Daft M.D.   On: 05/26/2016 15:24    Medical Problem List and Plan: 1.  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   IV Solu-Medrol completed and transition to prednisone 60 mg daily 2 doses and stop 2.  DVT Prophylaxis/Anticoagulation: SCDs. Check vascular study 3. Pain Management: Hydrocodone as needed 4. Hypertension. Norvasc 10 mg daily Monitor when out of bed 5. Neuropsych: This patient is capable of making decisions on his own behalf. 6. Skin/Wound Care: Routine skin checks 7. Fluids/Electrolytes/Nutrition: Routine I&O with follow-up chemistries 8.CRI stage III. Follow-up chemistries 9. Suspect neurogenic bowel bladder. Consider discontinuation of Foley tube for voiding trial. Adjust bowel program.  Post Admission Physician Evaluation: 1. Preadmission assessment reviewed and changes made below. 2. 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 3. Patient is admitted  to receive collaborative, interdisciplinary care between the physiatrist, rehab nursing staff, and therapy team. 4. Patient's level of medical complexity and substantial therapy needs in context of that medical necessity cannot be provided at a lesser intensity of care such as a SNF. 5. Patient has experienced substantial functional loss from his/her baseline which was documented above under the "Functional History" and "Functional Status" headings.  Judging by the patient's diagnosis, physical exam, and functional history, the patient has potential for functional progress which will result in measurable gains while on inpatient rehab.  These gains will be of substantial and practical use upon discharge  in facilitating mobility and self-care at the household  level. 6. Physiatrist will provide 24 hour management of medical needs as well as oversight of the therapy plan/treatment and provide guidance as appropriate regarding the interaction of the two. 7. The Preadmission Screening has been reviewed and patient status is unchanged unless otherwise stated above. 8. 24 hour rehab nursing will assist with bladder management, safety, disease management, pain management and patient education  and help integrate therapy concepts, techniques,education, etc. 9. PT will assess and treat for/with: Lower extremity strength, range of motion, stamina, balance, functional mobility, safety, adaptive techniques and equipment, coping skills, pain control, SCI education.   Goals are: Min A. 10. OT will assess and treat for/with: ADL's, functional mobility, safety, upper extremity strength, adaptive techniques and equipment, wound mgt, ego support, and community reintegration.   Goals are: Min/Mod A. Therapy may proceed with showering this patient. 11. Case Management and Social Worker will assess and treat for psychological issues and discharge planning. 12. Team conference will be held weekly to assess progress toward goals and to determine barriers to discharge. 13. Patient will receive at least 3 hours of therapy per day at least 5 days per week. 14. ELOS: 18-22 days.       15. Prognosis:  good   Delice Lesch, MD, Mellody Drown Cathlyn Parsons., PA-C 05/28/2016

## 2016-06-01 NOTE — Progress Notes (Signed)
[name], MD Physician Signed Physical Medicine and Rehabilitation  Consult Note Date of Service: 05/28/2016 8:29 AM  Related encounter: ED to Hosp-Admission (Current) from 05/26/2016 in [facility] 75M NEURO MEDICAL     Expand All Collapse All   [] Hide copied text [] Hover for attribution information      Physical Medicine and Rehabilitation Consult Reason for Consult: Spinal cord lesion Referring Physician: [facility]   HPI: Perry Morrison is a 56 y.o. 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 Per chart review patient currently living with a male roommate that works during the day. He is going through a divorce and recently lost his job. Independent prior to admission. Most of his family is in the [place] area. Presented 05/26/2016 with abdominal pain constipation times several days. He was initially seen in the emergency room given some muscle relaxers but then developed right lower extremity weakness. An MRI of the thoracic spine demonstrated swelling in the midportion of the cord at T5 level with some right sided lesion consistent with hemorrhage within the cord down to the level of T9. There was some mass effect in the cord itself from the lesion. The rest of the cervical spine film was unremarkable. Cranial CT scan negative. MRI of the brain showed no significant white matter signal abnormalities to suggest demyelinating disease. Lumbar puncture completed total protein 81, glucose 56, CSF showed no white cells. Neurosurgery as well as neurology consulted etiology suggestive of spinal cord infarct versus vascular malformation. Presently maintained on intravenous Solu-Medrol with workup ongoing. Physical therapy evaluation completed 05/27/2016 with recommendations of physical medicine rehabilitation consult.   Review of Systems  Constitutional: Negative for chills and fever.  HENT: Negative for hearing loss and tinnitus.     Eyes: Negative for blurred vision and double vision.  Respiratory: Negative for cough and shortness of breath.   Cardiovascular: Negative for chest pain, palpitations and leg swelling.  Gastrointestinal: Positive for abdominal pain and constipation.  Genitourinary:       Urinary retention  Musculoskeletal: Positive for myalgias. Negative for falls.  Skin: Negative for rash.  Neurological: Positive for weakness. Negative for seizures.  All other systems reviewed and are negative.      Past Medical History:  Diagnosis Date  . AKI (acute kidney injury) (HCC)   . 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 Hypertension   . Myelitis (HCC)   . Right leg weakness   . Urinary retention    History reviewed. No pertinent surgical history.      Family History  Problem Relation Age of Onset  . Hypertension Mother   . AAA (abdominal aortic aneurysm) Mother    Social History:  reports that he has never smoked. He has never used smokeless tobacco. He reports that he does not drink alcohol or use drugs. Allergies: No Known Allergies       Medications Prior to Admission  Medication Sig Dispense Refill  . MAGNESIUM PO Take 1 tablet by mouth daily.    . Omega-3 Fatty Acids (OMEGA-3 PO) Take 1 capsule by mouth daily.    Marland Kitchen lidocaine (LMX) 4 % cream Apply 1 application topically 3 (three) times daily as needed. (Patient not taking: Reported on 05/26/2016) 133 g 0  . methocarbamol (ROBAXIN) 500 MG tablet Can take up to 1-2 tabs every 6 hours PRN PAIN (Patient not taking: Reported on 05/26/2016) 20 tablet 0    Home: Home  Living Family/patient expects to be discharged to:: Private residence Living Arrangements: Non-relatives/Friends Available Help at Discharge: Friend(s) Type of Home: House Home Access: Stairs to enter [employer] of Steps: 5 Entrance Stairs-Rails: Left Home Layout: Two level, 1/2 bath on main level, Bed/bath  upstairs Alternate Level Stairs-Number of Steps: flight Bathroom Shower/Tub: Tub/shower unit, Engineer, building services: Standard Bathroom Accessibility: Yes Home Equipment: None Additional Comments: pt currently living with a roommate. Going through a divorce. 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 Played football at [employer]. Has a 36 yo son who lives with ex wife Functional Status:  Mobility: Bed Mobility Overal bed mobility: Needs Assistance Bed Mobility: Supine to Sit Supine to sit: Min guard General bed mobility comments: heavy reliance on BUE strength and bedrails. Pt able to actively adduct RLE to move off bed. Also using his arms to move RLE Transfers Overall transfer level: Needs assistance Equipment used: Ambulation equipment used, Rolling walker (2 wheeled) Transfer via Lift Equipment: Stedy Transfers: Sit to/from Stand Sit to Stand: Mod assist, +2 physical assistance General transfer comment: practiced sit to stand multiple times to RW as well as stedy. Needed mod/ max A to stand with hands placed on bed. With stedy, pt able to pull self up with UE's with mod A +2. Min A to help control descent with sitting Ambulation/Gait General Gait Details: unable to ambulate safely  ADL: ADL Overall ADL's : Needs assistance/impaired Grooming: Set up Upper Body Bathing: Set up, Sitting Lower Body Bathing: Moderate assistance, Sitting/lateral leans Upper Body Dressing : Set up, Sitting Lower Body Dressing: Maximal assistance, Sitting/lateral leans Toilet Transfer: +2 for physical assistance, Moderate assistance (sit 0s tand; simulated) Toileting- Clothing Manipulation and Hygiene: Maximal assistance Toileting - Clothing Manipulation Details (indicate cue type and reason): foley Functional mobility during ADLs: +2 for physical  assistance, Moderate assistance, Rolling walker General ADL Comments: Pt unable to take steps due to B knee buckling. Able to stand with +2 mod A although heavy use of his BUE. Pt unable to cross feet over knees in sitting for LB ADL. Began education on compensatory techniques for ADL. Pt expressed relief knowing that someone was going to help him learn how to do self care.   Cognition: Cognition Overall Cognitive Status: Within Functional Limits for tasks assessed Orientation Level: Oriented X4 Cognition Arousal/Alertness: Awake/alert Behavior During Therapy: WFL for tasks assessed/performed Overall Cognitive Status: Within Functional Limits for tasks assessed General Comments: scared about situation, appropriately  Blood pressure (!) 153/73, pulse 78, temperature 97.4 F (36.3 C), temperature source Oral, resp. rate 20, height 6\' 1"  (1.854 m), weight 102.1 kg (225 lb), SpO2 97 %. Physical Exam  Constitutional: He is oriented to person, place, and time. He appears well-developed.  HENT:  Head: Normocephalic.  Eyes: EOM are normal.  Neck: Normal range of motion. Neck supple. No thyromegaly present.  Cardiovascular: Normal rate, regular rhythm and normal heart sounds.   Respiratory: Effort normal and breath sounds normal. No respiratory distress.  GI: Soft. Bowel sounds are normal. He exhibits no distension.  Genitourinary:  Genitourinary Comments: Foley in  Neurological: He is alert and oriented to person, place, and time.  Pt alert and appropriate. UE 5/5. RLE: 0 to ?trace /5 HF,KE ADF/PF. LLE: 3- to 3/5 HF, KE and 2- to 2/5 ADF/PF. Senses pain in both lower extremities but appears to have decreased proprioception. DTRs 1+. No  resting tone evdient  Skin: Skin is warm and dry.    Lab Results Last 24 Hours       Results for orders placed or performed during the hospital encounter of 05/26/16 (from the past 24 hour(s))  Basic metabolic panel     Status: Abnormal   Collection  Time: 05/28/16  5:24 AM  Result Value Ref Range   Sodium 135 135 - 145 mmol/L   Potassium 4.7 3.5 - 5.1 mmol/L   Chloride 102 101 - 111 mmol/L   CO2 24 22 - 32 mmol/L   Glucose, Bld 156 (H) 65 - 99 mg/dL   BUN 22 (H) 6 - 20 mg/dL   Creatinine, Ser 1.19 (H) 0.61 - 1.24 mg/dL   Calcium 9.8 8.9 - 14.7 mg/dL   GFR calc non Af Amer >60 >60 mL/min   GFR calc Af Amer >60 >60 mL/min   Anion gap 9 5 - 15      Imaging Results (Last 48 hours)  Ct Head Wo Contrast  Result Date: 05/26/2016 CLINICAL DATA:  Right leg numbness with decreased movement of right foot. EXAM: CT HEAD WITHOUT CONTRAST TECHNIQUE: Contiguous axial images were obtained from the base of the skull through the vertex without intravenous contrast. COMPARISON:  None. FINDINGS: Brain: No evidence of acute infarction, hemorrhage, hydrocephalus, extra-axial collection or mass lesion/mass effect. Vascular: No hyperdense vessel or unexpected calcification. Skull: Normal. Negative for fracture or focal lesion. Sinuses/Orbits: No acute finding. Other: None. IMPRESSION: Normal head CT. Electronically Signed   By: Irish Lack M.D.   On: 05/26/2016 15:21   Mr Laqueta Jean WG Contrast  Result Date: 05/27/2016 CLINICAL DATA:  Evaluate for intracranial white matter abnormalities in a patient with paraplegia. EXAM: MRI HEAD WITHOUT AND WITH CONTRAST TECHNIQUE: Multiplanar, multiecho pulse sequences of the brain and surrounding structures were obtained without and with intravenous contrast. CONTRAST:  20mL MULTIHANCE GADOBENATE DIMEGLUMINE 529 MG/ML IV SOLN COMPARISON:  MRI thoracic spine 05/26/2016. FINDINGS: Brain: No evidence for acute infarction, hemorrhage, mass lesion, hydrocephalus, or extra-axial fluid. Normal for age cerebral volume. No significant white matter signal abnormality to suggest demyelinating disease, vasculitis, or inflammatory/infectious process. Post infusion, no abnormal enhancement of the brain or meninges.  Vascular: Flow voids are maintained throughout the carotid, basilar, and vertebral arteries. There are no areas of chronic hemorrhage. Skull and upper cervical spine: Unremarkable visualized calvarium, skullbase, and cervical vertebrae. Pituitary, pineal, cerebellar tonsils unremarkable. No upper cervical cord lesions. Sinuses/Orbits: No orbital masses or proptosis. Globes appear symmetric. Sinuses appear well aerated, without evidence for air-fluid level. Within limits for assessment on routine brain MR, normal-appearing optic nerves, optic chiasm, and tracts. Other: No nasopharyngeal pathology or mastoid fluid. Scalp and other visualized extracranial soft tissues grossly unremarkable. IMPRESSION: No significant white matter signal abnormalities to suggest demyelinating disease, vasculitis, or infectious/inflammatory process. No abnormal postcontrast enhancement to suggest a granulomatous process such as neurosarcoid. Within limits for assessment on routine brain MR, negative orbits; no evidence for demyelinating process of the anterior optic pathways. Electronically Signed   By: Elsie Stain M.D.   On: 05/27/2016 17:17   Mr Thoracic Spine W Wo Contrast  Result Date: 05/26/2016 EXAM: MRI THORACIC AND LUMBAR SPINE WITHOUT AND WITH CONTRAST TECHNIQUE: Multiplanar and multiecho pulse sequences of the thoracic and lumbar spine were obtained without and with intravenous contrast. CONTRAST:  20mL MULTIHANCE GADOBENATE DIMEGLUMINE 529 MG/ML IV SOLN COMPARISON:  None. FINDINGS: MRI THORACIC SPINE FINDINGS Alignment:  Minimal curvature. Vertebrae: Mild edema within  the right C7 facet possibly degenerative in origin. Cord: Abnormal signal within the cord extends from the lower T3 through the mid T10 level. Additionally, blood breakdown products are noted within the right aspect of the cord extending from the T5-6 level to the lower T9 level (maximal at the T7-8 level). Mild enhancement of the cord T6-7 through upper  T8 level. Paraspinal and other soft tissues: Mild dilation thoracic aorta with ascending thoracic aorta measuring up to 4.2 cm. Disc levels: C6-7: Moderate bulge/ osteophyte with mild spinal stenosis and minimal cord flattening. Axial images not obtained. Scattered minimal bulges throughout the thoracic region without large thoracic disc herniation causing cord compression. MRI LUMBAR SPINE FINDINGS Segmentation:  Last fully open disc space labeled L5-S1. Alignment:  Mild straightening and slight curvature. Vertebrae:  Endplate degenerative changes L4-5. Conus medullaris: Extends to the T12-L1 level. Paraspinal and other soft tissues: Tiny right renal cyst. Disc levels: L1-2:  Negative. L2-3: Mild facet degenerative changes. Minimal bulge greater left lateral position touching but not compressing exiting left L2 nerve root. L3-4: Bulge. Facet degenerative changes. Mild bilateral foraminal narrowing. Multifactorial mild spinal stenosis and lateral recess narrowing bilaterally. L4-5: Facet degenerative changes and ligamentum flavum hypertrophy greater on the right. Disc degeneration with disc space narrowing endplate reactive changes greater on the right. Bulge and osteophyte greater right foraminal/ lateral position encroaching upon exiting right L4 nerve root. Moderate narrowing right lateral thecal sac and right lateral recess. Mild spinal stenosis greater on right. L5-S1: Moderate facet degenerative changes. Bulge with osteophyte. Mild lateral recess narrowing. Mild foraminal narrowing greater on the left. IMPRESSION: MRI THORACIC SPINE Abnormal signal within the thoracic cord extends from the lower T3 through the mid T10 level suggestive of edema. Additionally, blood breakdown products are noted within the right aspect of the cord extending from the T5-6 level to the lower T9 level (maximal at the T7-8 level). Mild enhancement of the cord T6-7 through upper T8 level. Etiology of thoracic cord abnormality is  indeterminate. Considerations include: Hemorrhagic transverse myelitis secondary to viral trigger (infection or vaccination). Patient reports possible right-sided shingles in the past and therefore this may be related to such. Result of myelitis secondary to recent infection (such as influenza) with hemorrhagic conversion or ADEM with hemorrhagic conversion are considerations. Thoracic cord ischemia with hemorrhagic conversion. Given the fact the patient has a slightly dilated ascending thoracic aorta, CT angiogram of the chest abdomen and pelvis may be considered to exclude dissection not detected on the current exam. Vascular malformation such as a cavernoma with bleeding and surrounding edema. Although there is blush like enhancement, appearance is not typical for dural fistula as vessels are not seen along the periphery of the cord. Additionally, symptoms occurred more acutely than expected with dural fistula. Post inflammatory myelitis/vasculitis with bleeding (such as Behcet's disease, sarcoidosis, Wegener's, etc.). Tumor felt last likely consideration (ependymomas can bleed but usually enhance more). Metastatic disease felt less likely unless the patient has a known malignancy with hemorrhagic propensity such as melanoma. Neuromyelitis optica. Multiple sclerosis with hemorrhagic conversion felt less likely consideration. C6-7 moderate bulge/ osteophyte with mild spinal stenosis and minimal cord flattening. Axial images not obtained. Scattered minimal bulges throughout the thoracic region without large thoracic disc herniation causing cord compression. Mild edema within the right C7 facet possibly degenerative in origin. MRI LUMBAR SPINE Multi-level degenerative changes most prominent L4-5 level as detailed above. These results were called by telephone at the time of interpretation on 05/26/2016 at 12:44 pm to Dr.  Linker, who verbally acknowledged these results. Electronically Signed   By: Lacy Duverney M.D.    On: 05/26/2016 13:15   Mr Lumbar Spine W Wo Contrast  Result Date: 05/26/2016 EXAM: MRI THORACIC AND LUMBAR SPINE WITHOUT AND WITH CONTRAST TECHNIQUE: Multiplanar and multiecho pulse sequences of the thoracic and lumbar spine were obtained without and with intravenous contrast. CONTRAST:  20mL MULTIHANCE GADOBENATE DIMEGLUMINE 529 MG/ML IV SOLN COMPARISON:  None. FINDINGS: MRI THORACIC SPINE FINDINGS Alignment:  Minimal curvature. Vertebrae: Mild edema within the right C7 facet possibly degenerative in origin. Cord: Abnormal signal within the cord extends from the lower T3 through the mid T10 level. Additionally, blood breakdown products are noted within the right aspect of the cord extending from the T5-6 level to the lower T9 level (maximal at the T7-8 level). Mild enhancement of the cord T6-7 through upper T8 level. Paraspinal and other soft tissues: Mild dilation thoracic aorta with ascending thoracic aorta measuring up to 4.2 cm. Disc levels: C6-7: Moderate bulge/ osteophyte with mild spinal stenosis and minimal cord flattening. Axial images not obtained. Scattered minimal bulges throughout the thoracic region without large thoracic disc herniation causing cord compression. MRI LUMBAR SPINE FINDINGS Segmentation:  Last fully open disc space labeled L5-S1. Alignment:  Mild straightening and slight curvature. Vertebrae:  Endplate degenerative changes L4-5. Conus medullaris: Extends to the T12-L1 level. Paraspinal and other soft tissues: Tiny right renal cyst. Disc levels: L1-2:  Negative. L2-3: Mild facet degenerative changes. Minimal bulge greater left lateral position touching but not compressing exiting left L2 nerve root. L3-4: Bulge. Facet degenerative changes. Mild bilateral foraminal narrowing. Multifactorial mild spinal stenosis and lateral recess narrowing bilaterally. L4-5: Facet degenerative changes and ligamentum flavum hypertrophy greater on the right. Disc degeneration with disc space  narrowing endplate reactive changes greater on the right. Bulge and osteophyte greater right foraminal/ lateral position encroaching upon exiting right L4 nerve root. Moderate narrowing right lateral thecal sac and right lateral recess. Mild spinal stenosis greater on right. L5-S1: Moderate facet degenerative changes. Bulge with osteophyte. Mild lateral recess narrowing. Mild foraminal narrowing greater on the left. IMPRESSION: MRI THORACIC SPINE Abnormal signal within the thoracic cord extends from the lower T3 through the mid T10 level suggestive of edema. Additionally, blood breakdown products are noted within the right aspect of the cord extending from the T5-6 level to the lower T9 level (maximal at the T7-8 level). Mild enhancement of the cord T6-7 through upper T8 level. Etiology of thoracic cord abnormality is indeterminate. Considerations include: Hemorrhagic transverse myelitis secondary to viral trigger (infection or vaccination). Patient reports possible right-sided shingles in the past and therefore this may be related to such. Result of myelitis secondary to recent infection (such as influenza) with hemorrhagic conversion or ADEM with hemorrhagic conversion are considerations. Thoracic cord ischemia with hemorrhagic conversion. Given the fact the patient has a slightly dilated ascending thoracic aorta, CT angiogram of the chest abdomen and pelvis may be considered to exclude dissection not detected on the current exam. Vascular malformation such as a cavernoma with bleeding and surrounding edema. Although there is blush like enhancement, appearance is not typical for dural fistula as vessels are not seen along the periphery of the cord. Additionally, symptoms occurred more acutely than expected with dural fistula. Post inflammatory myelitis/vasculitis with bleeding (such as Behcet's disease, sarcoidosis, Wegener's, etc.). Tumor felt last likely consideration (ependymomas can bleed but usually enhance  more). Metastatic disease felt less likely unless the patient has a known malignancy with hemorrhagic  propensity such as melanoma. Neuromyelitis optica. Multiple sclerosis with hemorrhagic conversion felt less likely consideration. C6-7 moderate bulge/ osteophyte with mild spinal stenosis and minimal cord flattening. Axial images not obtained. Scattered minimal bulges throughout the thoracic region without large thoracic disc herniation causing cord compression. Mild edema within the right C7 facet possibly degenerative in origin. MRI LUMBAR SPINE Multi-level degenerative changes most prominent L4-5 level as detailed above. These results were called by telephone at the time of interpretation on 05/26/2016 at 12:44 pm to Dr. Karma GanjaLinker, who verbally acknowledged these results. Electronically Signed   By: Lacy DuverneySteven  Olson M.D.   On: 05/26/2016 13:15   [place] Renal  Result Date: 05/27/2016 CLINICAL DATA:  Acute renal failure EXAM: RENAL / URINARY TRACT ULTRASOUND COMPLETE COMPARISON:  None. FINDINGS: Right Kidney: Length: 10.2 cm. Echogenicity within normal limits. No mass or hydronephrosis visualized. Left Kidney: Length: 11.2 cm. Echogenicity within normal limits. No mass or hydronephrosis visualized. Bladder: Decompressed by Foley catheter. IMPRESSION: No evidence for hydronephrosis. Electronically Signed   By: Kennith CenterEric  Mansell M.D.   On: 05/27/2016 17:51   [facility] W Wo Contrast  Result Date: 05/27/2016 CLINICAL DATA:  Thoracic Spinal cord hemorrhagic lesion with edema. Paraplegia. EXAM: MRA SPINE WITHOUT AND WITH CONTRAST TECHNIQUE: Multiplanar and multiecho pulse sequences of the spine were obtained without and with intravenous contrast. Angiographic images of the spine were obtained using MRA technique without and with intravenous contrast. CONTRAST:  See below 20mL MULTIHANCE GADOBENATE DIMEGLUMINE 529 MG/ML IV SOLN COMPARISON:  MRI brain reported separately FINDINGS: There is good opacification of the  spinal arteries and veins. No spinal AVM or fistula is identified. The cord remains enlarged, T2 hyperintense, with heterogeneity similar to 05/26/2016. See differential considerations as outlined on that study. IMPRESSION: No spinal AVM or fistula is identified. Electronically Signed   By: Elsie StainJohn T Curnes M.D.   On: 05/27/2016 16:56   Ct Angio Chest/abd/pel For Dissection W And/or Wo Contrast  Result Date: 05/26/2016 CLINICAL DATA:  Low back pain and abdominal pain. Evidence for thoracic cord ischemia with hemorrhagic conversion on recent MRI. CT angiogram was recommended to exclude an aortic dissection. EXAM: CT ANGIOGRAPHY CHEST, ABDOMEN AND PELVIS TECHNIQUE: Multidetector CT imaging through the chest, abdomen and pelvis was performed using the standard protocol during bolus administration of intravenous contrast. Multiplanar reconstructed images and MIPs were obtained and reviewed to evaluate the vascular anatomy. CONTRAST:  100 mL Isovue 370 COMPARISON:  Thoracic spine MRI 05/26/2016 FINDINGS: CTA CHEST FINDINGS Cardiovascular: The aortic root at the sinuses of Valsalva measures 4.5 cm. The mid ascending thoracic aorta is aneurysmal measuring up to 4.2 cm. 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 The great vessels are patent. Negative for an aortic dissection. Proximal descending thoracic aorta measures 3.2 cm. Pulmonary arteries are patent. Mediastinum/Nodes: No evidence for chest lymphadenopathy. No significant pericardial fluid. Lungs/Pleura: Trachea and mainstem bronchi are patent. Focal pleural thickening or nodularity along the left major fissure on sequence 7, image 80 measuring up to 5 mm. Otherwise, the lungs are clear. No significant airspace disease or consolidation. No pleural effusions. Musculoskeletal: No acute bone abnormality. Review of the MIP images confirms the above findings. CTA ABDOMEN AND PELVIS FINDINGS VASCULAR Aorta: Normal  caliber aorta without aneurysm, dissection, vasculitis or significant stenosis. Celiac: Patent without evidence of aneurysm, dissection, vasculitis or significant stenosis. SMA: Patent without evidence of aneurysm, dissection, vasculitis or significant stenosis. Renals: Both renal arteries are patent without  evidence of aneurysm, dissection, vasculitis, fibromuscular dysplasia or significant stenosis. There is an accessory inferior left renal artery which is patent. IMA: Patent without evidence of aneurysm, dissection, vasculitis or significant stenosis. Inflow: Mild wall calcifications in the iliac arteries without significant stenosis. Internal and external iliac arteries are patent bilaterally. No dissection. Veins: No obvious venous abnormality within the limitations of this arterial phase study. Review of the MIP images confirms the above findings. NON-VASCULAR Hepatobiliary: No acute abnormality to the liver or gallbladder. Pancreas: Normal appearance of the pancreas without inflammation or duct dilatation. Spleen: Normal appearance of spleen without enlargement. Adrenals/Urinary Tract: Normal adrenal glands. Normal appearance of both kidneys without suspicious lesion or hydronephrosis. Urinary bladder is decompressed with a Foley catheter. Stomach/Bowel: There is a large amount of stool in the right colon and hepatic flexure. No evidence for bowel obstruction or focal inflammation. Lymphatic: No significant lymph node enlargement in the abdomen and pelvis. Soft tissue in the mesentery adjacent to loops of bowel on sequence 6, image 257 is nonspecific and may represent a few mesenteric lymph nodes. Reproductive: Prostate is unremarkable. Seminal vesicles are unremarkable. Other: No significant ascites in the abdomen or pelvis. Musculoskeletal: Mild retrolisthesis at L4-L5 with some disc space narrowing. Refer to recent spine MRI. Review of the MIP images confirms the above findings. IMPRESSION: Negative for an  aortic dissection. No acute intrathoracic or intra-abdominal disease. 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 Ascending thoracic aorta measures up to 4.2 cm. Recommend annual imaging followup by CTA or MRA. This recommendation follows 2010 ACCF/AHA/AATS/ACR/ASA/SCA/SCAI/SIR/STS/SVM Guidelines for the Diagnosis and Management of Patients with Thoracic Aortic Disease. Circulation. 2010; 121: e266-e369 5 mm nodule along the left major fissure is indeterminate. No follow-up needed if patient is low-risk. Non-contrast chest CT can be considered in 12 months if patient is high-risk. This recommendation follows the consensus statement: Guidelines for Management of Incidental Pulmonary Nodules Detected on CT Images: From the Fleischner Society 2017; Radiology 2017; 284:228-243. Large amount of stool involving the right colon. Electronically Signed   By: Richarda Overlie M.D.   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 Does the need for close, 24 hr/day medical supervision in concert with the patient's rehab needs make it unreasonable for this patient to be served in a less intensive setting? Yes 2. Co-Morbidities requiring supervision/potential complications: AKI, CKD, HTN, management of neurogenic bowel and bladder 3. Due to bladder management, bowel management, safety, skin/wound care, disease management, medication administration, pain management and patient education, does the patient require 24 hr/day rehab nursing? Yes 4. Does the patient require coordinated care of a physician, rehab nurse, PT (1-2 hrs/day, 5 days/week) and OT (1-2 hrs/day, 5 days/week) to address physical and functional deficits in the context of the above medical diagnosis(es)? Yes Addressing deficits in the following areas: balance, endurance, locomotion, strength, transferring, bowel/bladder control, bathing, dressing,  feeding, grooming, toileting and psychosocial support 5. Can the patient actively participate in an intensive therapy program of at least 3 hrs of therapy per day at least 5 days per week? Yes 6. The potential for patient to make measurable gains while on inpatient rehab is excellent 7. Anticipated functional outcomes upon discharge from inpatient rehab are modified independent and supervision  with PT, modified independent and supervision with OT, n/a with SLP. 8. Estimated rehab length of stay to reach the above functional goals is: potentially 15-22 days 9. Does the  patient have adequate social supports and living environment to accommodate these discharge functional goals? Yes and Potentially 10. Anticipated D/C setting: Home 11. Anticipated post D/C treatments: HH therapy and Outpatient therapy 12. Overall Rehab/Functional Prognosis: excellent  RECOMMENDATIONS: This patient's condition is appropriate for continued rehabilitative care in the following setting: CIR Patient has agreed to participate in recommended program. Yes Note that insurance prior authorization may be required for reimbursement for recommended care.  Comment: Rehab Admissions Coordinator to follow up.  Thanks,  [name], MD, [place] [facility]    Perry Amor., Perry Morrison 05/28/2016

## 2016-06-01 NOTE — H&P (Signed)
Patient is discharged from room 5M18 at this time. Alert and in stable condition. IV site d/c'd. Report given to receiving nurse Michelle NasutiElena, RN on unit [phone] with all questions answered. Transported out of unit via chair with all belongings at side.

## 2016-06-01 NOTE — Progress Notes (Signed)
Patient was informed about rehab process including patient safety plan and rehab booklet. 

## 2016-06-02 ENCOUNTER — Inpatient Hospital Stay (HOSPITAL_COMMUNITY): Payer: Medicaid Other

## 2016-06-02 ENCOUNTER — Inpatient Hospital Stay (HOSPITAL_COMMUNITY): Payer: Self-pay | Admitting: Occupational Therapy

## 2016-06-02 DIAGNOSIS — M7989 Other specified soft tissue disorders: Secondary | ICD-10-CM

## 2016-06-02 LAB — COMPREHENSIVE METABOLIC PANEL
ALBUMIN: 3.5 g/dL (ref 3.5–5.0)
ALK PHOS: 65 U/L (ref 38–126)
ALT: 316 U/L — ABNORMAL HIGH (ref 17–63)
ANION GAP: 8 (ref 5–15)
AST: 108 U/L — ABNORMAL HIGH (ref 15–41)
BUN: 27 mg/dL — ABNORMAL HIGH (ref 6–20)
CALCIUM: 8.8 mg/dL — AB (ref 8.9–10.3)
CHLORIDE: 99 mmol/L — AB (ref 101–111)
CO2: 27 mmol/L (ref 22–32)
Creatinine, Ser: 1.2 mg/dL (ref 0.61–1.24)
GFR calc non Af Amer: >60 mL/min (ref >60)
GLUCOSE: 147 mg/dL — AB (ref 65–99)
Potassium: 3.9 mmol/L (ref 3.5–5.1)
Sodium: 134 mmol/L — ABNORMAL LOW (ref 135–145)
Total Bilirubin: 1.2 mg/dL (ref 0.3–1.2)
Total Protein: 6.6 g/dL (ref 6.5–8.1)

## 2016-06-02 LAB — CBC WITH DIFFERENTIAL/PLATELET
BASOS PCT: 0 %
Basophils Absolute: 0 10*3/uL (ref 0.0–0.1)
EOS ABS: 0 10*3/uL (ref 0.0–0.7)
EOS PCT: 0 %
HCT: 40.9 % (ref 39.0–52.0)
HEMOGLOBIN: 14.1 g/dL (ref 13.0–17.0)
Lymphocytes Relative: 22 %
Lymphs Abs: 2.6 10*3/uL (ref 0.7–4.0)
MCH: 29.3 pg (ref 26.0–34.0)
MCHC: 34.5 g/dL (ref 30.0–36.0)
MCV: 85 fL (ref 78.0–100.0)
MONOS PCT: 10 %
Monocytes Absolute: 1.2 10*3/uL — ABNORMAL HIGH (ref 0.1–1.0)
NEUTROS PCT: 68 %
Neutro Abs: 8.1 10*3/uL — ABNORMAL HIGH (ref 1.7–7.7)
Platelets: 207 10*3/uL (ref 150–400)
RBC: 4.81 MIL/uL (ref 4.22–5.81)
RDW: 12.6 % (ref 11.5–15.5)
WBC: 11.9 10*3/uL — AB (ref 4.0–10.5)

## 2016-06-02 LAB — ANCA TITERS: Atypical P-ANCA titer: <1:20 {titer}

## 2016-06-02 LAB — ANTINUCLEAR ANTIBODIES, IFA: ANA Ab, IFA: NEGATIVE

## 2016-06-02 LAB — SJOGRENS SYNDROME-A EXTRACTABLE NUCLEAR ANTIBODY: SSA (Ro) (ENA) Antibody, IgG: <0.2 AI (ref 0.0–0.9)

## 2016-06-02 LAB — ANGIOTENSIN CONVERTING ENZYME: Angiotensin-Converting Enzyme: 27 U/L (ref 14–82)

## 2016-06-02 LAB — SJOGRENS SYNDROME-B EXTRACTABLE NUCLEAR ANTIBODY: SSB (La) (ENA) Antibody, IgG: <0.2 AI (ref 0.0–0.9)

## 2016-06-02 MED ORDER — SENNOSIDES-DOCUSATE SODIUM 8.6-50 MG PO TABS
2.0000 | ORAL_TABLET | Freq: Every day | ORAL | Status: DC
  Administered 2016-06-03 – 2016-06-09 (×7): 2 via ORAL
  Filled 2016-06-02 (×8): qty 2

## 2016-06-02 MED ORDER — SENNOSIDES-DOCUSATE SODIUM 8.6-50 MG PO TABS: 2.0000 | ORAL_TABLET | Freq: Every day | ORAL | Status: DC

## 2016-06-02 MED ORDER — FLEET ENEMA 7-19 GM/118ML RE ENEM
1.0000 | ENEMA | Freq: Once | RECTAL | Status: AC
  Administered 2016-06-03: 1 via RECTAL
  Filled 2016-06-02: qty 1

## 2016-06-02 MED ORDER — BISACODYL 10 MG RE SUPP
10.0000 mg | Freq: Every day | RECTAL | Status: DC
  Administered 2016-06-03 – 2016-07-10 (×33): 10 mg via RECTAL
  Filled 2016-06-02 (×36): qty 1

## 2016-06-02 NOTE — Evaluation (Signed)
Occupational Therapy Assessment and Plan  Patient Details  Name: Perry Morrison MRN: [id number] Date of Birth: 1960-06-24  OT Diagnosis: abnormal posture, ataxia and R LE paraparesis  Rehab Potential: Rehab Potential (ACUTE ONLY): Good ELOS: 2.5- 3 weeks   Today's Date: 06/02/2016 OT Individual Time: 1045-1200 and 1430-1500 OT Individual Time Calculation (min): 75 min   And 30 min  Problem List:  Patient Active Problem List   Diagnosis Date Noted  . 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 Weakness of right leg   . Neurogenic bowel   . Neurogenic bladder   . Incomplete paraplegia ([place])   . GI bleed 05/30/2016  . Hypertension 05/26/2016  . Constipation 05/26/2016  . Abnormal MRI, thoracic spine 05/26/2016  . Myelitis ([place])   . Right leg weakness   . Urinary retention   . AKI (acute kidney injury) ([place])   . Aneurysm ([place])   . CKD (chronic kidney disease), stage III     Past Medical History:  Past Medical History:  Diagnosis Date  . AKI (acute kidney injury) ([place])   . Aneurysm ([place])    aortic root and ascending thoracic aorta 4.2cm  . Hypertension   . Myelitis ([place])   . Right leg weakness   . Urinary retention    Past Surgical History:  Past Surgical History:  Procedure Laterality Date  . FRACTURE SURGERY Right 1986    Assessment & Plan Clinical Impression: Perry Morrison a 56 y.o.right handed malewith history of hypertension on no prescription antihypertensives, CRI stage III creatinine 1.32. Per chart review patient and patient, patient currently living with afemaleroommatethat works during the day. He is going through a divorce and recently lost his job. Independent prior to admission.Most of his family is in the [place] area.Presented 05/26/2016 with abdominal pain constipation times several days. He was initially seen in the emergency room given some muscle relaxers but then developed right lower extremity weakness. MRI of the thoracic spine, reviewed,  demonstrating swelling in the midportion of the cord at T5 level with some right sided lesion consistent with hemorrhage within the cord down to the level of T9. There was some mass effect in the cord itself from the lesion. No spinal AVM or fistula identified. The rest of the cervical spine film was unremarkable. Cranial CT scan negative. MRI of the brain showed no significant white matter signal abnormalities to suggest demyelinating disease. Lumbar puncture completed total protein 81, glucose 56, CSF showed no white cells. 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 Placed on intravenous Solu-Medrol 5 doses and completed . Positive occult stool 05/30/2016 felt to be related to possible hemorrhoids and straining for bowel program with hemoglobin and hematocrit stable. Physical and occupational therapy evaluations completed 05/27/2016 with recommendations of physical medicine rehabilitation consult. Patient was admitted for a comprehensive rehabilitation program  Patient transferred to CIR on 06/01/2016 .    Patient currently requires mod with basic self-care skills secondary to muscle weakness and muscle paralysis, decreased cardiorespiratoy endurance and decreased standing balance, decreased postural control and decreased balance strategies.  Prior to hospitalization, patient could complete ADLs/IADLs with independent .  Patient will benefit from skilled intervention to decrease level of assist with basic self-care skills, increase independence with basic self-care skills and increase level of independence with iADL prior to discharge home with care partner.  Anticipate patient will require intermittent supervision with minimal physical assistance for shower transfers and follow up home health.  OT -  End of Session Activity Tolerance: Tolerates 10 - 20 min activity with multiple rests Endurance Deficit:  Yes OT Assessment Rehab Potential (ACUTE ONLY): Good Barriers to Discharge: Decreased caregiver support Barriers to Discharge Comments: Plans to d/c to grandmother's home. Unsure of what physical assist will be available OT Patient demonstrates impairments in the following area(s): Balance;Edema;Endurance;Safety;Motor;Sensory OT Basic ADL's Functional Problem(s): Bathing;Dressing;Toileting OT Advanced ADL's Functional Problem(s): Simple Meal Preparation OT Transfers Functional Problem(s): Toilet;Tub/Shower OT Additional Impairment(s): None OT Plan OT Intensity: Minimum of 1-2 x/day, 45 to 90 minutes OT Frequency: 5 out of 7 days OT Duration/Estimated Length of Stay: 2.5- 3 weeks OT Treatment/Interventions: Balance/vestibular training;Discharge planning;Community reintegration;DME/adaptive equipment instruction;Functional mobility training;Neuromuscular re-education;Psychosocial support;Patient/family education;Self Care/advanced ADL retraining;Therapeutic Activities;Splinting/orthotics;Therapeutic Exercise;UE/LE Strength taining/ROM;UE/LE Coordination activities OT Self Feeding Anticipated Outcome(s): Independent OT Basic Self-Care Anticipated Outcome(s): Mod I OT Toileting Anticipated Outcome(s): Mod I OT Bathroom Transfers Anticipated Outcome(s): Min A OT Recommendation Recommendations for Other Services: Therapeutic Recreation consult Therapeutic Recreation Interventions: Outing/community reintergration Patient destination: Home Follow Up Recommendations: Home health OT Equipment Recommended: To be determined   Skilled Therapeutic Intervention Session One: Pt seen for OT eval and ADL bathing/dressing session. Pt sitting up in w/c upon arrival, agreeable to tx session, and voicing desire to attempt void on toilet. Completed min A squat pivot with use of grab bars to toilet, requiring increased assist to return to w/c without support of grab bars. STEADY used for transfer into shower,  pt standing in STEADY with supervision with heavy reliance on UEs. He bathed seated on tub bench, lateral leans to complete buttock hygiene and able to reach to floor to wash feet.  He dressed seated in w/c, standing into STEADY and with steadying assist able to pull pants up. Pt left seated in w/c at end of session, all needs in reach. Educated throughout session regarding CIR, SCI, POC, OT goals, and d/c planning.   Session Two: Pt seen for OT session focusing on functional standing balance and endurance. Pt reclined in recliner upon arrival, agreeable to tx session. Squat pivot transfer with guarding assist for transfer to w/c. He self propelled w/c to therapy gym with supervision. Completed x5 sit >stands at side of parallel bars with min-mod A to stand and heavy blocking of R knee to prevent buckling. Pt tolerated ~45 seconds each trial, able to alternate UE support on bar and short bouts of time without any UE support. Worked on weight shifting R<> L with CGA and VCs for upright posture. Pt returned to room at end of session, requesting return to supine. Min A for management of R LE onto bed. Pt left in supine at end of session, all needs in reach, reviewed use of call bell for assist with mobility.   OT Evaluation Precautions/Restrictions  Precautions Precautions: Fall Precaution Comments: RLE hemiparesis Restrictions Weight Bearing Restrictions: No General Chart Reviewed: Yes Pain   No/ denies pain Home Living/Prior Functioning Home Living Available Help at Discharge: Family Type of Home: House Home Access: Stairs to enter Technical brewer of Steps: 1 Entrance Stairs-Rails: None Home Layout: One level Bathroom Shower/Tub: Tub/shower unit, Architectural technologist: Standard Additional Comments: Information is about his grandmother's home which is where he plans to d/c  Lives With: Other (Comment) (Plans to move in with grandmother) Prior Function Level of Independence:  Independent with gait, Independent with transfers, Independent with homemaking with ambulation, Independent with basic ADLs  Able to Take Stairs?: Yes Driving: Yes Vocation:  (works at [employer]) Comments: very  active, lifts weights, plays pickle ball. Played football at [employer]. Has a 71 yo son who lives with ex wife Vision/Perception  Vision- History Baseline Vision/History: No visual deficits Patient Visual Report: No change from baseline  Cognition Overall Cognitive Status: Within Functional Limits for tasks assessed Arousal/Alertness: Awake/alert Orientation Level: Person;Place;Situation Person: Oriented Place: Oriented Situation: Oriented Year: 2018 Month: January Day of Week: Correct Memory: Appears intact Immediate Memory Recall: Sock;Blue;Bed Memory Recall: Sock;Blue;Bed Memory Recall Sock: Without Cue Memory Recall Blue: Without Cue Memory Recall Bed: Without Cue Awareness: Appears intact Problem Solving: Appears intact Safety/Judgment: Appears intact Sensation Sensation Light Touch: Impaired Detail Light Touch Impaired Details: Impaired RLE Proprioception: Impaired by gross assessment Coordination Gross Motor Movements are Fluid and Coordinated: No Coordination and Movement Description: R LE peresis Motor  Motor Motor: Paraplegia;Other (comment) Motor - Skilled Clinical Observations: incomplete paraplegia affecting RLE Trunk/Postural Assessment  Cervical Assessment Cervical Assessment: Within Functional Limits Thoracic Assessment Thoracic Assessment: Within Functional Limits Lumbar Assessment Lumbar Assessment: Within Functional Limits Postural Control Postural Control: Within Functional Limits  Balance Balance Balance Assessed: Yes Static Sitting Balance Static Sitting - Level of Assistance: 5: Stand by assistance Dynamic Sitting Balance Dynamic Sitting - Balance Support: During functional activity Dynamic Sitting - Level of Assistance: 5:  Stand by assistance Sitting balance - Comments: Sitting to complete bathing tasks Static Standing Balance Static Standing - Balance Support: During functional activity Static Standing - Level of Assistance: 4: Min assist Static Standing - Comment/# of Minutes: Standing in STEADY Dynamic Standing Balance Dynamic Standing - Level of Assistance: 1: +2 Total assist;2: Max assist;3: Mod assist Extremity/Trunk Assessment RUE Assessment RUE Assessment: Within Functional Limits LUE Assessment LUE Assessment: Within Functional Limits   See Function Navigator for Current Functional Status.   Refer to Care Plan for Long Term Goals  Recommendations for other services: Therapeutic Recreation  Outing/community reintegration   Discharge Criteria: Patient will be discharged from OT if patient refuses treatment 3 consecutive times without medical reason, if treatment goals not met, if there is a change in medical status, if patient makes no progress towards goals or if patient is discharged from hospital.  The above assessment, treatment plan, treatment alternatives and goals were discussed and mutually agreed upon: by patient  Ernestina Patches 06/02/2016, 12:55 PM

## 2016-06-02 NOTE — Evaluation (Signed)
Physical Therapy Assessment and Plan  Patient Details  Name: Perry Morrison MRN: [id number] Date of Birth: May 22, 1960  PT Diagnosis: Difficulty walking, Impaired sensation, Muscle weakness, Paraplegia, Pain in joint and Paralysis Rehab Potential: Good ELOS: 17-21 days   Today's Date: 06/02/2016 PT Individual Time: 0900-1000 PT Individual Time Calculation (min): 60 min    Problem List:  Patient Active Problem List   Diagnosis Date Noted  . 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 Weakness of right leg   . Neurogenic bowel   . Neurogenic bladder   . Incomplete paraplegia ([place])   . GI bleed 05/30/2016  . Hypertension 05/26/2016  . Constipation 05/26/2016  . Abnormal MRI, thoracic spine 05/26/2016  . Myelitis ([place])   . Right leg weakness   . Urinary retention   . AKI (acute kidney injury) ([place])   . Aneurysm ([place])   . CKD (chronic kidney disease), stage III     Past Medical History:  Past Medical History:  Diagnosis Date  . AKI (acute kidney injury) ([place])   . 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 Hypertension   . Myelitis ([place])   . Right leg weakness   . Urinary retention    Past Surgical History:  Past Surgical History:  Procedure Laterality Date  . FRACTURE SURGERY Right 1986    Assessment & Plan Clinical Impression: Patient is a 56 y.o. year old male with recent admission to the hospital with history of hypertension on no prescription antihypertensives, CRI stage III creatinine 1.32. Per chart review patient and patient, patient currently living with afemaleroommatethat works during the day. He is going through a divorce and recently lost his job. Independent prior to admission.Most of his family is in the [place] area.Presented 05/26/2016 with abdominal pain constipation times several days. He was initially seen in the emergency room given some muscle relaxers but then developed right lower extremity weakness. MRI of the thoracic spine,  reviewed, demonstrating swelling in the midportion of the cord at T5 level with some right sided lesion consistent with hemorrhage within the cord down to the level of T9. There was some mass effect in the cord itself from the lesion. No spinal AVM or fistula identified. The rest of the cervical spine film was unremarkable. Cranial CT scan negative. MRI of the brain showed no significant white matter signal abnormalities to suggest demyelinating disease. Lumbar puncture completed total protein 81, glucose 56, CSF showed no white cells. 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 Placed on intravenous Solu-Medrol 5 doses and completed . Positive occult stool 05/30/2016 felt to be related to possible hemorrhoids and straining for bowel program with hemoglobin and hematocrit stable. Physical and occupational therapy evaluations completed 05/27/2016 with recommendations of physical medicine rehabilitation consult.  Patient transferred to CIR on 06/01/2016 .   Patient currently requires mod assist transfers and +2 for gait/stairs with mobility secondary to muscle weakness and muscle paralysis, decreased cardiorespiratoy endurance, unbalanced muscle activation and decreased sitting balance, decreased standing balance and decreased balance strategies.  Prior to hospitalization, patient was independent  with mobility and lived with   in a House home. Pt planning to discharge to his grandmother's home in [facility]. Home access is 1Stairs to enter.  Patient will benefit from skilled PT intervention to maximize safe functional mobility, minimize fall risk and decrease caregiver burden for planned discharge home with 24 hour supervision.  Anticipate patient will benefit  from follow up [place] at discharge.  PT - End of Session Activity Tolerance: Decreased this session Endurance Deficit: Yes PT Assessment Rehab Potential  (ACUTE/IP ONLY): Good Barriers to Discharge: Decreased caregiver support (plans to d/c to grandmother's home) Barriers to Discharge Comments: unsure what assist grandmother can provide; has 1 step to enter her home PT Patient demonstrates impairments in the following area(s): Balance;Endurance;Motor;Sensory;Pain PT Transfers Functional Problem(s): Bed Mobility;Bed to Chair;Car;Furniture PT Locomotion Functional Problem(s): Ambulation;Wheelchair Mobility;Stairs PT Plan PT Intensity: Minimum of 1-2 x/day ,45 to 90 minutes PT Frequency: 5 out of 7 days PT Duration Estimated Length of Stay: 17-21 days PT Treatment/Interventions: Ambulation/gait training;Balance/vestibular training;Community reintegration;Discharge planning;Disease management/prevention;DME/adaptive equipment instruction;Functional electrical stimulation;Functional mobility training;Neuromuscular re-education;Pain management;Patient/family education;Psychosocial support;Skin care/wound management;Splinting/orthotics;Stair training;Therapeutic Activities;Therapeutic Exercise;UE/LE Strength taining/ROM;UE/LE Coordination activities;Wheelchair propulsion/positioning PT Transfers Anticipated Outcome(s): mod I w/c level basic transfers; supervision car transfer PT Locomotion Anticipated Outcome(s): mod I w/c mobility; supervision to min assist gait; min assist stairs PT Recommendation Recommendations for Other Services: Therapeutic Recreation consult Therapeutic Recreation Interventions: Outing/community reintergration Follow Up Recommendations: Home health PT Patient destination: Home Equipment Recommended: Wheelchair cushion (measurements);Wheelchair (measurements);Rolling walker with 5" wheels  Skilled Therapeutic Intervention Evaluation completed (see details above and below) with education on PT POC and goals and individual treatment initiated with focus on functional transfer training for squat pivot technique, neuro re-ed in  standing for weightbearing through RLE for pre-gait and initiating stairs (pt requires facilitation at RLE for advancement of foot during forward and backward stepping and blocking of R knee during stance), w/c mobility training for functional mobility and overall endurance, and overall education. Pt very motivated throughout session.    PT Evaluation Precautions/Restrictions Precautions Precautions: Fall Precaution Comments: RLE hemiparesis Restrictions Weight Bearing Restrictions: No Pain  Denies specific pain this AM.  Home Living/Prior Functioning Home Living Available Help at Discharge: Family Type of Home: House Home Access: Stairs to enter Technical brewer of Steps: 1 Entrance Stairs-Rails: None Home Layout: One level Additional Comments: Information is about his grandmother's home which is where he plans to d/c Prior Function Level of Independence: Independent with gait;Independent with transfers;Independent with homemaking with ambulation;Independent with basic ADLs  Able to Take Stairs?: Yes Driving: Yes Vocation:  (works at [employer]) Comments: very active, lifts weights, plays pickle ball. Played football at [employer]. Has a 74 yo son who lives with ex wife Cognition Overall Cognitive Status: Within Functional Limits for tasks assessed Sensation Sensation Light Touch: Impaired Detail Light Touch Impaired Details: Impaired RLE (able to distinguish LT but diminished) Proprioception: Impaired by gross assessment Coordination Gross Motor Movements are Fluid and Coordinated: No Motor  Motor Motor: Paraplegia;Other (comment) Motor - Skilled Clinical Observations: incomplete paraplegia affecting RLE     Trunk/Postural Assessment  Cervical Assessment Cervical Assessment: Within Functional Limits  Balance Balance Balance Assessed: Yes Static Sitting Balance Static Sitting - Level of Assistance: 5: Stand by assistance Dynamic Sitting Balance Dynamic  Sitting - Level of Assistance: 4: Min assist Static Standing Balance Static Standing - Level of Assistance: 4: Min assist Dynamic Standing Balance Dynamic Standing - Level of Assistance: 1: +2 Total assist;2: Max assist;3: Mod assist Extremity Assessment   see OT eval for UE details; grossly WFL   RLE Assessment RLE Assessment: Exceptions to [facility] RLE Strength RLE Overall Strength Comments: no active movement in supine except trace at hip; requires blocking of R knee in standing to prevent buckling LLE Assessment LLE Assessment: Exceptions to [facility]   See Function Navigator for Current Functional Status.  Refer to Care Plan for Long Term Goals  Recommendations for other services: Therapeutic Recreation  Outing/community reintegration  Discharge Criteria: Patient will be discharged from PT if patient refuses treatment 3 consecutive times without medical reason, if treatment goals not met, if there is a change in medical status, if patient makes no progress towards goals or if patient is discharged from hospital.  The above assessment, treatment plan, treatment alternatives and goals were discussed and mutually agreed upon: by patient  Juanna Cao, PT, DPT  06/02/2016, 12:43 PM

## 2016-06-02 NOTE — Progress Notes (Signed)
Physical Therapy Session Note  Patient Details  Name: Perry GraffJamie Loeper MRN: [id number] Date of Birth: 11/13/1960  Today's Date: 06/02/2016 PT Individual Time: 1300-1400 PT Individual Time Calculation (min): 60 min   Short Term Goals: Week 1:  PT Short Term Goal 1 (Week 1): Pt will complete basic transfers with min assist PT Short Term Goal 2 (Week 1): Pt will be able to gait x 25' with LRAD with max of 1 (+2 for w/c follow/safety) PT Short Term Goal 3 (Week 1): Pt will be able to negotiate 1 step with mod assist PT Short Term Goal 4 (Week 1): Pt will be able to manage w/c parts to set up for transfers with supervision cues  Skilled Therapeutic Interventions/Progress Updates:   Session focused on w/c propulsion for general strengthening/endurance and mobility training with cues for efficient propulsion technique, simulated car transfer using RW for stand pivot with +2 for safety and cues for sequencing with physical advancement and blocking of RLE by PT, and neuro re-ed during gait and on Nustep (x 12 min on level 4 with decreasing use of UE's for assist - utilized RLE abduction block piece to maintain neutral alignment). During gait, required +2 assist with 1 person to assist with balance and w/c follow and PT facilitation of RLE advancement and blocking at R knee with cues for weighttshfting x 23'. Pt completed basic squat pivot transfers with min to mod assist (on uneven surface) this session. Transferred to recliner end of session with all needs in reach.   Therapy Documentation Precautions:  Precautions Precautions: Fall Precaution Comments: RLE hemiparesis Restrictions Weight Bearing Restrictions: No  Pain:  Denies pain.   See Function Navigator for Current Functional Status.   Therapy/Group: Individual Therapy  Karolee StampsGray, Maylen Waltermire Darrol PokeBrescia  Naythan Douthit B. Raheel Kunkle, PT, DPT  06/02/2016, 2:02 PM

## 2016-06-02 NOTE — Progress Notes (Signed)
Pt c/o increased bloating, abdomen is firm, distended, skin taut, denies ability to pass flatus, states no flatus for 2 days, states small bm on 1/21.  Call to Jesusita Okaan, [place], order for stat KUB.

## 2016-06-02 NOTE — Progress Notes (Signed)
**  Preliminary report by tech**  Bilateral lower extremity venous duplex completed. There is no evidence of deep or superficial vein thrombosis involving the right and left lower extremities. All visualized vessels appear patent and compressible. There is no evidence of Baker's cysts bilaterally.  06/02/16 6:45 PM Olen CordialGreg Aseel Uhde RVT

## 2016-06-02 NOTE — Progress Notes (Signed)
[facility] PHYSICAL MEDICINE & REHABILITATION     PROGRESS NOTE    Subjective/Complaints:  Pt had a good day with therapy. Right leg still weak.   ROS: Pt denies fever, rash/itching, headache, blurred or double vision, nausea, vomiting, abdominal pain, diarrhea, chest pain, shortness of breath, palpitations, dysuria, dizziness, neck pain, back pain, bleeding, anxiety, or depression   Objective: Vital Signs: Blood pressure (!) 151/94, pulse 61, temperature 98.1 F (36.7 C), temperature source Oral, resp. rate 20, height 6\' 1"  (1.854 m), weight 106 kg (233 lb 11 oz), SpO2 100 %. No results found. No results for input(s): WBC, HGB, HCT, PLT in the last 72 hours. No results for input(s): NA, K, CL, GLUCOSE, BUN, CREATININE, CALCIUM in the last 72 hours.  Invalid input(s): CO CBG (last 3)  No results for input(s): GLUCAP in the last 72 hours.  Wt Readings from Last 3 Encounters:  06/02/16 106 kg (233 lb 11 oz)  05/26/16 102.1 kg (225 lb)    Physical Exam:  HENT:  Head: Normocephalic and atraumatic.  Eyes: Conjunctivae and EOM are normal. Left eye exhibits no discharge.  Neck: Normal range of motion. Neck supple. No thyromegaly present.  Cardiovascular: RRR.   Respiratory: CTA B  GI: Soft. Bowel sounds are normal. He exhibits no distension.  Genitourinary:  Genitourinary Comments: Genitourinary. Foley tube remains  in place  Musculoskeletal: He exhibits no edema or tenderness.  Neurological:  Neurological: He is alert and oriented to person, place, and time.  Pt alert and appropriate.  B/l UE 5/5.  RLE: 1/5 HF, 0/5 elsewhere LLE: 2+/5 HF, 3- KE and 3/5 ADF/PF.  Sensation intact to light touch throughout DTRs 1+ LLE.   Skin: Skin is warm and dry.    Assessment/Plan: 1. Functional and mobility deficits secondary to thoracic myelopathy/transverse myelitis which require 3+ hours per day of interdisciplinary therapy in a comprehensive inpatient rehab setting. Physiatrist  is providing close team supervision and 24 hour management of active medical problems listed below. Physiatrist and rehab team continue to assess barriers to discharge/monitor patient progress toward functional and medical goals.  Function:  Bathing Bathing position      Bathing parts      Bathing assist        Upper Body Dressing/Undressing Upper body dressing                    Upper body assist        Lower Body Dressing/Undressing Lower body dressing                                  Lower body assist        Toileting Toileting Toileting activity did not occur: Safety/medical concerns   Toileting steps completed by helper: Adjust clothing prior to toileting Toileting Assistive Devices: Grab bar or rail, Prosthesis/orthosis, Oceanographer, Other (comment)  Toileting assist     Transfers Chair/bed Company secretary Comprehension    Expression    Social Interaction    Problem Solving    Memory     Medical Problem List and Plan: 1.  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   -begin CIR therapies  I -prednisone 60 mg daily 2 doses and stop  -reviewed etiology of TM with patient today 2.  DVT Prophylaxis/Anticoagulation: SCDs. Vascular study still pending 3. Pain Management: Hydrocodone as needed 4. Hypertension. Norvasc 10 mg daily Monitor when out of bed 5. Neuropsych: This patient is capable of making decisions on his own behalf. 6. Skin/Wound Care: Routine skin checks 7. Fluids/Electrolytes/Nutrition:encourage PO  -BUN/Cr elevated  -re-check labs in AM 8.CRI stage III. Follow-up chemistries 9. Suspect neurogenic bowel bladder.  -dc foley tomorrow  -bowel program  LOS (Days) 1 A FACE TO FACE EVALUATION WAS PERFORMED  Ranelle OysterSWARTZ,ZACHARY T, MD 06/02/2016 8:43  AM

## 2016-06-03 ENCOUNTER — Inpatient Hospital Stay (HOSPITAL_COMMUNITY): Payer: Self-pay | Admitting: Physical Therapy

## 2016-06-03 ENCOUNTER — Inpatient Hospital Stay (HOSPITAL_COMMUNITY): Payer: Medicaid Other | Admitting: Occupational Therapy

## 2016-06-03 ENCOUNTER — Inpatient Hospital Stay (HOSPITAL_COMMUNITY): Payer: Self-pay

## 2016-06-03 LAB — BASIC METABOLIC PANEL
ANION GAP: 8 (ref 5–15)
BUN: 27 mg/dL — ABNORMAL HIGH (ref 6–20)
CHLORIDE: 97 mmol/L — AB (ref 101–111)
CO2: 31 mmol/L (ref 22–32)
Calcium: 9 mg/dL (ref 8.9–10.3)
Creatinine, Ser: 1.19 mg/dL (ref 0.61–1.24)
GFR calc Af Amer: >60 mL/min (ref >60)
GLUCOSE: 96 mg/dL (ref 65–99)
POTASSIUM: 4.6 mmol/L (ref 3.5–5.1)
Sodium: 136 mmol/L (ref 135–145)

## 2016-06-03 LAB — NEUROMYELITIS OPTICA AUTOAB, IGG

## 2016-06-03 MED ORDER — ENOXAPARIN SODIUM 30 MG/0.3ML ~~LOC~~ SOLN
30.0000 mg | Freq: Two times a day (BID) | SUBCUTANEOUS | Status: DC
  Administered 2016-06-03 – 2016-06-13 (×21): 30 mg via SUBCUTANEOUS
  Filled 2016-06-03 (×21): qty 0.3

## 2016-06-03 MED ORDER — POLYETHYLENE GLYCOL 3350 17 G PO PACK
17.0000 g | PACK | Freq: Two times a day (BID) | ORAL | Status: DC
  Administered 2016-06-03 – 2016-06-04 (×2): 17 g via ORAL
  Filled 2016-06-03 (×3): qty 1

## 2016-06-03 NOTE — Progress Notes (Signed)
Occupational Therapy Session Note  Patient Details  Name: Perry GraffJamie Prabhakar MRN: [id number] Date of Birth: 02/10/1961  Today's Date: 06/03/2016 OT Individual Time: 07:30-08:30 OT Individual Time Calculation (min): 60 min    Short Term Goals: Week 1:  OT Short Term Goal 1 (Week 1): Pt will complete LB dressing sit <> stand with min A using AE PRN OT Short Term Goal 2 (Week 1): Pt will transfer w/c <> toilet with min A OT Short Term Goal 3 (Week 1): Pt will complete 1 grooming task in standing to increase standing tolerance for ADLs OT Short Term Goal 4 (Week 1): Pt will manage w/c parts independently during functional tasks/ transfers  Skilled Therapeutic Interventions/Progress Updates:    Pt sitting on toilet upon arrival, attempting to have BM. Pt still unable to void and only complaints of pain is in abdomen due to constipation. Pt desiring shower. He transferred via STEADY from toilet to tub bench with assist for controlled descent to sit. He bathed seated with distant supervision. STEADY used for transfer out of shower. He dressed seated in w/c at sink. Assist required for LB dressing due to management of catheter throughout pants and foot getting stuck in mesh of pants. He stood in STEADY with steadying assist, able to alternate UE support to pull pants up.  Completed grooming tasks from w/c level mod I. Pt left seated in w/c at end of session, all needs in reach and set-up with breakfast tray.   Therapy Documentation Precautions:  Precautions Precautions: Fall Precaution Comments: RLE hemiparesis Restrictions Weight Bearing Restrictions: No   See Function Navigator for Current Functional Status.   Therapy/Group: Individual Therapy  [name], Atha Muradyan C 06/03/2016, 7:08 AM

## 2016-06-03 NOTE — Progress Notes (Signed)
Pt A+O x 4, no c/o pain, BS + in all quads, abd is soft, non distended but firm,  more tenderness noted on left side vs right, no c/o of n/v during this shift, last BM noted 06/03/2016, will continue with plan of care.

## 2016-06-03 NOTE — Progress Notes (Signed)
Occupational Therapy Note  Patient Details  Name: Perry Morrison MRN: [id number] Date of Birth: 05/08/1961  Today's Date: 06/03/2016 OT Individual Time: 1000-1030 OT Individual Time Calculation (min): 30 min  and Today's Date: 06/03/2016 OT Missed Time: 30 Minutes Missed Time Reason: Other (comment) (toileting)  Pt denied pain Individual therapy  Pt resting in w/c upon arrival.  Initially engaged pt sit<>stand with STEADY with focus on weight shifts.  Pt requested to use toilet and transferred to toilet with STEADY. Pt required assistance to pull down pants. Pt missed 30 mins skilled OT services while using toilet.  Pt had been admin laxative earlier.    Perry Morrison, Perry Morrison [facility] 06/03/2016, 10:46 AM

## 2016-06-03 NOTE — Progress Notes (Signed)
End of shift assessment, pt states feeling better, some discomfort to left abdominal area more that right. Dulcolax supp given at 630am as ordered, pt to call when ready to go to [facility].  Foley with light yellow urine, encourage pt to continue pushing fluids as that will help with kidneys and bowel.  Verb understanding.

## 2016-06-03 NOTE — Progress Notes (Signed)
[facility]     PROGRESS NOTE    Subjective/Complaints:  Had a busy night. Bowels moved after enema .didn't sleep much   ROS: pt denies nausea, vomiting, diarrhea, cough, shortness of breath or chest pain   Objective: Vital Signs: Blood pressure (!) 143/88, pulse 66, temperature 98.1 F (36.7 C), temperature source Oral, resp. rate 18, height 6\' 1"  (1.854 m), weight 106 kg (233 lb 11 oz), SpO2 95 %. Dg Abd Portable 1v  Result Date: 06/02/2016 CLINICAL DATA:  56 year old male with distended abdomen. EXAM: PORTABLE ABDOMEN - 1 VIEW COMPARISON:  Radiograph dated 05/30/2016 FINDINGS: There is persistent gaseous distention of the colon with interval increase in the colonic diameter is since the prior radiograph. Moderate stool noted in the right colon. No dilated small bowel. No definite free air or radiopaque calculi. The soft tissues are grossly unremarkable. IMPRESSION: Persistent pancolonic gaseous distention with interval increase in the colonic diameter since the prior radiograph. No dilated small bowel loops identified. Electronically Signed   By: Elgie CollardArash  Radparvar M.D.   On: 06/02/2016 23:12    Recent Labs  06/02/16 1014  WBC 11.9*  HGB 14.1  HCT 40.9  PLT 207    Recent Labs  06/02/16 1014 06/03/16 0547  NA 134* 136  K 3.9 4.6  CL 99* 97*  GLUCOSE 147* 96  BUN 27* 27*  CREATININE 1.20 1.19  CALCIUM 8.8* 9.0   CBG (last 3)  No results for input(s): GLUCAP in the last 72 hours.  Wt Readings from Last 3 Encounters:  06/02/16 106 kg (233 lb 11 oz)  05/26/16 102.1 kg (225 lb)    Physical Exam:  HENT:  Head: Normocephalic and atraumatic.  Eyes: Conjunctivae and EOM are normal. Left eye exhibits no discharge.  Neck: Normal range of motion. Neck supple. No thyromegaly present.  Cardiovascular: RRR.   Respiratory: CTA B  GI: Soft. Bowel sounds are normal. He exhibits no distension.  Genitourinary:  Genitourinary  Foley tube  remains  in place  Musculoskeletal: He exhibits no edema or tenderness.  Neurological:  Neurological: He is alert and oriented to person, place, and time.  Pt alert and appropriate.  B/l UE 5/5.  RLE: 1/5 HF, 0/5 elsewhere--no change LLE: 2+/5 HF, 3- KE and 3/5 ADF/PF.  Sensation intact to light touch throughout DTRs 1+ LLE.   Skin: Skin remains warm and dry.    Assessment/Plan: 1. Functional and mobility deficits secondary to thoracic myelopathy/transverse myelitis which require 3+ hours per day of interdisciplinary therapy in a comprehensive inpatient rehab setting. Physiatrist is providing close team supervision and 24 hour management of active medical problems listed below. Physiatrist and rehab team continue to assess barriers to discharge/monitor patient progress toward functional and medical goals.  Function:  Bathing Bathing position   Position: Shower  Bathing parts Body parts bathed by patient: Right arm, Right upper leg, Left arm, Left upper leg, Chest, Front perineal area, Right lower leg, Abdomen, Left lower leg, Buttocks, Back    Bathing assist Assist Level: Supervision or verbal cues      Upper Body Dressing/Undressing Upper body dressing   What is the patient wearing?: Pull over shirt/dress     Pull over shirt/dress - Perfomed by patient: Thread/unthread right sleeve, Thread/unthread left sleeve, Put head through opening, Pull shirt over trunk          Upper body assist Assist Level: More than reasonable time   Set up : To obtain clothing/put away  Lower Body Dressing/Undressing Lower body dressing   What is the patient wearing?: Pants, Shoes, [employer] - Performed by patient: Thread/unthread left underwear leg, Pull underwear up/down Underwear - Performed by helper: Thread/unthread right underwear leg Pants- Performed by patient: Pull pants up/down Pants- Performed by helper: Thread/unthread right pants leg, Thread/unthread left pants leg          Shoes - Performed by patient: Fasten right, Fasten left Shoes - Performed by helper: Don/doff right shoe, Don/doff left shoe       TED Hose - Performed by helper: Don/doff right TED hose, Don/doff left TED hose  Lower body assist Assist for lower body dressing: Touching or steadying assistance (Pt > 75%) (Standing in STEADY)      Toileting Toileting   Toileting steps completed by patient: Performs perineal hygiene, Adjust clothing after toileting Toileting steps completed by helper: Adjust clothing prior to toileting, Adjust clothing after toileting Toileting Assistive Devices: Grab bar or rail, Other (comment) (raised seat, stedy to get to toilet 2 assist)  Toileting assist Assist level: More than reasonable time, Set up/obtain supplies, Two helpers   Transfers Chair/bed transfer Chair/bed transfer activity did not occur: N/A Chair/bed transfer method: Stand pivot Chair/bed transfer assist level: 2 helpers Chair/bed transfer assistive device: Environmental consultant, Designer, fashion/clothing     Max distance: 23' Assist level: 2 helpers   Wheelchair   Type: Manual Max wheelchair distance: 150' Assist Level: Supervision or verbal cues  Cognition Comprehension Comprehension assist level: Follows complex conversation/direction with no assist  Expression Expression assist level: Expresses complex ideas: With no assist  Social Interaction Social Interaction assist level: Interacts appropriately with others - No medications needed.  Problem Solving Problem solving assist level: Solves complex problems: Recognizes & self-corrects  Memory Memory assist level: Complete Independence: No helper   Medical Problem List and Plan: 1.  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   -begin CIR therapies             I -prednisone 60 mg daily 2 doses   -reviewed etiology of TM with patient  today 2.  DVT Prophylaxis/Anticoagulation: SCDs. Vascular study negative  -begin q12 lovenox 3. Pain Management: Hydrocodone as needed 4. Hypertension. Norvasc 10 mg daily Monitor when out of bed 5. Neuropsych: This patient is capable of making decisions on his own behalf. 6. Skin/Wound Care: Routine skin checks 7. Fluids/Electrolytes/Nutrition:encourage PO  -BUN/Cr elevated---stable today  -push fluids and continue to follow serial labs 8.CRI stage III. Follow-up chemistries 9. Suspect neurogenic bowel bladder.  -dc foley today---voiding trial.   -establishing bowel program  LOS (Days) 2 A FACE TO FACE EVALUATION WAS PERFORMED  [name], MD 06/03/2016 8:55 AM

## 2016-06-03 NOTE — Progress Notes (Signed)
Mathis Farealled Dan, informed order to remove foley is dated today and care instructions which include to d/c are for tomorrow.  New order to modify to d/c foley today.

## 2016-06-03 NOTE — Progress Notes (Signed)
Physical Therapy Session Note  Patient Details  Name: Perry Morrison MRN: [id number] Date of Birth: [birth year]  Today's Date: 06/03/2016 PT Individual Time: 1600-1705 PT Individual Time Calculation (min): 65 min   Short Term Goals: Week 1:  PT Short Term Goal 1 (Week 1): Pt will complete basic transfers with min assist PT Short Term Goal 2 (Week 1): Pt will be able to gait x 25' with LRAD with max of 1 (+2 for w/c follow/safety) PT Short Term Goal 3 (Week 1): Pt will be able to negotiate 1 step with mod assist PT Short Term Goal 4 (Week 1): Pt will be able to manage w/c parts to set up for transfers with supervision cues  Skilled Therapeutic Interventions/Progress Updates:      Pt received sitting EOB  bed and agreeable to PT. PT assisted patient to don socks with max assist for time management   Pt noted to have incontinent bladder movement.   Sit<>stand at [facility] to Probation officer and personal hygiene. Continued bladder evacuation in standing at stedy. No control from patient to stop flow of urin   Gait training at rail in hall with Max assist from PT 71f. PT required to assist patient with R LE limb advancement, pelvic rotation, and postural control.    Squat pivot [facility] with min cues for UE placement  Sit<>supine with mod assist and mod cues for proper UE sequencing and of LE.   Supine NMR flexion extension in synergistic pattern with attempted SAQ. Bridges x 10,   [facility] returned to [facility] and [facility] and all needs met.      Therapy Documentation Precautions:  Precautions Precautions: Fall Precaution Comments: RLE hemiparesis Restrictions Weight Bearing Restrictions: No   Pain:   0/10   See Function Navigator for Current Functional Status.   Therapy/Group: Individual Therapy  [name]1/24/2018, 5:36 PM

## 2016-06-03 NOTE — Patient Care Conference (Signed)
Inpatient RehabilitationTeam Conference and Plan of Care Update Date: 06/02/2016   Time: 2:20 PM    Patient Name: Perry Morrison      Medical Record Number: [id number]  Date of Birth: 04/08/1961 Sex: Male         Room/Bed: 4W02C/4W02C-01 Payor Info: Payor: /    Admitting Diagnosis: Thoracie Myelopathy  Admit Date/Time:  06/01/2016  4:11 PM Admission Comments: No comment available   Primary Diagnosis:  <principal problem not specified> Principal Problem: <principal problem not specified>  Patient Active Problem List   Diagnosis Date Noted  . 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 Weakness of right leg   . Neurogenic bowel   . Neurogenic bladder   . Incomplete paraplegia (HCC)   . GI bleed 05/30/2016  . Hypertension 05/26/2016  . Constipation 05/26/2016  . Abnormal MRI, thoracic spine 05/26/2016  . Myelitis (HCC)   . Right leg weakness   . Urinary retention   . AKI (acute kidney injury) (HCC)   . Aneurysm (HCC)   . CKD (chronic kidney disease), stage III     Expected Discharge Date: Expected Discharge Date: 06/23/16  Team Members Present: Physician leading conference: Dr. Faith RogueZachary Swartz Nurse Present: Chana Bodeeborah Sharp, RN PT Present: Karolee StampsAlison Gray, Marcene BrawnPT;Elizabeth Tygielski, PT OT Present: Johnsie CancelAmy [name], OT PPS Coordinator present : Tora DuckMarie Noel, RN, CRRN     Current Status/Progress Goal Weekly Team Focus  Medical   thoracic myelopathy due to likely transvere myelitis. steroid rx  improve functional use of both legs  right leg strength, steroid rx, education   Bowel/Bladder   Constipation with bowel medications being administered; LBM 06/03/2016, foley d/c today  Continent and regular bowel movements continent and voiding without assistance  continue plan of care   Swallow/Nutrition/ Hydration             ADL's   Min A squat pivots; mod A sit > stand with blocking of R knee; max A LB dressing and toileting; Set-up UB dressing/ bathing and grooming  Supervision- mod I overall; min A  shower transfers  Standing balance/ tolerance; functional transfers; ADL re-training.    Mobility   mod assist transfers; max +2 for gait/stairs; supervision w/c mobility  mod I w/c level; supervision to min assist for short distance gait; min assist stairs   neuro re-ed RLE, transfers, endurance, standing balance, sit <> stands, gait, stairs, w/c parts management   Communication             Safety/Cognition/ Behavioral Observations            Pain   no c/o pain  less than 2  continue plan of care   Skin   skin is intact  skin to remain intact  continue plan of care    Rehab Goals Patient on target to meet rehab goals: Yes *See Care Plan and progress notes for long and short-term goals.  Barriers to Discharge: profound weakness of right leg    Possible Resolutions to Barriers:  orthotics, NMR, may see response to steroid rx    Discharge Planning/Teaching Needs:  Pt to d/c home either with roomate or family dependent on assist needs.      Team Discussion:  New eval; transverse myelitis.  Currently min/ mod assist with squat-pivot tfs.  Leg weakness.  +2 total for ambulation.  Supervision - mod independent goals overall.  Revisions to Treatment Plan:  None   Continued Need for Acute Rehabilitation Level of Care: The patient requires daily medical management by a  physician with specialized training in physical medicine and rehabilitation for the following conditions: Daily direction of a multidisciplinary physical rehabilitation program to ensure safe treatment while eliciting the highest outcome that is of practical value to the patient.: Yes Daily medical management of patient stability for increased activity during participation in an intensive rehabilitation regime.: Yes Daily analysis of laboratory values and/or radiology reports with any subsequent need for medication adjustment of medical intervention for : Post surgical problems;Neurological problems  Thaison Kolodziejski,  Aldora Perman 06/03/2016, 7:08 PM

## 2016-06-03 NOTE — Progress Notes (Signed)
Pt had KUB, Dan, PA was called at [phone] with the results, order rec'd for fleets, fleets given and pt expelled large amts of flatus, explosive sounding, pt states small amount of stool but pt wanted to sit on toilet for a while, had a very small amt of blood on washcloth when wiping from hemorrhoid, pt back to bed with stedy, 2 assist, pt states relief from distention, abdomen is now soft, mild distension, pt denies need for pain medication, states feeling much better,  encouraged po fluids, pt drinking water now.

## 2016-06-03 NOTE — Progress Notes (Signed)
Physical Therapy Session Note  Patient Details  Name: Perry Morrison MRN: [id number] Date of Birth: 06/02/1960  Today's Date: 06/03/2016 PT Individual Time: 0900-1000 PT Individual Time Calculation (min): 60 min   Short Term Goals: Week 1:  PT Short Term Goal 1 (Week 1): Pt will complete basic transfers with min assist PT Short Term Goal 2 (Week 1): Pt will be able to gait x 25' with LRAD with max of 1 (+2 for w/c follow/safety) PT Short Term Goal 3 (Week 1): Pt will be able to negotiate 1 step with mod assist PT Short Term Goal 4 (Week 1): Pt will be able to manage w/c parts to set up for transfers with supervision cues  Skilled Therapeutic Interventions/Progress Updates: Pt received seated in w/c, denies pain and agreeable to treatment. W/c propulsion x150' with BUE for strengthening and endurance before fatigued. Squat pivot transfer x4 during session with minA to improve hip clearance over wheel. Sit <>stand from elevated mat table with modA for RLE knee control; x3 reps with standing tolerance of 2 min each; weight shifting in stance for improved RLE knee control and weight bearing. Gait x25' with L rail in hall and maxA for RLE progression and stance control, +2 w/c follow for safety. Standing LLE toe taps forward/backward for focus on weight shifting, stance control; maxA for balance and cueing/facilitation at R knee. Sit <>stand in stedy with standbyA. 2x8 reps partial sit <>stand from stedy seat with close S and cues for decreased reliance on UEs. Nustep x6 min total with BLE only, level 1 with RLE alignment attachment d/t significant strength deficits. Returned to room totalA for energy conservation. Remained seated in w/c at end of session, all needs in reach.      Therapy Documentation Precautions:  Precautions Precautions: Fall Precaution Comments: RLE hemiparesis Restrictions Weight Bearing Restrictions: No   See Function Navigator for Current Functional  Status.   Therapy/Group: Individual Therapy  [name] 06/03/2016, 10:10 AM

## 2016-06-04 ENCOUNTER — Inpatient Hospital Stay (HOSPITAL_COMMUNITY): Payer: Self-pay | Admitting: Occupational Therapy

## 2016-06-04 ENCOUNTER — Inpatient Hospital Stay (HOSPITAL_COMMUNITY): Payer: Medicaid Other | Admitting: Physical Therapy

## 2016-06-04 ENCOUNTER — Inpatient Hospital Stay (HOSPITAL_COMMUNITY): Payer: Self-pay | Admitting: Physical Therapy

## 2016-06-04 MED ORDER — BETHANECHOL CHLORIDE 10 MG PO TABS
10.0000 mg | ORAL_TABLET | Freq: Three times a day (TID) | ORAL | Status: DC
  Administered 2016-06-04 – 2016-06-05 (×4): 10 mg via ORAL
  Filled 2016-06-04 (×3): qty 1

## 2016-06-04 MED ORDER — ALPRAZOLAM 0.25 MG PO TABS
0.2500 mg | ORAL_TABLET | Freq: Three times a day (TID) | ORAL | Status: DC | PRN
  Administered 2016-06-07 – 2016-06-23 (×4): 0.25 mg via ORAL
  Filled 2016-06-04 (×6): qty 1

## 2016-06-04 MED ORDER — POLYETHYLENE GLYCOL 3350 17 G PO PACK
17.0000 g | PACK | Freq: Two times a day (BID) | ORAL | Status: DC
  Administered 2016-06-05 – 2016-06-14 (×15): 17 g via ORAL
  Filled 2016-06-04 (×21): qty 1

## 2016-06-04 MED ORDER — SORBITOL 70 % SOLN
60.0000 mL | Status: AC
  Administered 2016-06-04: 60 mL via ORAL
  Filled 2016-06-04: qty 60

## 2016-06-04 MED ORDER — POLYETHYLENE GLYCOL 3350 17 G PO PACK
17.0000 g | PACK | ORAL | Status: AC
  Administered 2016-06-04 (×2): 17 g via ORAL
  Filled 2016-06-04 (×2): qty 1

## 2016-06-04 NOTE — Progress Notes (Signed)
Occupational Therapy Session Note  Patient Details  Name: Perry Morrison MRN: [id number] Date of Birth: 07/14/1960  Today's Date: 06/04/2016 OT Individual Time: 1300-1400 OT Individual Time Calculation (min): 60 min    Short Term Goals: Week 1:  OT Short Term Goal 1 (Week 1): Pt will complete LB dressing sit <> stand with min A using AE PRN OT Short Term Goal 2 (Week 1): Pt will transfer w/c <> toilet with min A OT Short Term Goal 3 (Week 1): Pt will complete 1 grooming task in standing to increase standing tolerance for ADLs OT Short Term Goal 4 (Week 1): Pt will manage w/c parts independently during functional tasks/ transfers  Skilled Therapeutic Interventions/Progress Updates:   Pt seen for OT session focusing on LE strengthening/ activity tolerance and functional standing balance/ tolerance. Pt sitting up in recliner upon arrival, agreeable to tx session.  Throughout session, transfers completed via squat pivot with guarding assist.  In therapy gym, pt stood from therapy mat with min A to RW with therapist blocking R knee. Pt able to alternate UE support to remove clothes pins from around waistband in simulation of LB dressing tasks. Completed x2 trials with seated rest break provided btwn trials.  Pt then transferred onto [employer] machine to complete weight shift activity with B UE support. Pt weightbearing primarily through L LE, pt voiced not "trusting" R knee/ leg. Blocking provided on R knee during stand. Seated in w/c, completed SAQ supported on soccer ball. Pt able to complete full ROM on L LE with stabilizing assist at knee and ankle. Pt required total A for R knee flexion/extension on soccer ball. Pt returned to room at end of session. Min A squat pivot [facility]. Left on toilet with RN present.   Therapy Documentation Precautions:  Precautions Precautions: Fall Precaution Comments: RLE hemiparesis Restrictions Weight  Bearing Restrictions: No Pain:    See Function Navigator for Current Functional Status.   Therapy/Group: Individual Therapy  [name], Omer Monter C 06/04/2016, 7:16 AM

## 2016-06-04 NOTE — Progress Notes (Signed)
Physical Therapy Session Note  Patient Details  Name: Perry Morrison MRN: [id number] Date of Birth: 11/20/1960  Today's Date: 06/04/2016 PT Individual Time: 0800-0900 and 1515-1600 PT Individual Time Calculation (min): 60 min and 45 min (total 105 min)   Short Term Goals: Week 1:  PT Short Term Goal 1 (Week 1): Pt will complete basic transfers with min assist PT Short Term Goal 2 (Week 1): Pt will be able to gait x 25' with LRAD with max of 1 (+2 for w/c follow/safety) PT Short Term Goal 3 (Week 1): Pt will be able to negotiate 1 step with mod assist PT Short Term Goal 4 (Week 1): Pt will be able to manage w/c parts to set up for transfers with supervision cues  Skilled Therapeutic Interventions/Progress Updates: Tx 1: Pt received seated in bed, denies pain and agreeable to treatment. Supine>sit with HOB elevated and bedrails with modI. Seated on EOB, dons shirt setupA. Pt threads pants with S, sit <>stand with modA and RW. Upon standing, pt attempting to pull up pants with LUE, then when pants got stuck at hips, pt removed RUE from RW as well and despite therapist assist at R knee, B knees buckled and required totalA to safely return to sitting on EOB. Discussed with pt importance of maintaining at least one UE on RW at all times until LEs stronger; pt verbalizes understanding. Attempted sit <>stand with RW a second time; pt alternates UEs to pull up pants, modA for balance and R knee control. Stand pivot transfer bed>w/c with RW and modA for RLE stability however improving foot clearance without assist. Seated at sink in w/c, performs hygiene with modI. Pt crossed legs to apply lotion to B feet; required assist to maintain LLE on opposite knee d/t hip ROM limitations, able to maintain RLE without assist. TEDs donned totalA. Dons B shoes with setupA and increased time for problem solving technique. W/c propulsion with S x150' for UE strengthening and endurance. Pt profusely sweating upon arrival to gym  after w/c propulsion; BP assessed 127/76 HR 86bpm. Discussed possible implications of AD with pt, although do not suspect AD at this time as vitals WNL and recovered within a few minutes rest break. Gait x30' with RW, RLE GRAFO and maxA for RLE progression and stance control, +2 w/c follow for safety. Stairs ascent/descent bottom stair 3' height with B handrails and maxA. Therapist provided maxA for progression/placement of RLE. Remained seated in w/c at end of session, all needs in reach.   Tx 2: Pt received seated in bed, denies pain and agreeable to treatment. Performed e-stim to R quads and anterior tib 10 sec on/15 sec off; note activation of quad however no ROM in supine short arc quad, minimal movement in short sitting on EOB. Pt with poor sensation in LE; encouraged to assist with knee extension while e-stim activated, however no additional range noted. No contraction noted in anterior tib. Skin before/after WNL. Supine>sit with S and increased time. Sit >supine minA for RLE management. Scooting towards HOB with BUEs and bedrails. Remained supine in bed at end of session, all needs in reach.      Therapy Documentation Precautions:  Precautions Precautions: Fall Precaution Comments: RLE hemiparesis Restrictions Weight Bearing Restrictions: No   See Function Navigator for Current Functional Status.   Therapy/Group: Individual Therapy  [name] 06/04/2016, 8:54 AM

## 2016-06-04 NOTE — IPOC Note (Signed)
Overall Plan of Care [facility](IPOC) Patient Details Name: Perry GraffJamie Morrison MRN: [id number] DOB: 03/11/1961  Admitting Diagnosis: Thoracie Myelopathy  Hospital Problems: Principal Problem:   Incomplete paraplegia (HCC) Active Problems:   CKD (chronic kidney disease), stage III   Neurogenic bowel   Neurogenic bladder   Thoracic myelopathy     Functional Problem List: Nursing Bladder, Bowel, Edema, Endurance, Medication Management, Motor, Nutrition, Pain, Safety, Sensory, Skin Integrity  PT Balance, Endurance, Motor, Sensory, Pain  OT Balance, Edema, Endurance, Safety, Motor, Sensory  SLP    TR         Basic ADL's: OT Bathing, Dressing, Toileting     Advanced  ADL's: OT Simple Meal Preparation     Transfers: PT Bed Mobility, Bed to Chair, Car, Occupational psychologisturniture  OT Toilet, Research scientist (life sciences)Tub/Shower     Locomotion: PT Ambulation, Psychologist, prison and probation servicesWheelchair Mobility, Stairs     Additional Impairments: OT None  SLP        TR      Anticipated Outcomes Item Anticipated Outcome  Self Feeding Independent  Swallowing      Basic self-care  Mod I  Toileting  Mod I   Bathroom Transfers Min A  Bowel/Bladder  continent of bowel/Foley cath  Transfers  mod I w/c level basic transfers; supervision car transfer  Locomotion  mod I w/c mobility; supervision to min assist gait; min assist stairs  Communication     Cognition     Pain  less<3  Safety/Judgment  mod/indip   Therapy Plan: PT Intensity: Minimum of 1-2 x/day ,45 to 90 minutes PT Frequency: 5 out of 7 days PT Duration Estimated Length of Stay: 17-21 days OT Intensity: Minimum of 1-2 x/day, 45 to 90 minutes OT Frequency: 5 out of 7 days OT Duration/Estimated Length of Stay: 2.5- 3 weeks         Team Interventions: Nursing Interventions Patient/Family Education, Bladder Management, Bowel Management, Disease Management/Prevention, Pain Management, Medication Management, Skin Care/Wound Management, Discharge Planning, Psychosocial Support  PT  interventions Ambulation/gait training, Warden/rangerBalance/vestibular training, Community reintegration, Discharge planning, Disease management/prevention, DME/adaptive equipment instruction, Functional electrical stimulation, Functional mobility training, Neuromuscular re-education, Pain management, Patient/family education, Psychosocial support, Skin care/wound management, Splinting/orthotics, Stair training, Therapeutic Activities, Therapeutic Exercise, UE/LE Strength taining/ROM, UE/LE Coordination activities, Wheelchair propulsion/positioning  OT Interventions Warden/rangerBalance/vestibular training, Discharge planning, Community reintegration, Fish farm managerDME/adaptive equipment instruction, Functional mobility training, Neuromuscular re-education, Psychosocial support, Patient/family education, Self Care/advanced ADL retraining, Therapeutic Activities, Splinting/orthotics, Therapeutic Exercise, UE/LE Strength taining/ROM, UE/LE Coordination activities  SLP Interventions    TR Interventions    SW/CM Interventions Discharge Planning, Psychosocial Support, Patient/Family Education    Team Discharge Planning: Destination: PT-Home ,OT- Home , SLP-  Projected Follow-up: PT-Home health PT, OT-  Home health OT, SLP-  Projected Equipment Needs: PT-Wheelchair cushion (measurements), Wheelchair (measurements), Rolling walker with 5" wheels, OT- To be determined, SLP-  Equipment Details: PT- , OT-  Patient/family involved in discharge planning: PT- Patient,  OT-Patient, SLP-   MD ELOS: 17-21 days Medical Rehab Prognosis:  Excellent Assessment: The patient has been admitted for CIR therapies with the diagnosis of likely transverse myelitis, thoracic myelopathy. The team will be addressing functional mobility, strength, stamina, balance, safety, adaptive techniques and equipment, self-care, bowel and bladder mgt, patient and caregiver education, NMR, pain control, orthotics, w/c assessment, ego support. Goals have been set at mod I for basic  mobility and self-care at w/c level. Pt is motivated.    Ranelle OysterZachary T. Swartz, MD, [facility] HospitalFAAPMR      See Team Conference Notes for weekly  updates to the plan of care

## 2016-06-04 NOTE — Progress Notes (Signed)
Physical Therapy Session Note  Patient Details  Name: Perry GraffJamie Perales MRN: [id number] Date of Birth: 11/18/1960  Today's Date: 06/04/2016 PT Individual Time: [id number] PT Individual Time Calculation (min): 79 min   Short Term Goals: Week 1:  PT Short Term Goal 1 (Week 1): Pt will complete basic transfers with min assist PT Short Term Goal 2 (Week 1): Pt will be able to gait x 25' with LRAD with max of 1 (+2 for w/c follow/safety) PT Short Term Goal 3 (Week 1): Pt will be able to negotiate 1 step with mod assist PT Short Term Goal 4 (Week 1): Pt will be able to manage w/c parts to set up for transfers with supervision cues  Skilled Therapeutic Interventions/Progress Updates:  Pt received in w/c & agreeable to tx, denying c/o pain. Pt propelled w/c throughout unit with BUE & supervision for cardiopulmonary endurance training. Pt completes squat pivot transfers w/c<>mat table with steady assist for safety & balance. Pt transferred sit<>stand with min assist & tolerated standing x 30 seconds + 1 minute 54 seconds + 1 minute 10 seconds with therapist providing tactile cuing at L knee to assist with terminal knee extension & prevent buckling; activity focused on standing tolerance. Pt able to weight bear through RLE enough to lift LLE off of ground. Pt completed stand pivot w/c>kinetron with +2 assist for safety, but then able to complete stand pivot kinetron>w/c with +1 assist with therapist assisting with weight shifting and blocking at R knee.. Pt utilized cybex kinetron on lowest resistance level in sitting with task focusing on BLE strengthening and neuromuscular re-education. Pt completed car transfer at simulated sedan height via squat pivot with min assist and cuing for sequencing. At end of session pt requested to use bathroom. Pt utilized stedy lift to transfer to elevated seat over toilet with therapist providing total assist for clothing management. At end of session pt left on commode with call  bell within reach & instructions to call for assistance when finished; RN made aware of pt's position.  Pt reported numbness in RLE & RN made aware.  Pt required frequent rest breaks throughout session 2/2 fatigue.  Pt noted pain in back 2/2 backrest on w/c, therapist place pillow for increased comfort.      Therapy Documentation Precautions:  Precautions Precautions: Fall Precaution Comments: RLE hemiparesis Restrictions Weight Bearing Restrictions: No   General: PT Amount of Missed Time (min): 11 Minutes PT Missed Treatment Reason:  (fatigue, toileting)   See Function Navigator for Current Functional Status.   Therapy/Group: Individual Therapy  Sandi MariscalVictoria M Ashlee Bewley 06/04/2016, 10:46 AM

## 2016-06-04 NOTE — Progress Notes (Signed)
[place] PHYSICAL MEDICINE & REHABILITATION     PROGRESS NOTE    Subjective/Complaints:  RN reports the patient had "dizziness" last night. Patient did not report any problems other than being unable to void.   ROS: pt denies nausea, vomiting, diarrhea, cough, shortness of breath or chest pain  Objective: Vital Signs: Blood pressure 126/83, pulse 65, temperature 98.6 F (37 C), temperature source Oral, resp. rate 18, height 6\' 1"  (1.854 m), weight 106 kg (233 lb 11 oz), SpO2 98 %. Dg Abd Portable 1v  Result Date: 06/02/2016 CLINICAL DATA:  56 year old male with distended abdomen. EXAM: PORTABLE ABDOMEN - 1 VIEW COMPARISON:  Radiograph dated 05/30/2016 FINDINGS: There is persistent gaseous distention of the colon with interval increase in the colonic diameter is since the prior radiograph. Moderate stool noted in the right colon. No dilated small bowel. No definite free air or radiopaque calculi. The soft tissues are grossly unremarkable. IMPRESSION: Persistent pancolonic gaseous distention with interval increase in the colonic diameter since the prior radiograph. No dilated small bowel loops identified. Electronically Signed   By: Elgie CollardArash  Radparvar M.D.   On: 06/02/2016 23:12    Recent Labs  06/02/16 1014  WBC 11.9*  HGB 14.1  HCT 40.9  PLT 207    Recent Labs  06/02/16 1014 06/03/16 0547  NA 134* 136  K 3.9 4.6  CL 99* 97*  GLUCOSE 147* 96  BUN 27* 27*  CREATININE 1.20 1.19  CALCIUM 8.8* 9.0   CBG (last 3)  No results for input(s): GLUCAP in the last 72 hours.  Wt Readings from Last 3 Encounters:  06/02/16 106 kg (233 lb 11 oz)  05/26/16 102.1 kg (225 lb)    Physical Exam:  HENT:  Head: Normocephalic and atraumatic.  Eyes: Conjunctivae and EOM are normal. Left eye exhibits no discharge.  Neck: Normal range of motion. Neck supple. No thyromegaly present.  Cardiovascular: RRR.   Respiratory: CTA B  GI: Soft. Bowel sounds are normal. He exhibits no distension.   Genitourinary:  Genitourinary foley out  Musculoskeletal: He exhibits no edema or tenderness.  Neurological:  Neurological: He is alert and oriented to person, place, and time.  Pt alert and appropriate.  B/l UE 5/5.  RLE: still with 1/5 HF, 0/5 elsewhere--no change LLE: 2+/5 HF, 3- KE and 3/5 ADF/PF.  Sensation intact to light touch throughout DTRs 1+ LLE.   Skin: Skin remains warm and dry.    Assessment/Plan: 1. Functional and mobility deficits secondary to thoracic myelopathy/transverse myelitis which require 3+ hours per day of interdisciplinary therapy in a comprehensive inpatient rehab setting. Physiatrist is providing close team supervision and 24 hour management of active medical problems listed below. Physiatrist and rehab team continue to assess barriers to discharge/monitor patient progress toward functional and medical goals.  Function:  Bathing Bathing position   Position: Shower  Bathing parts Body parts bathed by patient: Right arm, Right upper leg, Left arm, Left upper leg, Chest, Front perineal area, Right lower leg, Abdomen, Left lower leg, Buttocks, Back    Bathing assist Assist Level: Supervision or verbal cues      Upper Body Dressing/Undressing Upper body dressing   What is the patient wearing?: Pull over shirt/dress     Pull over shirt/dress - Perfomed by patient: Thread/unthread right sleeve, Thread/unthread left sleeve, Put head through opening, Pull shirt over trunk          Upper body assist Assist Level: No help, No cues   Set up :  To obtain clothing/put away  Lower Body Dressing/Undressing Lower body dressing   What is the patient wearing?: Pants, [employer], Shoes Underwear - Performed by patient: Thread/unthread left underwear leg, Pull underwear up/down Underwear - Performed by helper: Thread/unthread right underwear leg Pants- Performed by patient: Thread/unthread right pants leg, Thread/unthread left pants leg, Pull pants  up/down Pants- Performed by helper: Thread/unthread right pants leg, Thread/unthread left pants leg         Shoes - Performed by patient: Fasten right, Fasten left Shoes - Performed by helper: Don/doff right shoe, Don/doff left shoe       TED Hose - Performed by helper: Don/doff right TED hose, Don/doff left TED hose  Lower body assist Assist for lower body dressing: Touching or steadying assistance (Pt > 75%)      Toileting Toileting   Toileting steps completed by patient: Performs perineal hygiene, Adjust clothing after toileting Toileting steps completed by helper: Adjust clothing prior to toileting, Adjust clothing after toileting Toileting Assistive Devices: Grab bar or rail, Other (comment)  Toileting assist Assist level: Two helpers   Transfers Chair/bed transfer Chair/bed transfer activity did not occur: N/A Chair/bed transfer method: Stand pivot Chair/bed transfer assist level: Moderate assist (Pt 50 - 74%/lift or lower) Chair/bed transfer assistive device: Walker, Designer, fashion/clothing     Max distance: 25 Assist level: 2 helpers   Wheelchair   Type: Manual Max wheelchair distance: 150' Assist Level: Supervision or verbal cues  Cognition Comprehension Comprehension assist level: Follows complex conversation/direction with no assist  Expression Expression assist level: Expresses complex ideas: With no assist  Social Interaction Social Interaction assist level: Interacts appropriately with others - No medications needed.  Problem Solving Problem solving assist level: Solves complex problems: Recognizes & self-corrects  Memory Memory assist level: Complete Independence: No helper   Medical Problem List and Plan: 1.  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   -begin CIR therapies             -prednisone 60 mg daily 2 doses completed  -reviewed potential  of SCI etiology today 2.  DVT Prophylaxis/Anticoagulation: SCDs. Vascular study negative  -begin q12 lovenox 3. Pain Management: Hydrocodone as needed 4. Hypertension. Norvasc 10 mg daily Monitor when out of bed 5. Neuropsych: This patient is capable of making decisions on his own behalf.  -dizziness may be have been anxiety related. See no other obvious causes at present 6. Skin/Wound Care: Routine skin checks 7. Fluids/Electrolytes/Nutrition:encourage PO  -BUN/Cr elevated on last lab testing  -push fluids and continue to follow serial labs 8.CRI stage III. Follow-up chemistries 9. Suspect neurogenic bowel bladder.  -continue voiding trial. No voids yets.    -add urecholine  -establishing bowel program   -needs bm---sorbitol,sse  LOS (Days) 3 A FACE TO FACE EVALUATION WAS PERFORMED  [name], MD 06/04/2016 8:23 AM

## 2016-06-04 NOTE — Progress Notes (Signed)
Social Work  Social Work Assessment and Plan  Patient Details  Name: Perry Morrison MRN: [id number] Date of Birth: [birth year]  Today's Date: 06/04/2016  Problem List:  Patient Active Problem List   Diagnosis Date Noted  . 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 Weakness of right leg   . Neurogenic bowel   . Neurogenic bladder   . Incomplete paraplegia (HCC)   . GI bleed 05/30/2016  . Hypertension 05/26/2016  . Constipation 05/26/2016  . Abnormal MRI, thoracic spine 05/26/2016  . Myelitis (HCC)   . Right leg weakness   . Urinary retention   . AKI (acute kidney injury) (HCC)   . Aneurysm (HCC)   . CKD (chronic kidney disease), stage III    Past Medical History:  Past Medical History:  Diagnosis Date  . AKI (acute kidney injury) (HCC)   . 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 Hypertension   . Myelitis (HCC)   . Right leg weakness   . Urinary retention    Past Surgical History:  Past Surgical History:  Procedure Laterality Date  . FRACTURE SURGERY Right 1986   Social History:  reports that he has never smoked. He has never used smokeless tobacco. He reports that he does not drink alcohol or use drugs.  Family / Support Systems Marital Status: Separated Patient Roles: Parent Other Supports: grandmother, Monico Blitz @ (C) [phone];  friend, Earley Brooke @ [phone];  friend, Malva Cogan  Anticipated Caregiver: grandmother Ability/Limitations of Caregiver: grandmother can only offer superivison Caregiver Availability: 24/7 Family Dynamics: Pt notes good relationship with his grandmother.  Separated from wife and does not anticipate any involvement from her.  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  Did discuss possible SSD application - pt agreeable Legal Hisotry/Current Legal Issues: none Guardian/Conservator: none - per MD, pt is capable of making decisions on his own behalf   Abuse/Neglect Physical Abuse: Denies Verbal Abuse: Denies Sexual Abuse: Denies Exploitation of patient/patient's resources: Denies Self-Neglect: Denies  Emotional Status Pt's affect, behavior adn adjustment status: Pt pleasant but not very talkative.  Somewhat difficult to engage but able to complete assessment interview.  When asked about adjustment to this situation/ mood, pt states, "I'm doing ok.  Trying to stay positive." Will monitor mood and refer for neuropsychology as indicated.   Recent Psychosocial Issues: He and wife separated in Aug 2017 Pyschiatric History: None Substance Abuse History: None  Patient / Family Perceptions, Expectations & Goals Pt/Family understanding of illness & functional limitations: Pt with basic understanding of his diagnosis, however, reports he has never heard of TM before.  General understanding of his physical limitations resulting and need for CIR.  Pt notes he is uncertain what to expect from longer term recovery/ Premorbid pt/family roles/activities: Completely independent and very active/ athletic Anticipated changes in roles/activities/participation: Pt with supervision goals overall.  Grandmother to provide primary support. Pt/family expectations/goals: "just to get my strength back"  [employer]: None Premorbid Home Care/DME Agencies: None Transportation available at discharge: yes Resource referrals recommended: Neuropsychology, Support group (specify)  Discharge Planning Living Arrangements: Non-relatives/Friends Support Systems: Other relatives, Friends/neighbors Type of Residence: Private residence Insurance Resources: Customer service manager Resources: Employment Financial Screen Referred: Previously completed Living  Expenses: Lives with  family Money Management: Patient Does the patient have any problems obtaining your medications?: Yes (Describe) (no insurance) Home Management: pt sharing responsibilities with friend Patient/Family Preliminary Plans: Pt plans to d/c to home with grandmother. Social Work Anticipated Follow Up Needs: HH/OP Expected length of stay: 15-22 days  Clinical Impression Pleasant gentleman here with transverse myelitis diagnosis.  Not very talkative but polite and completes assessment interview without difficulty.  Currently denies any significant emotional distress but will monitor and refer for neuropsychology consult as indicated.  Plans to d/c home with his grandmother as he is currently separated from his wife (since Aug).  Also, discussed probable need to begin SSD application and pt agreeable.  Will follow for support and d/c planning needs.  Jazzlynn Rawe 06/04/2016, 2:26 PM

## 2016-06-05 ENCOUNTER — Inpatient Hospital Stay (HOSPITAL_COMMUNITY): Payer: Self-pay

## 2016-06-05 ENCOUNTER — Inpatient Hospital Stay (HOSPITAL_COMMUNITY): Payer: Self-pay | Admitting: Occupational Therapy

## 2016-06-05 ENCOUNTER — Inpatient Hospital Stay (HOSPITAL_COMMUNITY): Payer: Medicaid Other | Admitting: Occupational Therapy

## 2016-06-05 ENCOUNTER — Inpatient Hospital Stay (HOSPITAL_COMMUNITY): Payer: Self-pay | Admitting: Physical Therapy

## 2016-06-05 MED ORDER — MAGNESIUM CITRATE PO SOLN
1.0000 | Freq: Once | ORAL | Status: AC
  Administered 2016-06-05: 1 via ORAL
  Filled 2016-06-05: qty 296

## 2016-06-05 MED ORDER — BETHANECHOL CHLORIDE 25 MG PO TABS
25.0000 mg | ORAL_TABLET | Freq: Three times a day (TID) | ORAL | Status: DC
  Administered 2016-06-05 – 2016-06-08 (×9): 25 mg via ORAL
  Filled 2016-06-05 (×10): qty 1

## 2016-06-05 NOTE — Progress Notes (Signed)
Physical Therapy Session Note  Patient Details  Name: Perry Morrison MRN: [id number] Date of Birth: 06/05/1960  Today's Date: 06/04/2016     Short Term Goals: Week 1:  PT Short Term Goal 1 (Week 1): Pt will complete basic transfers with min assist PT Short Term Goal 2 (Week 1): Pt will be able to gait x 25' with LRAD with max of 1 (+2 for w/c follow/safety) PT Short Term Goal 3 (Week 1): Pt will be able to negotiate 1 step with mod assist PT Short Term Goal 4 (Week 1): Pt will be able to manage w/c parts to set up for transfers with supervision cues   Skilled Therapeutic Interventions/Progress Updates:    Pt received supine in bed, asleep. He was aroused easily; Patient reports that he is exhausted from therapy to day and would like to rest for the rest of the day. PT will re-attempt at later days.     General:  Missed minutes: 30 ( pt fatigued)  Therapy/Group: Individual Therapy  [name] 06/05/2016, 5:56 AM

## 2016-06-05 NOTE — Progress Notes (Signed)
Occupational Therapy Session Note  Patient Details  Name: Perry GraffJamie Morrison MRN: [id number] Date of Birth: 03/02/1961  Today's Date: 06/05/2016 OT Individual Time: 1300-1400 OT Individual Time Calculation (min): 60 min    Short Term Goals: Week 1:  OT Short Term Goal 1 (Week 1): Pt will complete LB dressing sit <> stand with min A using AE PRN OT Short Term Goal 2 (Week 1): Pt will transfer w/c <> toilet with min A OT Short Term Goal 3 (Week 1): Pt will complete 1 grooming task in standing to increase standing tolerance for ADLs OT Short Term Goal 4 (Week 1): Pt will manage w/c parts independently during functional tasks/ transfers  Skilled Therapeutic Interventions/Progress Updates:    Pt seen for OT ADL bathing/dressing session. Pt in recliner upon arrival, ready for shower. Throughout session, completed squat pivot transfers with guarding assist. Pt bathed with distant supervision seated on tub bench with set-up. Increased assist needed for exiting shower to ensure full clearance of bottom during transfer. He completed grooming tasks seated in w/c at sink mod I. STEADY used to don brief, pt standing with B UE support while therapist donned brief. He donned pants seated in chair, stood with RW with therapist blocking R knee, pt able to let go of RW to pull pants up, alternating UE support. TEDs and socks donned total A.  He completed squat pivot transfer to recliner, emphasis on pt coming forward to complete clear surface during transfer. Pt left seated in recliner at end of session, friend entering as therapist exited. Reviewed safety plan with pt in light of near fall with nursing this A.M. Instructed pt on directing care and using STEADY for any standing tasks, squat pivots for basic transfers, pt voiced understanding.      Therapy Documentation Precautions:  Precautions Precautions: Fall Precaution Comments: RLE hemiparesis Restrictions Weight Bearing Restrictions: No Pain: Pain  Assessment Pain Assessment: No/denies pain  See Function Navigator for Current Functional Status.   Therapy/Group: Individual Therapy  [name], Qasim Diveley C 06/05/2016, 1:20 PM

## 2016-06-05 NOTE — Progress Notes (Signed)
[facility] PHYSICAL MEDICINE & REHABILITATION     PROGRESS NOTE    Subjective/Complaints:  Right leg gave way this morning during transfer. RN was able to control him back to chair  ROS: pt denies nausea, vomiting, diarrhea, cough, shortness of breath or chest pain   Objective: Vital Signs: Blood pressure 138/77, pulse 60, temperature 98.2 F (36.8 C), temperature source Oral, resp. rate 20, height 6\' 1"  (1.854 m), weight 106 kg (233 lb 11 oz), SpO2 99 %. No results found.  Recent Labs  06/02/16 1014  WBC 11.9*  HGB 14.1  HCT 40.9  PLT 207    Recent Labs  06/02/16 1014 06/03/16 0547  NA 134* 136  K 3.9 4.6  CL 99* 97*  GLUCOSE 147* 96  BUN 27* 27*  CREATININE 1.20 1.19  CALCIUM 8.8* 9.0   CBG (last 3)  No results for input(s): GLUCAP in the last 72 hours.  Wt Readings from Last 3 Encounters:  06/02/16 106 kg (233 lb 11 oz)  05/26/16 102.1 kg (225 lb)    Physical Exam:  HENT:  Head: Normocephalic and atraumatic.  Eyes: Conjunctivae and EOM are normal. Left eye exhibits no discharge.  Neck: Normal range of motion. Neck supple. No thyromegaly present.  Cardiovascular: RRR.   Respiratory: CTA B  GI: Soft. Bowel sounds are normal. He exhibits no distension.  Genitourinary: no changes  Musculoskeletal: He exhibits no edema or tenderness.  Right thigh non-tender Neurological:  Neurological: He is alert and oriented to person, place, and time.  Pt alert and appropriate.  B/l UE 5/5.  RLE: still with 1/5 HF, 0/5 elsewhere--stable LLE: 2+/5 HF, 3- KE and 3/5 ADF/PF.  Sensation intact to light touch throughout DTRs 1+ LLE.   Skin: Skin remains warm and dry.    Assessment/Plan: 1. Functional and mobility deficits secondary to thoracic myelopathy/transverse myelitis which require 3+ hours per day of interdisciplinary therapy in a comprehensive inpatient rehab setting. Physiatrist is providing close team supervision and 24 hour management of active medical  problems listed below. Physiatrist and rehab team continue to assess barriers to discharge/monitor patient progress toward functional and medical goals.  Function:  Bathing Bathing position   Position: Shower  Bathing parts Body parts bathed by patient: Right arm, Right upper leg, Left arm, Left upper leg, Chest, Front perineal area, Right lower leg, Abdomen, Left lower leg, Buttocks, Back    Bathing assist Assist Level: Supervision or verbal cues      Upper Body Dressing/Undressing Upper body dressing   What is the patient wearing?: Pull over shirt/dress     Pull over shirt/dress - Perfomed by patient: Thread/unthread right sleeve, Thread/unthread left sleeve, Put head through opening, Pull shirt over trunk          Upper body assist Assist Level: No help, No cues   Set up : To obtain clothing/put away  Lower Body Dressing/Undressing Lower body dressing   What is the patient wearing?: Pants, [employer], Shoes Underwear - Performed by patient: Thread/unthread left underwear leg, Pull underwear up/down Underwear - Performed by helper: Thread/unthread right underwear leg Pants- Performed by patient: Thread/unthread right pants leg, Thread/unthread left pants leg, Pull pants up/down Pants- Performed by helper: Thread/unthread right pants leg, Thread/unthread left pants leg         Shoes - Performed by patient: Fasten right, Fasten left Shoes - Performed by helper: Don/doff right shoe, Don/doff left shoe       TED Hose - Performed by helper:  Don/doff right TED hose, Don/doff left TED hose  Lower body assist Assist for lower body dressing: Touching or steadying assistance (Pt > 75%)      Toileting Toileting   Toileting steps completed by patient: Performs perineal hygiene, Adjust clothing after toileting Toileting steps completed by helper: Adjust clothing prior to toileting, Adjust clothing after toileting Toileting Assistive Devices: Grab bar or rail, Other (comment)   Toileting assist Assist level: Two helpers   Transfers Chair/bed transfer Chair/bed transfer activity did not occur: N/A Chair/bed transfer method: Squat pivot Chair/bed transfer assist level: Supervision or verbal cues Chair/bed transfer assistive device: Armrests     Locomotion Ambulation     Max distance: 30 Assist level: 2 helpers   Wheelchair   Type: Manual Max wheelchair distance: 175 Assist Level: Supervision or verbal cues  Cognition Comprehension Comprehension assist level: Follows complex conversation/direction with no assist  Expression Expression assist level: Expresses complex ideas: With no assist  Social Interaction Social Interaction assist level: Interacts appropriately with others - No medications needed.  Problem Solving Problem solving assist level: Solves complex problems: Recognizes & self-corrects  Memory Memory assist level: Complete Independence: No helper   Medical Problem List and Plan: 1.  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   -continue CIR therapies             -prednisone  completed    2.  DVT Prophylaxis/Anticoagulation: SCDs. Vascular study negative  -begin q12 lovenox 3. Pain Management: Hydrocodone as needed 4. Hypertension. Norvasc 10 mg daily Monitor when out of bed 5. Neuropsych: This patient is capable of making decisions on his own behalf.   6. Skin/Wound Care: Routine skin checks 7. Fluids/Electrolytes/Nutrition:encourage PO  -recent labs stable  8.CRI stage III. Follow-up chemistries next week 9. Suspect neurogenic bowel bladder.  -continue voiding trial. No voids yets.    - increase urecholine to 25mg  TID  -establishing bowel program--daily AM suppository   -needs bm---   -mag citrate today   -SSE if needed  LOS (Days) 4 A FACE TO FACE EVALUATION WAS PERFORMED  Ranelle OysterSWARTZ,Jamielynn Wigley T, MD 06/05/2016 9:39 AM

## 2016-06-05 NOTE — Progress Notes (Signed)
Results from lactulose this am was small BM per pt and unseen by staff.  Mag Citrate gvn but taking small amts.  Informed that needs to drink rest of medication and will allow additional time for results from Mag Citrate. BS are present on left upper and lower quad, right side is more hypoactive at this time, pt reports no tenderness or pain at this time.  ABD is soft and round, no distension noted.

## 2016-06-05 NOTE — Care Management Note (Signed)
[facility] Individual Statement of Services  Patient Name:  Perry GraffJamie Morrison  Date:  06/04/2016  Welcome to the [facility].  Our goal is to provide you with an individualized program based on your diagnosis and situation, designed to meet your specific needs.  With this comprehensive rehabilitation program, you will be expected to participate in at least 3 hours of rehabilitation therapies Monday-Friday, with modified therapy programming on the weekends.  Your rehabilitation program will include the following services:  Physical Therapy (PT), Occupational Therapy (OT), 24 hour per day rehabilitation nursing, Therapeutic Recreaction (TR), Neuropsychology, Case Management (Social Worker), Rehabilitation Medicine, Nutrition Services and Pharmacy Services  Weekly team conferences will be held on Tuesdays to discuss your progress.  Your Social Worker will talk with you frequently to get your input and to update you on team discussions.  Team conferences with you and your family in attendance may also be held.  Expected length of stay: 3 weeks  Overall anticipated outcome: supervision/ min assist  Depending on your progress and recovery, your program may change. Your Social Worker will coordinate services and will keep you informed of any changes. Your Social Worker's name and contact numbers are listed  below.  The following services may also be recommended but are not provided by the [facility]:   Driving Evaluations  Home Health Rehabiltiation Services  Outpatient Rehabilitation Services  Vocational Rehabilitation   Arrangements will be made to provide these services after discharge if needed.  Arrangements include referral to agencies that provide these services.  Your insurance has been verified to be:  None (Child psychotherapistsocial worker will assist with Medicaid and SSD applications) Your primary doctor is:  None  Pertinent information will  be shared with your doctor and your insurance company.  Social Worker:  [place]cy [place], [place] [phone] or ([phone]) [phone]   Information discussed with and copy given to patient by: Amada JupiterHOYLE, Perry Morrison, 06/04/2016, 2:27 PM

## 2016-06-05 NOTE — Progress Notes (Signed)
Occupational Therapy Session Note  Patient Details  Name: Perry GraffJamie Morrison MRN: [id number] Date of Birth: 11/25/1960  Today's Date: 06/05/2016 OT Individual Time: 0930-1040 OT Individual Time Calculation (min): 70 min    Short Term Goals: Week 1:  OT Short Term Goal 1 (Week 1): Pt will complete LB dressing sit <> stand with min A using AE PRN OT Short Term Goal 2 (Week 1): Pt will transfer w/c <> toilet with min A OT Short Term Goal 3 (Week 1): Pt will complete 1 grooming task in standing to increase standing tolerance for ADLs OT Short Term Goal 4 (Week 1): Pt will manage w/c parts independently during functional tasks/ transfers  Skilled Therapeutic Interventions/Progress Updates:    Pt seen this session to focus on functional transfers and developing strength needed for strong squat pivots and sit to stands. Pt received in room in w/c and stated he would prefer to shower for last session.  To prepare for that session, practiced "dry run" squat pivot transfers w/c to tub bench. Pt was able to transfer but was getting very little hip clearance to ensure that it would be a comfortable transfer without clothing on.  Recommended he continue to use the stedy until his hip height improves in the transfer. Pt self propelled to the gym and worked on squat pivot to the mat.  On mat, multiple repetitions of squat /scoots to left and right with emphasis on forward wt shift, arm push ups on push up blocks, A/arom of RLE on maxislide, a/arom of hip adduction. Pt requested to lie in prone for low back relief. Pillow placed under belly and pt resting until PT session to begin.   Therapy Documentation Precautions:  Precautions Precautions: Fall Precaution Comments: RLE hemiparesis Restrictions Weight Bearing Restrictions: No   Pain: Pain Assessment Pain Assessment: No/denies pain ADL:  See Function Navigator for Current Functional Status.   Therapy/Group: Individual  Therapy  SAGUIER,JULIA 06/05/2016, 10:38 AM

## 2016-06-05 NOTE — Progress Notes (Signed)
Physical Therapy Session Note  Patient Details  Name: Perry Morrison MRN: [id number] Date of Birth: 03/27/1961  Today's Date: 06/05/2016 PT Individual Time: 1045-1130 PT Individual Time Calculation (min): 45 min   Short Term Goals: Week 1:  PT Short Term Goal 1 (Week 1): Pt will complete basic transfers with min assist PT Short Term Goal 2 (Week 1): Pt will be able to gait x 25' with LRAD with max of 1 (+2 for w/c follow/safety) PT Short Term Goal 3 (Week 1): Pt will be able to negotiate 1 step with mod assist PT Short Term Goal 4 (Week 1): Pt will be able to manage w/c parts to set up for transfers with supervision cues  Skilled Therapeutic Interventions/Progress Updates:    Pt presents on mat in prone after hand off from OT. Pt reports relief in this position from prolonged stretch. Required min assist of RLE management to roll into supine and then to get to seated position EOB with cues for log roll technique. Pt reports increasing need to have BM so returned to room via total assist for time management after completed min assist squat pivot transfer into w/c. Pt reports decreased urgency upon return to room but wanted to make attempt. PT removed shoes per request and pt donned socks seated in w/c with min assist to stabilize LE in figure four position. Using [place], completed toilet transfers and standing balance with overall min assist for clothing management. End of session transferred to recliner with all needs in reach. Provided patient with handout of LE HEP with verbalization of exercises. Recommended to practice with PT this weekend and pt in agreement.   Therapy Documentation Precautions:  Precautions Precautions: Fall Precaution Comments: RLE hemiparesis Restrictions Weight Bearing Restrictions: No   Pain: Pain Assessment Pain Assessment: No/denies pain   See Function Navigator for Current Functional Status.   Therapy/Group: Individual Therapy  Karolee StampsGray, Adriannah Steinkamp Darrol PokeBrescia   Saket Hellstrom B. Derk Doubek, PT, DPT  06/05/2016, 12:09 PM

## 2016-06-05 NOTE — Progress Notes (Signed)
Physical Therapy Session Note  Patient Details  Name: Perry Morrison MRN: [id number] Date of Birth: 08/02/1960  Today's Date: 06/05/2016 PT Individual Time: 0800-0900 PT Individual Time Calculation (min): 60 min   Short Term Goals: Week 1:  PT Short Term Goal 1 (Week 1): Pt will complete basic transfers with min assist PT Short Term Goal 2 (Week 1): Pt will be able to gait x 25' with LRAD with max of 1 (+2 for w/c follow/safety) PT Short Term Goal 3 (Week 1): Pt will be able to negotiate 1 step with mod assist PT Short Term Goal 4 (Week 1): Pt will be able to manage w/c parts to set up for transfers with supervision cues  Skilled Therapeutic Interventions/Progress Updates: Pt received seated in recliner, denies pain and agreeable to treatment. Pt reports "heaviness" in RLE; educated regarding muscle pump to A with moving fluid out of LE against gravity, and lack of muscle pump currently available d/t injury. RLE wrapped in ace wrap to A with fluid pressure gradient. Performed sit <>stand in stedy x4 trials. Pt requesting to fix brief; ultimately decided to don new brief; pt pulls pants and brief off with standbyA, therapist donned clean brief, and pt pulled up pants standbyA in stedy. W/c propulsion x175' with BUE for strengthening and endurance, modI. Squat pivot transfer w/c <>mat table with close S. Sit <>stand x5 reps from edge of mat with RW and modA for RLE stance control. Cues for weight bearing through heels to reduce anterior bias and weight bearing through UEs. Sit >supine minA for RLE management. Supine<>prone with minA for RLE management. Prone>quadruped modA; cues in quadruped for core activation and stability with significant core strength deficits observed. 2 reps x1 min each. Quadruped>tall kneeling with BUE support on bench; modA to achieve position, min guard with cues for core activation to maintain position. Remained seated in w/c at end of session, all needs in reach.       Therapy Documentation Precautions:  Precautions Precautions: Fall Precaution Comments: RLE hemiparesis Restrictions Weight Bearing Restrictions: No   See Function Navigator for Current Functional Status.   Therapy/Group: Individual Therapy  [name] 06/05/2016, 9:05 AM

## 2016-06-06 ENCOUNTER — Inpatient Hospital Stay (HOSPITAL_COMMUNITY): Payer: Medicaid Other | Admitting: Occupational Therapy

## 2016-06-06 ENCOUNTER — Inpatient Hospital Stay (HOSPITAL_COMMUNITY): Payer: Self-pay | Admitting: Physical Therapy

## 2016-06-06 DIAGNOSIS — N319 Neuromuscular dysfunction of bladder, unspecified: Secondary | ICD-10-CM

## 2016-06-06 DIAGNOSIS — N183 Chronic kidney disease, stage 3 (moderate): Secondary | ICD-10-CM

## 2016-06-06 DIAGNOSIS — M4714 Other spondylosis with myelopathy, thoracic region: Secondary | ICD-10-CM

## 2016-06-06 DIAGNOSIS — K592 Neurogenic bowel, not elsewhere classified: Secondary | ICD-10-CM

## 2016-06-06 DIAGNOSIS — G8222 Paraplegia, incomplete: Principal | ICD-10-CM

## 2016-06-06 MED ORDER — LINACLOTIDE 145 MCG PO CAPS
145.0000 ug | ORAL_CAPSULE | Freq: Every day | ORAL | Status: DC
  Administered 2016-06-07: 145 ug via ORAL
  Filled 2016-06-06: qty 1

## 2016-06-06 NOTE — Progress Notes (Signed)
Physical Therapy Session Note  Patient Details  Name: Perry Morrison MRN: [id number] Date of Birth: 01/30/1961  Today's Date: 06/06/2016 PT Individual Time: 1550-1700 PT Individual Time Calculation (min): 70 min   Short Term Goals: Week 1:  PT Short Term Goal 1 (Week 1): Pt will complete basic transfers with min assist PT Short Term Goal 2 (Week 1): Pt will be able to gait x 25' with LRAD with max of 1 (+2 for w/c follow/safety) PT Short Term Goal 3 (Week 1): Pt will be able to negotiate 1 step with mod assist PT Short Term Goal 4 (Week 1): Pt will be able to manage w/c parts to set up for transfers with supervision cues  Skilled Therapeutic Interventions/Progress Updates:   Bed mobility to sitting EOB with mod assist. Once sitting patient reports cramping in LLE, education on hydration provided by PT.   Squat pivot transfers completed x6 throughout treatment to various surfaces including bed, mat table and nustep with mod assist progressing to min assist from PT. Min-mod cues for head/hip relationship as well as proper UE placement to improve safety of transfer  WC mobility through hospital including controlled environment and simulated community environment of gift shop. 300ft x 2 and 150ft. Supervision assist for obstacle negotiation in crowded environment.   Nustep level 1 with BLE adduction supports x 10 minutes with 1 prolonged rest break. PT encouraged patient to utilize BLE only min assist from UE to initiate movement.    Patient returned to room and left sitting in WC with call bell in reach and all needs met.        Therapy Documentation Precautions:  Precautions Precautions: Fall Precaution Comments: RLE hemiparesis Restrictions Weight Bearing Restrictions: No Vital Signs: Therapy Vitals Temp: 98.3 F (36.8 C) Temp Source: Oral Resp: 20 BP: (!) 135/92 Patient Position (if appropriate): Lying Oxygen Therapy SpO2: 98 % O2 Device: Not Delivered Pain: 4/10 R  shoulder. Tightness. RN aware.   See Function Navigator for Current Functional Status.   Therapy/Group: Individual Therapy   E  06/06/2016, 5:24 PM  

## 2016-06-06 NOTE — Progress Notes (Signed)
Aurea GraffJamie Tome is a 56 y.o. male 10/15/1960 [id number]  Subjective: C/o constipation. Slept well. Feeling OK.  Objective: Vital signs in last 24 hours: Temp:  [98.1 F (36.7 C)-98.3 F (36.8 C)] 98.3 F (36.8 C) (01/27 1422) Pulse Rate:  [81] 81 (01/27 0435) Resp:  [20] 20 (01/27 1422) BP: (130-135)/(87-92) 135/92 (01/27 1422) SpO2:  [95 %-98 %] 98 % (01/27 1422) Weight change:  Last BM Date: 06/05/16  Intake/Output from previous day: 01/26 0701 - 01/27 0700 In: 1760 [P.O.:1760] Out: 1925 [Urine:1925] Last cbgs: CBG (last 3)  No results for input(s): GLUCAP in the last 72 hours.   Physical Exam General: No apparent distress   HEENT: not dry Lungs: Normal effort. Lungs clear to auscultation, no crackles or wheezes. Cardiovascular: Regular rate and rhythm, no edema Abdomen: S/NT/ND; BS(+) Musculoskeletal:  unchanged Neurological: No new neurological deficits Wounds: N/A    Skin: clear   Mental state: Alert, cooperative    Lab Results: BMET    Component Value Date/Time   NA 136 06/03/2016 0547   K 4.6 06/03/2016 0547   CL 97 (L) 06/03/2016 0547   CO2 31 06/03/2016 0547   GLUCOSE 96 06/03/2016 0547   BUN 27 (H) 06/03/2016 0547   CREATININE 1.19 06/03/2016 0547   CALCIUM 9.0 06/03/2016 0547   GFRNONAA >60 06/03/2016 0547   GFRAA >60 06/03/2016 0547   CBC    Component Value Date/Time   WBC 11.9 (H) 06/02/2016 1014   RBC 4.81 06/02/2016 1014   HGB 14.1 06/02/2016 1014   HCT 40.9 06/02/2016 1014   PLT 207 06/02/2016 1014   MCV 85.0 06/02/2016 1014   MCH 29.3 06/02/2016 1014   MCHC 34.5 06/02/2016 1014   RDW 12.6 06/02/2016 1014   LYMPHSABS 2.6 06/02/2016 1014   MONOABS 1.2 (H) 06/02/2016 1014   EOSABS 0.0 06/02/2016 1014   BASOSABS 0.0 06/02/2016 1014    Studies/Results: No results found.  Medications: I have reviewed the patient's current medications.  Assessment/Plan:   1. Thoracic myelopathy. Cont w/CIR therapies for LE weakness    2. DVT prophylaxis w/Lovenox 3. LBP. Norco prn 4.HTN - on Norvasc 5. Neuropsych: This patient is capable of making decisions on his own behalf.              6. Skin/Wound Care: Routine skin checks 7. Fluids/Electrolytes/Nutrition:encourage PO             -recent labs stable  8.CRF stage III. Follow-up chemistries next week 9. Suspect neurogenic bowel bladder.             -continue voiding trial. No voids yets.                          - on urecholine 25mg  TID             -establishing bowel program--daily AM suppository                         -needs bm---                         -mag citrate today                         -SSE if needed 10 Constipation - Linzess to start   Length of stay, days: 5  Sonda PrimesAlex Teng Decou , MD 06/06/2016, 8:25 PM

## 2016-06-06 NOTE — Progress Notes (Signed)
Occupational Therapy Session Note  Patient Details  Name: Perry Morrison MRN: [id number] Date of Birth: 11-21-1960  Today's Date: 06/06/2016 OT Individual Time: 0802-0900 and 1100-1200 OT Individual Time Calculation (min): 58 min and 60 min   Short Term Goals: Week 1:  OT Short Term Goal 1 (Week 1): Pt will complete LB dressing sit <> stand with min A using AE PRN OT Short Term Goal 2 (Week 1): Pt will transfer w/c <> toilet with min A OT Short Term Goal 3 (Week 1): Pt will complete 1 grooming task in standing to increase standing tolerance for ADLs OT Short Term Goal 4 (Week 1): Pt will manage w/c parts independently during functional tasks/ transfers     Skilled Therapeutic Interventions/Progress Updates:    Visit 1: c/o pain/discomfort in R thigh. Premedicated. Provided pt hot pack to place on thigh at end of session. Pt received in bed, already dressed for the day. Pt worked on rolling/ bed mobility techniques to sit to EOB.  Used squat pivot to w/c. Propelled self to gym. Mat transfers with close S. Scooting on mat and UE stretches as pt c/o lat and tricep tightness from all the UB pushing he needs to do.  Worked on sit to stand at bar with max A to block R knee from buckling even with R AFO. Attempted 3 x and then decided to focus on sit to stand with use of stedy as it would provide knee support. Pt taken back to room and worked on standing with steady. He leans forward through balls of feet to "Lock out " his R knee. Worked on wt shifting over heels. Repeated sit >< stand several times from stand to partial sit. At end of session, pt transferred back to bed to prepare for nursing care.    Visit 2: decreased c/o R thigh pain. Pt received in bed, worked on rolling with focus on pt using L leg to assist his R.  Worked on sit to stands in [place] for 25 min and wt shifting over heels. Pt transferred back to chair and taken to gym.  Squat pivots to mat with S. On mat, core strengthening to  assist with pelvic control (lateral wt shifts, dynamic reaching with opposing hip lift, trunk ext/flex, reciprocal scooting back with max A to shift R hip back). Pt requested to lay in prone for 10 min to relax back. Moved into prone with mod A with pillow under abdomen.  Transferred back to chair and taken back to room with all needs met.    Therapy Documentation Precautions:  Precautions Precautions: Fall Precaution Comments: RLE hemiparesis Restrictions Weight Bearing Restrictions: No    Vital Signs: Therapy Vitals Temp: 98.1 F (36.7 C) Temp Source: Oral Pulse Rate: 81 Resp: 20 BP: 130/87 Patient Position (if appropriate): Lying Oxygen Therapy SpO2: 95 % O2 Device: Not Delivered  ADL:   See Function Navigator for Current Functional Status.   Therapy/Group: Individual Therapy  [place] 06/06/2016, 8:17 AM

## 2016-06-07 MED ORDER — LINACLOTIDE 145 MCG PO CAPS
290.0000 ug | ORAL_CAPSULE | Freq: Every day | ORAL | Status: DC
  Administered 2016-06-09 – 2016-06-11 (×3): 290 ug via ORAL
  Filled 2016-06-07 (×2): qty 1
  Filled 2016-06-07: qty 2
  Filled 2016-06-07 (×4): qty 1

## 2016-06-07 NOTE — Plan of Care (Signed)
Problem: RH BLADDER ELIMINATION Goal: RH STG MANAGE BLADDER WITH EQUIPMENT WITH ASSISTANCE STG Manage Bladder With Equipment With min Assistance   Outcome: Not Progressing Total assist with I & O caths.   

## 2016-06-07 NOTE — Progress Notes (Signed)
No urge to void. I & O cath at 2100=58250ml and at 0115=54850ml. (+) bowel sounds X 4 quads. Patient reports, "good" BM Saturday morning? Complains of numbness to RLE, not a new complaint. Denies pain. Alfredo MartinezMurray, Jerremy A

## 2016-06-07 NOTE — Plan of Care (Signed)
Problem: RH BOWEL ELIMINATION Goal: RH STG MANAGE BOWEL WITH ASSISTANCE STG Manage Bowel with min Assistance.   Outcome: Not Progressing Continues to require supp.

## 2016-06-07 NOTE — Progress Notes (Addendum)
Perry GraffJamie Morrison is a 56 y.o. male 12/27/1960 [id number]  Subjective: C/o constipation. Slept well. Feeling OK. C/o episode of sweating this am - a few minutes after exercising Objective: Vital signs in last 24 hours: Temp:  [98.3 F (36.8 C)-99 F (37.2 C)] 99 F (37.2 C) (01/28 0527) Pulse Rate:  [84] 84 (01/28 0527) Resp:  [18-20] 18 (01/28 0527) BP: (105-135)/(78-92) 105/78 (01/28 0527) SpO2:  [98 %] 98 % (01/28 0527) Weight change:  Last BM Date: 06/06/16 (per patient report)  Intake/Output from previous day: 01/27 0701 - 01/28 0700 In: 600 [P.O.:600] Out: 3251 [Urine:3250; Stool:1] Last cbgs: CBG (last 3)  No results for input(s): GLUCAP in the last 72 hours.   Physical Exam General: No apparent distress   HEENT: not dry Lungs: Normal effort. Lungs clear to auscultation, no crackles or wheezes. Cardiovascular: Regular rate and rhythm, no edema Abdomen: S/NT/ND; BS(+) Musculoskeletal:  unchanged Neurological: No new neurological deficits Wounds: N/A    Skin: clear   Mental state: Alert, oriented, cooperative    Lab Results: BMET    Component Value Date/Time   NA 136 06/03/2016 0547   K 4.6 06/03/2016 0547   CL 97 (L) 06/03/2016 0547   CO2 31 06/03/2016 0547   GLUCOSE 96 06/03/2016 0547   BUN 27 (H) 06/03/2016 0547   CREATININE 1.19 06/03/2016 0547   CALCIUM 9.0 06/03/2016 0547   GFRNONAA >60 06/03/2016 0547   GFRAA >60 06/03/2016 0547   CBC    Component Value Date/Time   WBC 11.9 (H) 06/02/2016 1014   RBC 4.81 06/02/2016 1014   HGB 14.1 06/02/2016 1014   HCT 40.9 06/02/2016 1014   PLT 207 06/02/2016 1014   MCV 85.0 06/02/2016 1014   MCH 29.3 06/02/2016 1014   MCHC 34.5 06/02/2016 1014   RDW 12.6 06/02/2016 1014   LYMPHSABS 2.6 06/02/2016 1014   MONOABS 1.2 (H) 06/02/2016 1014   EOSABS 0.0 06/02/2016 1014   BASOSABS 0.0 06/02/2016 1014    Studies/Results: No results found.  Medications: I have reviewed the patient's current  medications.  A/P:  1. Thoracic myelopathy continue with inpatient rehabilitation therapy is for lower extremity weakness 2. DVT prophylaxis with Lovenox 3. Low back pain. Manage with Norco when necessary 4. Hypertension continue with Norvasc 5. Chronic renal failure stage III. Obtain chemistries next week 6. Constipation - refractory. Will increase Linzess to 290 mcg/d 7. Episode of sweating this am - resolved. EKG - NSR   Length of stay, days: 6  Sonda PrimesAlex Marlaysia Lenig , MD 06/07/2016, 10:02 AM

## 2016-06-07 NOTE — Progress Notes (Signed)
Pt noted to be diaphoretic with bilateral upper extremity twitching. VS are 143/100, RR 22, HR 107, temp 98.1, 100% O2 on Ra, pt sitting in w/c up at sink.  Notified doctor on call, requested EKG.  EKG completed, pt noted with no c/o pain, no dizziness at this time, no distress at this time.

## 2016-06-08 ENCOUNTER — Inpatient Hospital Stay (HOSPITAL_COMMUNITY): Payer: Self-pay | Admitting: Physical Therapy

## 2016-06-08 ENCOUNTER — Inpatient Hospital Stay (HOSPITAL_COMMUNITY): Payer: Medicaid Other | Admitting: Occupational Therapy

## 2016-06-08 ENCOUNTER — Inpatient Hospital Stay (HOSPITAL_COMMUNITY): Payer: Self-pay | Admitting: Occupational Therapy

## 2016-06-08 LAB — URINALYSIS, ROUTINE W REFLEX MICROSCOPIC
Bilirubin Urine: NEGATIVE
Glucose, UA: NEGATIVE mg/dL
Hgb urine dipstick: NEGATIVE
Ketones, ur: NEGATIVE mg/dL
Nitrite: NEGATIVE
PROTEIN: NEGATIVE mg/dL
SPECIFIC GRAVITY, URINE: 1.017 (ref 1.005–1.030)
pH: 6 (ref 5.0–8.0)

## 2016-06-08 MED ORDER — BETHANECHOL CHLORIDE 25 MG PO TABS
50.0000 mg | ORAL_TABLET | Freq: Three times a day (TID) | ORAL | Status: DC
  Administered 2016-06-08 – 2016-06-15 (×21): 50 mg via ORAL
  Filled 2016-06-08 (×21): qty 2

## 2016-06-08 MED ORDER — BACLOFEN 5 MG HALF TABLET
5.0000 mg | ORAL_TABLET | Freq: Three times a day (TID) | ORAL | Status: DC
  Administered 2016-06-08 – 2016-06-11 (×9): 5 mg via ORAL
  Filled 2016-06-08 (×9): qty 1

## 2016-06-08 NOTE — Plan of Care (Signed)
Problem: RH BLADDER ELIMINATION Goal: RH STG MANAGE BLADDER WITH EQUIPMENT WITH ASSISTANCE STG Manage Bladder With Equipment With min Assistance   Outcome: Not Progressing Total assist with I & O caths.

## 2016-06-08 NOTE — Plan of Care (Signed)
Problem: RH BOWEL ELIMINATION Goal: RH STG MANAGE BOWEL WITH ASSISTANCE STG Manage Bowel with min Assistance.   Outcome: Not Progressing Max assist with supp.

## 2016-06-08 NOTE — Progress Notes (Signed)
No sensation to void. At 2300, I & O cath=400 and  large loose stool, continent in bathroom. At 0300, I & O cath=700. Spoke with patient about learning to perform self caths, agreeable to start learning. No further diaphoretic episodes. Performed dig stim after supp Sunday morning, no stool felt in rectal vault.Perry MartinezMurray, Lenzie A

## 2016-06-08 NOTE — Progress Notes (Signed)
[facility]IC[facility] & REHABILITATION     PROGRESS NOTE    Subjective/Complaints:  Moved bowels overnight and thi smorning. Not much change in legs  ROS: pt denies nausea, vomiting, diarrhea, cough, shortness of breath or chest pain   Objective: Vital Signs: Blood pressure 128/79, pulse 83, temperature 98.6 F (37 C), temperature source Oral, resp. rate 18, height 6\' 1"  (1.854 m), weight 106 kg (233 lb 11 oz), SpO2 98 %. No results found. No results for input(s): WBC, HGB, HCT, PLT in the last 72 hours. No results for input(s): NA, K, CL, GLUCOSE, BUN, CREATININE, CALCIUM in the last 72 hours.  Invalid input(s): CO CBG (last 3)  No results for input(s): GLUCAP in the last 72 hours.  Wt Readings from Last 3 Encounters:  06/02/16 106 kg (233 lb 11 oz)  05/26/16 102.1 kg (225 lb)    Physical Exam:  HENT:  Head: Normocephalic and atraumatic.  Eyes: Conjunctivae and EOM are normal. Left eye exhibits no discharge.  Neck: Normal range of motion. Neck supple. No thyromegaly present.  Cardiovascular: RRR without murmur  Respiratory: cta bilaterally GI: Soft. Bowel sounds are normal. He exhibits no distension.  Genitourinary: no changes  Musculoskeletal: He exhibits no edema or tenderness.  Right thigh non-tender Neurological:  Neurological: He is alert and oriented to person, place, and time.  Pt alert and appropriate.  B/l UE 5/5.  RLE: still with tr to 1/5 HF, 0/5 distally LLE: 2+/5 HF, 3- KE and 3/5 ADF/PF.  Sensation intact to light touch throughout DTRs 1+ LLE.   Skin: Skin remains warm and dry.    Assessment/Plan: 1. Functional and mobility deficits secondary to thoracic myelopathy/transverse myelitis which require 3+ hours per day of interdisciplinary therapy in a comprehensive inpatient rehab setting. Physiatrist is providing close team supervision and 24 hour management of active medical problems listed below. Physiatrist and rehab team continue to  assess barriers to discharge/monitor patient progress toward functional and medical goals.  Function:  Bathing Bathing position   Position: Shower  Bathing parts Body parts bathed by patient: Right arm, Right upper leg, Left arm, Left upper leg, Chest, Front perineal area, Right lower leg, Abdomen, Left lower leg, Buttocks, Back    Bathing assist Assist Level: Supervision or verbal cues      Upper Body Dressing/Undressing Upper body dressing   What is the patient wearing?: Pull over shirt/dress     Pull over shirt/dress - Perfomed by patient: Thread/unthread right sleeve, Thread/unthread left sleeve, Put head through opening, Pull shirt over trunk          Upper body assist Assist Level: No help, No cues   Set up : To obtain clothing/put away  Lower Body Dressing/Undressing Lower body dressing   What is the patient wearing?: Pants, Ted Hose, Non-skid slipper socks Underwear - Performed by patient: Thread/unthread left underwear leg, Pull underwear up/down Underwear - Performed by helper: Thread/unthread right underwear leg Pants- Performed by patient: Thread/unthread left pants leg, Pull pants up/down Pants- Performed by helper: Thread/unthread right pants leg   Non-skid slipper socks- Performed by helper: Don/doff right sock, Don/doff left sock     Shoes - Performed by patient: Fasten right, Fasten left Shoes - Performed by helper: Don/doff right shoe, Don/doff left shoe       TED Hose - Performed by helper: Don/doff right TED hose, Don/doff left TED hose  Lower body assist Assist for lower body dressing: Touching or steadying assistance (Pt > 75%)  Toileting Toileting Toileting activity did not occur: No continent bowel/bladder event Toileting steps completed by patient: Performs perineal hygiene, Adjust clothing prior to toileting Toileting steps completed by helper: Adjust clothing after toileting Toileting Assistive Devices: Other (comment) (stedy)   Toileting assist Assist level: Touching or steadying assistance (Pt.75%)   Transfers Chair/bed transfer Chair/bed transfer activity did not occur: N/A Chair/bed transfer method: Squat pivot Chair/bed transfer assist level: Touching or steadying assistance (Pt > 75%) Chair/bed transfer assistive device: Armrests     Locomotion Ambulation     Max distance: 30 Assist level: 2 helpers   Wheelchair   Type: Manual Max wheelchair distance: 328ft  Assist Level: Supervision or verbal cues  Cognition Comprehension Comprehension assist level: Follows complex conversation/direction with no assist  Expression Expression assist level: Expresses complex ideas: With no assist  Social Interaction Social Interaction assist level: Interacts appropriately with others - No medications needed.  Problem Solving Problem solving assist level: Solves complex problems: Recognizes & self-corrects  Memory Memory assist level: Complete Independence: No helper   Medical Problem List and Plan: 1.  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   -continue CIR therapies             -prednisone  completed    2.  DVT Prophylaxis/Anticoagulation: SCDs. Vascular study negative  -conitnue q12 lovenox 3. Pain Management: Hydrocodone as needed 4. Hypertension. Norvasc 10 mg daily Monitor when out of bed 5. Neuropsych: This patient is capable of making decisions on his own behalf.   6. Skin/Wound Care: Routine skin checks 7. Fluids/Electrolytes/Nutrition:encourage PO  -recent labs stable  8.CRI stage III. Follow-up chemistries next week 9. Neurogenic bowel bladder.  -continue voiding trial. No voids yets.    - increase urecholine to 50mg  TID  -establishing bowel program--daily AM suppository   -needs bm---   -linzess increased with results--pt had two continent bm's overnight and this morning LOS (Days) 7 A FACE TO  FACE EVALUATION WAS PERFORMED  Faith Rogue T, MD 06/08/2016 9:00 AM

## 2016-06-08 NOTE — Plan of Care (Signed)
Problem: RH BLADDER ELIMINATION Goal: RH STG MANAGE BLADDER WITH ASSISTANCE STG Manage Bladder With min  Assistance   Outcome: Not Progressing I+O cath q4

## 2016-06-08 NOTE — Progress Notes (Signed)
Pt having muscle spasms, visible spasms of arms and legs, OT noted visible spasms of muscles up his back earlier today. PT declined afternoon session due to spasms. Reviewed medications that can be ordered if he is willing to take medications. Heating pad applied and put back to bed with some relief however still noted tired,fatigue but no pain. Pam Love PAC notified. Me

## 2016-06-08 NOTE — Progress Notes (Signed)
Occupational Therapy Session Note  Patient Details  Name: Perry Morrison MRN: [id number] Date of Birth: 10/22/1960  Today's Date: 06/08/2016 OT Individual Time: 1110-1205 OT Individual Time Calculation (min): 55 min  Pt missed 20 minutes secondary to nursing care.  Short Term Goals: Week 1:  OT Short Term Goal 1 (Week 1): Pt will complete LB dressing sit <> stand with min A using AE PRN OT Short Term Goal 2 (Week 1): Pt will transfer w/c <> toilet with min A OT Short Term Goal 3 (Week 1): Pt will complete 1 grooming task in standing to increase standing tolerance for ADLs OT Short Term Goal 4 (Week 1): Pt will manage w/c parts independently during functional tasks/ transfers  Skilled Therapeutic Interventions/Progress Updates:  Pain: No pain, but pt with complaints of cramping and tightness to UB muscles.   Upon entering room, pt found seated in w/c. Pt stated he already completed bathing and dressing from sink level. Pt propelled self towards therapy gym and therapist assisted with the rest of they way due to pain/discomfort/cramping to BUEs. Once in therapy gym, pt engaged in light stretch to BUEs to problem solve to see if this would help alleviate cramps and spasms in UEs. Pt stated after light stretch to RUE, he felt it wasn't cramping as bad or as much. Pt then performed 3 sets of 10 w/c pushups and therapist encouraged a stretch to biceps, triceps, lats in between each set. Pt then worked on core strengthening exercises using 4.4lb weighted ball, 1 set of 20 forward, left, right also using trampoline. Pt completed 1 set of 10 UE 4.4 weighted ball exercises including shoulder flexion/extension, shoulder protraction/retraction, shoulder abduction/adduction, elbow flexion/extension. Administered 3lb weighted bar for pt to use in his room. Pt with great technique and form during exercises. Therapist assisted pt back to his room. Pt transferred w/c to STEDY to recliner. For sit to/from stands in  EssigSTEDY, pt required up to moderate assistance. At end of session, left pt seated in recliner with all needs within reach.   Therapy Documentation Precautions:  Precautions Precautions: Fall Precaution Comments: RLE hemiparesis Restrictions Weight Bearing Restrictions: No  See Function Navigator for Current Functional Status.  Therapy/Group: Individual Therapy  Edwin CapPatricia Cyruss Arata , MS, OTR/L, CLT  06/08/2016, 12:36 PM

## 2016-06-08 NOTE — Progress Notes (Signed)
Physical Therapy Session Note  Patient Details  Name: Perry Morrison MRN: [id number] Date of Birth: 07/21/1960  Today's Date: 06/08/2016 PT Individual Time: 0830-0930 PT Individual Time Calculation (min): 60 min   Short Term Goals: Week 1:  PT Short Term Goal 1 (Week 1): Pt will complete basic transfers with min assist PT Short Term Goal 2 (Week 1): Pt will be able to gait x 25' with LRAD with max of 1 (+2 for w/c follow/safety) PT Short Term Goal 3 (Week 1): Pt will be able to negotiate 1 step with mod assist PT Short Term Goal 4 (Week 1): Pt will be able to manage w/c parts to set up for transfers with supervision cues  Skilled Therapeutic Interventions/Progress Updates: Pt received supine in bed, denies pain but does report cramping, numbness in RLE and BUEs. Reports he feels like his LLE is getting weaker. Supine>sit with minA for RLE management. Sit >stand in stedy with modA. Transfer w/c <>toilet totalA in stedy. Pt attempted to pull down pants with one UE at a time, however reports he cannot keep his balance, and therapist ultimately performed clothing management for pt. Pt performs hygiene modi. Seated in stedy at sink, pt performs hand hygiene and grooming; ultimately requesting to sit in w/c due to core strength deficits and high energy expenditure of maintaining balance while in stedy performing dynamic UE tasks. TEDs and shoes, RLE AFO donned totalA for time management. Leg loop donned to RLE to assist pt with LE management; plan to measure for and make pt RLE leg loop to A with functional mobility tasks as no active movement noted in RLE at this time. Squat pivot transfer w/c <> mat table modA. Sit <>stand attempted x2 trials maxA from therapist unable to remove LUE from table to transition to RW. Ultimately came to standing x2 trials with maxA +2. While sitting edge of mat pt with several LOBs forward/backward requiring mod/maxA to regain balance to prevent fall. LLE assessed for strength,  hip flexion 1/5, knee extension 3+/5, knee flexion 4/5, ankle dorsiflexion 4-/5, ankle plantarflexion 4/5. Pt returned to room in w/c totalA for energy conservation. Remained seated in w/c at end of session, all needs in reach. PA alerted following session of reduced functional status and objective measures.      Therapy Documentation Precautions:  Precautions Precautions: Fall Precaution Comments: RLE hemiparesis Restrictions Weight Bearing Restrictions: No  See Function Navigator for Current Functional Status.   Therapy/Group: Individual Therapy  [name] 06/08/2016, 11:26 AM

## 2016-06-08 NOTE — Progress Notes (Signed)
Occupational Therapy Session Note  Patient Details  Name: Perry GraffJamie Morrison MRN: [id number] Date of Birth: 12/02/1960  Today's Date: 06/08/2016 OT Individual Time: 1300-1400 OT Individual Time Calculation (min): 60 min    Short Term Goals: Week 1:  OT Short Term Goal 1 (Week 1): Pt will complete LB dressing sit <> stand with min A using AE PRN OT Short Term Goal 2 (Week 1): Pt will transfer w/c <> toilet with min A OT Short Term Goal 3 (Week 1): Pt will complete 1 grooming task in standing to increase standing tolerance for ADLs OT Short Term Goal 4 (Week 1): Pt will manage w/c parts independently during functional tasks/ transfers  Skilled Therapeutic Interventions/Progress Updates:    Pt seen for OT session focusing on LB strengthening/ stretching. Pt sitting in recliner upon arrival, voicing increased fatigue, however, willing to attempt therapy. STEADY used for functional transfers throughout session due to increased weakness/ fatigue this session. He self propelled w/c to therapy gym mod I, rest break required upon arrival to gym.  Returned to supine on mat and measured for leg loops to increase functional independence with management of LEs. Attempted "clam shell" exercises with LEs to facilitate hip stretch. Required mod-max A to complete with R LE, min-mod A for L. [facility], pt voiced urgent need for toileting task. Pt taken back to room in STEADY and total A for clothing management prior to toileting. Pt left seated on toilet at end of session with RN present to perform cath.  Pt with increased spasms in UEs this session requiring rest breaks throughout. RN made aware of pt's performance today.   Therapy Documentation Precautions:  Precautions Precautions: Fall Precaution Comments: RLE hemiparesis Restrictions Weight Bearing Restrictions: No Pain:   No/ denies pain  See Function Navigator for Current Functional Status.   Therapy/Group: Individual Therapy  [name], Olufemi Mofield  C 06/08/2016, 7:23 AM

## 2016-06-08 NOTE — Progress Notes (Signed)
Occupational Therapy Session Note  Patient Details  Name: Perry GraffJamie Raggio MRN: [id number] Date of Birth: 05/06/1961  Today's Date: 06/08/2016 OT Individual Time: [id number] OT Individual Time Calculation (min): 15 min  and Today's Date: 06/08/2016 OT Missed Time: 45 Minutes Missed Time Reason: Pain   Short Term Goals: Week 1:  OT Short Term Goal 1 (Week 1): Pt will complete LB dressing sit <> stand with min A using AE PRN OT Short Term Goal 2 (Week 1): Pt will transfer w/c <> toilet with min A OT Short Term Goal 3 (Week 1): Pt will complete 1 grooming task in standing to increase standing tolerance for ADLs OT Short Term Goal 4 (Week 1): Pt will manage w/c parts independently during functional tasks/ transfers  Skilled Therapeutic Interventions/Progress Updates:   Upon entering the room, pt supine in bed with increased c/o spasms while in bed. Pt reports, "I just finished having a BM and I don't feel well." Pt is visibly having spasms at rest and therefore declined OT intervention at this time. OT encouraged pt to call for muscle relaxer if one is available to him. Pt is agreeable and RN notified. Pt remained supine in bed with call bell and all needed items within reach.      Therapy Documentation Precautions:  Precautions Precautions: Fall Precaution Comments: RLE hemiparesis Restrictions Weight Bearing Restrictions: No General: General OT Amount of Missed Time: 45 Minutes Vital Signs: Therapy Vitals Temp: 98.5 F (36.9 C) Temp Source: Oral Pulse Rate: 91 Resp: 18 BP: 138/82 Patient Position (if appropriate): Lying Oxygen Therapy SpO2: 100 % O2 Device: Not Delivered  See Function Navigator for Current Functional Status.   Therapy/Group: Individual Therapy  Alen BleacherBradsher, Demetrius Barrell P 06/08/2016, 3:24 PM

## 2016-06-08 NOTE — Progress Notes (Signed)
Refused supp and linzess this AM. Up to BR this AM, continent BM. Perry MartinezMurray, Perry Morrison

## 2016-06-09 ENCOUNTER — Inpatient Hospital Stay (HOSPITAL_COMMUNITY): Payer: Medicaid Other | Admitting: Occupational Therapy

## 2016-06-09 ENCOUNTER — Inpatient Hospital Stay (HOSPITAL_COMMUNITY): Payer: Self-pay | Admitting: Physical Therapy

## 2016-06-09 ENCOUNTER — Inpatient Hospital Stay (HOSPITAL_COMMUNITY): Payer: Self-pay | Admitting: Occupational Therapy

## 2016-06-09 LAB — URINE CULTURE

## 2016-06-09 NOTE — Progress Notes (Signed)
Physical Therapy Session Note  Patient Details  Name: Perry GraffJamie Rader MRN: [id number] Date of Birth: 01/28/1961  Today's Date: 06/09/2016 PT Individual Time: 1000-1100 PT Individual Time Calculation (min): 60 min   Short Term Goals: Week 1:  PT Short Term Goal 1 (Week 1): Pt will complete basic transfers with min assist PT Short Term Goal 2 (Week 1): Pt will be able to gait x 25' with LRAD with max of 1 (+2 for w/c follow/safety) PT Short Term Goal 3 (Week 1): Pt will be able to negotiate 1 step with mod assist PT Short Term Goal 4 (Week 1): Pt will be able to manage w/c parts to set up for transfers with supervision cues  Skilled Therapeutic Interventions/Progress Updates: Pt received seated in w/c, denies pain and agreeable to treatment. W/c propulsion x150' with BUE for strengthening and endurance. Squat pivot transfer w/c >mat table minA. Sit <>stand maxA with RW. Gait x5' with RW and maxi-sky and modA, +2 for w/c follow. Pt fatigues quickly, poor placement inside RW, LEs buckling and returned to sitting in w/c with maxA. Pt diaphoretic, c/o significant pain in BUEs from spasms. RN alerted who along with NT performed I&O cath to void 200 mL. Vitals following cath 131/89 with pt reporting spasms reduced; cues for deep breathing and relaxation. Returned to room totalA, remained seated in w/c with NT present to assist onto toilet. Therapist obtained ultra lightweight w/c for future session to reduce energy expenditure and UE fatigue with propulsion.      Therapy Documentation Precautions:  Precautions Precautions: Fall Precaution Comments: RLE hemiparesis Restrictions Weight Bearing Restrictions: No Pain: Pain Assessment Pain Assessment: 0-10 Pain Score: 4  Pain Location: Shoulder (towards rt hand, cramping) Pain Orientation: Right Pain Radiating Towards:  (back) Pain Descriptors / Indicators: Cramping Pain Onset: Progressive Patients Stated Pain Goal: 3 Pain Intervention(s):  Medication (See eMAR);Repositioned   See Function Navigator for Current Functional Status.   Therapy/Group: Individual Therapy  [name] 06/09/2016, 3:52 PM

## 2016-06-09 NOTE — Progress Notes (Signed)
Occupational Therapy Weekly Progress Note  Patient Details  Name: Perry Morrison MRN: [id number] Date of Birth: 17-May-1960  Beginning of progress report period: June 01, 2016 End of progress report period: June 09, 2016  Today's Date: 06/09/2016 OT Individual Time: 0700-0800 and 1100-1125 OT Individual Time Calculation (min): 60 min and 25 min OT Missed Time: 35 min   Patient has met 3 of 4 short term goals.  Pt making steady progress towards OT goals. He did not meet standing grooming goal as stand not yet at functional level as he requires at least one UE support on RW. Pt can be inconsistent with performance based on fatigue and muscle spasms. Can require min- total A for LB dressing and supervision- +2 for sit <> stands and transfers when most fatigued. Plan to cont education for ADLs from seated and standing position for pt to be able to alternate if needed at d/c depending on fatigue levels.    Patient continues to demonstrate the following deficits: abnormal posture, ataxia and R LE paraparesis  and therefore will continue to benefit from skilled OT intervention to enhance overall performance with BADL and Reduce care partner burden.  Patient progressing toward long term goals..  Continue plan of care.  OT Short Term Goals Week 1:  OT Short Term Goal 1 (Week 1): Pt will complete LB dressing sit <> stand with min A using AE PRN OT Short Term Goal 1 - Progress (Week 1): Met OT Short Term Goal 2 (Week 1): Pt will transfer w/c <> toilet with min A OT Short Term Goal 2 - Progress (Week 1): Met OT Short Term Goal 3 (Week 1): Pt will complete 1 grooming task in standing to increase standing tolerance for ADLs OT Short Term Goal 3 - Progress (Week 1): Not met OT Short Term Goal 4 (Week 1): Pt will manage w/c parts independently during functional tasks/ transfers OT Short Term Goal 4 - Progress (Week 1): Met Week 2:  OT Short Term Goal 1 (Week 2): Pt will complete one grooming task in  standing to increase functional standing balance/ tolerance OT Short Term Goal 2 (Week 2): Pt will complete 3/3 toileting tasks with min A OT Short Term Goal 3 (Week 2): Pt will transfer to toilet with close supervision to decrease caregiver burden OT Short Term Goal 4 (Week 2): Will initiate shower transfer goal utilizing tub/shower combination with tub transfer bench in prep for d/c  Skilled Therapeutic Interventions/Progress Updates:    Session One: Pt seen for OT ADL bathing/dressing session. Pt in supine upon arrival, agreeable to tx session. Pt reports feeling better this morning compared to yesterday with less UE spasms throughout the night. He transferred to EOB with mod A for management of LEs. STEADY used for functional transfers throughout session. Pt able to stand into STEADY with guarding assist, improvement from yesterday. However, he did require assist managing LEs onto STEADY platform, Pt reported having just received suppository and taken to toilet for BM upon arrival.  He was transferred into shower and bathed seated on tub transfer bench with set-up assist.  He dressed seated in w/c, standing with use of STEADY for pants to be pulled up total A.  Pt completed grooming tasks mod I seated at sink. Left sitting in w/c with all needs in reach, set-up with meal tray.    Session Two: Pt seen for OT session focusing on functional mobility and transfers. Pt received sitting on toilet, able to have BM, pt completing  hygiene independently. He stood in STEADY requiring mod-max A to stand into STEADY.  Pt returned to recliner, requesting to wait until later this P.M. To finish remainder of session. Will attempt to make up as able. Pt cont to have UE spasms/ discomfort. He reports this is greater following exertion. Pt had walked with PT prior to OT arrival. Attempted to contact PA to make aware of pt's condition. RN made aware as well.   OT attempted to make up time at 1530. Pt reports  having jsut gotten back in bed following therapy session. He voiced increased pain and spasms in UEs. He declined therapy at this time, desiring to rest. Pt left in supine, all needs in reach.   Therapy Documentation Precautions:  Precautions Precautions: Fall Precaution Comments: RLE hemiparesis Restrictions Weight Bearing Restrictions: No Pain:   no/ denies pain  See Function Navigator for Current Functional Status.   Therapy/Group: Individual Therapy  Morrison, Perry Engelson C 06/09/2016, 6:46 AM

## 2016-06-09 NOTE — Progress Notes (Signed)
[facility] PHYSICAL MEDICINE & REHABILITATION     PROGRESS NOTE    Subjective/Complaints:  Had a rough day yesterday. Didn't feel well. Feels much better this morning. Still no voluntary voids yet  ROS: pt denies nausea, vomiting, diarrhea, cough, shortness of breath or chest pain    Objective: Vital Signs: Blood pressure 123/78, pulse 88, temperature 99.1 F (37.3 C), temperature source Oral, resp. rate 18, height 6\' 1"  (1.854 m), weight 106 kg (233 lb 11 oz), SpO2 100 %. No results found. No results for input(s): WBC, HGB, HCT, PLT in the last 72 hours. No results for input(s): NA, K, CL, GLUCOSE, BUN, CREATININE, CALCIUM in the last 72 hours.  Invalid input(s): CO CBG (last 3)  No results for input(s): GLUCAP in the last 72 hours.  Wt Readings from Last 3 Encounters:  06/02/16 106 kg (233 lb 11 oz)  05/26/16 102.1 kg (225 lb)    Physical Exam:  HENT:  Head: Normocephalic and atraumatic.  Eyes: Conjunctivae and EOM are normal. Left eye exhibits no discharge.  Neck: Normal range of motion. Neck supple. No thyromegaly present.  Cardiovascular: RRR  Respiratory: CTA B GI: Soft. Bowel sounds are normal. He exhibits no distension.  Genitourinary: no changes  Musculoskeletal: He exhibits no edema or tenderness.  Right thigh non-tender Neurological:  Neurological: He is alert and oriented to person, place, and time.  Pt alert and oriented.  B/l UE 5/5.  RLE: still with tr to 1/5 HF, 0/5 distally--no change LLE: 2+/5 HF, 3- KE and 3/5 ADF/PF---no change Sensation intact to light touch throughout DTRs 1+ LLE.   Skin: Skin remains warm and dry.  Psych: pleasant and appropriate   Assessment/Plan: 1. Functional and mobility deficits secondary to thoracic myelopathy/transverse myelitis which require 3+ hours per day of interdisciplinary therapy in a comprehensive inpatient rehab setting. Physiatrist is providing close team supervision and 24 hour management of active  medical problems listed below. Physiatrist and rehab team continue to assess barriers to discharge/monitor patient progress toward functional and medical goals.  Function:  Bathing Bathing position   Position: Shower  Bathing parts Body parts bathed by patient: Right arm, Right upper leg, Left arm, Left upper leg, Chest, Front perineal area, Right lower leg, Abdomen, Left lower leg, Buttocks, Back    Bathing assist Assist Level: Supervision or verbal cues      Upper Body Dressing/Undressing Upper body dressing   What is the patient wearing?: Pull over shirt/dress     Pull over shirt/dress - Perfomed by patient: Thread/unthread right sleeve, Thread/unthread left sleeve, Put head through opening, Pull shirt over trunk          Upper body assist Assist Level: No help, No cues   Set up : To obtain clothing/put away  Lower Body Dressing/Undressing Lower body dressing   What is the patient wearing?: Pants, Ted Hose, Non-skid slipper socks Underwear - Performed by patient: Thread/unthread left underwear leg, Pull underwear up/down Underwear - Performed by helper: Thread/unthread right underwear leg Pants- Performed by patient: Thread/unthread left pants leg, Pull pants up/down Pants- Performed by helper: Thread/unthread right pants leg   Non-skid slipper socks- Performed by helper: Don/doff right sock, Don/doff left sock     Shoes - Performed by patient: Fasten right, Fasten left Shoes - Performed by helper: Don/doff right shoe, Don/doff left shoe       TED Hose - Performed by helper: Don/doff right TED hose, Don/doff left TED hose  Lower body assist Assist for  lower body dressing: Touching or steadying assistance (Pt > 75%)      Toileting Toileting Toileting activity did not occur: No continent bowel/bladder event Toileting steps completed by patient: Adjust clothing prior to toileting, Performs perineal hygiene Toileting steps completed by helper: Performs perineal  hygiene, Adjust clothing after toileting Toileting Assistive Devices: Other (comment)  Toileting assist Assist level: Touching or steadying assistance (Pt.75%)   Transfers Chair/bed transfer Chair/bed transfer activity did not occur: N/A Chair/bed transfer method: Squat pivot Chair/bed transfer assist level: Touching or steadying assistance (Pt > 75%) Chair/bed transfer assistive device: Armrests     Locomotion Ambulation     Max distance: 30 Assist level: 2 helpers   Wheelchair   Type: Manual Max wheelchair distance: 34ft  Assist Level: Supervision or verbal cues  Cognition Comprehension Comprehension assist level: Follows complex conversation/direction with no assist  Expression Expression assist level: Expresses complex ideas: With no assist  Social Interaction Social Interaction assist level: Interacts appropriately with others with medication or extra time (anti-anxiety, antidepressant).  Problem Solving Problem solving assist level: Solves complex problems: Recognizes & self-corrects  Memory Memory assist level: Complete Independence: No helper   Medical Problem List and Plan: 1.  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   -continue CIR therapies             -prednisone  Completed--have seen no change in neuro exam    2.  DVT Prophylaxis/Anticoagulation: SCDs. Vascular study negative  -conitnue q12 lovenox 3. Pain Management: Hydrocodone as needed 4. Hypertension. Norvasc 10 mg daily Monitor when out of bed 5. Neuropsych: This patient is capable of making decisions on his own behalf.  -appears to be experiencing some periodic anxiety, likely centered around dx and cause of dx   6. Skin/Wound Care: Routine skin checks 7. Fluids/Electrolytes/Nutrition:encourage PO  -recent labs stable  8.CRI stage III. Follow-up chemistries next week 9. Neurogenic bowel bladder.  -continue  voiding trial. No voids yets.    - increased urecholine to 50mg  TID   -I/o cath prn  -ua suspicious. ucx pending  -having continent bowel movements   -linzess addedwith results-continue   LOS (Days) 8 A FACE TO FACE EVALUATION WAS PERFORMED  [name], MD 06/09/2016 8:43 AM

## 2016-06-09 NOTE — Progress Notes (Signed)
Occupational Therapy Session Note  Patient Details  Name: Perry Morrison MRN: [id number] Date of Birth: 1961-05-04  Today's Date: 06/09/2016 OT Individual Time: 1400-1500 OT Individual Time Calculation (min): 60 min   Short Term Goals: Week 1:  OT Short Term Goal 1 (Week 1): Pt will complete LB dressing sit <> stand with min A using AE PRN OT Short Term Goal 1 - Progress (Week 1): Met OT Short Term Goal 2 (Week 1): Pt will transfer w/c <> toilet with min A OT Short Term Goal 2 - Progress (Week 1): Met OT Short Term Goal 3 (Week 1): Pt will complete 1 grooming task in standing to increase standing tolerance for ADLs OT Short Term Goal 3 - Progress (Week 1): Not met OT Short Term Goal 4 (Week 1): Pt will manage w/c parts independently during functional tasks/ transfers OT Short Term Goal 4 - Progress (Week 1): Met Week 2:  OT Short Term Goal 1 (Week 2): Pt will complete one grooming task in standing to increase functional standing balance/ tolerance OT Short Term Goal 2 (Week 2): Pt will complete 3/3 toileting tasks with min A OT Short Term Goal 3 (Week 2): Pt will transfer to toilet with close supervision to decrease caregiver burden OT Short Term Goal 4 (Week 2): Will initiate shower transfer goal utilizing tub/shower combination with tub transfer bench in prep for d/c  Skilled Therapeutic Interventions/Progress Updates:  Pain: Pt with no complaints of pain, however pt with complaints of cramping and muscle spasms in UB (arms and neck).   Upon entering room, pt found seated/reclined in recliner. Pt willing to work with therapist. Pt lowered legs to the ground and raised head up. Pt worked on donning of bilateral shoes (right with AFO) by crossing over legs. Pt able to do this with moderate assistance from therapist. Pt then performed scoot/squat pivot transfer recliner to w/c. Pt propelled self from room to therapy gym. Here, therapist modified w/c leg rests to better fit him for comfort and  safety. While seated in w/c, pt became increasingly sweaty, pt reported a "cold sweat". Pt reported his cramps and spasms increased as well. BP=145/84. Therapist doffed right shoe with AFO and BP=143/80. Therapist doffed left shoe and BP=144/84. Once both shoes doffed, pt with decreased cramps and no more sweating. ?Autonomic dysreflexia? Pt propelled self back to room and notified his nurse of BP readings and symptoms with and without shoes donned. Pt transferred w/c to EOB to supine with min assist for transfer w/c to EOB and mod assist for sit to supine due to needed assistance/management of BLEs. At end of session, left pt supine in bed with NT present to perform in/out cath.   Therapy Documentation Precautions:  Precautions Precautions: Fall Precaution Comments: RLE hemiparesis Restrictions Weight Bearing Restrictions: No  Vital Signs: Therapy Vitals Temp: 97.7 F (36.5 C) Temp Source: Axillary Pulse Rate: 87 Resp: 20 BP: (!) 133/106 (RN notified) Patient Position (if appropriate): Lying Oxygen Therapy SpO2: 97 % O2 Device: Not Delivered  See Function Navigator for Current Functional Status.  Therapy/Group: Individual Therapy  Chrys Racer , MS, OTR/L, CLT  06/09/2016, 4:17 PM

## 2016-06-09 NOTE — Progress Notes (Signed)
Urine culture results noted "multiple species present, suggest recollection". Urine originally collected from I+O cath specimen. Second specimen was collected per recommendation during I+O cath procedure. Perry Morrison, Perry Morrison

## 2016-06-10 ENCOUNTER — Inpatient Hospital Stay (HOSPITAL_COMMUNITY): Payer: Self-pay | Admitting: Occupational Therapy

## 2016-06-10 ENCOUNTER — Inpatient Hospital Stay (HOSPITAL_COMMUNITY): Payer: Self-pay | Admitting: Physical Therapy

## 2016-06-10 ENCOUNTER — Inpatient Hospital Stay (HOSPITAL_COMMUNITY): Payer: Medicaid Other | Admitting: Physical Therapy

## 2016-06-10 LAB — URINE CULTURE

## 2016-06-10 NOTE — Progress Notes (Signed)
[facility] PHYSICAL MEDICINE & REHABILITATION     PROGRESS NOTE    Subjective/Complaints:  No complaints this morning. Got suppository, sitting on toilet trying to empty. Bladder still not emptying. Urine with odor per RN  ROS: pt denies nausea, vomiting, diarrhea, cough, shortness of breath or chest pain     Objective: Vital Signs: Blood pressure 127/90, pulse 74, temperature 98.6 F (37 C), temperature source Oral, resp. rate 18, height 6\' 1"  (1.854 m), weight 97.6 kg (215 lb 2.7 oz), SpO2 100 %. No results found. No results for input(s): WBC, HGB, HCT, PLT in the last 72 hours. No results for input(s): NA, K, CL, GLUCOSE, BUN, CREATININE, CALCIUM in the last 72 hours.  Invalid input(s): CO CBG (last 3)  No results for input(s): GLUCAP in the last 72 hours.  Wt Readings from Last 3 Encounters:  06/10/16 97.6 kg (215 lb 2.7 oz)  05/26/16 102.1 kg (225 lb)    Physical Exam:  HENT:  Head: Normocephalic and atraumatic.  Eyes: Conjunctivae and EOM are normal. Left eye exhibits no discharge.  Neck: Normal range of motion. Neck supple. No thyromegaly present.  Cardiovascular: RRR  Respiratory: CTA B GI: Soft. Bowel sounds are normal. He exhibits no distension.  Genitourinary: no changes  Musculoskeletal: He exhibits no edema or tenderness.  Right thigh non-tender Neurological:  Neurological: He is alert and oriented to person, place, and time.  Pt alert and oriented.  B/l UE 5/5.  RLE: still with tr to 1/5 HF, 0/5 distally--no change LLE: 2+/5 HF, 3- KE and 3/5 ADF/PF---no change Sensation intact to light touch throughout DTRs 1+ LLE.   Skin: Skin remains warm and dry.  Psych: pleasant and appropriate   Assessment/Plan: 1. Functional and mobility deficits secondary to thoracic myelopathy/transverse myelitis which require 3+ hours per day of interdisciplinary therapy in a comprehensive inpatient rehab setting. Physiatrist is providing close team supervision and 24  hour management of active medical problems listed below. Physiatrist and rehab team continue to assess barriers to discharge/monitor patient progress toward functional and medical goals.  Function:  Bathing Bathing position   Position: Shower  Bathing parts Body parts bathed by patient: Right arm, Right upper leg, Left arm, Left upper leg, Chest, Front perineal area, Right lower leg, Abdomen, Left lower leg, Buttocks, Back    Bathing assist Assist Level: Set up   Set up : To obtain items  Upper Body Dressing/Undressing Upper body dressing   What is the patient wearing?: Pull over shirt/dress     Pull over shirt/dress - Perfomed by patient: Thread/unthread right sleeve, Thread/unthread left sleeve, Put head through opening, Pull shirt over trunk          Upper body assist Assist Level: Set up   Set up : To obtain clothing/put away  Lower Body Dressing/Undressing Lower body dressing   What is the patient wearing?: Pants, Ted Hose, Shoes, AFO Underwear - Performed by patient: Thread/unthread left underwear leg, Pull underwear up/down Underwear - Performed by helper: Thread/unthread right underwear leg, Thread/unthread left underwear leg, Pull underwear up/down Pants- Performed by patient: Thread/unthread left pants leg, Pull pants up/down Pants- Performed by helper: Thread/unthread right pants leg, Thread/unthread left pants leg, Pull pants up/down   Non-skid slipper socks- Performed by helper: Don/doff right sock, Don/doff left sock     Shoes - Performed by patient: Fasten right, Fasten left Shoes - Performed by helper: Don/doff right shoe, Don/doff left shoe, Fasten right, Fasten left AFO - Performed by patient:  Don/doff right AFO     TED Hose - Performed by helper: Don/doff right TED hose, Don/doff left TED hose  Lower body assist Assist for lower body dressing: Touching or steadying assistance (Pt > 75%)      Toileting Toileting Toileting activity did not occur: No  continent bowel/bladder event Toileting steps completed by patient: Performs perineal hygiene Toileting steps completed by helper: Adjust clothing prior to toileting, Adjust clothing after toileting Toileting Assistive Devices: Grab bar or rail  Toileting assist Assist level:  (Standing in STEADY)   Transfers Chair/bed transfer Chair/bed transfer activity did not occur: N/A Chair/bed transfer method: Squat pivot Chair/bed transfer assist level: Touching or steadying assistance (Pt > 75%) Chair/bed transfer assistive device: Armrests     Locomotion Ambulation     Max distance: 5 Assist level: 2 helpers   Wheelchair   Type: Manual Max wheelchair distance: 32700ft  Assist Level: Supervision or verbal cues  Cognition Comprehension Comprehension assist level: Follows complex conversation/direction with no assist  Expression Expression assist level: Expresses complex ideas: With no assist  Social Interaction Social Interaction assist level: Interacts appropriately with others with medication or extra time (anti-anxiety, antidepressant).  Problem Solving Problem solving assist level: Solves complex problems: Recognizes & self-corrects  Memory Memory assist level: Complete Independence: No helper   Medical Problem List and Plan: 1.  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   -continue CIR therapies             -prednisone completed    2.  DVT Prophylaxis/Anticoagulation: SCDs. Vascular study negative  -conitnue q12 lovenox 3. Pain Management: Hydrocodone as needed 4. Hypertension. Norvasc 10 mg daily Monitor when out of bed 5. Neuropsych: This patient is capable of making decisions on his own behalf.  -? Episodic anxiety   6. Skin/Wound Care: Routine skin checks 7. Fluids/Electrolytes/Nutrition:encourage PO  -recent labs stable  8.CRI stage III. Follow-up chemistries next week 9.  Neurogenic bowel bladder.  -continue voiding trial. No voids yets.    - continue urecholine at 50mg  TID as long as tolerated   -I/o cath prn  -ua suspicious. ucx multi-species===another specimen collected  -having continent bowel movements   -linzess addedwith results-continue for now    -convert to afordable OTC option prior to dc   LOS (Days) 9 A FACE TO FACE EVALUATION WAS PERFORMED  Ranelle OysterSWARTZ,Richardson Dubree T, MD 06/10/2016 8:36 AM

## 2016-06-10 NOTE — Progress Notes (Signed)
Physical Therapy Session Note  Patient Details  Name: Perry Morrison MRN: [id number] Date of Birth: [birth year]  Today's Date: 06/10/2016 PT Group Time: 0915-1000 PT Group Time Calculation (min): 45 min       Therapy Documentation Precautions:  Precautions Precautions: Fall Precaution Comments: RLE hemiparesis Restrictions Weight Bearing Restrictions: No   Patient denied any pain     Group participation   Patient performed lateral transfer to and from mat block practice 5x and wheelchair min assist. Verbal cues for wheelchair management and set-up and parts management.   Patient sat edge of mat independently  Wheelchair skills focusing propulsion, 90 degree, maintain straight trajectory, and drag turns all performed with close supervision.   Patient negotiationed 15 foot ramp in wheelchair min assist  Patient propelled wheelchair to and from gym 250 feet mod I   Patient returned to room at end of session with all needs met resting comfortably. Education provided throughout session on proper technique and sequence, importance of posture and controlled breathing during transfers as well more energy efficient wheelchair propulsion to improve sequence and technique.     See Function Navigator for Current Functional Status.   Therapy/Group: Group Therapy  Retta Diones 06/10/2016, 2:06 PM

## 2016-06-10 NOTE — Progress Notes (Signed)
Occupational Therapy Session Note  Patient Details  Name: Perry Morrison MRN: [id number] Date of Birth: 09/24/1960  Today's Date: 06/10/2016 OT Individual Time: [id number] OT Individual Time Calculation (min): 9 min    Short Term Goals: Week 2:  OT Short Term Goal 1 (Week 2): Pt will complete one grooming task in standing to increase functional standing balance/ tolerance OT Short Term Goal 2 (Week 2): Pt will complete 3/3 toileting tasks with min A OT Short Term Goal 3 (Week 2): Pt will transfer to toilet with close supervision to decrease caregiver burden OT Short Term Goal 4 (Week 2): Will initiate shower transfer goal utilizing tub/shower combination with tub transfer bench in prep for d/c  Skilled Therapeutic Interventions/Progress Updates: Patient complained of extreme fatigue after therapies earlier today but concurred to participate in patient education (use of heat/cold for discomfort - pain/spasms, otherwise) and calling for meds and sleeping meds when/if needed.   He was scheduled for 45 minutes.  Patient was left supine withhead of bed elevated and in the care of his girlfriend upon exit from patient room     Therapy Documentation Precautions:  Precautions Precautions: Fall Precaution Comments: RLE hemiparesis Restrictions Weight Bearing Restrictions: No General: General OT Amount of Missed Time:  (36)  Pain::shoulder spasms are starting, and I will call my nurse for meds."    See Function Navigator for Current Functional Status.   Therapy/Group: Individual Therapy  [name], [place]ixt[facility] CenterYeary 06/10/2016, 6:54 PM

## 2016-06-10 NOTE — Patient Care Conference (Signed)
Inpatient RehabilitationTeam Conference and Plan of Care Update Date: 06/09/2016   Time: 2:15 PM    Patient Name: Perry Morrison      Medical Record Number: [id number]  Date of Birth: 1960-08-05 Sex: Male         Room/Bed: 4W02C/4W02C-01 Payor Info: Payor: MEDICAID POTENTIAL / Plan: MEDICAID POTENTIAL / Product Type: *No Product type* /    Admitting Diagnosis: Thoracie Myelopathy  Admit Date/Time:  06/01/2016  4:11 PM Admission Comments: No comment available   Primary Diagnosis:  Incomplete paraplegia (HCC) Principal Problem: Incomplete paraplegia [facility])  Patient Active Problem List   Diagnosis Date Noted  . 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 Weakness of right leg   . Neurogenic bowel   . Neurogenic bladder   . Incomplete paraplegia (HCC)   . GI bleed 05/30/2016  . Hypertension 05/26/2016  . Constipation 05/26/2016  . Abnormal MRI, thoracic spine 05/26/2016  . Myelitis (HCC)   . Right leg weakness   . Urinary retention   . AKI (acute kidney injury) (HCC)   . Aneurysm (HCC)   . CKD (chronic kidney disease), stage III     Expected Discharge Date: Expected Discharge Date: 06/23/16  Team Members Present: Physician leading conference: Dr. Faith Rogue Social Worker Present: Amada Jupiter, LCSW Nurse Present: Carmie End, RN PT Present: Karolee Stamps, Marcene Brawn, PT OT Present: Johnsie Cancel, OT SLP Present: Feliberto Gottron, SLP PPS Coordinator present : Tora Duck, RN, CRRN     Current Status/Progress Goal Weekly Team Focus  Medical   slow neurological progress. has finished steroids. moving bowels yet requires I/O urinary caths  continued education, increase functional mobility  bladder emptying. bowels, education   Bowel/Bladder   In and out cath q4 for high volumes, LBM 1.29-18  Continent of bowel and bladder, regular bowel pattern  Assist with tolieting needs   Swallow/Nutrition/ Hydration             ADL's   Min A squat pivots; min-max LB dressing depending on  fatigue/ pain/ technique used. Set-up UB bathing/dressing and grooming  Supervision- mod I overall; min A shower transfers  ADL re-training from w/c level; functional transfers; education   Mobility   min A squat pivot transfers, mod I w/c mobility, max A+2 gait/stairs  mod I w/c level; supervision to min assist for short distance gait; min assist stairs   neuro re-ed RLE, functional mobility training, standing tolerance/balance, w/c parts management, activity tolerance   Communication             Safety/Cognition/ Behavioral Observations            Pain   No c/o pain   pain less than or equal to 2  Assess pain q shift and prn and treat prn   Skin   CDI  No new skin breakdown/infection  Assess skin q shift and prn      *See Care Plan and progress notes for long and short-term goals.  Barriers to Discharge: neurogenic bladder, right leg weakness persists    Possible Resolutions to Barriers:  potential orthotics and adaptive devices. i/o caths    Discharge Planning/Teaching Needs:  Pt to d/c home with grandmother who can provide supervision      Team Discussion:  Seems to be regaining some bowel function?  Limited bladder sensation.  Steroids completed; spasms yesterday and intermittently today.  Will check if possible UTI.  Still NO activation in right leg which is concerning for longer term recovery.  Pt very  motivated.  Revisions to Treatment Plan:  None   Continued Need for Acute Rehabilitation Level of Care: The patient requires daily medical management by a physician with specialized training in physical medicine and rehabilitation for the following conditions: Daily direction of a multidisciplinary physical rehabilitation program to ensure safe treatment while eliciting the highest outcome that is of practical value to the patient.: Yes Daily medical management of patient stability for increased activity during participation in an intensive rehabilitation regime.: Yes Daily  analysis of laboratory values and/or radiology reports with any subsequent need for medication adjustment of medical intervention for : Post surgical problems;Neurological problems  Sherica Paternostro 06/11/2016, 12:27 PM

## 2016-06-10 NOTE — Progress Notes (Signed)
Physical Therapy Session Note  Patient Details  Name: Perry Morrison MRN: [id number] Date of Birth: [birth year]  Today's Date: 06/10/2016 PT Individual Time: 0810-0840 PT Individual Time Calculation (min): 30 min   Short Term Goals: Week 1:  PT Short Term Goal 1 (Week 1): Pt will complete basic transfers with min assist PT Short Term Goal 2 (Week 1): Pt will be able to gait x 25' with LRAD with max of 1 (+2 for w/c follow/safety) PT Short Term Goal 3 (Week 1): Pt will be able to negotiate 1 step with mod assist PT Short Term Goal 4 (Week 1): Pt will be able to manage w/c parts to set up for transfers with supervision cues  Skilled Therapeutic Interventions/Progress Updates: Pt in BR upon entering. Transferred to w/c via Stedy with NT. Leg rests applied by PTA for time management. Pt propelled to rehab gym. Lateral scoot to NuStep with minA for LE placement. Performed NuStep L1 x 6 min with x3 brief breaks due to fatigue. Pt returned to w/c in same manner, and returned to room with all current needs met.      Therapy Documentation Precautions:  Precautions Precautions: Fall Precaution Comments: RLE hemiparesis Restrictions Weight Bearing Restrictions: No    Therapy/Group: Individual Therapy  Margart Zemanek  Raeden Schippers, PTA  06/10/2016, 12:17 PM

## 2016-06-10 NOTE — Progress Notes (Signed)
Occupational Therapy Session Note  Patient Details  Name: Perry GraffJamie Morrison MRN: [id number] Date of Birth: 11/04/1960  Today's Date: 06/10/2016 OT Individual Time: 1005-1100 and 1415-1500 OT Individual Time Calculation (min): 55 min and 45 min   Short Term Goals: Week 2:  OT Short Term Goal 1 (Week 2): Pt will complete one grooming task in standing to increase functional standing balance/ tolerance OT Short Term Goal 2 (Week 2): Pt will complete 3/3 toileting tasks with min A OT Short Term Goal 3 (Week 2): Pt will transfer to toilet with close supervision to decrease caregiver burden OT Short Term Goal 4 (Week 2): Will initiate shower transfer goal utilizing tub/shower combination with tub transfer bench in prep for d/c  Skilled Therapeutic Interventions/Progress Updates:    Session One: Pt seen for OT ADL bathing/dressing session. Pt received in w/c with hand off from PT. Pt agreeable to tx session and desiring to shower.  Pt felt energy level was enough to complete squat pivot transfers forshower transfer vs using STEADY. Mod A squat pivot completed to tub bench. He completed lateral leans to doff clothing, LOB episode requiring use of wall/ grab bars to regain balance. He bathed with set-up assist seated on tub bench. He had incontinent BM while in shower, assist required for thorough buttock hygiene following BM. He desired to use STEADY for exit from shower, requiring mod A to stand into STEADY from tub bench.  He dressed seated in w/c, completed w/c push up while therapist pulled pants up. Educated regarding lateral leans for clothing management. Pt attempted lateral leans in chair to pull pants up, however, fatigued and requested assist. Pt left seated in w/c at end of session, all needs in reach. Discussed d/c planning and who possible caregivers would be/ their ability to provide physical assist. Pt with planned d/c home to grandmother's however, pt thinking that this would be too much of a  burden on her and she is not able to provide physical assist that maybe required. Pt verbalizing potential for SNF placement given his current level of function. Will make CSW aware as well as rest of medical team.   Session Two: Pt seen for OT session focusing on education, ADL re-training, and functional transfers. Pt in supine upon arrival, voicing increased fatigue, however, willing to participate.  Discussed at length d/c planning, options of SNF and what that would/ would not include given pt's insurance/ lack of insurance. Pt still not feeling as if going to Grandmother's home is feasible. Pt also wondering if second medical opinion would be beneficial. He then transferred to EOB with min A for management of LEs. Sitting EOB, pt completed lateral leans to manage clothing in simulation of LB/ toileting tasks. Completed with supervision and VCs for technique.  Completed x2 squat pivot transfers EOB> w/c and w/c> recliner with VCs for head/hip relationship. Pt left seated in recliner at end of session, all needs in reach, K-pad applied for comfort.   Therapy Documentation Precautions:  Precautions Precautions: Fall Precaution Comments: RLE hemiparesis Restrictions Weight Bearing Restrictions: No Pain:   No/ denies pain  See Function Navigator for Current Functional Status.   Therapy/Group: Individual Therapy  [name], Bayyinah Dukeman C 06/10/2016, 7:03 AM

## 2016-06-11 ENCOUNTER — Inpatient Hospital Stay (HOSPITAL_COMMUNITY): Payer: Medicaid Other | Admitting: Physical Therapy

## 2016-06-11 ENCOUNTER — Inpatient Hospital Stay (HOSPITAL_COMMUNITY): Payer: Self-pay

## 2016-06-11 ENCOUNTER — Inpatient Hospital Stay (HOSPITAL_COMMUNITY): Payer: Medicaid Other | Admitting: Occupational Therapy

## 2016-06-11 MED ORDER — BACLOFEN 10 MG PO TABS
10.0000 mg | ORAL_TABLET | Freq: Three times a day (TID) | ORAL | Status: DC
  Administered 2016-06-11 – 2016-06-25 (×42): 10 mg via ORAL
  Filled 2016-06-11 (×43): qty 1

## 2016-06-11 MED ORDER — SENNOSIDES-DOCUSATE SODIUM 8.6-50 MG PO TABS
2.0000 | ORAL_TABLET | Freq: Two times a day (BID) | ORAL | Status: DC
  Administered 2016-06-11 – 2016-07-10 (×55): 2 via ORAL
  Filled 2016-06-11 (×57): qty 2

## 2016-06-11 NOTE — Progress Notes (Signed)
[place] [facility] PHYSICAL MEDICINE & REHABILITATION     PROGRESS NOTE    Subjective/Complaints:  No changes. Bladder not emptying---caths q4-6. Had some muscle spasms last night  ROS: pt denies nausea, vomiting, diarrhea, cough, shortness of breath or chest pain      Objective: Vital Signs: Blood pressure (!) 148/79, pulse 79, temperature 98.6 F (37 C), temperature source Oral, resp. rate 18, height 6\' 1"  (1.854 m), weight 97.6 kg (215 lb 2.7 oz), SpO2 100 %. No results found. No results for input(s): WBC, HGB, HCT, PLT in the last 72 hours. No results for input(s): NA, K, CL, GLUCOSE, BUN, CREATININE, CALCIUM in the last 72 hours.  Invalid input(s): CO CBG (last 3)  No results for input(s): GLUCAP in the last 72 hours.  Wt Readings from Last 3 Encounters:  06/10/16 97.6 kg (215 lb 2.7 oz)  05/26/16 102.1 kg (225 lb)    Physical Exam:  HENT:  Head: Normocephalic and atraumatic.  Eyes: Conjunctivae and EOM are normal. Left eye exhibits no discharge.  Neck: Normal range of motion. Neck supple. No thyromegaly present.  Cardiovascular:RRR  Respiratory: RRR GI: Soft. Bowel sounds are normal. He exhibits no distension.  Genitourinary: no changes  Musculoskeletal: He exhibits no edema or tenderness.  Right thigh non-tender Neurological:  Neurological: He is alert and oriented to person, place, and time.  Pt alert and oriented.  B/l UE 5/5.  RLE: tr/5 HF, 0/5 distally--no changes  LLE: 2+/5 HF, 3- KE and 3/5 ADF/PF---unchanged Sensation intact to light touch throughout DTRs 1+ LLE.  no resting tone Skin: Skin remains warm and dry.  Psych: pleasant and appropriate   Assessment/Plan: 1. Functional and mobility deficits secondary to thoracic myelopathy/transverse myelitis which require 3+ hours per day of interdisciplinary therapy in a comprehensive inpatient rehab setting. Physiatrist is providing close team supervision and 24 hour management of active medical problems  listed below. Physiatrist and rehab team continue to assess barriers to discharge/monitor patient progress toward functional and medical goals.  Function:  Bathing Bathing position   Position: Shower  Bathing parts Body parts bathed by patient: Right arm, Right upper leg, Left arm, Left upper leg, Chest, Front perineal area, Right lower leg, Abdomen, Left lower leg, Back Body parts bathed by helper: Buttocks  Bathing assist Assist Level: Touching or steadying assistance(Pt > 75%)   Set up : To obtain items  Upper Body Dressing/Undressing Upper body dressing   What is the patient wearing?: Pull over shirt/dress     Pull over shirt/dress - Perfomed by patient: Thread/unthread right sleeve, Thread/unthread left sleeve, Put head through opening, Pull shirt over trunk          Upper body assist Assist Level: No help, No cues   Set up : To obtain clothing/put away  Lower Body Dressing/Undressing Lower body dressing   What is the patient wearing?: Pants, Non-skid slipper socks Underwear - Performed by patient: Thread/unthread left underwear leg, Pull underwear up/down Underwear - Performed by helper: Thread/unthread right underwear leg, Thread/unthread left underwear leg, Pull underwear up/down Pants- Performed by patient: Thread/unthread left pants leg Pants- Performed by helper: Thread/unthread right pants leg, Pull pants up/down   Non-skid slipper socks- Performed by helper: Don/doff right sock, Don/doff left sock     Shoes - Performed by patient: Fasten right, Fasten left Shoes - Performed by helper: Don/doff right shoe, Don/doff left shoe, Fasten right, Fasten left AFO - Performed by patient: Don/doff right AFO     TED Hose -  Performed by helper: Don/doff right TED hose, Don/doff left TED hose  Lower body assist Assist for lower body dressing: Touching or steadying assistance (Pt > 75%)      Toileting Toileting Toileting activity did not occur: No continent bowel/bladder  event Toileting steps completed by patient: Performs perineal hygiene Toileting steps completed by helper: Adjust clothing prior to toileting, Performs perineal hygiene, Adjust clothing after toileting Toileting Assistive Devices: Grab bar or rail  Toileting assist Assist level:  (Standing in STEADY)   Transfers Chair/bed transfer Chair/bed transfer activity did not occur: N/A Chair/bed transfer method: Squat pivot Chair/bed transfer assist level: Touching or steadying assistance (Pt > 75%) Chair/bed transfer assistive device: Armrests     Locomotion Ambulation     Max distance: 5 Assist level: 2 helpers   Wheelchair   Type: Manual Max wheelchair distance: 352ft  Assist Level: Supervision or verbal cues  Cognition Comprehension Comprehension assist level: Follows complex conversation/direction with no assist  Expression Expression assist level: Expresses complex ideas: With no assist  Social Interaction Social Interaction assist level: Interacts appropriately with others with medication or extra time (anti-anxiety, antidepressant).  Problem Solving Problem solving assist level: Solves complex problems: Recognizes & self-corrects  Memory Memory assist level: Complete Independence: No helper   Medical Problem List and Plan: 1.  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   -continue CIR therapies             -prednisone completed    2.  DVT Prophylaxis/Anticoagulation: SCDs. Vascular study negative  -conitnue q12 lovenox 3. Pain Management: Hydrocodone as needed 4. Hypertension. Norvasc 10 mg daily Monitor when out of bed 5. Neuropsych: This patient is capable of making decisions on his own behalf.  -? Episodic anxiety   6. Skin/Wound Care: Routine skin checks 7. Fluids/Electrolytes/Nutrition:encourage PO  -recent labs stable  8.CRI stage III. Follow-up chemistries next week 9.  Neurogenic bowel bladder.  -continue voiding trial. No voids yets.    - continue urecholine at 50mg  TID for now   -I/o cath prn  -second ucx negative  -having continent bowel movements   -linzess added with results-continue for now    -convert to afordable OTC option prior to dc   LOS (Days) 10 A FACE TO FACE EVALUATION WAS PERFORMED  [name], MD 06/11/2016 8:24 AM

## 2016-06-11 NOTE — Progress Notes (Signed)
Physical Therapy Session Note  Patient Details  Name: Perry GraffJamie Lartigue MRN: [id number] Date of Birth: 04/25/1961  Today's Date: 06/11/2016 PT Individual Time: [id number] PT Individual Time Calculation (min): 30 min   Short Term Goals: Week 1:  PT Short Term Goal 1 (Week 1): Pt will complete basic transfers with min assist PT Short Term Goal 2 (Week 1): Pt will be able to gait x 25' with LRAD with max of 1 (+2 for w/c follow/safety) PT Short Term Goal 3 (Week 1): Pt will be able to negotiate 1 step with mod assist PT Short Term Goal 4 (Week 1): Pt will be able to manage w/c parts to set up for transfers with supervision cues  Skilled Therapeutic Interventions/Progress Updates:  Pt received in recliner requesting to finish lunch; pt missed 30 minutes of skilled PT treatment.  Pt agreeable to tx later and requesting to be cathed to empty bladder. NT made aware & pt completed sit<>stand transfers with max assist with stedy and required total assist for clothing management. Pt transferred recliner>w/c via stedy lift. Pt requires assistance for w/c parts management. Pt requesting to go to subway & propelled w/c room<>subway with BUE & supervision for cardiovascular endurance training. Pt requires assistance when propelling w/c over carpeted surfaces and rest breaks 2/2 fatigue; pt would benefit from continued practice with w/c mobility especially in community settings. At end of session pt left sitting in w/c in room with all needs within reach.   Pt denied c/o pain.      Therapy Documentation Precautions:  Precautions Precautions: Fall Precaution Comments: RLE hemiparesis Restrictions Weight Bearing Restrictions: No   General: PT Amount of Missed Time (min): 30 Minutes PT Missed Treatment Reason:  (pt requesting to eat lunch)   See Function Navigator for Current Functional Status.   Therapy/Group: Individual Therapy  Sandi MariscalVictoria M Avy Barlett 06/11/2016, 5:20 PM

## 2016-06-11 NOTE — Plan of Care (Signed)
Problem: RH BLADDER ELIMINATION Goal: RH STG MANAGE BLADDER WITH ASSISTANCE STG Manage Bladder With min  Assistance   Outcome: Not Progressing Straight cath. q4-6hrs.

## 2016-06-11 NOTE — Progress Notes (Signed)
Social Work Patient ID: Perry Morrison, male   DOB: [birth year], 56 y.o.   MRN: [id number]   Met with pt today to review team conference information and discuss d/c planning issues.  Pt aware that team continues to aim toward a d/c date of 2/13.  Pt expressing concern about d/c plan to go to his grandmother's home and asks about possibility of SNF from here.  Had very frank discussion with him about the reality of SNF given that he is uninsured (i.e. Facility could be very far from home, little therapy will be offered, etc.).  Pt listening and states he understands this and is not re-considering home with grandmother.  He wants to talk with other family members as well about providing support.   Of note, he reports that grandmother is 56 yrs old and in "excellent health" and completely independent.  Will follow up with him again tomorrow.  At this point, d/c plan remains for pt to d/c to grandmother's home.  Nan Maya, LCSW

## 2016-06-11 NOTE — Progress Notes (Signed)
Occupational Therapy Note  Patient Details  Name: Perry Morrison MRN: [id number] Date of Birth: 01/13/1961  Today's Date: 06/11/2016 OT Individual Time: 1130-1200 OT Individual Time Calculation (min): 30 min  Pt denied pain Individual therapy  Pt resting in recliner.  Pt engaged in LLE resistive therexc with focus on hip flexors, adductor/abductor, quads, and hamstring muscle groups.  Pt remained in recliner with all needs within reach.   Lavone NeriLanier, Caldwell Kronenberger [facility] 06/11/2016, 12:25 PM

## 2016-06-11 NOTE — Progress Notes (Signed)
Physical Therapy Weekly Progress Note  Patient Details  Name: Perry Morrison MRN: [id number] Date of Birth: [birth year]  Beginning of progress report period: June 02, 2016 End of progress report period: June 11, 2016  Today's Date: 06/11/2016 PT Individual Time: 0930-1000 PT Individual Time Calculation (min): 30 min   Patient has met 1 of 4 short term goals.  Pt is making fair progress towards goals. Pt continues to limited by fatigue but would benefit from continued treatment to focus on safety during w/c mobility, transfers (w/c<>bed, car transfers), bed mobility and to assist in d/c planning.    Patient continues to demonstrate the following deficits muscle weakness, decreased cardiorespiratoy endurance, and decreased sitting balance, decreased standing balance, decreased postural control and decreased balance strategies and therefore will continue to benefit from skilled PT intervention to increase functional independence with mobility.  Patient progressing toward long term goals..  Continue plan of care.  PT Short Term Goals Week 1:  PT Short Term Goal 1 (Week 1): Pt will complete basic transfers with min assist PT Short Term Goal 1 - Progress (Week 1): Progressing toward goal (squat pivot with min assist, max assist for sit<>stand) PT Short Term Goal 2 (Week 1): Pt will be able to gait x 25' with LRAD with max of 1 (+2 for w/c follow/safety) PT Short Term Goal 2 - Progress (Week 1): Not met PT Short Term Goal 3 (Week 1): Pt will be able to negotiate 1 step with mod assist PT Short Term Goal 3 - Progress (Week 1): Not met PT Short Term Goal 4 (Week 1): Pt will be able to manage w/c parts to set up for transfers with supervision cues PT Short Term Goal 4 - Progress (Week 1): Progressing toward goal Week 2:  PT Short Term Goal 1 (Week 2): Pt will be able to manage w/c parts to set up for transfers with supervision cues PT Short Term Goal 2 (Week 2): Pt will complete squat pivot  transfers with supervision.  PT Short Term Goal 3 (Week 2): Pt will perform bed mobility with supervision.   Therapy Documentation Precautions:  Precautions Precautions: Fall Precaution Comments: RLE hemiparesis Restrictions Weight Bearing Restrictions: No  Waunita Schooner 06/11/2016, 2:00 PM

## 2016-06-11 NOTE — Progress Notes (Signed)
Occupational Therapy Session Note  Patient Details  Name: Perry Morrison MRN: [id number] Date of Birth: 04/02/1961  Today's Date: 06/11/2016 OT Individual Time: [id number] and 1500-1530 OT Individual Time Calculation (min): 60 min and 30 min   Short Term Goals: Week 2:  OT Short Term Goal 1 (Week 2): Pt will complete one grooming task in standing to increase functional standing balance/ tolerance OT Short Term Goal 2 (Week 2): Pt will complete 3/3 toileting tasks with min A OT Short Term Goal 3 (Week 2): Pt will transfer to toilet with close supervision to decrease caregiver burden OT Short Term Goal 4 (Week 2): Will initiate shower transfer goal utilizing tub/shower combination with tub transfer bench in prep for d/c  Skilled Therapeutic Interventions/Progress Updates:    Session One: Pt seen for OT session focusing on UE strengthening, neuro re-ed, and therapeutic activity. Pt sitting up in w/c upon arrival finishing cathing by nursing. Pt reports having washed up earlier this A.M. And declining bathing/dressing session. He self propelled w/c to therapy gym mod I.  Throughout session completed transfers via squat/ scoot pivot with guarding assist and VCs for head/hip relation, pt with difficulty getting full clearance during transfers.  Seated EOM, completed x2 sets of 10 reps seated push-ups utilizing push-up blocks, rest breaks provided btwn sets.  Standing trial from EOM with mod A to stand, assist to block R knee. Pt able to let go with L UE for ~1 second before regrasping RW. Heavy steadying assist required when standing with only one UE support. Completed standing trial again in // bars, pt standing with assist only to block B knees, and heavy reliance on UEs. Returned to mat and placed in prone position with pillow under chest, progressed to used small bolster under chest. Pt voiced feeling increased comfort with stretch in chest and lower back. With +2 assist, pt placed in quadraped  position, min A to maintain position. Pt returned to prone position with bolster. Left in prone on mat with hand off to PT.   Session Two: Pt seen for OT session focusing on education and d/c planning. Pt sitting up in w/c upon arrival with CSW present. Discussed together d/c plans and following up therapy services. Given lack of resources that would be offered at next venue of care, pt thinking it will be best to d/c to grandmother's home as originally planned. Provided pt with home measurement sheet and educated pt how to instruct family members to complete.  Discussed DME including custom vs. Standard w/c and pros/cons of each option.  Pt left seated in w/c at end of session, awaiting next PT session.   Therapy Documentation Precautions:  Precautions Precautions: Fall Precaution Comments: RLE hemiparesis Restrictions Weight Bearing Restrictions: No Pain:   No/ denies pain  See Function Navigator for Current Functional Status.   Therapy/Group: Individual Therapy  [name], Jamylah Marinaccio C 06/11/2016, 6:36 AM

## 2016-06-11 NOTE — Progress Notes (Signed)
NT attempted to do I&O cath at approximately 1830 and patient refused. Stated he 'did not fill full at that time.'

## 2016-06-11 NOTE — Progress Notes (Signed)
Physical Therapy Session Note  Patient Details  Name: Perry Morrison MRN: [id number] Date of Birth: 12/21/1960  Today's Date: 06/11/2016 PT Individual Time: 0930-1000 and 1547-1615 PT Individual Time Calculation (min): 30 min and 28 min  Short Term Goals: Week 1:  PT Short Term Goal 1 (Week 1): Pt will complete basic transfers with min assist PT Short Term Goal 2 (Week 1): Pt will be able to gait x 25' with LRAD with max of 1 (+2 for w/c follow/safety) PT Short Term Goal 3 (Week 1): Pt will be able to negotiate 1 step with mod assist PT Short Term Goal 4 (Week 1): Pt will be able to manage w/c parts to set up for transfers with supervision cues  Skilled Therapeutic Interventions/Progress Updates:   Treatment 1: Patient prone over wedge on therapy mat, handoff from OT. Patient rolled from prone to supine to L with min A and supine > L sidelying > sitting EOM with mod A to bring RLE off bed and fully elevate trunk. Patient performed squat pivot transfer to wheelchair to R with steady assist. Standing frame x 10 min for RLE neuro re-ed via forced use including activity with RUE to facilitate increased WB through RLE and mini squats with min-mod A with harness in lowered position and BUE support. Patient assisted with managing leg rests but required assist to lift BLE on/off leg rests. Patient propelled wheelchair back to room with efficient propulsion technique. Patient requesting to transfer to toilet, utilized Stedy to transfer from wheelchair to elevated seat over commode with supervision with assist for clothing management and setup for hygiene. Patient left sitting on commode, verbalized understanding to call for nursing when finished.  Treatment 2:  Patient in wheelchair, fatigued after therapies. Performed squat pivot transfers from wheelchair to and from NuStep with steady assist. Performed NuStep using primarily BLE with LE attachments to maintain neutral alignment and intermittent use of BUE as  needed for BLE neuro re-ed, forced use, and reciprocal pattern retraining at level 1-2 x total of 15 min at slow pace with increased effort to utilize BLE before compensating with BUE and rest breaks as needed. Patient left sitting in wheelchair with all needs within reach.   Therapy Documentation Precautions:  Precautions Precautions: Fall Precaution Comments: RLE hemiparesis Restrictions Weight Bearing Restrictions: No Pain: Pain Assessment Pain Assessment: No/denies pain Pain Score: 0-No pain   See Function Navigator for Current Functional Status.   Therapy/Group: Individual Therapy  Nitya Cauthon, Prudencio PairRebecca A 06/11/2016, 10:07 AM

## 2016-06-12 ENCOUNTER — Inpatient Hospital Stay (HOSPITAL_COMMUNITY): Payer: Self-pay | Admitting: Physical Therapy

## 2016-06-12 ENCOUNTER — Inpatient Hospital Stay (HOSPITAL_COMMUNITY): Payer: Self-pay | Admitting: Occupational Therapy

## 2016-06-12 ENCOUNTER — Inpatient Hospital Stay (HOSPITAL_COMMUNITY): Payer: Medicaid Other

## 2016-06-12 ENCOUNTER — Inpatient Hospital Stay (HOSPITAL_COMMUNITY): Payer: Medicaid Other | Admitting: Occupational Therapy

## 2016-06-12 LAB — CBC
HEMATOCRIT: 42.6 % (ref 39.0–52.0)
Hemoglobin: 14.5 g/dL (ref 13.0–17.0)
MCH: 29.5 pg (ref 26.0–34.0)
MCHC: 34 g/dL (ref 30.0–36.0)
MCV: 86.8 fL (ref 78.0–100.0)
Platelets: 203 10*3/uL (ref 150–400)
RBC: 4.91 MIL/uL (ref 4.22–5.81)
RDW: 13 % (ref 11.5–15.5)
WBC: 8 10*3/uL (ref 4.0–10.5)

## 2016-06-12 LAB — BASIC METABOLIC PANEL
Anion gap: 10 (ref 5–15)
BUN: 27 mg/dL — AB (ref 6–20)
CHLORIDE: 95 mmol/L — AB (ref 101–111)
CO2: 27 mmol/L (ref 22–32)
Calcium: 9.6 mg/dL (ref 8.9–10.3)
Creatinine, Ser: 1.07 mg/dL (ref 0.61–1.24)
GFR calc Af Amer: >60 mL/min (ref >60)
GFR calc non Af Amer: >60 mL/min (ref >60)
GLUCOSE: 112 mg/dL — AB (ref 65–99)
POTASSIUM: 4.1 mmol/L (ref 3.5–5.1)
Sodium: 132 mmol/L — ABNORMAL LOW (ref 135–145)

## 2016-06-12 MED ORDER — GADOBENATE DIMEGLUMINE 529 MG/ML IV SOLN
20.0000 mL | Freq: Once | INTRAVENOUS | Status: AC | PRN
  Administered 2016-06-12: 20 mL via INTRAVENOUS

## 2016-06-12 NOTE — Progress Notes (Signed)
Occupational Therapy Session Note  Patient Details  Name: Perry Morrison MRN: [id number] Date of Birth: 08/16/1960  Today's Date: 06/12/2016 OT Individual Time: 8119-14780833-0930 OT Individual Time Calculation (min): 57 min    Short Term Goals: Week 2:  OT Short Term Goal 1 (Week 2): Pt will complete one grooming task in standing to increase functional standing balance/ tolerance OT Short Term Goal 2 (Week 2): Pt will complete 3/3 toileting tasks with min A OT Short Term Goal 3 (Week 2): Pt will transfer to toilet with close supervision to decrease caregiver burden OT Short Term Goal 4 (Week 2): Will initiate shower transfer goal utilizing tub/shower combination with tub transfer bench in prep for d/c  Skilled Therapeutic Interventions/Progress Updates:     Pt seen for OT ADL bathing/dressing session. Pt getting on toilet with assist from NT using STEADY upon arrival. Pt sitting on toilet attempt for BM. STEADY used to transfer pt to shower. He bathed seated on tub bench with set-up assist. He returned to w/c via STEADY and completed grooming/dressing tasks from w/c level. Assist provided to obtain and maintain figure four siting in chair for pt to don lotion to B LEs.  Introdcued reacher and sock aid to assist with LB dressing as pt demonstrating decreased functional sitting balance needed to reach forward to don socks/ shoes. Demonstration and min A required for use of AE.  Attempted lateral leans for clothing management. However, upon leans pt voiced need to attempt to have BM again. STEADY used to transfer pt back to toilet. Pt left seated on toilet, instructed to use pull call bell when complete.    Therapy Documentation Precautions:  Precautions Precautions: Fall Precaution Comments: RLE hemiparesis Restrictions Weight Bearing Restrictions: No Pain:   No/denies pain  See Function Navigator for Current Functional Status.   Therapy/Group: Individual Therapy  [name], Robet Crutchfield C 06/12/2016,  7:09 AM

## 2016-06-12 NOTE — Progress Notes (Addendum)
[facility]IC[facility] RE[facility]ILIT[facility]     PROGRESS NOTE    Subjective/Complaints:  Still having spasms in arms and legs. Perhaps a little better with baclofen. No voiding yet  ROS: pt denies nausea, vomiting, diarrhea, cough, shortness of breath or chest pain      Objective: Vital Signs: Blood pressure 131/70, pulse 88, temperature 98.1 F (36.7 C), temperature source Oral, resp. rate 18, height 6\' 1"  (1.854 m), weight 97.6 kg (215 lb 2.7 oz), SpO2 98 %. No results found. No results for input(s): WBC, HGB, HCT, PLT in the last 72 hours. No results for input(s): NA, K, CL, GLUCOSE, BUN, CREATININE, CALCIUM in the last 72 hours.  Invalid input(s): CO CBG (last 3)  No results for input(s): GLUCAP in the last 72 hours.  Wt Readings from Last 3 Encounters:  06/10/16 97.6 kg (215 lb 2.7 oz)  05/26/16 102.1 kg (225 lb)    Physical Exam:  HENT:  Head: Normocephalic and atraumatic.  Eyes: Conjunctivae and EOM are normal. Left eye exhibits no discharge.  Neck: Normal range of motion. Neck supple. No thyromegaly present.  Cardiovascular:RRR  Respiratory: CTA Bilaterally GI: Soft. Bowel sounds are normal. He exhibits no distension.  Genitourinary: no changes  Musculoskeletal: He exhibits no edema or tenderness.  Right thigh non-tender Neurological:  Neurological: He is alert and oriented to person, place, and time.  Pt alert and oriented.  B/l UE 5/5--appears stable.  RLE: 0/5 HF, 0/5 distally--slightly weaker   LLE: 2 to 2+/5 HF, 3- KE and 3/5 ADF/PF---slightly weaker Sensation intact to light touch in LE's DTRs 1+ LLE.  No resting tone Skin: Skin remains warm and dry.  Psych: pleasant and appropriate   Assessment/Plan: 1. Functional and mobility deficits secondary to thoracic myelopathy/transverse myelitis which require 3+ hours per day of interdisciplinary therapy in a comprehensive inpatient rehab setting. Physiatrist is providing close team supervision and  24 hour management of active medical problems listed below. Physiatrist and rehab team continue to assess barriers to discharge/monitor patient progress toward functional and medical goals.  Function:  Bathing Bathing position   Position: Shower  Bathing parts Body parts bathed by patient: Right arm, Right upper leg, Left arm, Left upper leg, Chest, Front perineal area, Right lower leg, Abdomen, Left lower leg, Back Body parts bathed by helper: Buttocks  Bathing assist Assist Level: Touching or steadying assistance(Pt > 75%)   Set up : To obtain items  Upper Body Dressing/Undressing Upper body dressing   What is the patient wearing?: Pull over shirt/dress     Pull over shirt/dress - Perfomed by patient: Thread/unthread right sleeve, Thread/unthread left sleeve, Put head through opening, Pull shirt over trunk          Upper body assist Assist Level: No help, No cues   Set up : To obtain clothing/put away  Lower Body Dressing/Undressing Lower body dressing   What is the patient wearing?: Pants, Non-skid slipper socks Underwear - Performed by patient: Thread/unthread left underwear leg, Pull underwear up/down Underwear - Performed by helper: Thread/unthread right underwear leg, Thread/unthread left underwear leg, Pull underwear up/down Pants- Performed by patient: Thread/unthread left pants leg Pants- Performed by helper: Thread/unthread right pants leg, Pull pants up/down   Non-skid slipper socks- Performed by helper: Don/doff right sock, Don/doff left sock     Shoes - Performed by patient: Fasten right, Fasten left Shoes - Performed by helper: Don/doff right shoe, Don/doff left shoe, Fasten right, Fasten left AFO - Performed by patient:  Don/doff right AFO     TED Hose - Performed by helper: Don/doff right TED hose, Don/doff left TED hose  Lower body assist Assist for lower body dressing: Touching or steadying assistance (Pt > 75%)      Toileting Toileting Toileting  activity did not occur: No continent bowel/bladder event Toileting steps completed by patient: Performs perineal hygiene Toileting steps completed by helper: Adjust clothing prior to toileting, Performs perineal hygiene, Adjust clothing after toileting Toileting Assistive Devices: Grab bar or rail  Toileting assist Assist level: Supervision or verbal cues   Transfers Chair/bed transfer Chair/bed transfer activity did not occur: N/A Chair/bed transfer method: Squat pivot Chair/bed transfer assist level: Touching or steadying assistance (Pt > 75%) Chair/bed transfer assistive device: Armrests     Locomotion Ambulation     Max distance: 5 Assist level: 2 helpers   Wheelchair   Type: Manual Max wheelchair distance: >150 ft Assist Level: Supervision or verbal cues  Cognition Comprehension Comprehension assist level: Follows complex conversation/direction with no assist  Expression Expression assist level: Expresses complex ideas: With no assist  Social Interaction Social Interaction assist level: Interacts appropriately with others with medication or extra time (anti-anxiety, antidepressant).  Problem Solving Problem solving assist level: Solves complex problems: Recognizes & self-corrects  Memory Memory assist level: Complete Independence: No helper   Medical Problem List and Plan: 1.  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 I expect this to be a long term disability given the severity of his neurological injury  -continue CIR therapies             -prednisone completed  -I am concerned that he may be weaker in the LE's. He's having lower AND upper extremity spasms now over the last few days.   -have ordered MRI with/without contrast of cervical and thoracic spine  -repeat CBC/BMET  -spoke to Neurology regarding follow up as well.   2.  DVT Prophylaxis/Anticoagulation: SCDs. Vascular  study negative  -conitnue q12 lovenox 3. Pain Management: Hydrocodone as needed  -baclofen for spasms 4. Hypertension. Norvasc 10 mg daily Monitor when out of bed 5. Neuropsych: This patient is capable of making decisions on his own behalf.  -? Episodic anxiety   6. Skin/Wound Care: Routine skin checks 7. Fluids/Electrolytes/Nutrition:encourage PO  -recent labs stable  8.CRI stage III. Follow-up chemistries today 9. Neurogenic bowel bladder.  -continue I/O caths--no sense of voiding    - will back down urecholine to 25mg  tid   -I/o cath prn  -second ucx negative  -having continent bowel movements   -linzess added with results-continue for now    -convert to afordable OTC option prior to dc   LOS (Days) 11 A FACE TO FACE EVALUATION WAS PERFORMED  Ranelle OysterSWARTZ,Iridessa Harrow T, MD 06/12/2016 9:22 AM

## 2016-06-12 NOTE — Consult Note (Addendum)
NEURO HOSPITALIST CONSULT NOTE   Requestig physician: Dr. Naaman Plummer   Reason for Consult: worseing of lower extremity weakness   History obtained from:  Patient     HPI:                                                                                                                                          Interm history: Perry Morrison is an 56 y.o. male presenting to hospital on 05/27/2016 patient presented 4 days ago to the ED as he had noted some abdominal pain and constipation.  He was given some muscle relaxers and sent home.   About 3 days ago he started to notice some right leg weakness but thought it might be from the muscle relaxers so he did not come in.  However, the right leg weakness got progressively worse and he decided to come in to the ED yesterday.  While in the ED yesterday he had an MRI of the thoracic and lumbar spine which was very abnormal: it demonstrated swelling in the midportion of the cord at T5 level with some right-sided lesion consistent with hemorrhage within the cord down to the level of T9. There is some mass effect in the cord itself from this lesion. The patient also had an lumbar puncture which showed increased RBC and protein but normal WBC and glucose. Patient was started on solumedrol. Patient has negative work-up for spinal AVM or spinal stroke. Also, neurosurgery does not think there is any surgical option. 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 On IV solumdrol D3/5 and will continue this treatment. Patient has not shown any significant improvement thus far. Patient was sent to CIR for further PT but since at CIR no further improvement has been seen. At this point in CIR he now is showing no improvement and UE spasms. Neurology has been asked to re-evaluate the patient.        Past Medical History:  Diagnosis Date  . AKI (acute kidney injury) ([place])   . 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 Hypertension   . Myelitis ([place])   . Right leg weakness   . Urinary retention     Past Surgical History:  Procedure Laterality Date  . FRACTURE SURGERY Right 1986    Family History  Problem Relation Age of Onset  . Hypertension Mother   . AAA (abdominal aortic aneurysm) Mother     Social History:  reports that he has never smoked. He has never used smokeless tobacco. He reports that he does not drink alcohol or use drugs.  No Known Allergies  MEDICATIONS:  Prior to Admission:  Prescriptions Prior to Admission  Medication Sig Dispense Refill Last Dose  . amLODipine (NORVASC) 10 MG tablet Take 1 tablet (10 mg total) by mouth daily. 30 tablet 3   . hydrocortisone (ANUSOL-HC) 25 MG suppository Place 1 suppository (25 mg total) rectally daily. 12 suppository 0   . MAGNESIUM PO Take 1 tablet by mouth daily.   Past Week at Unknown time  . Omega-3 Fatty Acids (OMEGA-3 PO) Take 1 capsule by mouth daily.   Past Week at Unknown time  . pantoprazole (PROTONIX) 40 MG tablet Take 1 tablet (40 mg total) by mouth daily. 30 tablet 0   . polyethylene glycol (MIRALAX / GLYCOLAX) packet Take 34 g by mouth 2 (two) times daily. 60 packet 3   . senna-docusate (SENOKOT-S) 8.6-50 MG tablet Take 1 tablet by mouth 2 (two) times daily. 60 tablet 3    Scheduled: . amLODipine  10 mg Oral Daily  . baclofen  10 mg Oral TID  . bethanechol  50 mg Oral TID  . bisacodyl  10 mg Rectal Q0600  . enoxaparin (LOVENOX) injection  30 mg Subcutaneous Q12H  . hydrocortisone  25 mg Rectal Daily  . pantoprazole  40 mg Oral Daily  . polyethylene glycol  17 g Oral BID  . senna-docusate  2 tablet Oral BID     ROS:                                                                                                                                       History  obtained from the patient  General ROS: negative for - chills, fatigue, fever, night sweats, weight gain or weight loss Psychological ROS: negative for - behavioral disorder, hallucinations, memory difficulties, mood swings or suicidal ideation Ophthalmic ROS: negative for - blurry vision, double vision, eye pain or loss of vision ENT ROS: negative for - epistaxis, nasal discharge, oral lesions, sore throat, tinnitus or vertigo Allergy and Immunology ROS: negative for - hives or itchy/watery eyes Hematological and Lymphatic ROS: negative for - bleeding problems, bruising or swollen lymph nodes Endocrine ROS: negative for - galactorrhea, hair pattern changes, polydipsia/polyuria or temperature intolerance Respiratory ROS: negative for - cough, hemoptysis, shortness of breath or wheezing Cardiovascular ROS: negative for - chest pain, dyspnea on exertion, edema or irregular heartbeat Gastrointestinal ROS: negative for - abdominal pain, diarrhea, hematemesis, nausea/vomiting or stool incontinence Genito-Urinary ROS: negative for - dysuria, hematuria, incontinence or urinary frequency/urgency Musculoskeletal ROS: negative for - joint swelling or muscular weakness Neurological ROS: as noted in HPI Dermatological ROS: negative for rash and skin lesion changes   Blood pressure 131/70, pulse 88, temperature 98.1 F (36.7 C), temperature source Oral, resp. rate 18, height _0  (1.854 m), weight 97.6 kg (215 lb 2.7 oz), SpO2 98 %.   Neurologic Examination:  HEENT-  Normocephalic, no lesions, without obvious abnormality.  Normal external eye and conjunctiva.  Normal TM's bilaterally.  Normal auditory canals and external ears. Normal external nose, mucus membranes and septum.  Normal pharynx. Cardiovascular- S1, S2 normal, pulses palpable throughout   Lungs- chest clear, no wheezing, rales, normal  symmetric air entry Abdomen- normal findings: bowel sounds normal Extremities- no edema Lymph-no adenopathy palpable Musculoskeletal-no joint tenderness, deformity or swelling Skin-warm and dry, no hyperpigmentation, vitiligo, or suspicious lesions  Neurological Examination Mental Status: Alert, oriented, thought content appropriate.  Speech fluent without evidence of aphasia.  Able to follow 3 step commands without difficulty. Cranial Nerves: II: Discs flat bilaterally; Visual fields grossly normal, pupils equal, round, reactive to light and accommodation III,IV, VI: ptosis not present, extra-ocular motions intact bilaterally V,VII: smile symmetric, facial light touch sensation normal bilaterally VIII: hearing normal bilaterally IX,X: uvula rises symmetrically XI: bilateral shoulder shrug XII: midline tongue extension Motor: Right : Upper extremity   5/5    Left:     Upper extremity   5/5  Lower extremity   0/5     Lower extremity   4/5 --intermittent spastic jerking of bilateral legs that are not stimulus induced.  Sensory: Pinprick and light touch intact throughout, bilaterally UE in the LE he has decreased sensation in bilateral legs to both warmth and light touch. He can feel warmth but states it is decreased and takes longer to feel.  Deep Tendon Reflexes: depressed in UE bilaterally and no KJ or AJ Plantars: Right: up going   Left: downgoing Cerebellar: normal finger-to-nose, Gait: not tested    Assessment and plan per attending neurologist  Etta Quill PA-C [facility] [phone]  06/12/2016, 12:08 PM   NMO, ANCA, ANA, ESR, ACE, SSA, SSB are all negative.   Assessment/Plan: 56 year old male with recently diagnosed thoracic cord edema with mild enhancement. This is been evaluated with SSA, SSB, ESR, CRP, Ace which are all negative. No pleocytosis which I think makes infectious myelitis much less likely. Inflammatory myelitis can have a normal WBC count but  most often does not, and I feel this is less likely. I think that this raises the suspicion for a vascular cause. With the worsening on repeat imaging I think cord infarct is less likely as this is typically monophasic. A dural AVF or spinal AVM would explain his current picture. This was investigated with MRI/MRA but I agree with Dr. Patrcia Dolly that this can miss very small lesions and we may need to consider conventional angiogram to rule this out.   1) I have discussed his case with Dr. Estanislado Pandy who will review the case and consider angiogram Monday or Tuesday.  2) Will check back following angiogram.   Roland Rack, MD [facility] [phone]  If 7pm- 7am, please page neurology on call as listed in [place].

## 2016-06-12 NOTE — Progress Notes (Signed)
Physical Therapy Session Note  Patient Details  Name: Perry Morrison MRN: [id number] Date of Birth: 09/06/1960  Today's Date: 06/12/2016 PT Individual Time: 1000-1100 PT Individual Time Calculation (min): 60 min   Short Term Goals: Week 2:  PT Short Term Goal 1 (Week 2): Pt will be able to manage w/c parts to set up for transfers with supervision cues PT Short Term Goal 2 (Week 2): Pt will complete squat pivot transfers with supervision.  PT Short Term Goal 3 (Week 2): Pt will perform bed mobility with supervision.  Skilled Therapeutic Interventions/Progress Updates: Tx 1: Pt received seated on toilet; denies pain and agreeable to treatment. [facility] and min[facility]. Required totalA for donning brief and pants. Seated in w/c, pt performs hygiene at sink modI. Discussed with pt transition of focus of therapy sessions to be primarily on w/c level mobility d/t lack of return in LEs and importance of reaching modI/S at w/c level to reduce burden of care on grandmother when returning home; pt verbalized understanding. W/c propulsion throughout session with S; pt integrating turning techniques around corners that he recently learned to reduce energy demand. Transfer w/c <>car with squat pivot and min guard for transfer; requires minA for getting LEs out of car d/t high floorboard height, balance impairments while managing LEs with BUEs. Transfer w/c >couch with minA squat pivot. Educated pt on head/hips and discussed importance of hip clearance for maintenance of skin integrity. Transfer to return to w/c performed with transfer board after demo by therapist of placement and technique of transfer. MinA for transfer with board uphill back to chair. For all transfers pt required increased time to manage arm rests, leg rests, position w/c for transfer. Pt remained seated in w/c at end of session, all needs in reach.   Tx 2: Pt missed 60 min PT due to being off unit for imaging. Continue per POC as  schedule allows.      Therapy Documentation Precautions:  Precautions Precautions: Fall Precaution Comments: RLE hemiparesis Restrictions Weight Bearing Restrictions: No General: PT Amount of Missed Time (min): 60 Minutes PT Missed Treatment Reason: CT/MRI Pain: Pain Assessment Pain Assessment: No/denies pain Pain Score: 0-No pain   See Function Navigator for Current Functional Status.   Therapy/Group: Individual Therapy  [name] 06/12/2016, 11:00 AM

## 2016-06-12 NOTE — Progress Notes (Signed)
Occupational Therapy Session Note  Patient Details  Name: Perry Morrison MRN: [id number] Date of Birth: 10/28/1960  Today's Date: 06/12/2016 OT Individual Time: 1150-1235 OT Individual Time Calculation (min): 45 min    Short Term Goals: Week 2:  OT Short Term Goal 1 (Week 2): Pt will complete one grooming task in standing to increase functional standing balance/ tolerance OT Short Term Goal 2 (Week 2): Pt will complete 3/3 toileting tasks with min A OT Short Term Goal 3 (Week 2): Pt will transfer to toilet with close supervision to decrease caregiver burden OT Short Term Goal 4 (Week 2): Will initiate shower transfer goal utilizing tub/shower combination with tub transfer bench in prep for d/c  Skilled Therapeutic Interventions/Progress Updates: Patient stated though he was fatigued, he preferred to work on stretching and exericsing his legs while seated with legs elevated in the recliner.   He stated he was hoping to be able to manage pulling his legs up one at time to wash and dress his lower legs and feet.  Initially he was able to doff left shoe with mod assist, while holidng balance with one hand onto the recliner arm rest.  also He sat stretching left hip and knee flexors while this clinician assisted him with right ankle and foot stretching.  Then patient neurologist came in to conference with him.       Then tech came in to take patient out for MRI.    This patient elected to use Stedy for recliner to bed transfer.    Patient required close supervision for recliner to stedy transfer and Max assistance for stedy to bed transfer (required assist to get both feet onto bed and move and then to reposition them to the middle of the bed ) and mod assist for positioning  Continue with primary therapist     Therapy Documentation Precautions:  Precautions Precautions: Fall Precaution Comments: RLE hemiparesis Restrictions Weight Bearing Restrictions: No General: General OT Amount of  Missed Time: 15 Minutes (15) Vital Signs:   Pain:denied    See Function Navigator for Current Functional Status.   Therapy/Group: Individual Therapy  [name], [name] [facility] 06/12/2016, 1:47 PM

## 2016-06-13 ENCOUNTER — Inpatient Hospital Stay (HOSPITAL_COMMUNITY): Payer: Medicaid Other | Admitting: Occupational Therapy

## 2016-06-13 MED ORDER — ENOXAPARIN SODIUM 30 MG/0.3ML ~~LOC~~ SOLN: 30.0000 mg | Freq: Two times a day (BID) | SUBCUTANEOUS | Status: DC

## 2016-06-13 NOTE — Progress Notes (Addendum)
Subjective: No changes  Exam: Vitals:   06/12/16 0526 06/13/16 0517  BP: 131/70 140/88  Pulse: 88 82  Resp: 18 18  Temp: 98.1 F (36.7 C) 98.6 F (37 C)   Gen: In bed, NAD Resp: non-labored breathing, no acute distress Abd: soft, nt  Neuro: MS: awake, alert, interactive and appropriate UG:QBVQ symmetric, eomi Motor: 5/5 thorughout bilateral upper extremities. He has good strength in his proximal LLE, but there is some weakness distally.   Sensory:intact to LT, but decreased to temp in the lower extremities.    Impression: 56 year old male with recently diagnosed thoracic cord edema with mild enhancement. This is been evaluated with SSA, SSB, ESR, CRP, Ace which are all negative. No pleocytosis which I think makes infectious myelitis much less likely. Inflammatory myelitis(e.g. Post-infectious or otherwise) can have a normal WBC count but most often does not, and I feel this is less likely. I think that this raises the suspicion for a vascular cause. With the worsening on repeat imaging I think arterial cord infarct is less likely as this is typically monophasic. A dural AVF or spinal AVM would explain his current picture. This was investigated with MRI/MRA but I agree with Dr. Patrcia Dolly that this can miss very small lesions and we will need to pursue spinal angiogram to rule this out.   Recommendations: 1) Spinal angiogram either Monday or Tuesday. I have already contacted Dr. Estanislado Pandy.  2) Agree with holding lovenox for now.  3)  Will check back after angiogram, please call with changes in the interim.   Perry Rack, MD [facility] [phone]  If 7pm- 7am, please page neurology on call as listed in [place].

## 2016-06-13 NOTE — Plan of Care (Signed)
Problem: RH BOWEL ELIMINATION Goal: RH STG MANAGE BOWEL WITH ASSISTANCE STG Manage Bowel with min Assistance.   Outcome: Not Progressing Suppository with BP max assist  Problem: RH BLADDER ELIMINATION Goal: RH STG MANAGE BLADDER WITH ASSISTANCE STG Manage Bladder With min  Assistance   Outcome: Not Progressing I/O cath q4-6 hrs

## 2016-06-13 NOTE — Progress Notes (Signed)
[facility]     PROGRESS NOTE    Subjective/Complaints: No spontaneous voiding Bowels responding to laxatives  ROS: pt denies nausea, vomiting, diarrhea, cough, shortness of breath or chest pain      Objective: Vital Signs: Blood pressure 140/88, pulse 82, temperature 98.6 F (37 C), temperature source Oral, resp. rate 18, height 6\' 1"  (1.854 m), weight 97.6 kg (215 lb 2.7 oz), SpO2 98 %. Mr Cervical Spine W Wo Contrast  Result Date: 06/12/2016 CLINICAL DATA:  Increasing lower extremity weakness. Upper and lower extremity spasms. Thoracic spinal cord abnormality on prior MRI. EXAM: MRI CERVICAL AND THORACIC SPINE WITHOUT AND WITH CONTRAST TECHNIQUE: Multiplanar and multiecho pulse sequences of the cervical spine, to include the craniocervical junction and cervicothoracic junction, and thoracic spine, were obtained without and with intravenous contrast. CONTRAST:  20mL MULTIHANCE GADOBENATE DIMEGLUMINE 529 MG/ML IV SOLN COMPARISON:  Thoracic spine MRI 05/26/2016 FINDINGS: MRI CERVICAL SPINE FINDINGS Alignment: Mild cervical spine straightening.  No listhesis. Vertebrae: No evidence of fracture, infection, or osseous lesion. Cord: Normal signal and morphology. No abnormal intradural enhancement. Posterior Fossa, vertebral arteries, paraspinal tissues: Unremarkable aside from partially visualized small bilateral maxillary sinus mucous retention cysts. Disc levels: C2-3: Minimal disc bulging and uncovertebral spurring without stenosis. C3-4: Mild disc bulging and uncovertebral spurring result in mild right and minimal left neural foraminal narrowing without spinal stenosis. C4-5: Mild disc bulging and uncovertebral spurring result in mild-to-moderate right and minimal to mild left neural foraminal stenosis without spinal stenosis. C5-6: Broad central to right foraminal disc osteophyte complex results in mild spinal stenosis with mild right ventral cord flattening and  severe right neural foraminal stenosis with with likely right C6 nerve root impingement. Mild left uncovertebral spurring results in mild left neural foraminal stenosis. C6-7: Disc bulging and uncovertebral spurring result in mild spinal stenosis and moderate to severe bilateral neural foraminal stenosis with potential bilateral C7 nerve root impingement. C7-T1: Moderate right facet arthrosis results in minimal right neural foraminal narrowing. No spinal stenosis. Mild right facet edema. MRI THORACIC SPINE FINDINGS Alignment:  Normal. Vertebrae: No evidence of fracture, infection, or suspicious osseous lesion. Cord: Extensive T2 hyperintensity throughout the thoracic spinal cord has slightly increased in longitudinal extent, now from lower T2 to T10-11 (previously lower T3 to T10). There are patchy areas of new abnormal intrinsic T1 hyperintensity and likely mild superimposed enhancement cord enhancement at T3-4 and at T10-11. Enhancement in the mid thoracic cord has decreased, with residual T1 signal abnormality at T7-8 reflecting intrinsic T1 signal more so than enhancement. There are new blood breakdown products extending from the T3 to T5 level as well as from T9-10 through T10-11. Blood breakdown products from T5-6 to T9 are similar to the prior study. Paraspinal and other soft tissues: Subcentimeter T2 hyperintense lesion partially visualized in the upper pole of the right kidney, likely a cyst. Disc levels: Minimal scattered disc bulging without sizable focal disc herniation or stenosis. IMPRESSION: 1. Edema type signal throughout the thoracic spinal cord, increased from 05/26/2016 and now extending from T2 to T10-11 with new areas of hemorrhage. Decreased enhancement in the mid thoracic cord. Hemorrhagic longitudinally extensive transverse myelitis is a leading consideration. 2. Normal cervical spinal cord. 3. Multilevel cervical disc degeneration as above. Mild spinal stenosis and severe neural foraminal  stenosis at C5-6 and C6-7. Electronically Signed   By: Sebastian Ache M.D.   On: 06/12/2016 17:11   Mr Thoracic Spine W Wo Contrast  Result Date: 06/12/2016  CLINICAL DATA:  Increasing lower extremity weakness. Upper and lower extremity spasms. Thoracic spinal cord abnormality on prior MRI. EXAM: MRI CERVICAL AND THORACIC SPINE WITHOUT AND WITH CONTRAST TECHNIQUE: Multiplanar and multiecho pulse sequences of the cervical spine, to include the craniocervical junction and cervicothoracic junction, and thoracic spine, were obtained without and with intravenous contrast. CONTRAST:  20mL MULTIHANCE GADOBENATE DIMEGLUMINE 529 MG/ML IV SOLN COMPARISON:  Thoracic spine MRI 05/26/2016 FINDINGS: MRI CERVICAL SPINE FINDINGS Alignment: Mild cervical spine straightening.  No listhesis. Vertebrae: No evidence of fracture, infection, or osseous lesion. Cord: Normal signal and morphology. No abnormal intradural enhancement. Posterior Fossa, vertebral arteries, paraspinal tissues: Unremarkable aside from partially visualized small bilateral maxillary sinus mucous retention cysts. Disc levels: C2-3: Minimal disc bulging and uncovertebral spurring without stenosis. C3-4: Mild disc bulging and uncovertebral spurring result in mild right and minimal left neural foraminal narrowing without spinal stenosis. C4-5: Mild disc bulging and uncovertebral spurring result in mild-to-moderate right and minimal to mild left neural foraminal stenosis without spinal stenosis. C5-6: Broad central to right foraminal disc osteophyte complex results in mild spinal stenosis with mild right ventral cord flattening and severe right neural foraminal stenosis with with likely right C6 nerve root impingement. Mild left uncovertebral spurring results in mild left neural foraminal stenosis. C6-7: Disc bulging and uncovertebral spurring result in mild spinal stenosis and moderate to severe bilateral neural foraminal stenosis with potential bilateral C7 nerve root  impingement. C7-T1: Moderate right facet arthrosis results in minimal right neural foraminal narrowing. No spinal stenosis. Mild right facet edema. MRI THORACIC SPINE FINDINGS Alignment:  Normal. Vertebrae: No evidence of fracture, infection, or suspicious osseous lesion. Cord: Extensive T2 hyperintensity throughout the thoracic spinal cord has slightly increased in longitudinal extent, now from lower T2 to T10-11 (previously lower T3 to T10). There are patchy areas of new abnormal intrinsic T1 hyperintensity and likely mild superimposed enhancement cord enhancement at T3-4 and at T10-11. Enhancement in the mid thoracic cord has decreased, with residual T1 signal abnormality at T7-8 reflecting intrinsic T1 signal more so than enhancement. There are new blood breakdown products extending from the T3 to T5 level as well as from T9-10 through T10-11. Blood breakdown products from T5-6 to T9 are similar to the prior study. Paraspinal and other soft tissues: Subcentimeter T2 hyperintense lesion partially visualized in the upper pole of the right kidney, likely a cyst. Disc levels: Minimal scattered disc bulging without sizable focal disc herniation or stenosis. IMPRESSION: 1. Edema type signal throughout the thoracic spinal cord, increased from 05/26/2016 and now extending from T2 to T10-11 with new areas of hemorrhage. Decreased enhancement in the mid thoracic cord. Hemorrhagic longitudinally extensive transverse myelitis is a leading consideration. 2. Normal cervical spinal cord. 3. Multilevel cervical disc degeneration as above. Mild spinal stenosis and severe neural foraminal stenosis at C5-6 and C6-7. Electronically Signed   By: Sebastian Ache M.D.   On: 06/12/2016 17:11    Recent Labs  06/12/16 1146  WBC 8.0  HGB 14.5  HCT 42.6  PLT 203    Recent Labs  06/12/16 1146  NA 132*  K 4.1  CL 95*  GLUCOSE 112*  BUN 27*  CREATININE 1.07  CALCIUM 9.6   CBG (last 3)  No results for input(s): GLUCAP in  the last 72 hours.  Wt Readings from Last 3 Encounters:  06/10/16 97.6 kg (215 lb 2.7 oz)  05/26/16 102.1 kg (225 lb)    Physical Exam:  HENT:  Head: Normocephalic and atraumatic.  Eyes: Conjunctivae and EOM are normal. Left eye exhibits no discharge.  Neck: Normal range of motion. Neck supple. No thyromegaly present.  Cardiovascular:RRR  Respiratory: RRR GI: Soft. Bowel sounds are normal. He exhibits no distension.  Genitourinary: no changes  Musculoskeletal: He exhibits no edema or tenderness.  Right thigh non-tender Neurological:  Neurological: He is alert and oriented to person, place, and time.  Pt alert and oriented.  B/l UE 5/5.  RLE: tr/5 HF, 0/5 distally--no changes  LLE: 2+/5 HF, 3- KE and 3-/5 ADF/PF, 2 minus, hip abduction ---unchanged Sensation intact to light touch in left lower limb, reduced around the right knee and right anterior thigh  DTRs 1+ LLE.  no resting tone Skin: Skin remains warm and dry.  Psych: pleasant and appropriate   Assessment/Plan: 1. Functional and mobility deficits secondary to thoracic myelopathy which require 3+ hours per day of interdisciplinary therapy in a comprehensive inpatient rehab setting. Physiatrist is providing close team supervision and 24 hour management of active medical problems listed below. Physiatrist and rehab team continue to assess barriers to discharge/monitor patient progress toward functional and medical goals.  Function:  Bathing Bathing position   Position: Shower  Bathing parts Body parts bathed by patient: Right arm, Right upper leg, Left arm, Left upper leg, Chest, Front perineal area, Right lower leg, Abdomen, Left lower leg, Back Body parts bathed by helper: Buttocks  Bathing assist Assist Level: Touching or steadying assistance(Pt > 75%)   Set up : To obtain items  Upper Body Dressing/Undressing Upper body dressing   What is the patient wearing?: Pull over shirt/dress     Pull over shirt/dress -  Perfomed by patient: Thread/unthread right sleeve, Thread/unthread left sleeve, Put head through opening, Pull shirt over trunk          Upper body assist Assist Level: No help, No cues   Set up : To obtain clothing/put away  Lower Body Dressing/Undressing Lower body dressing   What is the patient wearing?: Pants, Non-skid slipper socks Underwear - Performed by patient: Thread/unthread left underwear leg, Pull underwear up/down Underwear - Performed by helper: Thread/unthread right underwear leg, Thread/unthread left underwear leg, Pull underwear up/down Pants- Performed by patient: Thread/unthread left pants leg Pants- Performed by helper: Thread/unthread right pants leg, Pull pants up/down   Non-skid slipper socks- Performed by helper: Don/doff right sock, Don/doff left sock     Shoes - Performed by patient: Fasten right, Fasten left Shoes - Performed by helper: Don/doff right shoe, Don/doff left shoe, Fasten right, Fasten left AFO - Performed by patient: Don/doff right AFO     TED Hose - Performed by helper: Don/doff right TED hose, Don/doff left TED hose  Lower body assist Assist for lower body dressing: Touching or steadying assistance (Pt > 75%)      Toileting Toileting Toileting activity did not occur: No continent bowel/bladder event Toileting steps completed by patient: Performs perineal hygiene Toileting steps completed by helper: Adjust clothing prior to toileting, Adjust clothing after toileting Toileting Assistive Devices: Grab bar or rail  Toileting assist Assist level: Supervision or verbal cues   Transfers Chair/bed transfer Chair/bed transfer activity did not occur: N/A Chair/bed transfer method: Squat pivot Chair/bed transfer assist level: Touching or steadying assistance (Pt > 75%) Chair/bed transfer assistive device: Armrests     Locomotion Ambulation     Max distance: 5 Assist level: 2 helpers   Wheelchair   Type: Manual Max wheelchair distance:  >150 ft Assist Level: Supervision or verbal cues  Cognition Comprehension Comprehension assist level: Follows complex conversation/direction with no assist  Expression Expression assist level: Expresses complex ideas: With no assist  Social Interaction Social Interaction assist level: Interacts appropriately with others with medication or extra time (anti-anxiety, antidepressant).  Problem Solving Problem solving assist level: Solves complex problems: Recognizes & self-corrects  Memory Memory assist level: Complete Independence: No helper   Medical Problem List and Plan: 1.  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 Discussed case with neurology, Dr. 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 He may have some reduced sensation to light touch in L3 dermatome  -continue CIR therapies, PT, OT,             -prednisone completed    2.  DVT Prophylaxis/Anticoagulation: SCDs. Venous doppler study negative  -conitnue q12 lovenox, given some evidence of increased hemorrhage would hold Lovenox, discussed with neuro, will use SCDs, depending on angiogram results and IR opinion may resume after study 3. Pain Management: Hydrocodone as needed 4. Hypertension. Norvasc 10 mg daily Monitor when out of bed 5. Neuropsych: This patient is capable of making decisions on his own behalf.  -? Episodic anxiety   6. Skin/Wound Care: Routine skin checks 7. Fluids/Electrolytes/Nutrition:encourage PO  -recent labs stable  8.CRI stage III. Follow-up chemistries next week 9. Neurogenic bowel bladder.  -continue voiding trial. No voids yets.    - continue urecholine at 50mg  TID for now   -I/o cath prn    -having continent bowel movements   -linzess added with results-continue for now    -convert to afordable OTC option prior to dc   LOS (Days) 12 A FACE TO FACE  EVALUATION WAS PERFORMED  Erick Colace, MD 06/13/2016 10:26 AM

## 2016-06-13 NOTE — Progress Notes (Signed)
Occupational Therapy Session Note  Patient Details  Name: Perry GraffJamie Perno MRN: [id number] Date of Birth: 12/23/1960  Today's Date: 06/13/2016 OT Individual Time: 1315-1406 OT Individual Time Calculation (min): 51 min    Short Term Goals: Week 2:  OT Short Term Goal 1 (Week 2): Pt will complete one grooming task in standing to increase functional standing balance/ tolerance OT Short Term Goal 2 (Week 2): Pt will complete 3/3 toileting tasks with min A OT Short Term Goal 3 (Week 2): Pt will transfer to toilet with close supervision to decrease caregiver burden OT Short Term Goal 4 (Week 2): Will initiate shower transfer goal utilizing tub/shower combination with tub transfer bench in prep for d/c  Skilled Therapeutic Interventions/Progress Updates:    Upon arrival for 1:00 session pt eating lunch with friend and requesting therapist to return shortly. Therapist returned at 1:15 and pt ready for tx session. Pt seen for session focusing on functional transfers with use of sliding board. He transferred to EOB using hospital bed functions and min A for management of R LE off EOB.  Completed sliding board transfers EOB <> w/c initially with min A progressing to close supervision with VCs and assist for proper board placement and weight shift technique.  He then [facility] sliding [facility]/ [facility]. [facility] voiced urgent need for toileting task. [facility] management to be completed total A, lateral leans for buttock hygiene. He returned to w/c at end of session, left sitting up in w/c with all needs in reach and friend present.   Therapy Documentation Precautions:  Precautions Precautions: Fall Precaution Comments: RLE hemiparesis Restrictions Weight Bearing Restrictions: No Pain:   No/ denies pain  See Function Navigator for Current Functional Status.   Therapy/Group: Individual Therapy  [name],  Monicka Cyran C 06/13/2016, 6:40 AM

## 2016-06-14 ENCOUNTER — Inpatient Hospital Stay (HOSPITAL_COMMUNITY): Payer: Medicaid Other | Admitting: Physical Therapy

## 2016-06-14 NOTE — Progress Notes (Signed)
[facility] PHYSICAL MEDICINE & REHABILITATION     PROGRESS NOTE    Subjective/Complaints:   The patient had questions regarding his upcoming imaging study. We discussed that this would be to evaluate for a blood vessel abnormality in the Morrison. He also states his cousin had some similar symptoms of lower extremity weakness, which improved after treatment in [place]. He states that she told him that her problem was an inflammation of the spinal cord ROS: pt denies nausea, vomiting, diarrhea, cough, shortness of breath or chest pain      Objective: Vital Signs: Blood pressure 132/77, pulse 80, temperature 98.2 F (36.8 C), temperature source Oral, resp. rate 18, height 6\' 1"  (1.854 m), weight 97.6 kg (215 lb 2.7 oz), SpO2 99 %. Perry Morrison W Wo Contrast  Result Date: 06/12/2016 CLINICAL DATA:  Increasing lower extremity weakness. Upper and lower extremity spasms. Thoracic spinal cord abnormality on prior MRI. EXAM: MRI CERVICAL AND THORACIC Morrison WITHOUT AND WITH CONTRAST TECHNIQUE: Multiplanar and multiecho pulse sequences of the cervical Morrison, to include the craniocervical junction and cervicothoracic junction, and thoracic Morrison, were obtained without and with intravenous contrast. CONTRAST:  20mL MULTIHANCE GADOBENATE DIMEGLUMINE 529 MG/ML IV SOLN COMPARISON:  Thoracic Morrison MRI 05/26/2016 FINDINGS: MRI CERVICAL Morrison FINDINGS Alignment: Mild cervical Morrison straightening.  No listhesis. Vertebrae: No evidence of fracture, infection, or osseous lesion. Cord: Normal signal and morphology. No abnormal intradural enhancement. Posterior Fossa, vertebral arteries, paraspinal tissues: Unremarkable aside from partially visualized small bilateral maxillary sinus mucous retention cysts. Disc levels: C2-3: Minimal disc bulging and uncovertebral spurring without stenosis. C3-4: Mild disc bulging and uncovertebral spurring result in mild right and minimal left neural foraminal narrowing without  spinal stenosis. C4-5: Mild disc bulging and uncovertebral spurring result in mild-to-moderate right and minimal to mild left neural foraminal stenosis without spinal stenosis. C5-6: Broad central to right foraminal disc osteophyte complex results in mild spinal stenosis with mild right ventral cord flattening and severe right neural foraminal stenosis with with likely right C6 nerve root impingement. Mild left uncovertebral spurring results in mild left neural foraminal stenosis. C6-7: Disc bulging and uncovertebral spurring result in mild spinal stenosis and moderate to severe bilateral neural foraminal stenosis with potential bilateral C7 nerve root impingement. C7-T1: Moderate right facet arthrosis results in minimal right neural foraminal narrowing. No spinal stenosis. Mild right facet edema. MRI THORACIC Morrison FINDINGS Alignment:  Normal. Vertebrae: No evidence of fracture, infection, or suspicious osseous lesion. Cord: Extensive T2 hyperintensity throughout the thoracic spinal cord has slightly increased in longitudinal extent, now from lower T2 to T10-11 (previously lower T3 to T10). There are patchy areas of new abnormal intrinsic T1 hyperintensity and likely mild superimposed enhancement cord enhancement at T3-4 and at T10-11. Enhancement in the mid thoracic cord has decreased, with residual T1 signal abnormality at T7-8 reflecting intrinsic T1 signal more so than enhancement. There are new blood breakdown products extending from the T3 to T5 level as well as from T9-10 through T10-11. Blood breakdown products from T5-6 to T9 are similar to the prior study. Paraspinal and other soft tissues: Subcentimeter T2 hyperintense lesion partially visualized in the upper pole of the right kidney, likely a cyst. Disc levels: Minimal scattered disc bulging without sizable focal disc herniation or stenosis. IMPRESSION: 1. Edema type signal throughout the thoracic spinal cord, increased from 05/26/2016 and now extending  from T2 to T10-11 with new areas of hemorrhage. Decreased enhancement in the mid thoracic cord. Hemorrhagic longitudinally extensive transverse myelitis  is a leading consideration. 2. Normal cervical spinal cord. 3. Multilevel cervical disc degeneration as above. Mild spinal stenosis and severe neural foraminal stenosis at C5-6 and C6-7. Electronically Signed   By: Sebastian Ache M.D.   On: 06/12/2016 17:11   Per[facility] Thoracic Morrison W Wo Contrast  Result Date: 06/12/2016 CLINICAL DATA:  Increasing lower extremity weakness. Upper and lower extremity spasms. Thoracic spinal cord abnormality on prior MRI. EXAM: MRI CERVICAL AND THORACIC Morrison WITHOUT AND WITH CONTRAST TECHNIQUE: Multiplanar and multiecho pulse sequences of the cervical Morrison, to include the craniocervical junction and cervicothoracic junction, and thoracic Morrison, were obtained without and with intravenous contrast. CONTRAST:  20mL MULTIHANCE GADOBENATE DIMEGLUMINE 529 MG/ML IV SOLN COMPARISON:  Thoracic Morrison MRI 05/26/2016 FINDINGS: MRI CERVICAL Morrison FINDINGS Alignment: Mild cervical Morrison straightening.  No listhesis. Vertebrae: No evidence of fracture, infection, or osseous lesion. Cord: Normal signal and morphology. No abnormal intradural enhancement. Posterior Fossa, vertebral arteries, paraspinal tissues: Unremarkable aside from partially visualized small bilateral maxillary sinus mucous retention cysts. Disc levels: C2-3: Minimal disc bulging and uncovertebral spurring without stenosis. C3-4: Mild disc bulging and uncovertebral spurring result in mild right and minimal left neural foraminal narrowing without spinal stenosis. C4-5: Mild disc bulging and uncovertebral spurring result in mild-to-moderate right and minimal to mild left neural foraminal stenosis without spinal stenosis. C5-6: Broad central to right foraminal disc osteophyte complex results in mild spinal stenosis with mild right ventral cord flattening and severe right neural foraminal  stenosis with with likely right C6 nerve root impingement. Mild left uncovertebral spurring results in mild left neural foraminal stenosis. C6-7: Disc bulging and uncovertebral spurring result in mild spinal stenosis and moderate to severe bilateral neural foraminal stenosis with potential bilateral C7 nerve root impingement. C7-T1: Moderate right facet arthrosis results in minimal right neural foraminal narrowing. No spinal stenosis. Mild right facet edema. MRI THORACIC Morrison FINDINGS Alignment:  Normal. Vertebrae: No evidence of fracture, infection, or suspicious osseous lesion. Cord: Extensive T2 hyperintensity throughout the thoracic spinal cord has slightly increased in longitudinal extent, now from lower T2 to T10-11 (previously lower T3 to T10). There are patchy areas of new abnormal intrinsic T1 hyperintensity and likely mild superimposed enhancement cord enhancement at T3-4 and at T10-11. Enhancement in the mid thoracic cord has decreased, with residual T1 signal abnormality at T7-8 reflecting intrinsic T1 signal more so than enhancement. There are new blood breakdown products extending from the T3 to T5 level as well as from T9-10 through T10-11. Blood breakdown products from T5-6 to T9 are similar to the prior study. Paraspinal and other soft tissues: Subcentimeter T2 hyperintense lesion partially visualized in the upper pole of the right kidney, likely a cyst. Disc levels: Minimal scattered disc bulging without sizable focal disc herniation or stenosis. IMPRESSION: 1. Edema type signal throughout the thoracic spinal cord, increased from 05/26/2016 and now extending from T2 to T10-11 with new areas of hemorrhage. Decreased enhancement in the mid thoracic cord. Hemorrhagic longitudinally extensive transverse myelitis is a leading consideration. 2. Normal cervical spinal cord. 3. Multilevel cervical disc degeneration as above. Mild spinal stenosis and severe neural foraminal stenosis at C5-6 and C6-7.  Electronically Signed   By: Sebastian Ache M.D.   On: 06/12/2016 17:11    Recent Labs  06/12/16 1146  WBC 8.0  HGB 14.5  HCT 42.6  PLT 203    Recent Labs  06/12/16 1146  NA 132*  K 4.1  CL 95*  GLUCOSE 112*  BUN 27*  CREATININE 1.07  CALCIUM 9.6   CBG (last 3)  No results for input(s): GLUCAP in the last 72 hours.  Wt Readings from Last 3 Encounters:  06/10/16 97.6 kg (215 lb 2.7 oz)  05/26/16 102.1 kg (225 lb)    Physical Exam:  HENT:  Head: Normocephalic and atraumatic.  Eyes: Conjunctivae and EOM are normal. Left eye exhibits no discharge.  Neck: Normal range of motion. Neck supple. No thyromegaly present.  Cardiovascular:RRR  Respiratory: RRR GI: Soft. Bowel sounds are normal. He exhibits no distension.  Genitourinary: no changes  Musculoskeletal: He exhibits no edema or tenderness.  Right thigh non-tender Neurological:  Neurological: He is alert and oriented to person, place, and time.  Pt alert and oriented.  B/l UE 5/5.  RLE: tr/5 HF, 0/5 distally--no changes  LLE: 2+/5 HF, 3- KE and 3-/5 ADF/PF, 2 minus, hip abduction ---unchanged Sensation intact to light touch in left lower limb, reduced around the right knee and right anterior thigh  DTRs 1+ LLE.  no resting tone Skin: Skin remains warm and dry.  Psych: pleasant and appropriate   Assessment/Plan: 1. Functional and mobility deficits secondary to thoracic myelopathy which require 3+ hours per day of interdisciplinary therapy in a comprehensive inpatient rehab setting. Physiatrist is providing close team supervision and 24 hour management of active medical problems listed below. Physiatrist and rehab team continue to assess barriers to discharge/monitor patient progress toward functional and medical goals.  Function:  Bathing Bathing position   Position: Wheelchair/chair at sink  Bathing parts Body parts bathed by patient: Right arm, Left arm, Abdomen, Chest, Front perineal area, Buttocks, Right  upper leg, Left upper leg, Right lower leg, Left lower leg Body parts bathed by helper: Buttocks  Bathing assist Assist Level: Touching or steadying assistance(Pt > 75%)   Set up : To obtain items  Upper Body Dressing/Undressing Upper body dressing   What is the patient wearing?: Pull over shirt/dress     Pull over shirt/dress - Perfomed by patient: Thread/unthread right sleeve, Thread/unthread left sleeve, Put head through opening, Pull shirt over trunk          Upper body assist Assist Level: No help, No cues   Set up : To obtain clothing/put away  Lower Body Dressing/Undressing Lower body dressing   What is the patient wearing?: Pants, Non-skid slipper socks Underwear - Performed by patient: Thread/unthread left underwear leg, Pull underwear up/down Underwear - Performed by helper: Thread/unthread right underwear leg, Thread/unthread left underwear leg, Pull underwear up/down Pants- Performed by patient: Thread/unthread left pants leg Pants- Performed by helper: Thread/unthread right pants leg, Thread/unthread left pants leg, Pull pants up/down   Non-skid slipper socks- Performed by helper: Don/doff right sock, Don/doff left sock     Shoes - Performed by patient: Fasten right, Fasten left Shoes - Performed by helper: Don/doff right shoe, Don/doff left shoe, Fasten right, Fasten left AFO - Performed by patient: Don/doff right AFO     TED Hose - Performed by helper: Don/doff right TED hose, Don/doff left TED hose  Lower body assist Assist for lower body dressing: Touching or steadying assistance (Pt > 75%)      Toileting Toileting Toileting activity did not occur: No continent bowel/bladder event Toileting steps completed by patient: Performs perineal hygiene Toileting steps completed by helper: Adjust clothing prior to toileting, Adjust clothing after toileting Toileting Assistive Devices: Grab bar or rail  Toileting assist Assist level: Supervision or verbal cues    Transfers Chair/bed transfer Chair/bed transfer  activity did not occur: N/A Chair/bed transfer method: Squat pivot Chair/bed transfer assist level: Touching or steadying assistance (Pt > 75%) Chair/bed transfer assistive device: Armrests     Locomotion Ambulation     Max distance: 5 Assist level: 2 helpers   Wheelchair   Type: Manual Max wheelchair distance: >150 ft Assist Level: Supervision or verbal cues  Cognition Comprehension Comprehension assist level: Follows complex conversation/direction with no assist  Expression Expression assist level: Expresses complex ideas: With no assist  Social Interaction Social Interaction assist level: Interacts appropriately with others with medication or extra time (anti-anxiety, antidepressant).  Problem Solving Problem solving assist level: Solves complex problems: Recognizes & self-corrects  Memory Memory assist level: Complete Independence: No helper   Medical Problem List and Plan: 1.  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  DVT Prophylaxis/Anticoagulation: SCDs. Venous doppler study negative  -conitnue q12 lovenox, given some evidence of increased hemorrhage would hold Lovenox, discussed with neuro, will use SCDs, depending on angiogram results and IR opinion may resume after study 3. Pain Management: Hydrocodone as needed 4. Hypertension. Norvasc 10 mg daily Monitor when out of bed . Appears controlled at present Vitals:   06/14/16 0554 06/14/16 0956  BP: 120/86 132/77  Pulse: 80   Resp: 18   Temp: 98.2 F (36.8 C)    5. Neuropsych: This patient is capable of making decisions on his own behalf.  -? Episodic anxiety   6. Skin/Wound Care:  Routine skin checks 7. Fluids/Electrolytes/Nutrition:encourage PO  -recent labs stable  8.CRI stage III. Follow-up chemistries next week 9. Neurogenic bowel bladder.  -continue voiding trial. No voids yets.    - continue urecholine at 50mg  TID for now   -I/o cath prn    -having continent bowel movements   -linzess added with results-continue for now    -convert to afordable OTC option prior to dc   LOS (Days) 13 A FACE TO FACE EVALUATION WAS PERFORMED  Erick Colace, MD 06/14/2016 10:46 AM

## 2016-06-14 NOTE — Progress Notes (Signed)
Physical Therapy Session Note  Patient Details  Name: Perry Morrison MRN: [id number] Date of Birth: 27-Jul-1960  Today's Date: 06/14/2016 PT Individual Time: 1400-1500 PT Individual Time Calculation (min): 60 min   Short Term Goals: Week 1:  PT Short Term Goal 1 (Week 1): Pt will complete basic transfers with min assist PT Short Term Goal 1 - Progress (Week 1): Progressing toward goal (squat pivot with min assist, max assist for sit<>stand) PT Short Term Goal 2 (Week 1): Pt will be able to gait x 25' with LRAD with max of 1 (+2 for w/c follow/safety) PT Short Term Goal 2 - Progress (Week 1): Not met PT Short Term Goal 3 (Week 1): Pt will be able to negotiate 1 step with mod assist PT Short Term Goal 3 - Progress (Week 1): Not met PT Short Term Goal 4 (Week 1): Pt will be able to manage w/c parts to set up for transfers with supervision cues PT Short Term Goal 4 - Progress (Week 1): Progressing toward goal  Skilled Therapeutic Interventions/Progress Updates:  Pt was seen bedside in the pm. Pt willing to participate there ex. Pt wanted to focused on L LE strengthening. Pt performed 3 sets x 10 reps each, AAROM to RROM, heel slides, hip abd/add, hip IR/ER, SAQs, SLRs and ankle pumps. PROM R LE 10 reps each. Pt also educated on quads sets, ham sets and glut sets. Pt educated on proper technique with LE exercises.   Therapy Documentation Precautions:  Precautions Precautions: Fall Precaution Comments: RLE hemiparesis Restrictions Weight Bearing Restrictions: No General:   Pain: No c/o pain.   See Function Navigator for Current Functional Status.   Therapy/Group: Individual Therapy  Dub Amis 06/14/2016, 3:43 PM

## 2016-06-15 ENCOUNTER — Inpatient Hospital Stay (HOSPITAL_COMMUNITY): Payer: Self-pay | Admitting: Physical Therapy

## 2016-06-15 ENCOUNTER — Inpatient Hospital Stay (HOSPITAL_COMMUNITY): Payer: Self-pay | Admitting: Occupational Therapy

## 2016-06-15 ENCOUNTER — Encounter (HOSPITAL_COMMUNITY): Payer: Self-pay | Admitting: Psychology

## 2016-06-15 ENCOUNTER — Inpatient Hospital Stay (HOSPITAL_COMMUNITY): Payer: Medicaid Other | Admitting: Occupational Therapy

## 2016-06-15 MED ORDER — SODIUM CHLORIDE 0.9 % IV SOLN
INTRAVENOUS | Status: DC
  Administered 2016-06-15 – 2016-06-16 (×3): via INTRAVENOUS

## 2016-06-15 MED ORDER — BETHANECHOL CHLORIDE 25 MG PO TABS
25.0000 mg | ORAL_TABLET | Freq: Three times a day (TID) | ORAL | Status: DC
  Administered 2016-06-15 – 2016-06-19 (×11): 25 mg via ORAL
  Filled 2016-06-15 (×11): qty 1

## 2016-06-15 MED ORDER — VALACYCLOVIR HCL 500 MG PO TABS
500.0000 mg | ORAL_TABLET | Freq: Two times a day (BID) | ORAL | Status: DC
  Administered 2016-06-15 – 2016-07-10 (×50): 500 mg via ORAL
  Filled 2016-06-15 (×50): qty 1

## 2016-06-15 MED ORDER — NIMODIPINE 30 MG PO CAPS: 0.0000 mg | ORAL_CAPSULE | ORAL | Status: DC

## 2016-06-15 MED ORDER — CLOPIDOGREL BISULFATE 75 MG PO TABS: 75.0000 mg | ORAL_TABLET | ORAL | Status: DC

## 2016-06-15 MED ORDER — CEFAZOLIN SODIUM-DEXTROSE 2-4 GM/100ML-% IV SOLN
2.0000 g | INTRAVENOUS | Status: AC
  Administered 2016-06-16: 2 g via INTRAVENOUS
  Filled 2016-06-15: qty 100

## 2016-06-15 MED ORDER — ASPIRIN EC 325 MG PO TBEC: 325.0000 mg | DELAYED_RELEASE_TABLET | ORAL | Status: DC

## 2016-06-15 NOTE — Consult Note (Signed)
Chief Complaint: Patient was seen in consultation today for spinal arteriogram with possible arteriovenous malformation embolization at the request of Dr Willeen Niece  Referring Physician(s): Dr Willeen Niece  Supervising Physician: Julieanne Cotton  Patient Status: [facility]  History of Present Illness: Perry Morrison is a 56 y.o. male   Normal state of health 3 weeks ago Started to develop pain in back and abd Was seen in ED and diagnosed with constipation Given Rx for "muscle relaxant" Within 24- 48 hrs had developed unsteady gait progressed to unable to use right leg at all. Did not experience Rt leg pain Denies numbness or tingling Was seen in ED 06/13/16 Dx: thoracic cord edema with mild enhancement. All studies benign ? Inflammatory myelitis Vascular cause? Concerning for Arteriovenous malformation vs dural arteriovenous fistula Request for spinal arteriogram with possible embolization  2/2 MR: IMPRESSION: 1. Edema type signal throughout the thoracic spinal cord, increased from 05/26/2016 and now extending from T2 to T10-11 with new areas of hemorrhage. Decreased enhancement in the mid thoracic cord. Hemorrhagic longitudinally extensive transverse myelitis is a leading consideration. 2. Normal cervical spinal cord. 3. Multilevel cervical disc degeneration as above. Mild spinal stenosis and severe neural foraminal stenosis at C5-6 and C6-7  Dr Corliss Skains has reviewed imaging and approves procedure  Past Medical History:  Diagnosis Date  . AKI (acute kidney injury) (HCC)   . 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 Hypertension   . Myelitis (HCC)   . Right leg weakness   . Urinary retention     Past Surgical History:  Procedure Laterality Date  . FRACTURE SURGERY Right 1986    Allergies: No known allergies  Medications: Prior to Admission medications   Medication Sig Start Date End Date Taking? Authorizing Provider    amLODipine (NORVASC) 10 MG tablet Take 1 tablet (10 mg total) by mouth daily. 06/02/16  Yes Ripudeep Jenna Luo, MD  hydrocortisone (ANUSOL-HC) 25 MG suppository Place 1 suppository (25 mg total) rectally daily. 06/02/16  Yes Ripudeep Jenna Luo, MD  MAGNESIUM PO Take 1 tablet by mouth daily.   Yes Historical Provider, MD  Omega-3 Fatty Acids (OMEGA-3 PO) Take 1 capsule by mouth daily.   Yes Historical Provider, MD  pantoprazole (PROTONIX) 40 MG tablet Take 1 tablet (40 mg total) by mouth daily. 06/02/16  Yes Ripudeep Jenna Luo, MD  polyethylene glycol (MIRALAX / GLYCOLAX) packet Take 34 g by mouth 2 (two) times daily. 06/01/16  Yes Ripudeep Jenna Luo, MD  senna-docusate (SENOKOT-S) 8.6-50 MG tablet Take 1 tablet by mouth 2 (two) times daily. 06/01/16  Yes Ripudeep Jenna Luo, MD     Family History  Problem Relation Age of Onset  . Hypertension Mother   . AAA (abdominal aortic aneurysm) Mother     Social History   Social History  . Marital status: Married    Spouse name: N/A  . Number of children: N/A  . Years of education: N/A   Social History Main Topics  . Smoking status: Never Smoker  . Smokeless tobacco: Never Used  . Alcohol use No  . Drug use: No  . Sexual activity: Not on file   Other Topics Concern  . Not on file   Social History Narrative  . No narrative on file    Review of Systems: A 12 point ROS discussed and pertinent positives are indicated in the HPI above.  All other systems are negative.  Review of Systems  Constitutional: Negative for activity change, appetite change and unexpected weight change.  Respiratory: Negative for choking, chest tightness and shortness of breath.   Cardiovascular: Negative for chest pain.  Musculoskeletal: Negative for back pain, gait problem, myalgias and neck pain.  Neurological: Positive for tremors, weakness and numbness. Negative for headaches.    Vital Signs: BP 119/85 (BP Location: Right Arm)   Pulse 94   Temp 97.9 F (36.6 C) (Oral)    Resp (!) 94   Ht 6\' 1"  (1.854 m)   Wt 215 lb 2.7 oz (97.6 kg)   SpO2 97%   BMI 28.39 kg/m   Physical Exam  Constitutional: He appears well-developed and well-nourished. No distress.  HENT:  Head: Normocephalic and atraumatic.  Eyes: EOM are normal.  Cardiovascular: Normal rate, regular rhythm, normal heart sounds and intact distal pulses.  Exam reveals no gallop and no friction rub.   No murmur heard. Pulmonary/Chest: Effort normal and breath sounds normal. No respiratory distress. He has no wheezes. He has no rales.  Abdominal: Soft. Bowel sounds are normal.  Musculoskeletal: He exhibits tenderness.  Neurological: A sensory deficit is present. He exhibits abnormal muscle tone. Coordination normal.  RLE displays muscle weakness, decreased sensation, and lack of muscle tone. Muscle spasms in the LLE and negative proprioception bilaterally    Mallampati Score:  MD Evaluation Airway: WNL Heart: WNL Abdomen: WNL Chest/ Lungs: WNL ASA  Classification: 2 Mallampati/Airway Score: Two  Imaging: Ct Head Wo Contrast  Result Date: 05/26/2016 CLINICAL DATA:  Right leg numbness with decreased movement of right foot. EXAM: CT HEAD WITHOUT CONTRAST TECHNIQUE: Contiguous axial images were obtained from the base of the skull through the vertex without intravenous contrast. COMPARISON:  None. FINDINGS: Brain: No evidence of acute infarction, hemorrhage, hydrocephalus, extra-axial collection or mass lesion/mass effect. Vascular: No hyperdense vessel or unexpected calcification. Skull: Normal. Negative for fracture or focal lesion. Sinuses/Orbits: No acute finding. Other: None. IMPRESSION: Normal head CT. Electronically Signed   By: Irish Lack M.D.   On: 05/26/2016 15:21   Mr Laqueta Jean ZO Contrast  Result Date: 05/27/2016 CLINICAL DATA:  Evaluate for intracranial white matter abnormalities in a patient with paraplegia. EXAM: MRI HEAD WITHOUT AND WITH CONTRAST TECHNIQUE: Multiplanar, multiecho  pulse sequences of the brain and surrounding structures were obtained without and with intravenous contrast. CONTRAST:  20mL MULTIHANCE GADOBENATE DIMEGLUMINE 529 MG/ML IV SOLN COMPARISON:  MRI thoracic spine 05/26/2016. FINDINGS: Brain: No evidence for acute infarction, hemorrhage, mass lesion, hydrocephalus, or extra-axial fluid. Normal for age cerebral volume. No significant white matter signal abnormality to suggest demyelinating disease, vasculitis, or inflammatory/infectious process. Post infusion, no abnormal enhancement of the brain or meninges. Vascular: Flow voids are maintained throughout the carotid, basilar, and vertebral arteries. There are no areas of chronic hemorrhage. Skull and upper cervical spine: Unremarkable visualized calvarium, skullbase, and cervical vertebrae. Pituitary, pineal, cerebellar tonsils unremarkable. No upper cervical cord lesions. Sinuses/Orbits: No orbital masses or proptosis. Globes appear symmetric. Sinuses appear well aerated, without evidence for air-fluid level. Within limits for assessment on routine brain MR, normal-appearing optic nerves, optic chiasm, and tracts. Other: No nasopharyngeal pathology or mastoid fluid. Scalp and other visualized extracranial soft tissues grossly unremarkable. IMPRESSION: No significant white matter signal abnormalities to suggest demyelinating disease, vasculitis, or infectious/inflammatory process. No abnormal postcontrast enhancement to suggest a granulomatous process such as neurosarcoid. Within limits for assessment on routine brain MR, negative orbits; no evidence for demyelinating process of the anterior optic pathways. Electronically Signed  By: Elsie StainJohn T Curnes M.D.   On: 05/27/2016 17:17   Mr Cervical Spine W Wo Contrast  Result Date: 06/12/2016 CLINICAL DATA:  Increasing lower extremity weakness. Upper and lower extremity spasms. Thoracic spinal cord abnormality on prior MRI. EXAM: MRI CERVICAL AND THORACIC SPINE WITHOUT AND  WITH CONTRAST TECHNIQUE: Multiplanar and multiecho pulse sequences of the cervical spine, to include the craniocervical junction and cervicothoracic junction, and thoracic spine, were obtained without and with intravenous contrast. CONTRAST:  20mL MULTIHANCE GADOBENATE DIMEGLUMINE 529 MG/ML IV SOLN COMPARISON:  Thoracic spine MRI 05/26/2016 FINDINGS: MRI CERVICAL SPINE FINDINGS Alignment: Mild cervical spine straightening.  No listhesis. Vertebrae: No evidence of fracture, infection, or osseous lesion. Cord: Normal signal and morphology. No abnormal intradural enhancement. Posterior Fossa, vertebral arteries, paraspinal tissues: Unremarkable aside from partially visualized small bilateral maxillary sinus mucous retention cysts. Disc levels: C2-3: Minimal disc bulging and uncovertebral spurring without stenosis. C3-4: Mild disc bulging and uncovertebral spurring result in mild right and minimal left neural foraminal narrowing without spinal stenosis. C4-5: Mild disc bulging and uncovertebral spurring result in mild-to-moderate right and minimal to mild left neural foraminal stenosis without spinal stenosis. C5-6: Broad central to right foraminal disc osteophyte complex results in mild spinal stenosis with mild right ventral cord flattening and severe right neural foraminal stenosis with with likely right C6 nerve root impingement. Mild left uncovertebral spurring results in mild left neural foraminal stenosis. C6-7: Disc bulging and uncovertebral spurring result in mild spinal stenosis and moderate to severe bilateral neural foraminal stenosis with potential bilateral C7 nerve root impingement. C7-T1: Moderate right facet arthrosis results in minimal right neural foraminal narrowing. No spinal stenosis. Mild right facet edema. MRI THORACIC SPINE FINDINGS Alignment:  Normal. Vertebrae: No evidence of fracture, infection, or suspicious osseous lesion. Cord: Extensive T2 hyperintensity throughout the thoracic spinal  cord has slightly increased in longitudinal extent, now from lower T2 to T10-11 (previously lower T3 to T10). There are patchy areas of new abnormal intrinsic T1 hyperintensity and likely mild superimposed enhancement cord enhancement at T3-4 and at T10-11. Enhancement in the mid thoracic cord has decreased, with residual T1 signal abnormality at T7-8 reflecting intrinsic T1 signal more so than enhancement. There are new blood breakdown products extending from the T3 to T5 level as well as from T9-10 through T10-11. Blood breakdown products from T5-6 to T9 are similar to the prior study. Paraspinal and other soft tissues: Subcentimeter T2 hyperintense lesion partially visualized in the upper pole of the right kidney, likely a cyst. Disc levels: Minimal scattered disc bulging without sizable focal disc herniation or stenosis. IMPRESSION: 1. Edema type signal throughout the thoracic spinal cord, increased from 05/26/2016 and now extending from T2 to T10-11 with new areas of hemorrhage. Decreased enhancement in the mid thoracic cord. Hemorrhagic longitudinally extensive transverse myelitis is a leading consideration. 2. Normal cervical spinal cord. 3. Multilevel cervical disc degeneration as above. Mild spinal stenosis and severe neural foraminal stenosis at C5-6 and C6-7. Electronically Signed   By: Sebastian AcheAllen  Grady M.D.   On: 06/12/2016 17:11   Mr Thoracic Spine W Wo Contrast  Result Date: 06/12/2016 CLINICAL DATA:  Increasing lower extremity weakness. Upper and lower extremity spasms. Thoracic spinal cord abnormality on prior MRI. EXAM: MRI CERVICAL AND THORACIC SPINE WITHOUT AND WITH CONTRAST TECHNIQUE: Multiplanar and multiecho pulse sequences of the cervical spine, to include the craniocervical junction and cervicothoracic junction, and thoracic spine, were obtained without and with intravenous contrast. CONTRAST:  20mL MULTIHANCE GADOBENATE DIMEGLUMINE 529  MG/ML IV SOLN COMPARISON:  Thoracic spine MRI 05/26/2016  FINDINGS: MRI CERVICAL SPINE FINDINGS Alignment: Mild cervical spine straightening.  No listhesis. Vertebrae: No evidence of fracture, infection, or osseous lesion. Cord: Normal signal and morphology. No abnormal intradural enhancement. Posterior Fossa, vertebral arteries, paraspinal tissues: Unremarkable aside from partially visualized small bilateral maxillary sinus mucous retention cysts. Disc levels: C2-3: Minimal disc bulging and uncovertebral spurring without stenosis. C3-4: Mild disc bulging and uncovertebral spurring result in mild right and minimal left neural foraminal narrowing without spinal stenosis. C4-5: Mild disc bulging and uncovertebral spurring result in mild-to-moderate right and minimal to mild left neural foraminal stenosis without spinal stenosis. C5-6: Broad central to right foraminal disc osteophyte complex results in mild spinal stenosis with mild right ventral cord flattening and severe right neural foraminal stenosis with with likely right C6 nerve root impingement. Mild left uncovertebral spurring results in mild left neural foraminal stenosis. C6-7: Disc bulging and uncovertebral spurring result in mild spinal stenosis and moderate to severe bilateral neural foraminal stenosis with potential bilateral C7 nerve root impingement. C7-T1: Moderate right facet arthrosis results in minimal right neural foraminal narrowing. No spinal stenosis. Mild right facet edema. MRI THORACIC SPINE FINDINGS Alignment:  Normal. Vertebrae: No evidence of fracture, infection, or suspicious osseous lesion. Cord: Extensive T2 hyperintensity throughout the thoracic spinal cord has slightly increased in longitudinal extent, now from lower T2 to T10-11 (previously lower T3 to T10). There are patchy areas of new abnormal intrinsic T1 hyperintensity and likely mild superimposed enhancement cord enhancement at T3-4 and at T10-11. Enhancement in the mid thoracic cord has decreased, with residual T1 signal abnormality  at T7-8 reflecting intrinsic T1 signal more so than enhancement. There are new blood breakdown products extending from the T3 to T5 level as well as from T9-10 through T10-11. Blood breakdown products from T5-6 to T9 are similar to the prior study. Paraspinal and other soft tissues: Subcentimeter T2 hyperintense lesion partially visualized in the upper pole of the right kidney, likely a cyst. Disc levels: Minimal scattered disc bulging without sizable focal disc herniation or stenosis. IMPRESSION: 1. Edema type signal throughout the thoracic spinal cord, increased from 05/26/2016 and now extending from T2 to T10-11 with new areas of hemorrhage. Decreased enhancement in the mid thoracic cord. Hemorrhagic longitudinally extensive transverse myelitis is a leading consideration. 2. Normal cervical spinal cord. 3. Multilevel cervical disc degeneration as above. Mild spinal stenosis and severe neural foraminal stenosis at C5-6 and C6-7. Electronically Signed   By: Sebastian Ache M.D.   On: 06/12/2016 17:11   Mr Thoracic Spine W Wo Contrast  Result Date: 05/26/2016 EXAM: MRI THORACIC AND LUMBAR SPINE WITHOUT AND WITH CONTRAST TECHNIQUE: Multiplanar and multiecho pulse sequences of the thoracic and lumbar spine were obtained without and with intravenous contrast. CONTRAST:  20mL MULTIHANCE GADOBENATE DIMEGLUMINE 529 MG/ML IV SOLN COMPARISON:  None. FINDINGS: MRI THORACIC SPINE FINDINGS Alignment:  Minimal curvature. Vertebrae: Mild edema within the right C7 facet possibly degenerative in origin. Cord: Abnormal signal within the cord extends from the lower T3 through the mid T10 level. Additionally, blood breakdown products are noted within the right aspect of the cord extending from the T5-6 level to the lower T9 level (maximal at the T7-8 level). Mild enhancement of the cord T6-7 through upper T8 level. Paraspinal and other soft tissues: Mild dilation thoracic aorta with ascending thoracic aorta measuring up to 4.2 cm.  Disc levels: C6-7: Moderate bulge/ osteophyte with mild spinal stenosis and minimal cord flattening.  Axial images not obtained. Scattered minimal bulges throughout the thoracic region without large thoracic disc herniation causing cord compression. MRI LUMBAR SPINE FINDINGS Segmentation:  Last fully open disc space labeled L5-S1. Alignment:  Mild straightening and slight curvature. Vertebrae:  Endplate degenerative changes L4-5. Conus medullaris: Extends to the T12-L1 level. Paraspinal and other soft tissues: Tiny right renal cyst. Disc levels: L1-2:  Negative. L2-3: Mild facet degenerative changes. Minimal bulge greater left lateral position touching but not compressing exiting left L2 nerve root. L3-4: Bulge. Facet degenerative changes. Mild bilateral foraminal narrowing. Multifactorial mild spinal stenosis and lateral recess narrowing bilaterally. L4-5: Facet degenerative changes and ligamentum flavum hypertrophy greater on the right. Disc degeneration with disc space narrowing endplate reactive changes greater on the right. Bulge and osteophyte greater right foraminal/ lateral position encroaching upon exiting right L4 nerve root. Moderate narrowing right lateral thecal sac and right lateral recess. Mild spinal stenosis greater on right. L5-S1: Moderate facet degenerative changes. Bulge with osteophyte. Mild lateral recess narrowing. Mild foraminal narrowing greater on the left. IMPRESSION: MRI THORACIC SPINE Abnormal signal within the thoracic cord extends from the lower T3 through the mid T10 level suggestive of edema. Additionally, blood breakdown products are noted within the right aspect of the cord extending from the T5-6 level to the lower T9 level (maximal at the T7-8 level). Mild enhancement of the cord T6-7 through upper T8 level. Etiology of thoracic cord abnormality is indeterminate. Considerations include: Hemorrhagic transverse myelitis secondary to viral trigger (infection or vaccination). Patient  reports possible right-sided shingles in the past and therefore this may be related to such. Result of myelitis secondary to recent infection (such as influenza) with hemorrhagic conversion or ADEM with hemorrhagic conversion are considerations. Thoracic cord ischemia with hemorrhagic conversion. Given the fact the patient has a slightly dilated ascending thoracic aorta, CT angiogram of the chest abdomen and pelvis may be considered to exclude dissection not detected on the current exam. Vascular malformation such as a cavernoma with bleeding and surrounding edema. Although there is blush like enhancement, appearance is not typical for dural fistula as vessels are not seen along the periphery of the cord. Additionally, symptoms occurred more acutely than expected with dural fistula. Post inflammatory myelitis/vasculitis with bleeding (such as Behcet's disease, sarcoidosis, Wegener's, etc.). Tumor felt last likely consideration (ependymomas can bleed but usually enhance more). Metastatic disease felt less likely unless the patient has a known malignancy with hemorrhagic propensity such as melanoma. Neuromyelitis optica. Multiple sclerosis with hemorrhagic conversion felt less likely consideration. C6-7 moderate bulge/ osteophyte with mild spinal stenosis and minimal cord flattening. Axial images not obtained. Scattered minimal bulges throughout the thoracic region without large thoracic disc herniation causing cord compression. Mild edema within the right C7 facet possibly degenerative in origin. MRI LUMBAR SPINE Multi-level degenerative changes most prominent L4-5 level as detailed above. These results were called by telephone at the time of interpretation on 05/26/2016 at 12:44 pm to Dr. Karma Ganja, who verbally acknowledged these results. Electronically Signed   By: Lacy Duverney M.D.   On: 05/26/2016 13:15   Mr Lumbar Spine W Wo Contrast  Result Date: 05/26/2016 EXAM: MRI THORACIC AND LUMBAR SPINE WITHOUT AND WITH  CONTRAST TECHNIQUE: Multiplanar and multiecho pulse sequences of the thoracic and lumbar spine were obtained without and with intravenous contrast. CONTRAST:  20mL MULTIHANCE GADOBENATE DIMEGLUMINE 529 MG/ML IV SOLN COMPARISON:  None. FINDINGS: MRI THORACIC SPINE FINDINGS Alignment:  Minimal curvature. Vertebrae: Mild edema within the right C7 facet possibly degenerative in  origin. Cord: Abnormal signal within the cord extends from the lower T3 through the mid T10 level. Additionally, blood breakdown products are noted within the right aspect of the cord extending from the T5-6 level to the lower T9 level (maximal at the T7-8 level). Mild enhancement of the cord T6-7 through upper T8 level. Paraspinal and other soft tissues: Mild dilation thoracic aorta with ascending thoracic aorta measuring up to 4.2 cm. Disc levels: C6-7: Moderate bulge/ osteophyte with mild spinal stenosis and minimal cord flattening. Axial images not obtained. Scattered minimal bulges throughout the thoracic region without large thoracic disc herniation causing cord compression. MRI LUMBAR SPINE FINDINGS Segmentation:  Last fully open disc space labeled L5-S1. Alignment:  Mild straightening and slight curvature. Vertebrae:  Endplate degenerative changes L4-5. Conus medullaris: Extends to the T12-L1 level. Paraspinal and other soft tissues: Tiny right renal cyst. Disc levels: L1-2:  Negative. L2-3: Mild facet degenerative changes. Minimal bulge greater left lateral position touching but not compressing exiting left L2 nerve root. L3-4: Bulge. Facet degenerative changes. Mild bilateral foraminal narrowing. Multifactorial mild spinal stenosis and lateral recess narrowing bilaterally. L4-5: Facet degenerative changes and ligamentum flavum hypertrophy greater on the right. Disc degeneration with disc space narrowing endplate reactive changes greater on the right. Bulge and osteophyte greater right foraminal/ lateral position encroaching upon exiting  right L4 nerve root. Moderate narrowing right lateral thecal sac and right lateral recess. Mild spinal stenosis greater on right. L5-S1: Moderate facet degenerative changes. Bulge with osteophyte. Mild lateral recess narrowing. Mild foraminal narrowing greater on the left. IMPRESSION: MRI THORACIC SPINE Abnormal signal within the thoracic cord extends from the lower T3 through the mid T10 level suggestive of edema. Additionally, blood breakdown products are noted within the right aspect of the cord extending from the T5-6 level to the lower T9 level (maximal at the T7-8 level). Mild enhancement of the cord T6-7 through upper T8 level. Etiology of thoracic cord abnormality is indeterminate. Considerations include: Hemorrhagic transverse myelitis secondary to viral trigger (infection or vaccination). Patient reports possible right-sided shingles in the past and therefore this may be related to such. Result of myelitis secondary to recent infection (such as influenza) with hemorrhagic conversion or ADEM with hemorrhagic conversion are considerations. Thoracic cord ischemia with hemorrhagic conversion. Given the fact the patient has a slightly dilated ascending thoracic aorta, CT angiogram of the chest abdomen and pelvis may be considered to exclude dissection not detected on the current exam. Vascular malformation such as a cavernoma with bleeding and surrounding edema. Although there is blush like enhancement, appearance is not typical for dural fistula as vessels are not seen along the periphery of the cord. Additionally, symptoms occurred more acutely than expected with dural fistula. Post inflammatory myelitis/vasculitis with bleeding (such as Behcet's disease, sarcoidosis, Wegener's, etc.). Tumor felt last likely consideration (ependymomas can bleed but usually enhance more). Metastatic disease felt less likely unless the patient has a known malignancy with hemorrhagic propensity such as melanoma. Neuromyelitis  optica. Multiple sclerosis with hemorrhagic conversion felt less likely consideration. C6-7 moderate bulge/ osteophyte with mild spinal stenosis and minimal cord flattening. Axial images not obtained. Scattered minimal bulges throughout the thoracic region without large thoracic disc herniation causing cord compression. Mild edema within the right C7 facet possibly degenerative in origin. MRI LUMBAR SPINE Multi-level degenerative changes most prominent L4-5 level as detailed above. These results were called by telephone at the time of interpretation on 05/26/2016 at 12:44 pm to Dr. Karma Ganja, who verbally acknowledged these results. Electronically  Signed   By: Lacy Duverney M.D.   On: 05/26/2016 13:15   US Renal  Result Date: 05/27/2016 CLINICAL DATA:  Acute renal failure EXAM: RENAL / URINARY TRACT ULTRASOUND COMPLETE COMPARISON:  None. FINDINGS: Right Kidney: Length: 10.2 cm. Echogenicity within normal limits. No mass or hydronephrosis visualized. Left Kidney: Length: 11.2 cm. Echogenicity within normal limits. No mass or hydronephrosis visualized. Bladder: Decompressed by Foley catheter. IMPRESSION: No evidence for hydronephrosis. Electronically Signed   By: Kennith Center M.D.   On: 05/27/2016 17:51   Mr [facility] W Wo Contrast  Result Date: 05/27/2016 CLINICAL DATA:  Thoracic Spinal cord hemorrhagic lesion with edema. Paraplegia. EXAM: MRA SPINE WITHOUT AND WITH CONTRAST TECHNIQUE: Multiplanar and multiecho pulse sequences of the spine were obtained without and with intravenous contrast. Angiographic images of the spine were obtained using MRA technique without and with intravenous contrast. CONTRAST:  See below 20mL MULTIHANCE GADOBENATE DIMEGLUMINE 529 MG/ML IV SOLN COMPARISON:  MRI brain reported separately FINDINGS: There is good opacification of the spinal arteries and veins. No spinal AVM or fistula is identified. The cord remains enlarged, T2 hyperintense, with heterogeneity similar to  05/26/2016. See differential considerations as outlined on that study. IMPRESSION: No spinal AVM or fistula is identified. Electronically Signed   By: Elsie Stain M.D.   On: 05/27/2016 16:56   Dg Abdomen Acute W/chest  Result Date: 05/24/2016 CLINICAL DATA:  Abdominal pain EXAM: DG ABDOMEN ACUTE W/ 1V CHEST COMPARISON:  None. FINDINGS: The lungs are clear.  Cardiomediastinal contours are normal. No free intraperitoneal air. There is a large amount of stool within the colon. No dilated small bowel is identified. IMPRESSION: 1. Clear lungs. 2. No free intraperitoneal air. 3. Large amount of stool within the colon. No radiographic evidence of small-bowel obstruction. Electronically Signed   By: Deatra Robinson M.D.   On: 05/24/2016 01:57   Dg Abd Portable 1v  Result Date: 06/02/2016 CLINICAL DATA:  56 year old male with distended abdomen. EXAM: PORTABLE ABDOMEN - 1 VIEW COMPARISON:  Radiograph dated 05/30/2016 FINDINGS: There is persistent gaseous distention of the colon with interval increase in the colonic diameter is since the prior radiograph. Moderate stool noted in the right colon. No dilated small bowel. No definite free air or radiopaque calculi. The soft tissues are grossly unremarkable. IMPRESSION: Persistent pancolonic gaseous distention with interval increase in the colonic diameter since the prior radiograph. No dilated small bowel loops identified. Electronically Signed   By: Elgie Collard M.D.   On: 06/02/2016 23:12   Dg Abd Portable 1v  Result Date: 05/30/2016 CLINICAL DATA:  Abdominal distension.  Constipation EXAM: PORTABLE ABDOMEN - 1 VIEW COMPARISON:  None. FINDINGS: There is moderate volume stool in the ascending colon. Gas throughout the LEFT colon and rectum. Gas in the transverse colon. IMPRESSION: Moderate volume stool in the RIGHT colon could indicate constipation. No evidence of high-grade obstruction. Gaseous distention throughout the colon. Electronically Signed   By: Genevive Bi M.D.   On: 05/30/2016 17:12   Ct Angio Chest/abd/pel For Dissection W And/or Wo Contrast  Result Date: 05/26/2016 CLINICAL DATA:  Low back pain and abdominal pain. Evidence for thoracic cord ischemia with hemorrhagic conversion on recent MRI. CT angiogram was recommended to exclude an aortic dissection. EXAM: CT ANGIOGRAPHY CHEST, ABDOMEN AND PELVIS TECHNIQUE: Multidetector CT imaging through the chest, abdomen and pelvis was performed using the standard protocol during bolus administration of intravenous contrast. Multiplanar reconstructed images and MIPs were obtained and reviewed to evaluate  the vascular anatomy. CONTRAST:  100 mL Isovue 370 COMPARISON:  Thoracic spine MRI 05/26/2016 FINDINGS: CTA CHEST FINDINGS Cardiovascular: The aortic root at the sinuses of Valsalva measures 4.5 cm. The mid ascending thoracic aorta is aneurysmal measuring up to 4.2 cm. 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 The great vessels are patent. Negative for an aortic dissection. Proximal descending thoracic aorta measures 3.2 cm. Pulmonary arteries are patent. Mediastinum/Nodes: No evidence for chest lymphadenopathy. No significant pericardial fluid. Lungs/Pleura: Trachea and mainstem bronchi are patent. Focal pleural thickening or nodularity along the left major fissure on sequence 7, image 80 measuring up to 5 mm. Otherwise, the lungs are clear. No significant airspace disease or consolidation. No pleural effusions. Musculoskeletal: No acute bone abnormality. Review of the MIP images confirms the above findings. CTA ABDOMEN AND PELVIS FINDINGS VASCULAR Aorta: Normal caliber aorta without aneurysm, dissection, vasculitis or significant stenosis. Celiac: Patent without evidence of aneurysm, dissection, vasculitis or significant stenosis. SMA: Patent without evidence of aneurysm, dissection, vasculitis or significant stenosis. Renals: Both renal arteries are  patent without evidence of aneurysm, dissection, vasculitis, fibromuscular dysplasia or significant stenosis. There is an accessory inferior left renal artery which is patent. IMA: Patent without evidence of aneurysm, dissection, vasculitis or significant stenosis. Inflow: Mild wall calcifications in the iliac arteries without significant stenosis. Internal and external iliac arteries are patent bilaterally. No dissection. Veins: No obvious venous abnormality within the limitations of this arterial phase study. Review of the MIP images confirms the above findings. NON-VASCULAR Hepatobiliary: No acute abnormality to the liver or gallbladder. Pancreas: Normal appearance of the pancreas without inflammation or duct dilatation. Spleen: Normal appearance of spleen without enlargement. Adrenals/Urinary Tract: Normal adrenal glands. Normal appearance of both kidneys without suspicious lesion or hydronephrosis. Urinary bladder is decompressed with a Foley catheter. Stomach/Bowel: There is a large amount of stool in the right colon and hepatic flexure. No evidence for bowel obstruction or focal inflammation. Lymphatic: No significant lymph node enlargement in the abdomen and pelvis. Soft tissue in the mesentery adjacent to loops of bowel on sequence 6, image 257 is nonspecific and may represent a few mesenteric lymph nodes. Reproductive: Prostate is unremarkable. Seminal vesicles are unremarkable. Other: No significant ascites in the abdomen or pelvis. Musculoskeletal: Mild retrolisthesis at L4-L5 with some disc space narrowing. Refer to recent spine MRI. Review of the MIP images confirms the above findings. IMPRESSION: Negative for an aortic dissection. No acute intrathoracic or intra-abdominal disease. 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 Ascending thoracic aorta measures up to 4.2 cm. Recommend annual imaging followup by CTA or MRA. This recommendation follows 2010  ACCF/AHA/AATS/ACR/ASA/SCA/SCAI/SIR/STS/SVM Guidelines for the Diagnosis and Management of Patients with Thoracic Aortic Disease. Circulation. 2010; 121: e266-e369 5 mm nodule along the left major fissure is indeterminate. No follow-up needed if patient is low-risk. Non-contrast chest CT can be considered in 12 months if patient is high-risk. This recommendation follows the consensus statement: Guidelines for Management of Incidental Pulmonary Nodules Detected on CT Images: From the Fleischner Society 2017; Radiology 2017; 284:228-243. Large amount of stool involving the right colon. Electronically Signed   By: Richarda Overlie M.D.   On: 05/26/2016 15:24    Labs:  CBC:  Recent Labs  05/29/16 1036 05/30/16 0314 06/02/16 1014 06/12/16 1146  WBC 11.2* 8.5 11.9* 8.0  HGB 13.5 12.8* 14.1 14.5  HCT 39.6 38.4* 40.9 42.6  PLT 237 201 207 203    COAGS: No  results for input(s): INR, APTT in the last 8760 hours.  BMP:  Recent Labs  05/30/16 0314 06/02/16 1014 06/03/16 0547 06/12/16 1146  NA 137 134* 136 132*  K 4.2 3.9 4.6 4.1  CL 103 99* 97* 95*  CO2 24 27 31 27   GLUCOSE 160* 147* 96 112*  BUN 23* 27* 27* 27*  CALCIUM 9.1 8.8* 9.0 9.6  CREATININE 1.15 1.20 1.19 1.07  GFRNONAA >60 >60 >60 >60  GFRAA >60 >60 >60 >60    LIVER FUNCTION TESTS:  Recent Labs  05/23/16 2229 06/02/16 1014  BILITOT 0.7 1.2  AST 26 108*  ALT 27 316*  ALKPHOS 71 65  PROT 7.6 6.6  ALBUMIN 4.5 3.5    TUMOR MARKERS: No results for input(s): AFPTM, CEA, CA199, CHROMGRNA in the last 8760 hours.  Assessment and Plan:  Thoracic spinal cord edema Now scheduled for spinal arteriogram for full evaluation and possible treatment Risks and Benefits discussed with the patient including, but not limited to bleeding, infection, vascular injury, contrast induced renal failure, stroke or even death. All of the patient's questions were answered, patient is agreeable to proceed. Consent signed and in chart. Dr  Corliss Skains has seen and evaluated pt. Plan for procedure 2/6  Thank you for this interesting consult.  I greatly enjoyed meeting Effie Janoski and look forward to participating in their care.  A copy of this report was sent to the requesting provider on this date.  Electronically Signed: Ralene Muskrat A 06/15/2016, 4:40 PM   I spent a total of 40 Minutes    in face to face in clinical consultation, greater than 50% of which was counseling/coordinating care for spinal arteriogram with possible embolization

## 2016-06-15 NOTE — Progress Notes (Signed)
Physical Therapy Session Note  Patient Details  Name: Manases Etchison MRN: [id number] Date of Birth: 1961-01-20  Today's Date: 06/15/2016 PT Individual Time: 1303-1400 PT Individual Time Calculation (min): 57 min   Short Term Goals: Week 2:  PT Short Term Goal 1 (Week 2): Pt will be able to manage w/c parts to set up for transfers with supervision cues PT Short Term Goal 2 (Week 2): Pt will complete squat pivot transfers with supervision.  PT Short Term Goal 3 (Week 2): Pt will perform bed mobility with supervision.  Skilled Therapeutic Interventions/Progress Updates: Pt presented in bed requesting to perform therex activities that was performed previous day. Performed supine therex on LLE AAROM/AROM, instructed in use of belt so pt can perform AAROM independently. Pt performed ankle pumps, SAQ, SLR, hip abd/add, heel slides hip IR/ER 2 x 10 on LLE and PROM by PTA on RLE.  Pt requiring additional breaks this pm due to increased ms spasms. Nsg aware and pre-medicated prior to session. At end of session pt remained in bed with call bell within reach and all needs met.      Therapy Documentation Precautions:  Precautions Precautions: Fall Precaution Comments: RLE hemiparesis Restrictions Weight Bearing Restrictions: No General:   Vital Signs: Therapy Vitals Temp: 97.9 F (36.6 C) Temp Source: Oral Pulse Rate: 94 Resp: (!) 94 BP: 119/85 Patient Position (if appropriate): Sitting Oxygen Therapy SpO2: 97 % O2 Device: Not Delivered     See Function Navigator for Current Functional Status.   Therapy/Group: Individual Therapy  Sandra Brents  Jakeria Caissie, PTA  06/15/2016, 4:23 PM

## 2016-06-15 NOTE — Progress Notes (Signed)
[facility] PHYSICAL MEDICINE & REHABILITATION     PROGRESS NOTE    Subjective/Complaints:   Feeling ok this morning. Watched game last night. Denies any new pain or weakness this morning. stooled a lot yesterday---refused miralax. Still no urge to empty bladder.  ROS: pt denies nausea, vomiting, diarrhea, cough, shortness of breath or chest pain       Objective: Vital Signs: Blood pressure 130/70, pulse 90, temperature 98 F (36.7 C), temperature source Oral, resp. rate (!) 128, height 6\' 1"  (1.854 m), weight 97.6 kg (215 lb 2.7 oz), SpO2 100 %. No results found.  Recent Labs  06/12/16 1146  WBC 8.0  HGB 14.5  HCT 42.6  PLT 203    Recent Labs  06/12/16 1146  NA 132*  K 4.1  CL 95*  GLUCOSE 112*  BUN 27*  CREATININE 1.07  CALCIUM 9.6   CBG (last 3)  No results for input(s): GLUCAP in the last 72 hours.  Wt Readings from Last 3 Encounters:  06/10/16 97.6 kg (215 lb 2.7 oz)  05/26/16 102.1 kg (225 lb)    Physical Exam:  HENT:  Head: Normocephalic and atraumatic.  Eyes: Conjunctivae and EOM are normal. Left eye exhibits no discharge.  Neck: Normal range of motion. Neck supple. No thyromegaly present.  Cardiovascular:RRR Respiratory: CTA B GI: Soft. Bowel sounds are normal. He exhibits no distension.  Genitourinary: no changes  Musculoskeletal: He exhibits no edema or tenderness.  Right thigh non-tender Neurological:  Neurological: He is alert and oriented to person, place, and time.  Pt alert and oriented.  B/l UE 5/5.  RLE: tr/5 HF, 0/5 distally--no change  LLE: 2+/5 HF, 3- KE and 3-/5 ADF/PF, 2 minus, hip abduction -stable Sensation intact to light touch in left lower limb, reduced around the right knee and right anterior thigh  DTRs 1+ LLE.  no resting tone Skin: Skin remains warm and dry.  Psych: pleasant and appropriate   Assessment/Plan: 1. Functional and mobility deficits secondary to thoracic myelopathy which require 3+ hours per day of  interdisciplinary therapy in a comprehensive inpatient rehab setting. Physiatrist is providing close team supervision and 24 hour management of active medical problems listed below. Physiatrist and rehab team continue to assess barriers to discharge/monitor patient progress toward functional and medical goals.  Function:  Bathing Bathing position   Position: Wheelchair/chair at sink  Bathing parts Body parts bathed by patient: Right arm, Left arm, Abdomen, Chest, Front perineal area, Buttocks, Right upper leg, Left upper leg, Right lower leg, Left lower leg Body parts bathed by helper: Buttocks  Bathing assist Assist Level: Touching or steadying assistance(Pt > 75%)   Set up : To obtain items  Upper Body Dressing/Undressing Upper body dressing   What is the patient wearing?: Pull over shirt/dress     Pull over shirt/dress - Perfomed by patient: Thread/unthread right sleeve, Thread/unthread left sleeve, Put head through opening, Pull shirt over trunk          Upper body assist Assist Level: No help, No cues   Set up : To obtain clothing/put away  Lower Body Dressing/Undressing Lower body dressing   What is the patient wearing?: Pants, Non-skid slipper socks Underwear - Performed by patient: Thread/unthread left underwear leg, Pull underwear up/down Underwear - Performed by helper: Thread/unthread right underwear leg, Thread/unthread left underwear leg, Pull underwear up/down Pants- Performed by patient: Thread/unthread left pants leg Pants- Performed by helper: Thread/unthread right pants leg, Thread/unthread left pants leg, Pull pants up/down  Non-skid slipper socks- Performed by helper: Don/doff right sock, Don/doff left sock     Shoes - Performed by patient: Fasten right, Fasten left Shoes - Performed by helper: Don/doff right shoe, Don/doff left shoe, Fasten right, Fasten left AFO - Performed by patient: Don/doff right AFO     TED Hose - Performed by helper: Don/doff  right TED hose, Don/doff left TED hose  Lower body assist Assist for lower body dressing: Touching or steadying assistance (Pt > 75%)      Toileting Toileting Toileting activity did not occur: No continent bowel/bladder event Toileting steps completed by patient: Performs perineal hygiene Toileting steps completed by helper: Adjust clothing prior to toileting, Adjust clothing after toileting Toileting Assistive Devices: Grab bar or rail  Toileting assist Assist level: Supervision or verbal cues   Transfers Chair/bed transfer Chair/bed transfer activity did not occur: N/A Chair/bed transfer method: Squat pivot Chair/bed transfer assist level: Touching or steadying assistance (Pt > 75%) Chair/bed transfer assistive device: Armrests     Locomotion Ambulation     Max distance: 5 Assist level: 2 helpers   Wheelchair   Type: Manual Max wheelchair distance: >150 ft Assist Level: Supervision or verbal cues  Cognition Comprehension Comprehension assist level: Follows complex conversation/direction with no assist  Expression Expression assist level: Expresses complex ideas: With no assist  Social Interaction Social Interaction assist level: Interacts appropriately with others with medication or extra time (anti-anxiety, antidepressant).  Problem Solving Problem solving assist level: Solves complex problems: Recognizes & self-corrects  Memory Memory assist level: Complete Independence: No helper   Medical Problem List and Plan: 1.  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   - Dr. 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  DVT Prophylaxis/Anticoagulation: SCDs. Venous doppler study negative  -conitnue q12  lovenox, given some evidence of increased hemorrhage would hold   -hold lovenox pending angio 3. Pain Management: Hydrocodone as needed 4. Hypertension. Norvasc 10 mg daily Monitor when out of bed . Appears controlled at present Vitals:   06/14/16 1345 06/15/16 0600  BP: 139/75 130/70  Pulse: 89 90  Resp: 20 (!) 128  Temp: 98 F (36.7 C) 98 F (36.7 C)   5. Neuropsych: This patient is capable of making decisions on his own behalf.  -provide ego support as needed   6. Skin/Wound Care: Routine skin checks 7. Fluids/Electrolytes/Nutrition:encourage PO  -recent labs stable  8.CRI stage III. Follow-up chemistries next week 9. Neurogenic bowel bladder.  -continue voiding trial. Not sensing need to empty.    - reduce urecholine to 25mg  given loose stool.    -I/o cath prn    -having continent bowel movements---now loose   -hold miralax  LOS (Days) 14 A FACE TO FACE EVALUATION WAS PERFORMED  Faith RogueSWARTZ,Lancelot Alyea T, MD 06/15/2016 8:50 AM

## 2016-06-15 NOTE — Progress Notes (Signed)
Occupational Therapy Session Note  Patient Details  Name: Perry GraffJamie Naples MRN: [id number] Date of Birth: 05/31/1960  Today's Date: 06/15/2016 OT Individual Time: 1431-1535 OT Individual Time Calculation (min): 64 min   Skilled Therapeutic Interventions/Progress Updates: Skilled OT session completed with focus on UE strengthening, activity tolerance, and psychosocial wellness. Pt was lying in bed at time of arrival, initially refusing tx due to fatigue but then agreeing with encouragement. Supine<sit with HOB raised completed with steady assist and extra time. Squat pivot<w/c completed with min guard and min instruction on technique. Pt self propelled to outdoor setting, navigating over thresholds and inclines with extra time and 2 rest breaks due to exertion. Once outside, pt engaged in meaningful leisure activity of pickle ball w/c level for endurance and psychosocial wellbeing (he was on a pickle ball league). After pt was taken to tub room and initiated education regarding tub bench. Education to continue in future sessions. Afterwards pt self propelled back to room in manner as written above. Once in room, he completed 10 w/c push ups for strengthening/pressure relief and weightbearing through LEs. Pt then transferred back to bed in manner as written above, required Mod A for sit<supine. He was repositioned for comfort and left with all needs within reach at time of departure.      Therapy Documentation Precautions:  Precautions Precautions: Fall Precaution Comments: RLE hemiparesis Restrictions Weight Bearing Restrictions: No General:   Vital Signs: Therapy Vitals Temp: 97.9 F (36.6 C) Temp Source: Oral Pulse Rate: 94 Resp: (!) 94 BP: 119/85 Patient Position (if appropriate): Sitting Oxygen Therapy SpO2: 97 % O2 Device: Not Delivered Pain: No c/o pain during session    ADL:      See Function Navigator for Current Functional Status.   Therapy/Group: Individual  Therapy  Rhyann Berton A Ottie Tillery 06/15/2016, 4:12 PM

## 2016-06-15 NOTE — Progress Notes (Signed)
Occupational Therapy Session Note  Patient Details  Name: Perry Morrison MRN: [id number] Date of Birth: 03/31/1961  Today's Date: 06/15/2016 OT Individual Time: [id number] OT Individual Time Calculation (min): 60 min    Short Term Goals: Week 2:  OT Short Term Goal 1 (Week 2): Pt will complete one grooming task in standing to increase functional standing balance/ tolerance OT Short Term Goal 2 (Week 2): Pt will complete 3/3 toileting tasks with min A OT Short Term Goal 3 (Week 2): Pt will transfer to toilet with close supervision to decrease caregiver burden OT Short Term Goal 4 (Week 2): Will initiate shower transfer goal utilizing tub/shower combination with tub transfer bench in prep for d/c  Skilled Therapeutic Interventions/Progress Updates:    Pt seen for OT ADL bathing/dressing session. Pt in supine upon arrival, agreeable to tx session and desiring to shower.  Transferred to EOB with assist for management of R LE. Completed sliding board transfer with close supervision and assist to steady equipment. Pt opted to use STEADY for transfer in/out of shower, standing into STEADY with guarding assist.  He dressed seated inw/c, able to cross R ankle over knee to don lotion and thread pants on. Min A required for maintaining figure four position of L LE. Pt opting to complete push up with therapist pulling pants up vs completing lateral leans due to fatigue. From w/c level, pt gathered laundry items using reacher to assist with obtaining items from low drawer. Therapist completed laundry for pt and made NT aware. Pt left sitting in w/c at end of session, all needs in reach.   Therapy Documentation Precautions:  Precautions Precautions: Fall Precaution Comments: RLE hemiparesis Restrictions Weight Bearing Restrictions: No Pain:   No/ denies pain  See Function Navigator for Current Functional Status.   Therapy/Group: Individual Therapy  [name], Saralyn Willison C 06/15/2016, 7:07 AM

## 2016-06-15 NOTE — Progress Notes (Signed)
Physical Therapy Session Note  Patient Details  Name: Perry GraffJamie Threat MRN: [id number] Date of Birth: 01/06/1961  Today's Date: 06/15/2016 PT Individual Time: 1000-1100 PT Individual Time Calculation (min): 60 min   Short Term Goals: Week 2:  PT Short Term Goal 1 (Week 2): Pt will be able to manage w/c parts to set up for transfers with supervision cues PT Short Term Goal 2 (Week 2): Pt will complete squat pivot transfers with supervision.  PT Short Term Goal 3 (Week 2): Pt will perform bed mobility with supervision.  Skilled Therapeutic Interventions/Progress Updates: Pt received seated in w/c, denies pain and agreeable to treatment. Pt dons shoes with minA for crossing LLE across opposite knee into figure 4 position, S for remainder of task and significantly increased time d/t BLE paresis, poor sitting balance.  Discussed importance of stretching with pt to improve independence with functional tasks. Note worsening of sitting balance with pt requiring stability of one hand on arm rest to reach outside chair and retrieve cell phone from bed directly beside him. W/c propulsion to gym modI with BUE x175'. Transfer w/c <>mat table with transfer board and close S; min cues for w/c setup and technique. Sit >supine minA for assisting pt with management of RLE. Supine>long sit minA to boost onto elbows, then min guard. Sitting balance with close S once pt achieved neutral position. Hip ER and hamstring stretch in long sitting 2x2 min each position. Pt doffs and dons shoes in long sitting with S and increased time; cues for problem solving lifting LE off mat. Long sit >short sit with S and increased time. Returned to w/c as above and w/c propulsion to return to room modI. Remained seated in w/c at end of session, all needs in reach.      Therapy Documentation Precautions:  Precautions Precautions: Fall Precaution Comments: RLE hemiparesis Restrictions Weight Bearing Restrictions: No   See Function  Navigator for Current Functional Status.   Therapy/Group: Individual Therapy  [name] 06/15/2016, 10:48 AM

## 2016-06-16 ENCOUNTER — Inpatient Hospital Stay (HOSPITAL_COMMUNITY): Payer: Medicaid Other | Admitting: Certified Registered Nurse Anesthetist

## 2016-06-16 ENCOUNTER — Other Ambulatory Visit (HOSPITAL_COMMUNITY): Payer: Self-pay

## 2016-06-16 ENCOUNTER — Encounter (HOSPITAL_COMMUNITY): Payer: Self-pay | Admitting: Anesthesiology

## 2016-06-16 ENCOUNTER — Encounter (HOSPITAL_COMMUNITY)
Admission: RE | Disposition: A | Discharge status: Home Health | Payer: Self-pay | Source: Intra-hospital | Attending: Physical Medicine & Rehabilitation

## 2016-06-16 ENCOUNTER — Inpatient Hospital Stay (HOSPITAL_COMMUNITY): Payer: Self-pay | Admitting: Physical Therapy

## 2016-06-16 ENCOUNTER — Inpatient Hospital Stay (HOSPITAL_COMMUNITY): Payer: Medicaid Other

## 2016-06-16 ENCOUNTER — Encounter (HOSPITAL_COMMUNITY): Payer: Self-pay | Admitting: Psychology

## 2016-06-16 ENCOUNTER — Inpatient Hospital Stay: Admit: 2016-06-16 | Payer: Self-pay | Admitting: Interventional Radiology

## 2016-06-16 HISTORY — PX: RADIOLOGY WITH ANESTHESIA: SHX6223

## 2016-06-16 HISTORY — PX: IR GENERIC HISTORICAL: IMG1180011

## 2016-06-16 LAB — POCT ACTIVATED CLOTTING TIME: Activated Clotting Time: 158 seconds

## 2016-06-16 LAB — COMPREHENSIVE METABOLIC PANEL
ALK PHOS: 91 U/L (ref 38–126)
ALT: 170 U/L — ABNORMAL HIGH (ref 17–63)
ANION GAP: 10 (ref 5–15)
AST: 60 U/L — ABNORMAL HIGH (ref 15–41)
Albumin: 3.4 g/dL — ABNORMAL LOW (ref 3.5–5.0)
BILIRUBIN TOTAL: 1 mg/dL (ref 0.3–1.2)
BUN: 18 mg/dL (ref 6–20)
CALCIUM: 9.3 mg/dL (ref 8.9–10.3)
CO2: 26 mmol/L (ref 22–32)
Chloride: 99 mmol/L — ABNORMAL LOW (ref 101–111)
Creatinine, Ser: 0.97 mg/dL (ref 0.61–1.24)
Glucose, Bld: 115 mg/dL — ABNORMAL HIGH (ref 65–99)
Potassium: 4.3 mmol/L (ref 3.5–5.1)
SODIUM: 135 mmol/L (ref 135–145)
Total Protein: 6.3 g/dL — ABNORMAL LOW (ref 6.5–8.1)

## 2016-06-16 LAB — APTT: aPTT: 24 seconds (ref 24–36)

## 2016-06-16 LAB — CBC WITH DIFFERENTIAL/PLATELET
Basophils Absolute: 0 10*3/uL (ref 0.0–0.1)
Basophils Relative: 0 %
EOS ABS: 0.3 10*3/uL (ref 0.0–0.7)
EOS PCT: 4 %
HCT: 37 % — ABNORMAL LOW (ref 39.0–52.0)
Hemoglobin: 12.6 g/dL — ABNORMAL LOW (ref 13.0–17.0)
LYMPHS ABS: 1.7 10*3/uL (ref 0.7–4.0)
Lymphocytes Relative: 29 %
MCH: 29.5 pg (ref 26.0–34.0)
MCHC: 34.1 g/dL (ref 30.0–36.0)
MCV: 86.7 fL (ref 78.0–100.0)
Monocytes Absolute: 0.5 10*3/uL (ref 0.1–1.0)
Monocytes Relative: 9 %
Neutro Abs: 3.4 10*3/uL (ref 1.7–7.7)
Neutrophils Relative %: 58 %
PLATELETS: 168 10*3/uL (ref 150–400)
RBC: 4.27 MIL/uL (ref 4.22–5.81)
RDW: 13.3 % (ref 11.5–15.5)
WBC: 5.8 10*3/uL (ref 4.0–10.5)

## 2016-06-16 LAB — PROTIME-INR
INR: 1.05
PROTHROMBIN TIME: 13.7 s (ref 11.4–15.2)

## 2016-06-16 LAB — PLATELET INHIBITION P2Y12: PLATELET FUNCTION P2Y12: 252 [PRU] (ref 194–418)

## 2016-06-16 SURGERY — RADIOLOGY WITH ANESTHESIA
Anesthesia: General

## 2016-06-16 MED ORDER — MIDAZOLAM HCL 5 MG/5ML IJ SOLN
INTRAMUSCULAR | Status: DC | PRN
  Administered 2016-06-16: 2 mg via INTRAVENOUS

## 2016-06-16 MED ORDER — IOPAMIDOL (ISOVUE-300) INJECTION 61%
INTRAVENOUS | Status: AC
  Administered 2016-06-16: 50 mL
  Filled 2016-06-16: qty 150

## 2016-06-16 MED ORDER — PHENYLEPHRINE HCL 10 MG/ML IJ SOLN
INTRAVENOUS | Status: DC | PRN
  Administered 2016-06-16: 25 ug/min via INTRAVENOUS

## 2016-06-16 MED ORDER — IOPAMIDOL (ISOVUE-300) INJECTION 61%
INTRAVENOUS | Status: AC
  Administered 2016-06-16: 100 mL
  Filled 2016-06-16: qty 150

## 2016-06-16 MED ORDER — IOPAMIDOL (ISOVUE-300) INJECTION 61%
INTRAVENOUS | Status: AC
  Administered 2016-06-16: 150 mL
  Filled 2016-06-16: qty 150

## 2016-06-16 MED ORDER — IOPAMIDOL (ISOVUE-300) INJECTION 61%
INTRAVENOUS | Status: AC
  Filled 2016-06-16: qty 300

## 2016-06-16 MED ORDER — ROCURONIUM BROMIDE 100 MG/10ML IV SOLN
INTRAVENOUS | Status: DC | PRN
  Administered 2016-06-16: 80 mg via INTRAVENOUS
  Administered 2016-06-16 (×2): 20 mg via INTRAVENOUS

## 2016-06-16 MED ORDER — HEPARIN SODIUM (PORCINE) 1000 UNIT/ML IJ SOLN
INTRAMUSCULAR | Status: DC | PRN
  Administered 2016-06-16: 1000 [IU] via INTRAVENOUS
  Administered 2016-06-16: 3000 [IU] via INTRAVENOUS
  Administered 2016-06-16: 1000 [IU] via INTRAVENOUS

## 2016-06-16 MED ORDER — LIDOCAINE HCL (CARDIAC) 20 MG/ML IV SOLN
INTRAVENOUS | Status: DC | PRN
  Administered 2016-06-16: 60 mg via INTRAVENOUS

## 2016-06-16 MED ORDER — FENTANYL CITRATE (PF) 100 MCG/2ML IJ SOLN
INTRAMUSCULAR | Status: DC | PRN
  Administered 2016-06-16: 50 ug via INTRAVENOUS
  Administered 2016-06-16: 100 ug via INTRAVENOUS

## 2016-06-16 MED ORDER — IOPAMIDOL (ISOVUE-300) INJECTION 61%
INTRAVENOUS | Status: AC
  Administered 2016-06-16: 100 mL
  Filled 2016-06-16: qty 100

## 2016-06-16 MED ORDER — PROPOFOL 10 MG/ML IV BOLUS
INTRAVENOUS | Status: DC | PRN
  Administered 2016-06-16: 160 mg via INTRAVENOUS

## 2016-06-16 MED ORDER — SUGAMMADEX SODIUM 500 MG/5ML IV SOLN
INTRAVENOUS | Status: DC | PRN
  Administered 2016-06-16: 400 mg via INTRAVENOUS

## 2016-06-16 MED ORDER — ONDANSETRON HCL 4 MG/2ML IJ SOLN
INTRAMUSCULAR | Status: DC | PRN
  Administered 2016-06-16: 4 mg via INTRAVENOUS

## 2016-06-16 NOTE — Progress Notes (Signed)
Pt c/o being full in abdomen and unable to sleep. Last cath was at 220. Bladder scanned pt read 380ml. Pt has IV fluids running at 5275ml/hr. Straight cath. Him for 350ml. Abdomen still distended. Pt feels swollen in abdomen area but does not feel full anymore. Had a BM at the change of shift and abdomen was not distended. Pt stated the reason he came to the hospital was for his abdomen and back. Will pass on in shift change.

## 2016-06-16 NOTE — Transfer of Care (Signed)
Immediate Anesthesia Transfer of Care Note  Patient: Perry Morrison  Procedure(s) Performed: Procedure(s): SPINAL ANGIOGRAM (N/A)  Patient Location: PACU  Anesthesia Type:General  Level of Consciousness: awake, alert  and oriented  Airway & Oxygen Therapy: Patient Spontanous Breathing and Patient connected to nasal cannula oxygen  Post-op Assessment: Report given to RN and Post -op Vital signs reviewed and stable  Post vital signs: Reviewed and stable  Last Vitals:  Vitals:   06/16/16 0500 06/16/16 1410  BP: 129/79   Pulse: 81   Resp: 18   Temp: 36.9 C (P) 36.6 C    Last Pain:  Vitals:   06/16/16 0500  TempSrc: Oral  PainSc:       Patients Stated Pain Goal: 3 (06/09/16 1507)  Complications: No apparent anesthesia complications

## 2016-06-16 NOTE — Sedation Documentation (Signed)
Sheath to left groin removed. V-pad in place.

## 2016-06-16 NOTE — Progress Notes (Signed)
Pt missed all therapies today due to a medical procedure.

## 2016-06-16 NOTE — Anesthesia Preprocedure Evaluation (Addendum)
Anesthesia Evaluation  Patient identified by MRN, date of birth, ID band Patient awake    Reviewed: Allergy & Precautions, NPO status , Patient's Chart, lab work & pertinent test results  Airway Mallampati: I  TM Distance: >3 FB Neck ROM: Full    Dental  (+) Teeth Intact, Dental Advisory Given   Pulmonary neg pulmonary ROS,    breath sounds clear to auscultation       Cardiovascular hypertension, Pt. on medications  Rhythm:Regular Rate:Normal     Neuro/Psych negative neurological ROS  negative psych ROS   GI/Hepatic Neg liver ROS, GERD  Medicated,  Endo/Other  negative endocrine ROS  Renal/GU CRFRenal disease  negative genitourinary   Musculoskeletal  (+) Arthritis , Osteoarthritis,    Abdominal   Peds negative pediatric ROS (+)  Hematology negative hematology ROS (+)   Anesthesia Other Findings   Reproductive/Obstetrics negative OB ROS                            Anesthesia Physical Anesthesia Plan  ASA: II  Anesthesia Plan: General   Post-op Pain Management:    Induction: Intravenous  Airway Management Planned: Oral ETT  Additional Equipment:   Intra-op Plan:   Post-operative Plan: Extubation in OR  Informed Consent: I have reviewed the patients History and Physical, chart, labs and discussed the procedure including the risks, benefits and alternatives for the proposed anesthesia with the patient or authorized representative who has indicated his/her understanding and acceptance.   Dental advisory given  Plan Discussed with: CRNA  Anesthesia Plan Comments:         Anesthesia Quick Evaluation

## 2016-06-16 NOTE — Sedation Documentation (Signed)
5 Fr. Exoseal to right groin 

## 2016-06-16 NOTE — Anesthesia Procedure Notes (Signed)
Procedure Name: Intubation Date/Time: 06/16/2016 10:19 AM Performed by: Kyung Rudd Pre-anesthesia Checklist: Patient identified, Emergency Drugs available, Suction available and Patient being monitored Patient Re-evaluated:Patient Re-evaluated prior to inductionOxygen Delivery Method: Circle system utilized Preoxygenation: Pre-oxygenation with 100% oxygen Intubation Type: IV induction Ventilation: Mask ventilation without difficulty Laryngoscope Size: Mac and 4 Grade View: Grade I Tube type: Oral Tube size: 7.5 mm Number of attempts: 1 Airway Equipment and Method: Stylet Placement Confirmation: ETT inserted through vocal cords under direct vision,  positive ETCO2 and breath sounds checked- equal and bilateral Secured at: 22 cm Tube secured with: Tape Dental Injury: Teeth and Oropharynx as per pre-operative assessment

## 2016-06-16 NOTE — Progress Notes (Signed)
#  16 french foley cath inserted without difficulty--yellow urine coming forth.

## 2016-06-16 NOTE — Progress Notes (Signed)
[facility]IT[facility]     PROGRESS NOTE    Subjective/Complaints:   No new issues this morning. Still not emptying/requiring I/o caths. Awaiting procedure today  ROS: pt denies nausea, vomiting, diarrhea, cough, shortness of breath or chest pain        Objective: Vital Signs: Blood pressure 129/79, pulse 81, temperature 98.4 F (36.9 C), temperature source Oral, resp. rate 18, height 6\' 1"  (1.854 m), weight 98.4 kg (216 lb 14.9 oz), SpO2 100 %. No results found.  Recent Labs  06/16/16 0731  WBC 5.8  HGB 12.6*  HCT 37.0*  PLT 168   No results for input(s): NA, K, CL, GLUCOSE, BUN, CREATININE, CALCIUM in the last 72 hours.  Invalid input(s): CO CBG (last 3)  No results for input(s): GLUCAP in the last 72 hours.  Wt Readings from Last 3 Encounters:  06/15/16 98.4 kg (216 lb 14.9 oz)  05/26/16 102.1 kg (225 lb)    Physical Exam:  HENT:  Head: Normocephalic and atraumatic.  Eyes: Conjunctivae and EOM are normal. Left eye exhibits no discharge.  Neck: Normal range of motion. Neck supple. No thyromegaly present.  Cardiovascular: RRR Respiratory: CTA bilaterally GI: Soft. Bowel sounds are normal. He exhibits no distension.  Genitourinary: no changes  Musculoskeletal: He exhibits no edema or tenderness.  Right thigh non-tender Neurological:  Neurological: He is alert and oriented to person, place, and time.  Pt alert and oriented.  B/l UE 5/5.  RLE: 0/5 HF to distal  LLE: 2+/5 HF, 3- KE and 3-/5 ADF/PF, 2 minus, hip abduction -stable Sensation intact to light touch in left lower limb, reduced around the right knee and right anterior thigh  DTRs 1+ LLE.  no resting tone Skin: Skin remains warm and dry.  Psych: pleasant and appropriate/engaging   Assessment/Plan: 1. Functional and mobility deficits secondary to thoracic myelopathy which require 3+ hours per day of interdisciplinary therapy in a comprehensive inpatient rehab  setting. Physiatrist is providing close team supervision and 24 hour management of active medical problems listed below. Physiatrist and rehab team continue to assess barriers to discharge/monitor patient progress toward functional and medical goals.  Function:  Bathing Bathing position   Position: Shower  Bathing parts Body parts bathed by patient: Right arm, Left arm, Abdomen, Chest, Front perineal area, Right upper leg, Left upper leg, Right lower leg, Left lower leg, Back Body parts bathed by helper: Buttocks  Bathing assist Assist Level: Touching or steadying assistance(Pt > 75%)   Set up : To obtain items  Upper Body Dressing/Undressing Upper body dressing   What is the patient wearing?: Pull over shirt/dress     Pull over shirt/dress - Perfomed by patient: Thread/unthread right sleeve, Thread/unthread left sleeve, Put head through opening, Pull shirt over trunk          Upper body assist Assist Level: No help, No cues   Set up : To obtain clothing/put away  Lower Body Dressing/Undressing Lower body dressing   What is the patient wearing?: Pants, Non-skid slipper socks Underwear - Performed by patient: Thread/unthread left underwear leg, Pull underwear up/down Underwear - Performed by helper: Thread/unthread right underwear leg, Thread/unthread left underwear leg, Pull underwear up/down Pants- Performed by patient: Thread/unthread right pants leg, Thread/unthread left pants leg Pants- Performed by helper: Pull pants up/down Non-skid slipper socks- Performed by patient: Don/doff right sock, Don/doff left sock Non-skid slipper socks- Performed by helper: Don/doff right sock, Don/doff left sock     Shoes -  Performed by patient: Don/doff right shoe, Don/doff left shoe, Fasten right Shoes - Performed by helper: Fasten left (assist with placement of LLE and stabilize leg on opposite knee while pt tied shoe) AFO - Performed by patient: Don/doff right AFO     TED Hose -  Performed by helper: Don/doff right TED hose, Don/doff left TED hose  Lower body assist Assist for lower body dressing: Touching or steadying assistance (Pt > 75%)      Toileting Toileting Toileting activity did not occur: No continent bowel/bladder event Toileting steps completed by patient: Performs perineal hygiene Toileting steps completed by helper: Adjust clothing prior to toileting, Performs perineal hygiene, Adjust clothing after toileting Toileting Assistive Devices: Grab bar or rail  Toileting assist Assist level: Touching or steadying assistance (Pt.75%)   Transfers Chair/bed transfer Chair/bed transfer activity did not occur: N/A Chair/bed transfer method: Lateral scoot Chair/bed transfer assist level: Supervision or verbal cues Chair/bed transfer assistive device: Sliding board, Armrests     Locomotion Ambulation     Max distance: 5 Assist level: 2 helpers   Wheelchair   Type: Manual Max wheelchair distance: >150 ft Assist Level: No help, No cues, assistive device, takes more than reasonable amount of time  Cognition Comprehension Comprehension assist level: Follows complex conversation/direction with no assist  Expression Expression assist level: Expresses complex ideas: With no assist  Social Interaction Social Interaction assist level: Interacts appropriately with others with medication or extra time (anti-anxiety, antidepressant).  Problem Solving Problem solving assist level: Solves complex problems: Recognizes & self-corrects  Memory Memory assist level: Complete Independence: No helper   Medical Problem List and Plan: 1.  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   - catheter angiography today   -Neuro exam has remained stable over weekend  -continue CIR therapies, PT, OT as possible. Will likely miss much of today                  2.  DVT Prophylaxis/Anticoagulation: SCDs. Venous doppler study negative  -  holding lovenox pending angio 3. Pain Management: Hydrocodone as needed 4. Hypertension. Norvasc 10 mg daily Monitor when out of bed . Appears controlled at present Vitals:   06/15/16 1522 06/16/16 0500  BP: 119/85 129/79  Pulse: 94 81  Resp: (!) 94 18  Temp: 97.9 F (36.6 C) 98.4 F (36.9 C)   5. Neuropsych: This patient is capable of making decisions on his own behalf.  -provide ego support as needed   6. Skin/Wound Care: Routine skin checks 7. Fluids/Electrolytes/Nutrition:encourage PO  -recent labs stable  8.CRI stage III. Follow-up chemistries later this week potentially 9. Neurogenic bowel bladder.  -continue voiding trial. Not sensing need to empty.    - reduced urecholine to 25mg  given loose stool.    -I/o cath prn    -having continent bowel movements---now loose   -holding miralax  LOS (Days) 15 A FACE TO FACE EVALUATION WAS PERFORMED  Ranelle OysterSWARTZ,ZACHARY T, MD 06/16/2016 8:38 AM

## 2016-06-16 NOTE — Sedation Documentation (Signed)
Bedside report given to TimberonJena, Charity fundraiserN. Dressings to groins, bilateral checked, dry and intact. Pulses checked.

## 2016-06-16 NOTE — Procedures (Signed)
S/P 4 vessel cerebral  arteriogram And whole spinal arteriogram. RT CFA approach. Findings.. 1l Lt T9 and LT T 10 intercostal injections demonstrate separate asymmetric  hair pin loops.One at T9 more prominent to the level of T12.. However unable to clearly follow due to excessive gas and stool in the bowel.

## 2016-06-17 ENCOUNTER — Inpatient Hospital Stay (HOSPITAL_COMMUNITY): Payer: Self-pay | Admitting: Occupational Therapy

## 2016-06-17 ENCOUNTER — Encounter (HOSPITAL_COMMUNITY): Payer: Self-pay | Admitting: Interventional Radiology

## 2016-06-17 ENCOUNTER — Ambulatory Visit (HOSPITAL_COMMUNITY): Payer: Medicaid Other | Admitting: Psychology

## 2016-06-17 ENCOUNTER — Inpatient Hospital Stay (HOSPITAL_COMMUNITY): Payer: Self-pay | Admitting: Physical Therapy

## 2016-06-17 ENCOUNTER — Inpatient Hospital Stay (HOSPITAL_COMMUNITY): Payer: Medicaid Other | Admitting: Physical Therapy

## 2016-06-17 DIAGNOSIS — G8222 Paraplegia, incomplete: Secondary | ICD-10-CM

## 2016-06-17 DIAGNOSIS — Q288 Other specified congenital malformations of circulatory system: Secondary | ICD-10-CM

## 2016-06-17 DIAGNOSIS — F4323 Adjustment disorder with mixed anxiety and depressed mood: Secondary | ICD-10-CM

## 2016-06-17 DIAGNOSIS — G0491 Myelitis, unspecified: Secondary | ICD-10-CM

## 2016-06-17 NOTE — Progress Notes (Signed)
Physical Therapy Session Note  Patient Details  Name: Aurea GraffJamie Hickle MRN: [id number] Date of Birth: 07/15/1960  Today's Date: 06/17/2016 PT Individual Time: [id number] PT Individual Time Calculation (min): 70 min   Short Term Goals: Week 2:  PT Short Term Goal 1 (Week 2): Pt will be able to manage w/c parts to set up for transfers with supervision cues PT Short Term Goal 2 (Week 2): Pt will complete squat pivot transfers with supervision.  PT Short Term Goal 3 (Week 2): Pt will perform bed mobility with supervision.  Skilled Therapeutic Interventions/Progress Updates:    Pt received in bed, denying c/o pain & agreeable to tx. Pt reports he just received a suppository and requested to stay in bed due to that & feeling weak today. Pt performed LLE therapeutic exercises including ankle pumps, heel slides, short arc quads, and hip abduction slides (AAROM), all with cuing for proper technique. Therapist performed stretching to RLE (heel cord, hip/knee flexion, hip adductors and hamstrings). Pt reported need to use bathroom and completed slide board transfer bed<>BSC with total assist for slide board set up and min assist for balance during transfer. Pt with continent BM on toilet with distant supervision for sitting balance with BUE support but required steady assist for sitting balance without UE support. Pt able to complete peri-hygiene with therapist follow up to ensure cleanliness. Pt returned to bed and participated in BUE bicep curls with orange theraband. At end of session pt left in bed with visitors present, bed alarm set & all needs within reach.   Therapy Documentation Precautions:  Precautions Precautions: Fall Precaution Comments: RLE hemiparesis Other Brace/Splint: PRAFO R foot Restrictions Weight Bearing Restrictions: No    See Function Navigator for Current Functional Status.   Therapy/Group: Individual Therapy  Sandi MariscalVictoria M Bobbi Yount 06/17/2016, 12:31 PM

## 2016-06-17 NOTE — Progress Notes (Signed)
Occupational Therapy Session Note  Patient Details  Name: Perry GraffJamie Cappelli MRN: [id number] Date of Birth: 11/05/1960  Today's Date: 06/17/2016 OT Individual Time:0830-0945 OT Total Time: 75 min      Short Term Goals: Week 2:  OT Short Term Goal 1 (Week 2): Pt will complete one grooming task in standing to increase functional standing balance/ tolerance OT Short Term Goal 2 (Week 2): Pt will complete 3/3 toileting tasks with min A OT Short Term Goal 3 (Week 2): Pt will transfer to toilet with close supervision to decrease caregiver burden OT Short Term Goal 4 (Week 2): Will initiate shower transfer goal utilizing tub/shower combination with tub transfer bench in prep for d/c   Skilled Therapeutic Interventions/Progress Updates:    Pt seen for OT ADL bathing/dressing session and focusing on functional transfers. Pt in supine upon arrival, voicing increased fatigue from yesterday's procedure but willing to attempt therapy.  He transferred to EOB with assist to manage B LEs and use of bed rails to push up. He completed min A sliding board transfer to drop arm BSC with min A to place board. Pt with continent BM on BSC. While voiding, pt completed UB bathing/dressing with set-up assist and supervision. Pt voiced feeling as if dynamic sitting balance is worsening. Explained that if injury is progressing up levels of the spinal cord it could now be effecting core/ abdominal muscles therefore impacting sitting balance. Due to fatigue and instability, pt declining wanting to attempt LB bathing, and therefore completed total A. Assist to thread LEs into pants. He declined wanting to do lateral leans for clothing management and push up completed while therapist pulled pants up. He was able to complete leans for hygiene following BM, therapist assisted for thoroughness per pt's request. Sliding board transfer completed back to bed in same manner as described above with VCs for technique. Pt desiring to stay in  supine to rest at end of session. Mod A to return to supine. Left in supine with all needs in reach. Neuropsych entering as therapist exited. Continued discussion in regards to d/c planning and continuum of care.   Therapy Documentation Precautions:  Precautions Precautions: Fall Precaution Comments: RLE hemiparesis Other Brace/Splint: PRAFO R foot Restrictions Weight Bearing Restrictions: No Pain:   No/ denies pain  See Function Navigator for Current Functional Status.   Therapy/Group: Individual Therapy  [name], Marquies Wanat C 06/17/2016, 4:03 PM

## 2016-06-17 NOTE — Progress Notes (Signed)
Physical Therapy Session Note  Patient Details  Name: Perry GraffJamie Benedetti MRN: [id number] Date of Birth: 09/23/1960  Today's Date: 06/17/2016 PT Individual Time: 1300-1430 PT Individual Time Calculation (min): 90 min   Short Term Goals: Week 2:  PT Short Term Goal 1 (Week 2): Pt will be able to manage w/c parts to set up for transfers with supervision cues PT Short Term Goal 2 (Week 2): Pt will complete squat pivot transfers with supervision.  PT Short Term Goal 3 (Week 2): Pt will perform bed mobility with supervision.  Skilled Therapeutic Interventions/Progress Updates: Pt received supine in bed, denies pain but does c/o significant fatigue. Agreeable to treatment and agreeable to leave the room with encouragement. Pt dons leg loops in long sitting with back supported on bed, minA due to difficulty managing LEs. Long sit >short sit on EOB with leg loops and S with increased time. Transfer bed>w/c close S with transfer board; pt managing board, leg rests and arm rests. W/c propulsion during session modI on level surfaces. Pt dons shoes modA in w/c with difficulty crossing LLE over RLE d/t hip ROM deficits. Encouraged pt to continue hip ROM stretches outside of therapy; pt reports he has been doing some on his own but will continue more aggressively. Standing frame 2x8 min with stable vitals; performed glute sets, quad sets, mini-squats. LLE dominant in activities however do note R glute activation and trace quads. Squat pivot transfer w/c <>nustep with minA. Performed nustep x10 min total with BLE; RUE assisting with RLE ROM from flexion>extension d/t minimal activation on R side. Returned to room modI propulsion as above. Remained seated in w/c at end of session, all needs in reach.      Therapy Documentation Precautions:  Precautions Precautions: Fall Precaution Comments: RLE hemiparesis Other Brace/Splint: PRAFO R foot Restrictions Weight Bearing Restrictions: No   See Function Navigator for  Current Functional Status.   Therapy/Group: Individual Therapy  [name] 06/17/2016, 3:40 PM

## 2016-06-17 NOTE — Progress Notes (Signed)
[facility]IC[facility] RE[facility]ABILITATION     PROGRESS NOTE    Subjective/Complaints:   No problems overnight. Had large bm. Noticed some leg spasms again  ROS: pt denies nausea, vomiting, diarrhea, cough, shortness of breath or chest pain       Objective: Vital Signs: Blood pressure 107/67, pulse 87, temperature 98.9 F (37.2 C), temperature source Oral, resp. rate 18, height 6\' 1"  (1.854 m), weight 98.4 kg (216 lb 14.9 oz), SpO2 97 %. No results found.  Recent Labs  06/16/16 0731  WBC 5.8  HGB 12.6*  HCT 37.0*  PLT 168    Recent Labs  06/16/16 0731  NA 135  K 4.3  CL 99*  GLUCOSE 115*  BUN 18  CREATININE 0.97  CALCIUM 9.3   CBG (last 3)  No results for input(s): GLUCAP in the last 72 hours.  Wt Readings from Last 3 Encounters:  06/15/16 98.4 kg (216 lb 14.9 oz)  05/26/16 102.1 kg (225 lb)    Physical Exam:  HENT:  Head: Normocephalic and atraumatic.  Eyes: Conjunctivae and EOM are normal. Left eye exhibits no discharge.  Neck: Normal range of motion. Neck supple. No thyromegaly present.  Cardiovascular: RRR Respiratory: CTA B GI: Soft. Bowel sounds are normal. He exhibits no distension.  Genitourinary: no changes  Musculoskeletal: He exhibits no edema or tenderness.  Right thigh non-tender Neurological:  Neurological: He is alert and oriented to person, place, and time.  Pt alert and oriented.  B/l UE 5/5.  RLE: 0/5 HF to distal  LLE: 2+/5 HF, 3- KE and 3-/5 ADF/PF, 2 minus, hip abduction -no changes Sensation intact to light touch in left lower limb, reduced around the right knee and right anterior thigh  DTRs 1+ LLE.  no resting tone Skin: Skin remains warm and dry.  Psych: pleasant and appropriate/engaging   Assessment/Plan: 1. Functional and mobility deficits secondary to thoracic myelopathy which require 3+ hours per day of interdisciplinary therapy in a comprehensive inpatient rehab setting. Physiatrist is providing close team  supervision and 24 hour management of active medical problems listed below. Physiatrist and rehab team continue to assess barriers to discharge/monitor patient progress toward functional and medical goals.  Function:  Bathing Bathing position   Position: Shower  Bathing parts Body parts bathed by patient: Right arm, Left arm, Abdomen, Chest, Front perineal area, Right upper leg, Left upper leg, Right lower leg, Left lower leg, Back Body parts bathed by helper: Buttocks  Bathing assist Assist Level: Touching or steadying assistance(Pt > 75%)   Set up : To obtain items  Upper Body Dressing/Undressing Upper body dressing   What is the patient wearing?: Pull over shirt/dress     Pull over shirt/dress - Perfomed by patient: Thread/unthread right sleeve, Thread/unthread left sleeve, Put head through opening, Pull shirt over trunk          Upper body assist Assist Level: No help, No cues   Set up : To obtain clothing/put away  Lower Body Dressing/Undressing Lower body dressing   What is the patient wearing?: Pants, Non-skid slipper socks Underwear - Performed by patient: Thread/unthread left underwear leg, Pull underwear up/down Underwear - Performed by helper: Thread/unthread right underwear leg, Thread/unthread left underwear leg, Pull underwear up/down Pants- Performed by patient: Thread/unthread right pants leg, Thread/unthread left pants leg Pants- Performed by helper: Pull pants up/down Non-skid slipper socks- Performed by patient: Don/doff right sock, Don/doff left sock Non-skid slipper socks- Performed by helper: Don/doff right sock, Don/doff left  sock     Shoes - Performed by patient: Don/doff right shoe, Don/doff left shoe, Fasten right Shoes - Performed by helper: Fasten left (assist with placement of LLE and stabilize leg on opposite knee while pt tied shoe) AFO - Performed by patient: Don/doff right AFO     TED Hose - Performed by helper: Don/doff right TED hose,  Don/doff left TED hose  Lower body assist Assist for lower body dressing: Touching or steadying assistance (Pt > 75%)      Toileting Toileting Toileting activity did not occur: No continent bowel/bladder event Toileting steps completed by patient: Performs perineal hygiene Toileting steps completed by helper: Adjust clothing prior to toileting, Performs perineal hygiene, Adjust clothing after toileting Toileting Assistive Devices: Grab bar or rail  Toileting assist Assist level: Touching or steadying assistance (Pt.75%)   Transfers Chair/bed transfer Chair/bed transfer activity did not occur: N/A Chair/bed transfer method: Lateral scoot Chair/bed transfer assist level: Supervision or verbal cues Chair/bed transfer assistive device: Sliding board, Armrests     Locomotion Ambulation     Max distance: 5 Assist level: 2 helpers   Wheelchair   Type: Manual Max wheelchair distance: >150 ft Assist Level: No help, No cues, assistive device, takes more than reasonable amount of time  Cognition Comprehension Comprehension assist level: Follows complex conversation/direction with no assist  Expression Expression assist level: Expresses complex ideas: With no assist  Social Interaction Social Interaction assist level: Interacts appropriately with others - No medications needed.  Problem Solving Problem solving assist level: Solves basic 90% of the time/requires cueing < 10% of the time  Memory Memory assist level: Complete Independence: No helper   Medical Problem List and Plan: 1.  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   - angiogram c/w avm, couldn't be completed due to stool in colon    -follow up study on Monday  -continue CIR therapies, PT, OT as possible.   -resume therapies today                  2.  DVT Prophylaxis/Anticoagulation: SCDs. Venous doppler study negative  - holding lovenox pending angio 3. Pain Management:  Hydrocodone as needed 4. Hypertension. Norvasc 10 mg daily Monitor when out of bed . Appears controlled at present Vitals:   06/16/16 1536 06/17/16 0328  BP: 125/80 107/67  Pulse: 79 87  Resp: 16 18  Temp: 97.7 F (36.5 C) 98.9 F (37.2 C)   5. Neuropsych: This patient is capable of making decisions on his own behalf.  -provide ego support as needed   6. Skin/Wound Care: Routine skin checks  -groin sites clean/no hematoma 7. Fluids/Electrolytes/Nutrition:encourage PO  -recent labs stable  8.CRI stage III. Follow-up chemistries later this week potentially 9. Neurogenic bowel bladder.  -voiding attempts as possible    - reduced urecholine to 25mg  given loose stool.    -I/o cath prn    -having continent bowel movements---   -had large bm last night   LOS (Days) 16 A FACE TO FACE EVALUATION WAS PERFORMED  Ranelle OysterSWARTZ,Tashanti Dalporto T, MD 06/17/2016 8:42 AM

## 2016-06-17 NOTE — Progress Notes (Signed)
Physical Therapy Note  Patient Details  Name: Perry GraffJamie Wyrick MRN: [id number] Date of Birth: 08/15/1960 Today's Date: 06/17/2016    Pt missed all therapy 06/16/16 due to medical procedure. Continue per POC when cleared for participation.    Carolynn Commentlizabeth J Tygielski 06/17/2016, 7:40 AM

## 2016-06-17 NOTE — Progress Notes (Addendum)
S/p cerebral and spinal arteriogram yesterday. See full report for findings Pt feels ok BP 107/67 (BP Location: Right Arm)   Pulse 87   Temp 98.9 F (37.2 C) (Oral)   Resp 18   Ht 6\' 1"  (1.854 m)   Wt 216 lb 14.9 oz (98.4 kg)   SpO2 97%   BMI 28.62 kg/m  (R)groin soft, NT. No hematoma (L)groin soft, NT, no hematoma. Legs warm, bounding pedal pulses.   Brayton ElKevin Jiali Linney PA-C Interventional Radiology 06/17/2016 10:24 AM

## 2016-06-17 NOTE — Progress Notes (Signed)
Patient:   Perry Morrison   DOB:   11/30/1960  MR Number:  [id number]  [facility] A [address] [id number] [place] [address] Dept: [phone] Loc: [phone]           Date of Service:   06/17/2016   Start Time:   9:45 AM  End Time:   10:30 AM  Provider/Observer:  Hershal CoriaJohn R Michalle Rademaker PSYD       Billing Code/Service: [id number]  Chief Complaint:     Chief Complaint  Patient presents with  . Spine Injury  . Depression  . Stress    Reason for Service:  The patient was referred for a psychological/neuropsychological consultation.  The patient developed abdominal and back pain that progressed to leg weakness and loss of sensation.  He was admitted to the inpatient unit after ED encounter with initial considerations of Myelitis but now there are considerations of a spinal aneurism.  The patient had been a very active person and this sudden change in functioning has created great adjustment issues and fear about what the future holds from him.   The patient reports that he has a relative that has come to see him who had the same type of symptoms and she was hospitalized and recovered after 6 months.   Current Status:  The patient is dealing with paraplegia and medical interventions continue.  He is having some adjustment issues and is experiencing more symptoms of depression and anxiety.  Reliability of Information: Information from patient, staff, and chart review.  Behavioral Observation: Perry Morrison  presents as a 56 y.o.-year-old Right African American Male who appeared his stated age. his dress was Appropriate and he was Well Groomed and his manners were Appropriate to the situation.  his participation was indicative of Drowsy behaviors.  There were clear physical disabilities noted.  he displayed an appropriate level of cooperation and motivation.     Interactions:    Active  Appropriate  Attention:   within normal limits and attention span and concentration were age appropriate  Memory:   within normal limits; recent and remote memory intact  Visuo-spatial:  within normal limits  Speech (Volume):  normal  Speech:   normal; normal  Thought Process:  Coherent  Though Content:  WNL; No indications of hallucinations or delusions.  Orientation:   person, place, time/date and situation  Judgment:   Good  Planning:   Good  Affect:    Depressed  Mood:    Depressed  Insight:   Good  Intelligence:   high  Medical History:   Past Medical History:  Diagnosis Date  . AKI (acute kidney injury) (HCC)   . 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 Hypertension   . Myelitis (HCC)   . Right leg weakness   . Urinary retention         Facility-Administered Encounter Medications as of 06/17/2016  Medication  . acetaminophen (TYLENOL) tablet 650 mg   Or  . acetaminophen (TYLENOL) suppository 650 mg  . ALPRAZolam (XANAX) tablet 0.25 mg  . amLODipine (NORVASC) tablet 10 mg  . baclofen (LIORESAL) tablet 10 mg  . bethanechol (URECHOLINE) tablet 25 mg  . bisacodyl (DULCOLAX) suppository 10 mg  . HYDROcodone-acetaminophen (NORCO/VICODIN) 5-325 MG per tablet 1-2 tablet  . hydrocortisone (ANUSOL-HC) suppository 25 mg  . lactulose (CHRONULAC) 10 GM/15ML solution 30 g  . ondansetron (ZOFRAN) tablet 4 mg  Or  . ondansetron (ZOFRAN) injection 4 mg  . pantoprazole (PROTONIX) EC tablet 40 mg  . senna-docusate (Senokot-S) tablet 2 tablet  . sorbitol 70 % solution 30 mL  . valACYclovir (VALTREX) tablet 500 mg   Outpatient Encounter Prescriptions as of 06/17/2016  Medication Sig  . amLODipine (NORVASC) 10 MG tablet Take 1 tablet (10 mg total) by mouth daily.  . hydrocortisone (ANUSOL-HC) 25 MG suppository Place 1 suppository (25 mg total) rectally daily.  Marland Kitchen MAGNESIUM PO Take 1 tablet by mouth daily.  . Omega-3 Fatty Acids (OMEGA-3 PO)  Take 1 capsule by mouth daily.  . pantoprazole (PROTONIX) 40 MG tablet Take 1 tablet (40 mg total) by mouth daily.  . polyethylene glycol (MIRALAX / GLYCOLAX) packet Take 34 g by mouth 2 (two) times daily.  Marland Kitchen senna-docusate (SENOKOT-S) 8.6-50 MG tablet Take 1 tablet by mouth 2 (two) times daily.          Sexual History:   History  Sexual Activity  . Sexual activity: Not on file    Abuse/Trauma History: The patient has experienced a sudden and acute loss of function in his legs.  Psychiatric History:  No significant past psychiatric history.  Family Med/Psych History:  Family History  Problem Relation Age of Onset  . Hypertension Mother   . AAA (abdominal aortic aneurysm) Mother     Risk of Suicide/Violence: low The patient denies any suicidal ideations.  Impression/DX:  The patient is dealing with acute onset of paraplegia with etiological factors still being addessed.  He was a very active person playing tennis, pickleball, and working out at [employer] on a regular basis.  He is dealing with nearly total loss of function in his legs.  Disposition/Plan:  Worked on build and addressing adjustment issues.  Diagnosis:    Incomplete paraplegia (HCC)  Adjustment disorder with mixed anxiety and depressed mood  AVM (arteriovenous malformation) spine  Myelitis (HCC)         Electronically Signed   _______________________ Arley Phenix, Psy.D.

## 2016-06-18 ENCOUNTER — Inpatient Hospital Stay (HOSPITAL_COMMUNITY): Payer: Self-pay | Admitting: Physical Therapy

## 2016-06-18 ENCOUNTER — Inpatient Hospital Stay (HOSPITAL_COMMUNITY): Payer: Medicaid Other | Admitting: Occupational Therapy

## 2016-06-18 ENCOUNTER — Encounter (HOSPITAL_COMMUNITY): Payer: Self-pay | Admitting: Interventional Radiology

## 2016-06-18 NOTE — Anesthesia Postprocedure Evaluation (Addendum)
Anesthesia Post Note  Patient: Perry Morrison  Procedure(s) Performed: Procedure(s) (LRB): SPINAL ANGIOGRAM (N/A)  Patient location during evaluation: PACU Anesthesia Type: General Level of consciousness: awake and alert Pain management: pain level controlled Vital Signs Assessment: post-procedure vital signs reviewed and stable Respiratory status: spontaneous breathing, nonlabored ventilation, respiratory function stable and patient connected to nasal cannula oxygen Cardiovascular status: blood pressure returned to baseline and stable Postop Assessment: no signs of nausea or vomiting Anesthetic complications: no       Last Vitals:  Vitals:   06/18/16 0443 06/18/16 1300  BP: 123/72 (!) 143/83  Pulse: 85 (!) 101  Resp: 20 18  Temp: 36.8 C 36.7 C    Last Pain:  Vitals:   06/18/16 1300  TempSrc: Oral  PainSc:                  Shelton SilvasKevin D Trevell Pariseau

## 2016-06-18 NOTE — Progress Notes (Signed)
Social Work Patient ID: Perry Morrison, male   DOB: [birth year], 56 y.o.   MRN: [id number]   Met with pt yesterday to review team conference.  He is aware and agreeable with change in d/c date to 2/17.  Good understanding of findings from angiogram and need to repeat on Monday to complete imaging.  His discharge plan remains to go to his grandmother's home.  Per OT this morning, tx concerned that pt seems to continue to lose function and now concerned about reaching supervision w/c level goals.  Marlowe Shores, PA aware of these concerns.  Sharell Hilmer, LCSW

## 2016-06-18 NOTE — Progress Notes (Signed)
Physical Therapy Session Note  Patient Details  Name: Perry Morrison MRN: [id number] Date of Birth: 02/07/1961  Today's Date: 06/18/2016 PT Individual Time: 1100-1200 and 1300-1400 PT Individual Time Calculation (min): 60 min and 60 min (total 120 min)   Short Term Goals: Week 2:  PT Short Term Goal 1 (Week 2): Pt will be able to manage w/c parts to set up for transfers with supervision cues PT Short Term Goal 2 (Week 2): Pt will complete squat pivot transfers with supervision.  PT Short Term Goal 3 (Week 2): Pt will perform bed mobility with supervision.  Skilled Therapeutic Interventions/Progress Updates: Tx 1: Pt received supine in bed, denies pain and agreeable to treatment. Leg loops donned totalA for time/energy conservation. Supine>sit with leg loops and bedrails, S overall with increased time. Transfer bed>w/c minA with transfer board. Session focused on specialty w/c evaluation with Melrose NakayamaJoshua Cadle, ATP from [facility] present for session. Performed transfers w/c <>mat table minA with board, and sit <>supine minA with leg loops for active participation in evaluation. Pt with ankle dorsiflexion limitations noted at gastroc and soleus, RLE -6 degrees gastroc, -4 degrees soleus, LLE -3 degrees gastroc, +2 degrees soleus. Educated pt on various w/c options to improve energy conservation, function and independence. Remained seated in w/c at end of session, all needs in reach.   Tx 2: Pt received seated in recliner, denies pain and agreeable to treatment. Sit >stand modA from recliner in stedy to transfer to w/c. Car transfer performed minA for LE management, using leg loops. Requires significantly increased time for task d/t balance impairments, difficulty with LE management. Transfer w/c >mat table with min guard. Short sit>long sit with leg loops and minA. Performed BLE figure 4 LE stretching x2 min each side. Handoff to OT at end of session.      Therapy Documentation Precautions:   Precautions Precautions: Fall Precaution Comments: RLE hemiparesis Other Brace/Splint: PRAFO R foot Restrictions Weight Bearing Restrictions: No Pain: Pain Assessment Pain Score: 0-No pain   See Function Navigator for Current Functional Status.   Therapy/Group: Individual Therapy  [name] 06/18/2016, 11:58 AM

## 2016-06-18 NOTE — Progress Notes (Signed)
[facility]     PROGRESS NOTE    Subjective/Complaints:   Had some burning pain in his groin/pelvic region last night. Catheter bag was kinked---when unkinked it produced 1200cc urine  ROS: pt denies nausea, vomiting, diarrhea, cough, shortness of breath or chest pain        Objective: Vital Signs: Blood pressure 123/72, pulse 85, temperature 98.3 F (36.8 C), temperature source Oral, resp. rate 20, height 6\' 1"  (1.854 m), weight 98.4 kg (216 lb 14.9 oz), SpO2 100 %. Ir Aorta/thoracic  Result Date: 06/18/2016 INDICATION: Progressive worsening bilateral lower extremity paraparesis, right greater than left associated with paresthesias and pain. Abnormal MRI of the the lower thoracic spine. EXAM: BILATERAL COMMON CAROTID AND INNOMINATE ANGIOGRAPHY AND BILATERAL VERTEBRAL ARTERY ANGIOGRAMS. THORACIC AND LUMBAR AORTOGRAMS. COMPLETE SPINAL ANGIOGRAM OF THE CERVICAL, THORACIC AND LUMBAR REGIONS. MEDICATIONS: None. The antibiotic was administered within 1 hour of the procedure ANESTHESIA/SEDATION: General anesthesia. FLUOROSCOPY TIME:  Fluoroscopy Time: 61 minutes 18 seconds (10175 mGy). COMPLICATIONS: None immediate. TECHNIQUE: Informed written consent was obtained from the patient after a thorough discussion of the procedural risks, benefits and alternatives. All questions were addressed. Maximal Sterile Barrier Technique was utilized including caps, mask, sterile gowns, sterile gloves, sterile drape, hand hygiene and skin antiseptic. A timeout was performed prior to the initiation of the procedure. PROCEDURE: The right groin was prepped and draped in the usual sterile fashion. Thereafter using modified Seldinger technique, transfemoral access into the right common femoral artery was obtained without difficulty. Over a 0.035 inch guidewire, a 5 French Pinnacle sheath was inserted. Through this, and also over 0.035 inch guidewire, a 5 French JB1 catheter was advanced  to the aortic arch region and selectively positioned in the right common carotid artery, the right subclavian, the right vertebral artery, the left common carotid artery and the left subclavian artery. A 5 French pigtail catheter was then utilized to perform thoracic and lumbar using the injector device. A 5 [place] Mikaelsson diagnostic catheter was then utilized to cannulate and perform selective injections of the intercostal arteries and the lumbar arteries. FINDINGS: The right common carotid arteriogram demonstrates the right external carotid artery and its major branches to be widely patent. The right internal carotid artery at the bulb to the cranial skull base opacifies normally. The petrous, the cavernous and the supraclinoid segments demonstrate normal opacification. A right posterior communicating artery is seen opacifying the right posterior cerebral artery and the superior cerebellar artery distributions. Also noted is slightly contralateral posterior cerebral artery. The right middle cerebral artery and the right anterior cerebral artery opacify normally into the capillary and venous phases. The right subclavian arteriogram demonstrates the thyrocervical and the costocervical trunks to be normal in its branches without abnormally prominent blood vessels projecting medially into the spinal canal area. The right vertebral artery origin demonstrates mild stenosis. The vessel is seen to opacify to the cranial skull base. Normal opacification is seen of the right vertebrobasilar junction and the right posterior-inferior cerebellar artery. The opacified portion the basilar artery, the posterior cerebral arteries, the superior cerebellar arteries and the anterior-inferior cerebellar arteries is normal into the delayed arterial phase. Non-opacified blood is seen in the basilar artery from the contralateral vertebral artery. The left vertebral artery origin is normal. The vessel is seen to opacify normally to the  cranial skull base. Normal opacification is seen of the left vertebrobasilar junction and the left posterior inferior cerebellar artery distribution. The basilar artery, the posterior cerebral arteries, the  superior cerebellar arteries and the anterior-inferior cerebellar arteries opacify normally into the capillary and venous phases. The left subclavian arteriogram demonstrates the costocervical and thyrocervical trunk branches to be normally opacified. The left common carotid arteriogram demonstrates the left external carotid artery and its major branches to be widely patent. The left internal carotid artery at the bulb to the cranial skull base opacifies normally. The petrous, cavernous and the supraclinoid segments are widely patent. The left middle cerebral artery and the left anterior cerebral artery opacify normally into the capillary and venous phases. A left posterior communicating artery is seen opacifying the left posterior cerebral artery distribution. Constant blush is noted in the region of the middle and the inferior terminus of the left maxillary sinus probably related to inflammation. Opacification of the left superior ophthalmic vein is noted. AORTOGRAMS OF THE THORACIC AND THE ABDOMINAL AORTA The thoracic aorta demonstrates normal opacification from the level of the distal portion of the aortic arch to the level of T12. The visualized intercostal branches from the superior intercostal artery to the level of T12 appear normal in caliber. No abnormal medially directed prominent vasculature is noted. No prominent venous structures are seen on the delayed arterial phase. The abdominal aortogram itself demonstrates normal opacification of the celiac trunk, the renal vasculature bilaterally, and the lower lumbar arteries. The bifurcation of the abdominal aorta into the common iliac arteries appears widely patent. Symmetrical lumbar arteries are noted bilaterally at the level from L3 to L5. Normal renal  blush is noted with clearance of contrast. The visualized superior mesenteric artery and the inferior mesenteric arteries also opacify normally. SPINAL ANGIOGRAM Selective cannulations and angiographic findings were obtained as mentioned below. The right superior intercostal branch is noted arising at the level of T5. This is seen to extend superiorly and laterally to the level of T2 and T3 intercostal branches. Opacification of the bronchial vasculature is also noted from this artery. The selective T5 intercostal injection demonstrates prompt opacification of the muscular and the anterior spinal branches projecting medially. Also noted is anastomosis with the right T4 intercostal branch. There is flash opacification of the left T5 intercostal artery also. Again no abnormal midline shunting or delayed opacification is noted. Also noted is opacification of the T6 intercostal artery on the right. The right T7 intercostal arterial branches demonstrate normal opacification of the muscular branches. The spinal branch also appears normal in caliber. A midline vessel structure is noted most likely representing a portion of the anterior spinal artery. The left T7 intercostal injection demonstrates the spinal branch and the lateral muscular branches to be widely patent. The delayed arterial phase demonstrates a midline structure in the center projecting superiorly and inferiorly. This represents the anterior spinal artery. Again there is no early opacification or shunting or of a delayed venous structure noted. The right T8 intercostal injection demonstrates normal opacification of the spinal branch and the lateral muscular branches. The left T8 intercostal artery also demonstrates normal opacification of the muscular and also of the medially positioned spinal branch. The delayed arterial phase demonstrates a wisp of anterior spinal artery. The right T10 intercostal injection demonstrates normal opacification of the muscular  and the anterior spinal branches. The left T10 intercostal injection demonstrates normal opacification of the muscular branches and of the spinal branches. Projecting medially and arising superiorly is a hairpin turn of vascular structure in the midline and then projecting caudally. This is most consistent with the artery of Adamkiewicz. No arteriovenous shunting of contrast is noted. There  is no delayed presence of a vascular structure. Arising just caudal to this midline vascular structure is another vascular structure rising caudal to the one above. This also projects medially and then appears to make an upward turn. This probably represents the anterior spinal artery also. The dominant hairpin structure was followed caudally to the thoracolumbar region. However, its distal opacification is poorly defined in view of the overlying extensive gas and fecal material in the bowel. The right T11 intercostal injection demonstrates normal opacification of the muscular and the anterior spinal branches. No angiographic evidence of shunting of arteriovenous blood or of a delayed filling of a venous structure is noted in the midline or paramidline region. The left T11 intercostal artery demonstrates normal opacification of the muscular in the anterior spinal branches. There is also filling of the T12 left intercostal. The injection of the T11 left intercostal again demonstrates the presence of yet another hairpin turn or loop of a vascular structure projecting cranially and then making a turn caudally in the midline. Again this is in the midline and is most consistent with an accessory or a duplicated artery of Adamkiewicz. The right T12 intercostal injection demonstrates normal opacification of the muscular in the anterior spinal branches. Again no evidence of arteriovenous shunting or of a delayed midline vessel structure is noted. The left T12 intercostal injection demonstrates normal opacification of the muscular and the  anterior spinal branches. Inferiorly projecting anastomoses are seen with the left lumbar branches. Selective cannulation and arteriograms were performed of the right and left L1 lumbar arteries, the left L2 and transiently the right L2 arteries, the right and left L3 arteries, and the right and left L4 lumbar arteries. These demonstrate normal caliber and flow without evidence of arteriovenous shunting or delayed midline venous structures. Selective angiograms were also performed of the common iliacs which demonstrate no abnormally prominent vascular structures projecting cranially from either side. 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 Caudal extension of the artery of Adamkiewicz arising at T10 from the left intercostal artery followed to the thoracolumbar region. However, overlying gas and fecal material within the intestines precluding further visualization. Findings were discussed with the referring neurologist. Electronically Signed   By: Julieanne Cotton M.D.   On: 06/17/2016 20:22   Ir Aortagram Abdominal Serialogram  Result Date: 06/18/2016 INDICATION: Progressive worsening bilateral lower extremity paraparesis, right greater than left associated with paresthesias and pain. Abnormal MRI of the the lower thoracic spine. EXAM: BILATERAL COMMON CAROTID AND INNOMINATE ANGIOGRAPHY AND BILATERAL VERTEBRAL ARTERY ANGIOGRAMS. THORACIC AND LUMBAR AORTOGRAMS. COMPLETE SPINAL ANGIOGRAM OF THE CERVICAL, THORACIC AND LUMBAR REGIONS. MEDICATIONS: None. The antibiotic was administered within 1 hour of the procedure ANESTHESIA/SEDATION: General anesthesia. FLUOROSCOPY TIME:  Fluoroscopy Time: 61 minutes 18 seconds (91478 mGy). COMPLICATIONS: None immediate. TECHNIQUE: Informed  written consent was obtained from the patient after a thorough discussion of the procedural risks, benefits and alternatives. All questions were addressed. Maximal Sterile Barrier Technique was utilized including caps, mask, sterile gowns, sterile gloves, sterile drape, hand hygiene and skin antiseptic. A timeout was performed prior to the initiation of the procedure. PROCEDURE: The right groin was prepped and draped in the usual sterile fashion. Thereafter using modified Seldinger technique, transfemoral access into the right common femoral artery was obtained without difficulty. Over a 0.035 inch guidewire, a 5 [place]  Pinnacle sheath was inserted. Through this, and also over 0.035 inch guidewire, a 5 French JB1 catheter was advanced to the aortic arch region and selectively positioned in the right common carotid artery, the right subclavian, the right vertebral artery, the left common carotid artery and the left subclavian artery. A 5 French pigtail catheter was then utilized to perform thoracic and lumbar using the injector device. A 5 [place] Mikaelsson diagnostic catheter was then utilized to cannulate and perform selective injections of the intercostal arteries and the lumbar arteries. FINDINGS: The right common carotid arteriogram demonstrates the right external carotid artery and its major branches to be widely patent. The right internal carotid artery at the bulb to the cranial skull base opacifies normally. The petrous, the cavernous and the supraclinoid segments demonstrate normal opacification. A right posterior communicating artery is seen opacifying the right posterior cerebral artery and the superior cerebellar artery distributions. Also noted is slightly contralateral posterior cerebral artery. The right middle cerebral artery and the right anterior cerebral artery opacify normally into the capillary and venous phases. The right subclavian arteriogram demonstrates the thyrocervical and the  costocervical trunks to be normal in its branches without abnormally prominent blood vessels projecting medially into the spinal canal area. The right vertebral artery origin demonstrates mild stenosis. The vessel is seen to opacify to the cranial skull base. Normal opacification is seen of the right vertebrobasilar junction and the right posterior-inferior cerebellar artery. The opacified portion the basilar artery, the posterior cerebral arteries, the superior cerebellar arteries and the anterior-inferior cerebellar arteries is normal into the delayed arterial phase. Non-opacified blood is seen in the basilar artery from the contralateral vertebral artery. The left vertebral artery origin is normal. The vessel is seen to opacify normally to the cranial skull base. Normal opacification is seen of the left vertebrobasilar junction and the left posterior inferior cerebellar artery distribution. The basilar artery, the posterior cerebral arteries, the superior cerebellar arteries and the anterior-inferior cerebellar arteries opacify normally into the capillary and venous phases. The left subclavian arteriogram demonstrates the costocervical and thyrocervical trunk branches to be normally opacified. The left common carotid arteriogram demonstrates the left external carotid artery and its major branches to be widely patent. The left internal carotid artery at the bulb to the cranial skull base opacifies normally. The petrous, cavernous and the supraclinoid segments are widely patent. The left middle cerebral artery and the left anterior cerebral artery opacify normally into the capillary and venous phases. A left posterior communicating artery is seen opacifying the left posterior cerebral artery distribution. Constant blush is noted in the region of the middle and the inferior terminus of the left maxillary sinus probably related to inflammation. Opacification of the left superior ophthalmic vein is noted. AORTOGRAMS  OF THE THORACIC AND THE ABDOMINAL AORTA The thoracic aorta demonstrates normal opacification from the level of the distal portion of the aortic arch to the level of T12. The visualized intercostal branches from the superior intercostal artery to the level of T12 appear normal in caliber. No abnormal medially directed prominent vasculature is noted. No prominent venous structures are seen on the delayed arterial phase. The abdominal aortogram itself demonstrates normal opacification of the celiac trunk, the renal vasculature bilaterally, and the lower lumbar arteries. The bifurcation of the abdominal aorta into the common iliac arteries appears widely patent. Symmetrical lumbar arteries are noted bilaterally at the level from L3 to L5. Normal renal blush is noted with clearance of contrast. The visualized superior mesenteric artery and the  inferior mesenteric arteries also opacify normally. SPINAL ANGIOGRAM Selective cannulations and angiographic findings were obtained as mentioned below. The right superior intercostal branch is noted arising at the level of T5. This is seen to extend superiorly and laterally to the level of T2 and T3 intercostal branches. Opacification of the bronchial vasculature is also noted from this artery. The selective T5 intercostal injection demonstrates prompt opacification of the muscular and the anterior spinal branches projecting medially. Also noted is anastomosis with the right T4 intercostal branch. There is flash opacification of the left T5 intercostal artery also. Again no abnormal midline shunting or delayed opacification is noted. Also noted is opacification of the T6 intercostal artery on the right. The right T7 intercostal arterial branches demonstrate normal opacification of the muscular branches. The spinal branch also appears normal in caliber. A midline vessel structure is noted most likely representing a portion of the anterior spinal artery. The left T7 intercostal  injection demonstrates the spinal branch and the lateral muscular branches to be widely patent. The delayed arterial phase demonstrates a midline structure in the center projecting superiorly and inferiorly. This represents the anterior spinal artery. Again there is no early opacification or shunting or of a delayed venous structure noted. The right T8 intercostal injection demonstrates normal opacification of the spinal branch and the lateral muscular branches. The left T8 intercostal artery also demonstrates normal opacification of the muscular and also of the medially positioned spinal branch. The delayed arterial phase demonstrates a wisp of anterior spinal artery. The right T10 intercostal injection demonstrates normal opacification of the muscular and the anterior spinal branches. The left T10 intercostal injection demonstrates normal opacification of the muscular branches and of the spinal branches. Projecting medially and arising superiorly is a hairpin turn of vascular structure in the midline and then projecting caudally. This is most consistent with the artery of Adamkiewicz. No arteriovenous shunting of contrast is noted. There is no delayed presence of a vascular structure. Arising just caudal to this midline vascular structure is another vascular structure rising caudal to the one above. This also projects medially and then appears to make an upward turn. This probably represents the anterior spinal artery also. The dominant hairpin structure was followed caudally to the thoracolumbar region. However, its distal opacification is poorly defined in view of the overlying extensive gas and fecal material in the bowel. The right T11 intercostal injection demonstrates normal opacification of the muscular and the anterior spinal branches. No angiographic evidence of shunting of arteriovenous blood or of a delayed filling of a venous structure is noted in the midline or paramidline region. The left T11  intercostal artery demonstrates normal opacification of the muscular in the anterior spinal branches. There is also filling of the T12 left intercostal. The injection of the T11 left intercostal again demonstrates the presence of yet another hairpin turn or loop of a vascular structure projecting cranially and then making a turn caudally in the midline. Again this is in the midline and is most consistent with an accessory or a duplicated artery of Adamkiewicz. The right T12 intercostal injection demonstrates normal opacification of the muscular in the anterior spinal branches. Again no evidence of arteriovenous shunting or of a delayed midline vessel structure is noted. The left T12 intercostal injection demonstrates normal opacification of the muscular and the anterior spinal branches. Inferiorly projecting anastomoses are seen with the left lumbar branches. Selective cannulation and arteriograms were performed of the right and left L1 lumbar arteries, the left L2 and  transiently the right L2 arteries, the right and left L3 arteries, and the right and left L4 lumbar arteries. These demonstrate normal caliber and flow without evidence of arteriovenous shunting or delayed midline venous structures. Selective angiograms were also performed of the common iliacs which demonstrate no abnormally prominent vascular structures projecting cranially from either side. 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 Caudal extension of the artery of Adamkiewicz arising at T10 from the left intercostal artery followed to the thoracolumbar region. However, overlying gas and fecal material within the intestines precluding further visualization. Findings were discussed with the referring neurologist.  Electronically Signed   By: Julieanne Cotton M.D.   On: 06/17/2016 20:22   Ir Angio/spinal Left  Result Date: 06/18/2016 INDICATION: Progressive worsening bilateral lower extremity paraparesis, right greater than left associated with paresthesias and pain. Abnormal MRI of the the lower thoracic spine. EXAM: BILATERAL COMMON CAROTID AND INNOMINATE ANGIOGRAPHY AND BILATERAL VERTEBRAL ARTERY ANGIOGRAMS. THORACIC AND LUMBAR AORTOGRAMS. COMPLETE SPINAL ANGIOGRAM OF THE CERVICAL, THORACIC AND LUMBAR REGIONS. MEDICATIONS: None. The antibiotic was administered within 1 hour of the procedure ANESTHESIA/SEDATION: General anesthesia. FLUOROSCOPY TIME:  Fluoroscopy Time: 61 minutes 18 seconds (56213 mGy). COMPLICATIONS: None immediate. TECHNIQUE: Informed written consent was obtained from the patient after a thorough discussion of the procedural risks, benefits and alternatives. All questions were addressed. Maximal Sterile Barrier Technique was utilized including caps, mask, sterile gowns, sterile gloves, sterile drape, hand hygiene and skin antiseptic. A timeout was performed prior to the initiation of the procedure. PROCEDURE: The right groin was prepped and draped in the usual sterile fashion. Thereafter using modified Seldinger technique, transfemoral access into the right common femoral artery was obtained without difficulty. Over a 0.035 inch guidewire, a 5 French Pinnacle sheath was inserted. Through this, and also over 0.035 inch guidewire, a 5 French JB1 catheter was advanced to the aortic arch region and selectively positioned in the right common carotid artery, the right subclavian, the right vertebral artery, the left common carotid artery and the left subclavian artery. A 5 French pigtail catheter was then utilized to perform thoracic and lumbar using the injector device. A 5 [place] Mikaelsson diagnostic catheter was then utilized to cannulate and perform selective injections of the intercostal arteries and  the lumbar arteries. FINDINGS: The right common carotid arteriogram demonstrates the right external carotid artery and its major branches to be widely patent. The right internal carotid artery at the bulb to the cranial skull base opacifies normally. The petrous, the cavernous and the supraclinoid segments demonstrate normal opacification. A right posterior communicating artery is seen opacifying the right posterior cerebral artery and the superior cerebellar artery distributions. Also noted is slightly contralateral posterior cerebral artery. The right middle cerebral artery and the right anterior cerebral artery opacify normally into the capillary and venous phases. The right subclavian arteriogram demonstrates the thyrocervical and the costocervical trunks to be normal in its branches without abnormally prominent blood vessels projecting medially into the spinal canal area. The right vertebral artery origin demonstrates mild stenosis. The vessel is seen to opacify to the cranial skull base. Normal opacification is seen of the right vertebrobasilar junction and the right posterior-inferior cerebellar artery. The opacified portion the basilar artery, the posterior cerebral arteries, the superior cerebellar arteries and the anterior-inferior cerebellar arteries is normal into the delayed arterial phase.  Non-opacified blood is seen in the basilar artery from the contralateral vertebral artery. The left vertebral artery origin is normal. The vessel is seen to opacify normally to the cranial skull base. Normal opacification is seen of the left vertebrobasilar junction and the left posterior inferior cerebellar artery distribution. The basilar artery, the posterior cerebral arteries, the superior cerebellar arteries and the anterior-inferior cerebellar arteries opacify normally into the capillary and venous phases. The left subclavian arteriogram demonstrates the costocervical and thyrocervical trunk branches to be  normally opacified. The left common carotid arteriogram demonstrates the left external carotid artery and its major branches to be widely patent. The left internal carotid artery at the bulb to the cranial skull base opacifies normally. The petrous, cavernous and the supraclinoid segments are widely patent. The left middle cerebral artery and the left anterior cerebral artery opacify normally into the capillary and venous phases. A left posterior communicating artery is seen opacifying the left posterior cerebral artery distribution. Constant blush is noted in the region of the middle and the inferior terminus of the left maxillary sinus probably related to inflammation. Opacification of the left superior ophthalmic vein is noted. AORTOGRAMS OF THE THORACIC AND THE ABDOMINAL AORTA The thoracic aorta demonstrates normal opacification from the level of the distal portion of the aortic arch to the level of T12. The visualized intercostal branches from the superior intercostal artery to the level of T12 appear normal in caliber. No abnormal medially directed prominent vasculature is noted. No prominent venous structures are seen on the delayed arterial phase. The abdominal aortogram itself demonstrates normal opacification of the celiac trunk, the renal vasculature bilaterally, and the lower lumbar arteries. The bifurcation of the abdominal aorta into the common iliac arteries appears widely patent. Symmetrical lumbar arteries are noted bilaterally at the level from L3 to L5. Normal renal blush is noted with clearance of contrast. The visualized superior mesenteric artery and the inferior mesenteric arteries also opacify normally. SPINAL ANGIOGRAM Selective cannulations and angiographic findings were obtained as mentioned below. The right superior intercostal branch is noted arising at the level of T5. This is seen to extend superiorly and laterally to the level of T2 and T3 intercostal branches. Opacification of the  bronchial vasculature is also noted from this artery. The selective T5 intercostal injection demonstrates prompt opacification of the muscular and the anterior spinal branches projecting medially. Also noted is anastomosis with the right T4 intercostal branch. There is flash opacification of the left T5 intercostal artery also. Again no abnormal midline shunting or delayed opacification is noted. Also noted is opacification of the T6 intercostal artery on the right. The right T7 intercostal arterial branches demonstrate normal opacification of the muscular branches. The spinal branch also appears normal in caliber. A midline vessel structure is noted most likely representing a portion of the anterior spinal artery. The left T7 intercostal injection demonstrates the spinal branch and the lateral muscular branches to be widely patent. The delayed arterial phase demonstrates a midline structure in the center projecting superiorly and inferiorly. This represents the anterior spinal artery. Again there is no early opacification or shunting or of a delayed venous structure noted. The right T8 intercostal injection demonstrates normal opacification of the spinal branch and the lateral muscular branches. The left T8 intercostal artery also demonstrates normal opacification of the muscular and also of the medially positioned spinal branch. The delayed arterial phase demonstrates a wisp of anterior spinal artery. The right T10 intercostal injection demonstrates normal opacification of the muscular and  the anterior spinal branches. The left T10 intercostal injection demonstrates normal opacification of the muscular branches and of the spinal branches. Projecting medially and arising superiorly is a hairpin turn of vascular structure in the midline and then projecting caudally. This is most consistent with the artery of Adamkiewicz. No arteriovenous shunting of contrast is noted. There is no delayed presence of a vascular  structure. Arising just caudal to this midline vascular structure is another vascular structure rising caudal to the one above. This also projects medially and then appears to make an upward turn. This probably represents the anterior spinal artery also. The dominant hairpin structure was followed caudally to the thoracolumbar region. However, its distal opacification is poorly defined in view of the overlying extensive gas and fecal material in the bowel. The right T11 intercostal injection demonstrates normal opacification of the muscular and the anterior spinal branches. No angiographic evidence of shunting of arteriovenous blood or of a delayed filling of a venous structure is noted in the midline or paramidline region. The left T11 intercostal artery demonstrates normal opacification of the muscular in the anterior spinal branches. There is also filling of the T12 left intercostal. The injection of the T11 left intercostal again demonstrates the presence of yet another hairpin turn or loop of a vascular structure projecting cranially and then making a turn caudally in the midline. Again this is in the midline and is most consistent with an accessory or a duplicated artery of Adamkiewicz. The right T12 intercostal injection demonstrates normal opacification of the muscular in the anterior spinal branches. Again no evidence of arteriovenous shunting or of a delayed midline vessel structure is noted. The left T12 intercostal injection demonstrates normal opacification of the muscular and the anterior spinal branches. Inferiorly projecting anastomoses are seen with the left lumbar branches. Selective cannulation and arteriograms were performed of the right and left L1 lumbar arteries, the left L2 and transiently the right L2 arteries, the right and left L3 arteries, and the right and left L4 lumbar arteries. These demonstrate normal caliber and flow without evidence of arteriovenous shunting or delayed midline  venous structures. Selective angiograms were also performed of the common iliacs which demonstrate no abnormally prominent vascular structures projecting cranially from either side. 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 Caudal extension of the artery of Adamkiewicz arising at T10 from the left intercostal artery followed to the thoracolumbar region. However, overlying gas and fecal material within the intestines precluding further visualization. Findings were discussed with the referring neurologist. Electronically Signed   By: Julieanne Cotton M.D.   On: 06/17/2016 20:22   Ir Angio/spinal Left  Result Date: 06/18/2016 INDICATION: Progressive worsening bilateral lower extremity paraparesis, right greater than left associated with paresthesias and pain. Abnormal MRI of the the lower thoracic spine. EXAM: BILATERAL COMMON CAROTID AND INNOMINATE ANGIOGRAPHY AND BILATERAL VERTEBRAL ARTERY ANGIOGRAMS. THORACIC AND LUMBAR AORTOGRAMS. COMPLETE SPINAL ANGIOGRAM OF THE CERVICAL, THORACIC AND LUMBAR REGIONS. MEDICATIONS: None. The antibiotic was administered within 1 hour of the procedure ANESTHESIA/SEDATION: General anesthesia. FLUOROSCOPY TIME:  Fluoroscopy Time: 61 minutes 18 seconds (16109 mGy). COMPLICATIONS: None immediate. TECHNIQUE: Informed written consent was obtained from the patient after a thorough discussion of the procedural risks, benefits and alternatives. All questions were addressed. Maximal Sterile Barrier Technique was utilized including caps, mask, sterile gowns, sterile gloves,  sterile drape, hand hygiene and skin antiseptic. A timeout was performed prior to the initiation of the procedure. PROCEDURE: The right groin was prepped and draped in the usual  sterile fashion. Thereafter using modified Seldinger technique, transfemoral access into the right common femoral artery was obtained without difficulty. Over a 0.035 inch guidewire, a 5 French Pinnacle sheath was inserted. Through this, and also over 0.035 inch guidewire, a 5 French JB1 catheter was advanced to the aortic arch region and selectively positioned in the right common carotid artery, the right subclavian, the right vertebral artery, the left common carotid artery and the left subclavian artery. A 5 French pigtail catheter was then utilized to perform thoracic and lumbar using the injector device. A 5 [place] Mikaelsson diagnostic catheter was then utilized to cannulate and perform selective injections of the intercostal arteries and the lumbar arteries. FINDINGS: The right common carotid arteriogram demonstrates the right external carotid artery and its major branches to be widely patent. The right internal carotid artery at the bulb to the cranial skull base opacifies normally. The petrous, the cavernous and the supraclinoid segments demonstrate normal opacification. A right posterior communicating artery is seen opacifying the right posterior cerebral artery and the superior cerebellar artery distributions. Also noted is slightly contralateral posterior cerebral artery. The right middle cerebral artery and the right anterior cerebral artery opacify normally into the capillary and venous phases. The right subclavian arteriogram demonstrates the thyrocervical and the costocervical trunks to be normal in its branches without abnormally prominent blood vessels projecting medially into the spinal canal area. The right vertebral artery origin demonstrates mild stenosis. The vessel is seen to opacify to the cranial skull base. Normal opacification is seen of the right vertebrobasilar junction and the right posterior-inferior cerebellar artery. The opacified portion the basilar artery, the posterior cerebral  arteries, the superior cerebellar arteries and the anterior-inferior cerebellar arteries is normal into the delayed arterial phase. Non-opacified blood is seen in the basilar artery from the contralateral vertebral artery. The left vertebral artery origin is normal. The vessel is seen to opacify normally to the cranial skull base. Normal opacification is seen of the left vertebrobasilar junction and the left posterior inferior cerebellar artery distribution. The basilar artery, the posterior cerebral arteries, the superior cerebellar arteries and the anterior-inferior cerebellar arteries opacify normally into the capillary and venous phases. The left subclavian arteriogram demonstrates the costocervical and thyrocervical trunk branches to be normally opacified. The left common carotid arteriogram demonstrates the left external carotid artery and its major branches to be widely patent. The left internal carotid artery at the bulb to the cranial skull base opacifies normally. The petrous, cavernous and the supraclinoid segments are widely patent. The left middle cerebral artery and the left anterior cerebral artery opacify normally into the capillary and venous phases. A left posterior communicating artery is seen opacifying the left posterior cerebral artery distribution. Constant blush is noted in the region of the middle and the inferior terminus of the left maxillary sinus probably related to inflammation. Opacification of the left superior ophthalmic vein is noted. AORTOGRAMS OF THE THORACIC AND THE ABDOMINAL AORTA The thoracic aorta demonstrates normal opacification from the level of the distal portion of the aortic arch to the level of T12. The visualized intercostal branches from the superior intercostal artery to the level of T12 appear normal in caliber. No abnormal medially directed prominent vasculature is noted. No prominent venous structures are seen on the delayed arterial phase. The abdominal aortogram  itself demonstrates normal  opacification of the celiac trunk, the renal vasculature bilaterally, and the lower lumbar arteries. The bifurcation of the abdominal aorta into the common iliac arteries appears widely patent. Symmetrical lumbar arteries are noted bilaterally at the level from L3 to L5. Normal renal blush is noted with clearance of contrast. The visualized superior mesenteric artery and the inferior mesenteric arteries also opacify normally. SPINAL ANGIOGRAM Selective cannulations and angiographic findings were obtained as mentioned below. The right superior intercostal branch is noted arising at the level of T5. This is seen to extend superiorly and laterally to the level of T2 and T3 intercostal branches. Opacification of the bronchial vasculature is also noted from this artery. The selective T5 intercostal injection demonstrates prompt opacification of the muscular and the anterior spinal branches projecting medially. Also noted is anastomosis with the right T4 intercostal branch. There is flash opacification of the left T5 intercostal artery also. Again no abnormal midline shunting or delayed opacification is noted. Also noted is opacification of the T6 intercostal artery on the right. The right T7 intercostal arterial branches demonstrate normal opacification of the muscular branches. The spinal branch also appears normal in caliber. A midline vessel structure is noted most likely representing a portion of the anterior spinal artery. The left T7 intercostal injection demonstrates the spinal branch and the lateral muscular branches to be widely patent. The delayed arterial phase demonstrates a midline structure in the center projecting superiorly and inferiorly. This represents the anterior spinal artery. Again there is no early opacification or shunting or of a delayed venous structure noted. The right T8 intercostal injection demonstrates normal opacification of the spinal branch and the lateral  muscular branches. The left T8 intercostal artery also demonstrates normal opacification of the muscular and also of the medially positioned spinal branch. The delayed arterial phase demonstrates a wisp of anterior spinal artery. The right T10 intercostal injection demonstrates normal opacification of the muscular and the anterior spinal branches. The left T10 intercostal injection demonstrates normal opacification of the muscular branches and of the spinal branches. Projecting medially and arising superiorly is a hairpin turn of vascular structure in the midline and then projecting caudally. This is most consistent with the artery of Adamkiewicz. No arteriovenous shunting of contrast is noted. There is no delayed presence of a vascular structure. Arising just caudal to this midline vascular structure is another vascular structure rising caudal to the one above. This also projects medially and then appears to make an upward turn. This probably represents the anterior spinal artery also. The dominant hairpin structure was followed caudally to the thoracolumbar region. However, its distal opacification is poorly defined in view of the overlying extensive gas and fecal material in the bowel. The right T11 intercostal injection demonstrates normal opacification of the muscular and the anterior spinal branches. No angiographic evidence of shunting of arteriovenous blood or of a delayed filling of a venous structure is noted in the midline or paramidline region. The left T11 intercostal artery demonstrates normal opacification of the muscular in the anterior spinal branches. There is also filling of the T12 left intercostal. The injection of the T11 left intercostal again demonstrates the presence of yet another hairpin turn or loop of a vascular structure projecting cranially and then making a turn caudally in the midline. Again this is in the midline and is most consistent with an accessory or a duplicated artery of  Adamkiewicz. The right T12 intercostal injection demonstrates normal opacification of the muscular in the anterior spinal branches. Again no  evidence of arteriovenous shunting or of a delayed midline vessel structure is noted. The left T12 intercostal injection demonstrates normal opacification of the muscular and the anterior spinal branches. Inferiorly projecting anastomoses are seen with the left lumbar branches. Selective cannulation and arteriograms were performed of the right and left L1 lumbar arteries, the left L2 and transiently the right L2 arteries, the right and left L3 arteries, and the right and left L4 lumbar arteries. These demonstrate normal caliber and flow without evidence of arteriovenous shunting or delayed midline venous structures. Selective angiograms were also performed of the common iliacs which demonstrate no abnormally prominent vascular structures projecting cranially from either side. 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 Caudal extension of the artery of Adamkiewicz arising at T10 from the left intercostal artery followed to the thoracolumbar region. However, overlying gas and fecal material within the intestines precluding further visualization. Findings were discussed with the referring neurologist. Electronically Signed   By: Julieanne Cotton M.D.   On: 06/17/2016 20:22   Ir Angio/spinal Left  Result Date: 06/18/2016 INDICATION: Progressive worsening bilateral lower extremity paraparesis, right greater than left associated with paresthesias and pain. Abnormal MRI of the the lower thoracic spine. EXAM: BILATERAL COMMON CAROTID AND INNOMINATE ANGIOGRAPHY AND BILATERAL VERTEBRAL ARTERY ANGIOGRAMS. THORACIC AND LUMBAR AORTOGRAMS. COMPLETE  SPINAL ANGIOGRAM OF THE CERVICAL, THORACIC AND LUMBAR REGIONS. MEDICATIONS: None. The antibiotic was administered within 1 hour of the procedure ANESTHESIA/SEDATION: General anesthesia. FLUOROSCOPY TIME:  Fluoroscopy Time: 61 minutes 18 seconds (62130 mGy). COMPLICATIONS: None immediate. TECHNIQUE: Informed written consent was obtained from the patient after a thorough discussion of the procedural risks, benefits and alternatives. All questions were addressed. Maximal Sterile Barrier Technique was utilized including caps, mask, sterile gowns, sterile gloves, sterile drape, hand hygiene and skin antiseptic. A timeout was performed prior to the initiation of the procedure. PROCEDURE: The right groin was prepped and draped in the usual sterile fashion. Thereafter using modified Seldinger technique, transfemoral access into the right common femoral artery was obtained without difficulty. Over a 0.035 inch guidewire, a 5 French Pinnacle sheath was inserted. Through this, and also over 0.035 inch guidewire, a 5 French JB1 catheter was advanced to the aortic arch region and selectively positioned in the right common carotid artery, the right subclavian, the right vertebral artery, the left common carotid artery and the left subclavian artery. A 5 French pigtail catheter was then utilized to perform thoracic and lumbar using the injector device. A 5 [place] Mikaelsson diagnostic catheter was then utilized to cannulate and perform selective injections of the intercostal arteries and the lumbar arteries. FINDINGS: The right common carotid arteriogram demonstrates the right external carotid artery and its major branches to be widely patent. The right internal carotid artery at the bulb to the cranial skull base opacifies normally. The petrous, the cavernous and the supraclinoid segments demonstrate normal opacification. A right posterior communicating artery is seen opacifying the right posterior cerebral artery and the  superior cerebellar artery distributions. Also noted is slightly contralateral posterior cerebral artery. The right middle cerebral artery and the right anterior cerebral artery opacify normally into the capillary and venous phases. The right subclavian arteriogram demonstrates the thyrocervical and the costocervical trunks to be normal in its branches without abnormally prominent blood vessels projecting medially into the spinal canal area. The right vertebral  artery origin demonstrates mild stenosis. The vessel is seen to opacify to the cranial skull base. Normal opacification is seen of the right vertebrobasilar junction and the right posterior-inferior cerebellar artery. The opacified portion the basilar artery, the posterior cerebral arteries, the superior cerebellar arteries and the anterior-inferior cerebellar arteries is normal into the delayed arterial phase. Non-opacified blood is seen in the basilar artery from the contralateral vertebral artery. The left vertebral artery origin is normal. The vessel is seen to opacify normally to the cranial skull base. Normal opacification is seen of the left vertebrobasilar junction and the left posterior inferior cerebellar artery distribution. The basilar artery, the posterior cerebral arteries, the superior cerebellar arteries and the anterior-inferior cerebellar arteries opacify normally into the capillary and venous phases. The left subclavian arteriogram demonstrates the costocervical and thyrocervical trunk branches to be normally opacified. The left common carotid arteriogram demonstrates the left external carotid artery and its major branches to be widely patent. The left internal carotid artery at the bulb to the cranial skull base opacifies normally. The petrous, cavernous and the supraclinoid segments are widely patent. The left middle cerebral artery and the left anterior cerebral artery opacify normally into the capillary and venous phases. A left  posterior communicating artery is seen opacifying the left posterior cerebral artery distribution. Constant blush is noted in the region of the middle and the inferior terminus of the left maxillary sinus probably related to inflammation. Opacification of the left superior ophthalmic vein is noted. AORTOGRAMS OF THE THORACIC AND THE ABDOMINAL AORTA The thoracic aorta demonstrates normal opacification from the level of the distal portion of the aortic arch to the level of T12. The visualized intercostal branches from the superior intercostal artery to the level of T12 appear normal in caliber. No abnormal medially directed prominent vasculature is noted. No prominent venous structures are seen on the delayed arterial phase. The abdominal aortogram itself demonstrates normal opacification of the celiac trunk, the renal vasculature bilaterally, and the lower lumbar arteries. The bifurcation of the abdominal aorta into the common iliac arteries appears widely patent. Symmetrical lumbar arteries are noted bilaterally at the level from L3 to L5. Normal renal blush is noted with clearance of contrast. The visualized superior mesenteric artery and the inferior mesenteric arteries also opacify normally. SPINAL ANGIOGRAM Selective cannulations and angiographic findings were obtained as mentioned below. The right superior intercostal branch is noted arising at the level of T5. This is seen to extend superiorly and laterally to the level of T2 and T3 intercostal branches. Opacification of the bronchial vasculature is also noted from this artery. The selective T5 intercostal injection demonstrates prompt opacification of the muscular and the anterior spinal branches projecting medially. Also noted is anastomosis with the right T4 intercostal branch. There is flash opacification of the left T5 intercostal artery also. Again no abnormal midline shunting or delayed opacification is noted. Also noted is opacification of the T6  intercostal artery on the right. The right T7 intercostal arterial branches demonstrate normal opacification of the muscular branches. The spinal branch also appears normal in caliber. A midline vessel structure is noted most likely representing a portion of the anterior spinal artery. The left T7 intercostal injection demonstrates the spinal branch and the lateral muscular branches to be widely patent. The delayed arterial phase demonstrates a midline structure in the center projecting superiorly and inferiorly. This represents the anterior spinal artery. Again there is no early opacification or shunting or of a delayed venous structure noted. The right T8  intercostal injection demonstrates normal opacification of the spinal branch and the lateral muscular branches. The left T8 intercostal artery also demonstrates normal opacification of the muscular and also of the medially positioned spinal branch. The delayed arterial phase demonstrates a wisp of anterior spinal artery. The right T10 intercostal injection demonstrates normal opacification of the muscular and the anterior spinal branches. The left T10 intercostal injection demonstrates normal opacification of the muscular branches and of the spinal branches. Projecting medially and arising superiorly is a hairpin turn of vascular structure in the midline and then projecting caudally. This is most consistent with the artery of Adamkiewicz. No arteriovenous shunting of contrast is noted. There is no delayed presence of a vascular structure. Arising just caudal to this midline vascular structure is another vascular structure rising caudal to the one above. This also projects medially and then appears to make an upward turn. This probably represents the anterior spinal artery also. The dominant hairpin structure was followed caudally to the thoracolumbar region. However, its distal opacification is poorly defined in view of the overlying extensive gas and fecal  material in the bowel. The right T11 intercostal injection demonstrates normal opacification of the muscular and the anterior spinal branches. No angiographic evidence of shunting of arteriovenous blood or of a delayed filling of a venous structure is noted in the midline or paramidline region. The left T11 intercostal artery demonstrates normal opacification of the muscular in the anterior spinal branches. There is also filling of the T12 left intercostal. The injection of the T11 left intercostal again demonstrates the presence of yet another hairpin turn or loop of a vascular structure projecting cranially and then making a turn caudally in the midline. Again this is in the midline and is most consistent with an accessory or a duplicated artery of Adamkiewicz. The right T12 intercostal injection demonstrates normal opacification of the muscular in the anterior spinal branches. Again no evidence of arteriovenous shunting or of a delayed midline vessel structure is noted. The left T12 intercostal injection demonstrates normal opacification of the muscular and the anterior spinal branches. Inferiorly projecting anastomoses are seen with the left lumbar branches. Selective cannulation and arteriograms were performed of the right and left L1 lumbar arteries, the left L2 and transiently the right L2 arteries, the right and left L3 arteries, and the right and left L4 lumbar arteries. These demonstrate normal caliber and flow without evidence of arteriovenous shunting or delayed midline venous structures. Selective angiograms were also performed of the common iliacs which demonstrate no abnormally prominent vascular structures projecting cranially from either side. 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 Caudal extension of the artery of Adamkiewicz arising at T10 from the left intercostal artery followed to the thoracolumbar region. However, overlying gas and fecal material within the intestines precluding further visualization. Findings were discussed with the referring neurologist. Electronically Signed   By: Julieanne Cotton M.D.   On: 06/17/2016 20:22   Ir Angio/spinal Left  Result Date: 06/18/2016 INDICATION: Progressive worsening bilateral lower extremity paraparesis, right greater than left associated with paresthesias and pain. Abnormal MRI of the the lower thoracic spine. EXAM: BILATERAL COMMON CAROTID AND INNOMINATE ANGIOGRAPHY AND BILATERAL VERTEBRAL ARTERY ANGIOGRAMS. THORACIC AND LUMBAR AORTOGRAMS. COMPLETE SPINAL ANGIOGRAM OF THE CERVICAL, THORACIC AND LUMBAR REGIONS. MEDICATIONS: None. The antibiotic was administered within 1 hour  of the procedure ANESTHESIA/SEDATION: General anesthesia. FLUOROSCOPY TIME:  Fluoroscopy Time: 61 minutes 18 seconds (14782 mGy). COMPLICATIONS: None immediate. TECHNIQUE: Informed written consent was obtained from the patient after a thorough discussion of the procedural risks, benefits and alternatives. All questions were addressed. Maximal Sterile Barrier Technique was utilized including caps, mask, sterile gowns, sterile gloves, sterile drape, hand hygiene and skin antiseptic. A timeout was performed prior to the initiation of the procedure. PROCEDURE: The right groin was prepped and draped in the usual sterile fashion. Thereafter using modified Seldinger technique, transfemoral access into the right common femoral artery was obtained without difficulty. Over a 0.035 inch guidewire, a 5 French Pinnacle sheath was inserted. Through this, and also over 0.035 inch guidewire, a 5 French JB1 catheter was advanced to the aortic arch region and selectively positioned in the right common carotid artery, the right subclavian, the right vertebral  artery, the left common carotid artery and the left subclavian artery. A 5 French pigtail catheter was then utilized to perform thoracic and lumbar using the injector device. A 5 [place] Mikaelsson diagnostic catheter was then utilized to cannulate and perform selective injections of the intercostal arteries and the lumbar arteries. FINDINGS: The right common carotid arteriogram demonstrates the right external carotid artery and its major branches to be widely patent. The right internal carotid artery at the bulb to the cranial skull base opacifies normally. The petrous, the cavernous and the supraclinoid segments demonstrate normal opacification. A right posterior communicating artery is seen opacifying the right posterior cerebral artery and the superior cerebellar artery distributions. Also noted is slightly contralateral posterior cerebral artery. The right middle cerebral artery and the right anterior cerebral artery opacify normally into the capillary and venous phases. The right subclavian arteriogram demonstrates the thyrocervical and the costocervical trunks to be normal in its branches without abnormally prominent blood vessels projecting medially into the spinal canal area. The right vertebral artery origin demonstrates mild stenosis. The vessel is seen to opacify to the cranial skull base. Normal opacification is seen of the right vertebrobasilar junction and the right posterior-inferior cerebellar artery. The opacified portion the basilar artery, the posterior cerebral arteries, the superior cerebellar arteries and the anterior-inferior cerebellar arteries is normal into the delayed arterial phase. Non-opacified blood is seen in the basilar artery from the contralateral vertebral artery. The left vertebral artery origin is normal. The vessel is seen to opacify normally to the cranial skull base. Normal opacification is seen of the left vertebrobasilar junction and the left posterior inferior cerebellar  artery distribution. The basilar artery, the posterior cerebral arteries, the superior cerebellar arteries and the anterior-inferior cerebellar arteries opacify normally into the capillary and venous phases. The left subclavian arteriogram demonstrates the costocervical and thyrocervical trunk branches to be normally opacified. The left common carotid arteriogram demonstrates the left external carotid artery and its major branches to be widely patent. The left internal carotid artery at the bulb to the cranial skull base opacifies normally. The petrous, cavernous and the supraclinoid segments are widely patent. The left middle cerebral artery and the left anterior cerebral artery opacify normally into the capillary and venous phases. A left posterior communicating artery is seen opacifying the left posterior cerebral artery distribution. Constant blush is noted in the region of the middle and the inferior terminus of the left maxillary sinus probably related to inflammation. Opacification of the left superior ophthalmic vein is noted. AORTOGRAMS OF THE THORACIC AND THE ABDOMINAL AORTA The thoracic aorta demonstrates normal opacification from the level  of the distal portion of the aortic arch to the level of T12. The visualized intercostal branches from the superior intercostal artery to the level of T12 appear normal in caliber. No abnormal medially directed prominent vasculature is noted. No prominent venous structures are seen on the delayed arterial phase. The abdominal aortogram itself demonstrates normal opacification of the celiac trunk, the renal vasculature bilaterally, and the lower lumbar arteries. The bifurcation of the abdominal aorta into the common iliac arteries appears widely patent. Symmetrical lumbar arteries are noted bilaterally at the level from L3 to L5. Normal renal blush is noted with clearance of contrast. The visualized superior mesenteric artery and the inferior mesenteric arteries also  opacify normally. SPINAL ANGIOGRAM Selective cannulations and angiographic findings were obtained as mentioned below. The right superior intercostal branch is noted arising at the level of T5. This is seen to extend superiorly and laterally to the level of T2 and T3 intercostal branches. Opacification of the bronchial vasculature is also noted from this artery. The selective T5 intercostal injection demonstrates prompt opacification of the muscular and the anterior spinal branches projecting medially. Also noted is anastomosis with the right T4 intercostal branch. There is flash opacification of the left T5 intercostal artery also. Again no abnormal midline shunting or delayed opacification is noted. Also noted is opacification of the T6 intercostal artery on the right. The right T7 intercostal arterial branches demonstrate normal opacification of the muscular branches. The spinal branch also appears normal in caliber. A midline vessel structure is noted most likely representing a portion of the anterior spinal artery. The left T7 intercostal injection demonstrates the spinal branch and the lateral muscular branches to be widely patent. The delayed arterial phase demonstrates a midline structure in the center projecting superiorly and inferiorly. This represents the anterior spinal artery. Again there is no early opacification or shunting or of a delayed venous structure noted. The right T8 intercostal injection demonstrates normal opacification of the spinal branch and the lateral muscular branches. The left T8 intercostal artery also demonstrates normal opacification of the muscular and also of the medially positioned spinal branch. The delayed arterial phase demonstrates a wisp of anterior spinal artery. The right T10 intercostal injection demonstrates normal opacification of the muscular and the anterior spinal branches. The left T10 intercostal injection demonstrates normal opacification of the muscular  branches and of the spinal branches. Projecting medially and arising superiorly is a hairpin turn of vascular structure in the midline and then projecting caudally. This is most consistent with the artery of Adamkiewicz. No arteriovenous shunting of contrast is noted. There is no delayed presence of a vascular structure. Arising just caudal to this midline vascular structure is another vascular structure rising caudal to the one above. This also projects medially and then appears to make an upward turn. This probably represents the anterior spinal artery also. The dominant hairpin structure was followed caudally to the thoracolumbar region. However, its distal opacification is poorly defined in view of the overlying extensive gas and fecal material in the bowel. The right T11 intercostal injection demonstrates normal opacification of the muscular and the anterior spinal branches. No angiographic evidence of shunting of arteriovenous blood or of a delayed filling of a venous structure is noted in the midline or paramidline region. The left T11 intercostal artery demonstrates normal opacification of the muscular in the anterior spinal branches. There is also filling of the T12 left intercostal. The injection of the T11 left intercostal again demonstrates the presence of yet another  hairpin turn or loop of a vascular structure projecting cranially and then making a turn caudally in the midline. Again this is in the midline and is most consistent with an accessory or a duplicated artery of Adamkiewicz. The right T12 intercostal injection demonstrates normal opacification of the muscular in the anterior spinal branches. Again no evidence of arteriovenous shunting or of a delayed midline vessel structure is noted. The left T12 intercostal injection demonstrates normal opacification of the muscular and the anterior spinal branches. Inferiorly projecting anastomoses are seen with the left lumbar branches. Selective  cannulation and arteriograms were performed of the right and left L1 lumbar arteries, the left L2 and transiently the right L2 arteries, the right and left L3 arteries, and the right and left L4 lumbar arteries. These demonstrate normal caliber and flow without evidence of arteriovenous shunting or delayed midline venous structures. Selective angiograms were also performed of the common iliacs which demonstrate no abnormally prominent vascular structures projecting cranially from either side. 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 Caudal extension of the artery of Adamkiewicz arising at T10 from the left intercostal artery followed to the thoracolumbar region. However, overlying gas and fecal material within the intestines precluding further visualization. Findings were discussed with the referring neurologist. Electronically Signed   By: Julieanne Cotton M.D.   On: 06/17/2016 20:22   Ir Angio/spinal Left  Result Date: 06/18/2016 INDICATION: Progressive worsening bilateral lower extremity paraparesis, right greater than left associated with paresthesias and pain. Abnormal MRI of the the lower thoracic spine. EXAM: BILATERAL COMMON CAROTID AND INNOMINATE ANGIOGRAPHY AND BILATERAL VERTEBRAL ARTERY ANGIOGRAMS. THORACIC AND LUMBAR AORTOGRAMS. COMPLETE SPINAL ANGIOGRAM OF THE CERVICAL, THORACIC AND LUMBAR REGIONS. MEDICATIONS: None. The antibiotic was administered within 1 hour of the procedure ANESTHESIA/SEDATION: General anesthesia. FLUOROSCOPY TIME:  Fluoroscopy Time: 61 minutes 18 seconds (44010 mGy). COMPLICATIONS: None immediate. TECHNIQUE: Informed written consent was obtained from the patient after a thorough discussion of the procedural risks, benefits and  alternatives. All questions were addressed. Maximal Sterile Barrier Technique was utilized including caps, mask, sterile gowns, sterile gloves, sterile drape, hand hygiene and skin antiseptic. A timeout was performed prior to the initiation of the procedure. PROCEDURE: The right groin was prepped and draped in the usual sterile fashion. Thereafter using modified Seldinger technique, transfemoral access into the right common femoral artery was obtained without difficulty. Over a 0.035 inch guidewire, a 5 French Pinnacle sheath was inserted. Through this, and also over 0.035 inch guidewire, a 5 French JB1 catheter was advanced to the aortic arch region and selectively positioned in the right common carotid artery, the right subclavian, the right vertebral artery, the left common carotid artery and the left subclavian artery. A 5 French pigtail catheter was then utilized to perform thoracic and lumbar using the injector device. A 5 [place] Mikaelsson diagnostic catheter was then utilized to cannulate and perform selective injections of the intercostal arteries and the lumbar arteries. FINDINGS: The right common carotid arteriogram demonstrates the right external carotid artery and its major branches to be widely patent. The right internal carotid artery at the bulb to the cranial skull base opacifies normally. The petrous, the cavernous and the supraclinoid segments demonstrate normal opacification. A right posterior communicating artery is seen opacifying the right posterior cerebral artery and the superior cerebellar artery distributions. Also noted is  slightly contralateral posterior cerebral artery. The right middle cerebral artery and the right anterior cerebral artery opacify normally into the capillary and venous phases. The right subclavian arteriogram demonstrates the thyrocervical and the costocervical trunks to be normal in its branches without abnormally prominent blood vessels projecting medially into the  spinal canal area. The right vertebral artery origin demonstrates mild stenosis. The vessel is seen to opacify to the cranial skull base. Normal opacification is seen of the right vertebrobasilar junction and the right posterior-inferior cerebellar artery. The opacified portion the basilar artery, the posterior cerebral arteries, the superior cerebellar arteries and the anterior-inferior cerebellar arteries is normal into the delayed arterial phase. Non-opacified blood is seen in the basilar artery from the contralateral vertebral artery. The left vertebral artery origin is normal. The vessel is seen to opacify normally to the cranial skull base. Normal opacification is seen of the left vertebrobasilar junction and the left posterior inferior cerebellar artery distribution. The basilar artery, the posterior cerebral arteries, the superior cerebellar arteries and the anterior-inferior cerebellar arteries opacify normally into the capillary and venous phases. The left subclavian arteriogram demonstrates the costocervical and thyrocervical trunk branches to be normally opacified. The left common carotid arteriogram demonstrates the left external carotid artery and its major branches to be widely patent. The left internal carotid artery at the bulb to the cranial skull base opacifies normally. The petrous, cavernous and the supraclinoid segments are widely patent. The left middle cerebral artery and the left anterior cerebral artery opacify normally into the capillary and venous phases. A left posterior communicating artery is seen opacifying the left posterior cerebral artery distribution. Constant blush is noted in the region of the middle and the inferior terminus of the left maxillary sinus probably related to inflammation. Opacification of the left superior ophthalmic vein is noted. AORTOGRAMS OF THE THORACIC AND THE ABDOMINAL AORTA The thoracic aorta demonstrates normal opacification from the level of the distal  portion of the aortic arch to the level of T12. The visualized intercostal branches from the superior intercostal artery to the level of T12 appear normal in caliber. No abnormal medially directed prominent vasculature is noted. No prominent venous structures are seen on the delayed arterial phase. The abdominal aortogram itself demonstrates normal opacification of the celiac trunk, the renal vasculature bilaterally, and the lower lumbar arteries. The bifurcation of the abdominal aorta into the common iliac arteries appears widely patent. Symmetrical lumbar arteries are noted bilaterally at the level from L3 to L5. Normal renal blush is noted with clearance of contrast. The visualized superior mesenteric artery and the inferior mesenteric arteries also opacify normally. SPINAL ANGIOGRAM Selective cannulations and angiographic findings were obtained as mentioned below. The right superior intercostal branch is noted arising at the level of T5. This is seen to extend superiorly and laterally to the level of T2 and T3 intercostal branches. Opacification of the bronchial vasculature is also noted from this artery. The selective T5 intercostal injection demonstrates prompt opacification of the muscular and the anterior spinal branches projecting medially. Also noted is anastomosis with the right T4 intercostal branch. There is flash opacification of the left T5 intercostal artery also. Again no abnormal midline shunting or delayed opacification is noted. Also noted is opacification of the T6 intercostal artery on the right. The right T7 intercostal arterial branches demonstrate normal opacification of the muscular branches. The spinal branch also appears normal in caliber. A midline vessel structure is noted most likely representing a portion of the anterior spinal artery. The  left T7 intercostal injection demonstrates the spinal branch and the lateral muscular branches to be widely patent. The delayed arterial phase  demonstrates a midline structure in the center projecting superiorly and inferiorly. This represents the anterior spinal artery. Again there is no early opacification or shunting or of a delayed venous structure noted. The right T8 intercostal injection demonstrates normal opacification of the spinal branch and the lateral muscular branches. The left T8 intercostal artery also demonstrates normal opacification of the muscular and also of the medially positioned spinal branch. The delayed arterial phase demonstrates a wisp of anterior spinal artery. The right T10 intercostal injection demonstrates normal opacification of the muscular and the anterior spinal branches. The left T10 intercostal injection demonstrates normal opacification of the muscular branches and of the spinal branches. Projecting medially and arising superiorly is a hairpin turn of vascular structure in the midline and then projecting caudally. This is most consistent with the artery of Adamkiewicz. No arteriovenous shunting of contrast is noted. There is no delayed presence of a vascular structure. Arising just caudal to this midline vascular structure is another vascular structure rising caudal to the one above. This also projects medially and then appears to make an upward turn. This probably represents the anterior spinal artery also. The dominant hairpin structure was followed caudally to the thoracolumbar region. However, its distal opacification is poorly defined in view of the overlying extensive gas and fecal material in the bowel. The right T11 intercostal injection demonstrates normal opacification of the muscular and the anterior spinal branches. No angiographic evidence of shunting of arteriovenous blood or of a delayed filling of a venous structure is noted in the midline or paramidline region. The left T11 intercostal artery demonstrates normal opacification of the muscular in the anterior spinal branches. There is also filling of the  T12 left intercostal. The injection of the T11 left intercostal again demonstrates the presence of yet another hairpin turn or loop of a vascular structure projecting cranially and then making a turn caudally in the midline. Again this is in the midline and is most consistent with an accessory or a duplicated artery of Adamkiewicz. The right T12 intercostal injection demonstrates normal opacification of the muscular in the anterior spinal branches. Again no evidence of arteriovenous shunting or of a delayed midline vessel structure is noted. The left T12 intercostal injection demonstrates normal opacification of the muscular and the anterior spinal branches. Inferiorly projecting anastomoses are seen with the left lumbar branches. Selective cannulation and arteriograms were performed of the right and left L1 lumbar arteries, the left L2 and transiently the right L2 arteries, the right and left L3 arteries, and the right and left L4 lumbar arteries. These demonstrate normal caliber and flow without evidence of arteriovenous shunting or delayed midline venous structures. Selective angiograms were also performed of the common iliacs which demonstrate no abnormally prominent vascular structures projecting cranially from either side. 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 Caudal extension of the artery of Adamkiewicz arising at T10 from the left intercostal artery followed to the thoracolumbar region. However, overlying gas and fecal material within the intestines precluding further visualization. Findings were discussed with the referring neurologist. Electronically Signed   By: Julieanne Cotton M.D.   On: 06/17/2016 20:22   Ir Angio/spinal Left  Result Date:  06/18/2016 INDICATION: Progressive  worsening bilateral lower extremity paraparesis, right greater than left associated with paresthesias and pain. Abnormal MRI of the the lower thoracic spine. EXAM: BILATERAL COMMON CAROTID AND INNOMINATE ANGIOGRAPHY AND BILATERAL VERTEBRAL ARTERY ANGIOGRAMS. THORACIC AND LUMBAR AORTOGRAMS. COMPLETE SPINAL ANGIOGRAM OF THE CERVICAL, THORACIC AND LUMBAR REGIONS. MEDICATIONS: None. The antibiotic was administered within 1 hour of the procedure ANESTHESIA/SEDATION: General anesthesia. FLUOROSCOPY TIME:  Fluoroscopy Time: 61 minutes 18 seconds (16109 mGy). COMPLICATIONS: None immediate. TECHNIQUE: Informed written consent was obtained from the patient after a thorough discussion of the procedural risks, benefits and alternatives. All questions were addressed. Maximal Sterile Barrier Technique was utilized including caps, mask, sterile gowns, sterile gloves, sterile drape, hand hygiene and skin antiseptic. A timeout was performed prior to the initiation of the procedure. PROCEDURE: The right groin was prepped and draped in the usual sterile fashion. Thereafter using modified Seldinger technique, transfemoral access into the right common femoral artery was obtained without difficulty. Over a 0.035 inch guidewire, a 5 French Pinnacle sheath was inserted. Through this, and also over 0.035 inch guidewire, a 5 French JB1 catheter was advanced to the aortic arch region and selectively positioned in the right common carotid artery, the right subclavian, the right vertebral artery, the left common carotid artery and the left subclavian artery. A 5 French pigtail catheter was then utilized to perform thoracic and lumbar using the injector device. A 5 [place] Mikaelsson diagnostic catheter was then utilized to cannulate and perform selective injections of the intercostal arteries and the lumbar arteries. FINDINGS: The right common carotid arteriogram demonstrates the right external carotid artery  and its major branches to be widely patent. The right internal carotid artery at the bulb to the cranial skull base opacifies normally. The petrous, the cavernous and the supraclinoid segments demonstrate normal opacification. A right posterior communicating artery is seen opacifying the right posterior cerebral artery and the superior cerebellar artery distributions. Also noted is slightly contralateral posterior cerebral artery. The right middle cerebral artery and the right anterior cerebral artery opacify normally into the capillary and venous phases. The right subclavian arteriogram demonstrates the thyrocervical and the costocervical trunks to be normal in its branches without abnormally prominent blood vessels projecting medially into the spinal canal area. The right vertebral artery origin demonstrates mild stenosis. The vessel is seen to opacify to the cranial skull base. Normal opacification is seen of the right vertebrobasilar junction and the right posterior-inferior cerebellar artery. The opacified portion the basilar artery, the posterior cerebral arteries, the superior cerebellar arteries and the anterior-inferior cerebellar arteries is normal into the delayed arterial phase. Non-opacified blood is seen in the basilar artery from the contralateral vertebral artery. The left vertebral artery origin is normal. The vessel is seen to opacify normally to the cranial skull base. Normal opacification is seen of the left vertebrobasilar junction and the left posterior inferior cerebellar artery distribution. The basilar artery, the posterior cerebral arteries, the superior cerebellar arteries and the anterior-inferior cerebellar arteries opacify normally into the capillary and venous phases. The left subclavian arteriogram demonstrates the costocervical and thyrocervical trunk branches to be normally opacified. The left common carotid arteriogram demonstrates the left external carotid artery and its major  branches to be widely patent. The left internal carotid artery at the bulb to the cranial skull base opacifies normally. The petrous, cavernous and the supraclinoid segments are widely patent. The left middle cerebral artery and the left anterior cerebral artery opacify normally into the capillary and venous phases. A left posterior communicating artery is  seen opacifying the left posterior cerebral artery distribution. Constant blush is noted in the region of the middle and the inferior terminus of the left maxillary sinus probably related to inflammation. Opacification of the left superior ophthalmic vein is noted. AORTOGRAMS OF THE THORACIC AND THE ABDOMINAL AORTA The thoracic aorta demonstrates normal opacification from the level of the distal portion of the aortic arch to the level of T12. The visualized intercostal branches from the superior intercostal artery to the level of T12 appear normal in caliber. No abnormal medially directed prominent vasculature is noted. No prominent venous structures are seen on the delayed arterial phase. The abdominal aortogram itself demonstrates normal opacification of the celiac trunk, the renal vasculature bilaterally, and the lower lumbar arteries. The bifurcation of the abdominal aorta into the common iliac arteries appears widely patent. Symmetrical lumbar arteries are noted bilaterally at the level from L3 to L5. Normal renal blush is noted with clearance of contrast. The visualized superior mesenteric artery and the inferior mesenteric arteries also opacify normally. SPINAL ANGIOGRAM Selective cannulations and angiographic findings were obtained as mentioned below. The right superior intercostal branch is noted arising at the level of T5. This is seen to extend superiorly and laterally to the level of T2 and T3 intercostal branches. Opacification of the bronchial vasculature is also noted from this artery. The selective T5 intercostal injection demonstrates prompt  opacification of the muscular and the anterior spinal branches projecting medially. Also noted is anastomosis with the right T4 intercostal branch. There is flash opacification of the left T5 intercostal artery also. Again no abnormal midline shunting or delayed opacification is noted. Also noted is opacification of the T6 intercostal artery on the right. The right T7 intercostal arterial branches demonstrate normal opacification of the muscular branches. The spinal branch also appears normal in caliber. A midline vessel structure is noted most likely representing a portion of the anterior spinal artery. The left T7 intercostal injection demonstrates the spinal branch and the lateral muscular branches to be widely patent. The delayed arterial phase demonstrates a midline structure in the center projecting superiorly and inferiorly. This represents the anterior spinal artery. Again there is no early opacification or shunting or of a delayed venous structure noted. The right T8 intercostal injection demonstrates normal opacification of the spinal branch and the lateral muscular branches. The left T8 intercostal artery also demonstrates normal opacification of the muscular and also of the medially positioned spinal branch. The delayed arterial phase demonstrates a wisp of anterior spinal artery. The right T10 intercostal injection demonstrates normal opacification of the muscular and the anterior spinal branches. The left T10 intercostal injection demonstrates normal opacification of the muscular branches and of the spinal branches. Projecting medially and arising superiorly is a hairpin turn of vascular structure in the midline and then projecting caudally. This is most consistent with the artery of Adamkiewicz. No arteriovenous shunting of contrast is noted. There is no delayed presence of a vascular structure. Arising just caudal to this midline vascular structure is another vascular structure rising caudal to the  one above. This also projects medially and then appears to make an upward turn. This probably represents the anterior spinal artery also. The dominant hairpin structure was followed caudally to the thoracolumbar region. However, its distal opacification is poorly defined in view of the overlying extensive gas and fecal material in the bowel. The right T11 intercostal injection demonstrates normal opacification of the muscular and the anterior spinal branches. No angiographic evidence of shunting of arteriovenous  blood or of a delayed filling of a venous structure is noted in the midline or paramidline region. The left T11 intercostal artery demonstrates normal opacification of the muscular in the anterior spinal branches. There is also filling of the T12 left intercostal. The injection of the T11 left intercostal again demonstrates the presence of yet another hairpin turn or loop of a vascular structure projecting cranially and then making a turn caudally in the midline. Again this is in the midline and is most consistent with an accessory or a duplicated artery of Adamkiewicz. The right T12 intercostal injection demonstrates normal opacification of the muscular in the anterior spinal branches. Again no evidence of arteriovenous shunting or of a delayed midline vessel structure is noted. The left T12 intercostal injection demonstrates normal opacification of the muscular and the anterior spinal branches. Inferiorly projecting anastomoses are seen with the left lumbar branches. Selective cannulation and arteriograms were performed of the right and left L1 lumbar arteries, the left L2 and transiently the right L2 arteries, the right and left L3 arteries, and the right and left L4 lumbar arteries. These demonstrate normal caliber and flow without evidence of arteriovenous shunting or delayed midline venous structures. Selective angiograms were also performed of the common iliacs which demonstrate no abnormally  prominent vascular structures projecting cranially from either side. 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 Caudal extension of the artery of Adamkiewicz arising at T10 from the left intercostal artery followed to the thoracolumbar region. However, overlying gas and fecal material within the intestines precluding further visualization. Findings were discussed with the referring neurologist. Electronically Signed   By: Julieanne Cotton M.D.   On: 06/17/2016 20:22   Ir Angio/spinal Left  Result Date: 06/18/2016 INDICATION: Progressive worsening bilateral lower extremity paraparesis, right greater than left associated with paresthesias and pain. Abnormal MRI of the the lower thoracic spine. EXAM: BILATERAL COMMON CAROTID AND INNOMINATE ANGIOGRAPHY AND BILATERAL VERTEBRAL ARTERY ANGIOGRAMS. THORACIC AND LUMBAR AORTOGRAMS. COMPLETE SPINAL ANGIOGRAM OF THE CERVICAL, THORACIC AND LUMBAR REGIONS. MEDICATIONS: None. The antibiotic was administered within 1 hour of the procedure ANESTHESIA/SEDATION: General anesthesia. FLUOROSCOPY TIME:  Fluoroscopy Time: 61 minutes 18 seconds (16109 mGy). COMPLICATIONS: None immediate. TECHNIQUE: Informed written consent was obtained from the patient after a thorough discussion of the procedural risks, benefits and alternatives. All questions were addressed. Maximal Sterile Barrier Technique was utilized including caps, mask, sterile gowns, sterile gloves, sterile drape, hand hygiene and skin antiseptic. A timeout was performed prior to the initiation of the procedure. PROCEDURE: The right groin was prepped and draped in the usual sterile fashion. Thereafter using modified Seldinger technique, transfemoral access into the right common femoral  artery was obtained without difficulty. Over a 0.035 inch guidewire, a 5 French Pinnacle sheath was inserted. Through this, and also over 0.035 inch guidewire, a 5 French JB1 catheter was advanced to the aortic arch region and selectively positioned in the right common carotid artery, the right subclavian, the right vertebral artery, the left common carotid artery and the left subclavian artery. A 5 French pigtail catheter was then utilized to perform thoracic and lumbar using the injector device. A 5 [place] Mikaelsson diagnostic catheter was then utilized to cannulate and perform selective injections of the intercostal arteries and the lumbar arteries. FINDINGS: The right common carotid arteriogram demonstrates the right external carotid  artery and its major branches to be widely patent. The right internal carotid artery at the bulb to the cranial skull base opacifies normally. The petrous, the cavernous and the supraclinoid segments demonstrate normal opacification. A right posterior communicating artery is seen opacifying the right posterior cerebral artery and the superior cerebellar artery distributions. Also noted is slightly contralateral posterior cerebral artery. The right middle cerebral artery and the right anterior cerebral artery opacify normally into the capillary and venous phases. The right subclavian arteriogram demonstrates the thyrocervical and the costocervical trunks to be normal in its branches without abnormally prominent blood vessels projecting medially into the spinal canal area. The right vertebral artery origin demonstrates mild stenosis. The vessel is seen to opacify to the cranial skull base. Normal opacification is seen of the right vertebrobasilar junction and the right posterior-inferior cerebellar artery. The opacified portion the basilar artery, the posterior cerebral arteries, the superior cerebellar arteries and the anterior-inferior cerebellar arteries is normal into the delayed  arterial phase. Non-opacified blood is seen in the basilar artery from the contralateral vertebral artery. The left vertebral artery origin is normal. The vessel is seen to opacify normally to the cranial skull base. Normal opacification is seen of the left vertebrobasilar junction and the left posterior inferior cerebellar artery distribution. The basilar artery, the posterior cerebral arteries, the superior cerebellar arteries and the anterior-inferior cerebellar arteries opacify normally into the capillary and venous phases. The left subclavian arteriogram demonstrates the costocervical and thyrocervical trunk branches to be normally opacified. The left common carotid arteriogram demonstrates the left external carotid artery and its major branches to be widely patent. The left internal carotid artery at the bulb to the cranial skull base opacifies normally. The petrous, cavernous and the supraclinoid segments are widely patent. The left middle cerebral artery and the left anterior cerebral artery opacify normally into the capillary and venous phases. A left posterior communicating artery is seen opacifying the left posterior cerebral artery distribution. Constant blush is noted in the region of the middle and the inferior terminus of the left maxillary sinus probably related to inflammation. Opacification of the left superior ophthalmic vein is noted. AORTOGRAMS OF THE THORACIC AND THE ABDOMINAL AORTA The thoracic aorta demonstrates normal opacification from the level of the distal portion of the aortic arch to the level of T12. The visualized intercostal branches from the superior intercostal artery to the level of T12 appear normal in caliber. No abnormal medially directed prominent vasculature is noted. No prominent venous structures are seen on the delayed arterial phase. The abdominal aortogram itself demonstrates normal opacification of the celiac trunk, the renal vasculature bilaterally, and the lower  lumbar arteries. The bifurcation of the abdominal aorta into the common iliac arteries appears widely patent. Symmetrical lumbar arteries are noted bilaterally at the level from L3 to L5. Normal renal blush is noted with clearance of contrast. The visualized superior mesenteric artery and the inferior mesenteric arteries also opacify normally. SPINAL ANGIOGRAM Selective cannulations and angiographic findings were obtained as mentioned below. The right superior intercostal branch is noted arising at the level of T5. This is seen to extend superiorly and laterally to the level of T2 and T3 intercostal branches. Opacification of the bronchial vasculature is also noted from this artery. The selective T5 intercostal injection demonstrates prompt opacification of the muscular and the anterior spinal branches projecting medially. Also noted is anastomosis with the right T4 intercostal branch. There is flash opacification of the left T5 intercostal artery also. Again no abnormal  midline shunting or delayed opacification is noted. Also noted is opacification of the T6 intercostal artery on the right. The right T7 intercostal arterial branches demonstrate normal opacification of the muscular branches. The spinal branch also appears normal in caliber. A midline vessel structure is noted most likely representing a portion of the anterior spinal artery. The left T7 intercostal injection demonstrates the spinal branch and the lateral muscular branches to be widely patent. The delayed arterial phase demonstrates a midline structure in the center projecting superiorly and inferiorly. This represents the anterior spinal artery. Again there is no early opacification or shunting or of a delayed venous structure noted. The right T8 intercostal injection demonstrates normal opacification of the spinal branch and the lateral muscular branches. The left T8 intercostal artery also demonstrates normal opacification of the muscular and also  of the medially positioned spinal branch. The delayed arterial phase demonstrates a wisp of anterior spinal artery. The right T10 intercostal injection demonstrates normal opacification of the muscular and the anterior spinal branches. The left T10 intercostal injection demonstrates normal opacification of the muscular branches and of the spinal branches. Projecting medially and arising superiorly is a hairpin turn of vascular structure in the midline and then projecting caudally. This is most consistent with the artery of Adamkiewicz. No arteriovenous shunting of contrast is noted. There is no delayed presence of a vascular structure. Arising just caudal to this midline vascular structure is another vascular structure rising caudal to the one above. This also projects medially and then appears to make an upward turn. This probably represents the anterior spinal artery also. The dominant hairpin structure was followed caudally to the thoracolumbar region. However, its distal opacification is poorly defined in view of the overlying extensive gas and fecal material in the bowel. The right T11 intercostal injection demonstrates normal opacification of the muscular and the anterior spinal branches. No angiographic evidence of shunting of arteriovenous blood or of a delayed filling of a venous structure is noted in the midline or paramidline region. The left T11 intercostal artery demonstrates normal opacification of the muscular in the anterior spinal branches. There is also filling of the T12 left intercostal. The injection of the T11 left intercostal again demonstrates the presence of yet another hairpin turn or loop of a vascular structure projecting cranially and then making a turn caudally in the midline. Again this is in the midline and is most consistent with an accessory or a duplicated artery of Adamkiewicz. The right T12 intercostal injection demonstrates normal opacification of the muscular in the anterior  spinal branches. Again no evidence of arteriovenous shunting or of a delayed midline vessel structure is noted. The left T12 intercostal injection demonstrates normal opacification of the muscular and the anterior spinal branches. Inferiorly projecting anastomoses are seen with the left lumbar branches. Selective cannulation and arteriograms were performed of the right and left L1 lumbar arteries, the left L2 and transiently the right L2 arteries, the right and left L3 arteries, and the right and left L4 lumbar arteries. These demonstrate normal caliber and flow without evidence of arteriovenous shunting or delayed midline venous structures. Selective angiograms were also performed of the common iliacs which demonstrate no abnormally prominent vascular structures projecting cranially from either side. 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 Caudal extension of the artery of Adamkiewicz arising  at T10 from the left intercostal artery followed to the thoracolumbar region. However, overlying gas and fecal material within the intestines precluding further visualization. Findings were discussed with the referring neurologist. Electronically Signed   By: Julieanne Cotton M.D.   On: 06/17/2016 20:22   Ir Angio/spinal Left  Result Date: 06/18/2016 INDICATION: Progressive worsening bilateral lower extremity paraparesis, right greater than left associated with paresthesias and pain. Abnormal MRI of the the lower thoracic spine. EXAM: BILATERAL COMMON CAROTID AND INNOMINATE ANGIOGRAPHY AND BILATERAL VERTEBRAL ARTERY ANGIOGRAMS. THORACIC AND LUMBAR AORTOGRAMS. COMPLETE SPINAL ANGIOGRAM OF THE CERVICAL, THORACIC AND LUMBAR REGIONS. MEDICATIONS: None. The antibiotic was administered  within 1 hour of the procedure ANESTHESIA/SEDATION: General anesthesia. FLUOROSCOPY TIME:  Fluoroscopy Time: 61 minutes 18 seconds (16109 mGy). COMPLICATIONS: None immediate. TECHNIQUE: Informed written consent was obtained from the patient after a thorough discussion of the procedural risks, benefits and alternatives. All questions were addressed. Maximal Sterile Barrier Technique was utilized including caps, mask, sterile gowns, sterile gloves, sterile drape, hand hygiene and skin antiseptic. A timeout was performed prior to the initiation of the procedure. PROCEDURE: The right groin was prepped and draped in the usual sterile fashion. Thereafter using modified Seldinger technique, transfemoral access into the right common femoral artery was obtained without difficulty. Over a 0.035 inch guidewire, a 5 French Pinnacle sheath was inserted. Through this, and also over 0.035 inch guidewire, a 5 French JB1 catheter was advanced to the aortic arch region and selectively positioned in the right common carotid artery, the right subclavian, the right vertebral artery, the left common carotid artery and the left subclavian artery. A 5 French pigtail catheter was then utilized to perform thoracic and lumbar using the injector device. A 5 [place] Mikaelsson diagnostic catheter was then utilized to cannulate and perform selective injections of the intercostal arteries and the lumbar arteries. FINDINGS: The right common carotid arteriogram demonstrates the right external carotid artery and its major branches to be widely patent. The right internal carotid artery at the bulb to the cranial skull base opacifies normally. The petrous, the cavernous and the supraclinoid segments demonstrate normal opacification. A right posterior communicating artery is seen opacifying the right posterior cerebral artery and the superior cerebellar artery distributions. Also noted is slightly contralateral posterior cerebral artery. The right  middle cerebral artery and the right anterior cerebral artery opacify normally into the capillary and venous phases. The right subclavian arteriogram demonstrates the thyrocervical and the costocervical trunks to be normal in its branches without abnormally prominent blood vessels projecting medially into the spinal canal area. The right vertebral artery origin demonstrates mild stenosis. The vessel is seen to opacify to the cranial skull base. Normal opacification is seen of the right vertebrobasilar junction and the right posterior-inferior cerebellar artery. The opacified portion the basilar artery, the posterior cerebral arteries, the superior cerebellar arteries and the anterior-inferior cerebellar arteries is normal into the delayed arterial phase. Non-opacified blood is seen in the basilar artery from the contralateral vertebral artery. The left vertebral artery origin is normal. The vessel is seen to opacify normally to the cranial skull base. Normal opacification is seen of the left vertebrobasilar junction and the left posterior inferior cerebellar artery distribution. The basilar artery, the posterior cerebral arteries, the superior cerebellar arteries and the anterior-inferior cerebellar arteries opacify normally into the capillary and venous phases. The left subclavian arteriogram demonstrates the costocervical and thyrocervical trunk branches to be normally opacified. The left common carotid arteriogram demonstrates the left external carotid artery and its  major branches to be widely patent. The left internal carotid artery at the bulb to the cranial skull base opacifies normally. The petrous, cavernous and the supraclinoid segments are widely patent. The left middle cerebral artery and the left anterior cerebral artery opacify normally into the capillary and venous phases. A left posterior communicating artery is seen opacifying the left posterior cerebral artery distribution. Constant blush is noted  in the region of the middle and the inferior terminus of the left maxillary sinus probably related to inflammation. Opacification of the left superior ophthalmic vein is noted. AORTOGRAMS OF THE THORACIC AND THE ABDOMINAL AORTA The thoracic aorta demonstrates normal opacification from the level of the distal portion of the aortic arch to the level of T12. The visualized intercostal branches from the superior intercostal artery to the level of T12 appear normal in caliber. No abnormal medially directed prominent vasculature is noted. No prominent venous structures are seen on the delayed arterial phase. The abdominal aortogram itself demonstrates normal opacification of the celiac trunk, the renal vasculature bilaterally, and the lower lumbar arteries. The bifurcation of the abdominal aorta into the common iliac arteries appears widely patent. Symmetrical lumbar arteries are noted bilaterally at the level from L3 to L5. Normal renal blush is noted with clearance of contrast. The visualized superior mesenteric artery and the inferior mesenteric arteries also opacify normally. SPINAL ANGIOGRAM Selective cannulations and angiographic findings were obtained as mentioned below. The right superior intercostal branch is noted arising at the level of T5. This is seen to extend superiorly and laterally to the level of T2 and T3 intercostal branches. Opacification of the bronchial vasculature is also noted from this artery. The selective T5 intercostal injection demonstrates prompt opacification of the muscular and the anterior spinal branches projecting medially. Also noted is anastomosis with the right T4 intercostal branch. There is flash opacification of the left T5 intercostal artery also. Again no abnormal midline shunting or delayed opacification is noted. Also noted is opacification of the T6 intercostal artery on the right. The right T7 intercostal arterial branches demonstrate normal opacification of the muscular  branches. The spinal branch also appears normal in caliber. A midline vessel structure is noted most likely representing a portion of the anterior spinal artery. The left T7 intercostal injection demonstrates the spinal branch and the lateral muscular branches to be widely patent. The delayed arterial phase demonstrates a midline structure in the center projecting superiorly and inferiorly. This represents the anterior spinal artery. Again there is no early opacification or shunting or of a delayed venous structure noted. The right T8 intercostal injection demonstrates normal opacification of the spinal branch and the lateral muscular branches. The left T8 intercostal artery also demonstrates normal opacification of the muscular and also of the medially positioned spinal branch. The delayed arterial phase demonstrates a wisp of anterior spinal artery. The right T10 intercostal injection demonstrates normal opacification of the muscular and the anterior spinal branches. The left T10 intercostal injection demonstrates normal opacification of the muscular branches and of the spinal branches. Projecting medially and arising superiorly is a hairpin turn of vascular structure in the midline and then projecting caudally. This is most consistent with the artery of Adamkiewicz. No arteriovenous shunting of contrast is noted. There is no delayed presence of a vascular structure. Arising just caudal to this midline vascular structure is another vascular structure rising caudal to the one above. This also projects medially and then appears to make an upward turn. This probably represents the anterior spinal  artery also. The dominant hairpin structure was followed caudally to the thoracolumbar region. However, its distal opacification is poorly defined in view of the overlying extensive gas and fecal material in the bowel. The right T11 intercostal injection demonstrates normal opacification of the muscular and the anterior  spinal branches. No angiographic evidence of shunting of arteriovenous blood or of a delayed filling of a venous structure is noted in the midline or paramidline region. The left T11 intercostal artery demonstrates normal opacification of the muscular in the anterior spinal branches. There is also filling of the T12 left intercostal. The injection of the T11 left intercostal again demonstrates the presence of yet another hairpin turn or loop of a vascular structure projecting cranially and then making a turn caudally in the midline. Again this is in the midline and is most consistent with an accessory or a duplicated artery of Adamkiewicz. The right T12 intercostal injection demonstrates normal opacification of the muscular in the anterior spinal branches. Again no evidence of arteriovenous shunting or of a delayed midline vessel structure is noted. The left T12 intercostal injection demonstrates normal opacification of the muscular and the anterior spinal branches. Inferiorly projecting anastomoses are seen with the left lumbar branches. Selective cannulation and arteriograms were performed of the right and left L1 lumbar arteries, the left L2 and transiently the right L2 arteries, the right and left L3 arteries, and the right and left L4 lumbar arteries. These demonstrate normal caliber and flow without evidence of arteriovenous shunting or delayed midline venous structures. Selective angiograms were also performed of the common iliacs which demonstrate no abnormally prominent vascular structures projecting cranially from either side. 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 Caudal extension of the artery of Adamkiewicz arising at T10 from the left intercostal artery followed  to the thoracolumbar region. However, overlying gas and fecal material within the intestines precluding further visualization. Findings were discussed with the referring neurologist. Electronically Signed   By: Julieanne Cotton M.D.   On: 06/17/2016 20:22   Ir Angio/spinal Right  Result Date: 06/18/2016 INDICATION: Progressive worsening bilateral lower extremity paraparesis, right greater than left associated with paresthesias and pain. Abnormal MRI of the the lower thoracic spine. EXAM: BILATERAL COMMON CAROTID AND INNOMINATE ANGIOGRAPHY AND BILATERAL VERTEBRAL ARTERY ANGIOGRAMS. THORACIC AND LUMBAR AORTOGRAMS. COMPLETE SPINAL ANGIOGRAM OF THE CERVICAL, THORACIC AND LUMBAR REGIONS. MEDICATIONS: None. The antibiotic was administered within 1 hour of the procedure ANESTHESIA/SEDATION: General anesthesia. FLUOROSCOPY TIME:  Fluoroscopy Time: 61 minutes 18 seconds (16109 mGy). COMPLICATIONS: None immediate. TECHNIQUE: Informed written consent was obtained from the patient after a thorough discussion of the procedural risks, benefits and alternatives. All questions were addressed. Maximal Sterile Barrier Technique was utilized including caps, mask, sterile gowns, sterile gloves, sterile drape, hand hygiene and skin antiseptic. A timeout was performed prior to the initiation of the procedure. PROCEDURE: The right groin was prepped and draped in the usual sterile fashion. Thereafter using modified Seldinger technique, transfemoral access into the right common femoral artery was obtained without difficulty. Over a 0.035 inch guidewire, a 5 French Pinnacle sheath was inserted. Through this, and also over 0.035 inch guidewire, a 5 French JB1 catheter was advanced to the aortic arch region and selectively positioned in the right common carotid artery, the right subclavian, the right vertebral artery, the left common carotid artery  and the left subclavian artery. A 5 French pigtail catheter was then utilized to perform  thoracic and lumbar using the injector device. A 5 [place] Mikaelsson diagnostic catheter was then utilized to cannulate and perform selective injections of the intercostal arteries and the lumbar arteries. FINDINGS: The right common carotid arteriogram demonstrates the right external carotid artery and its major branches to be widely patent. The right internal carotid artery at the bulb to the cranial skull base opacifies normally. The petrous, the cavernous and the supraclinoid segments demonstrate normal opacification. A right posterior communicating artery is seen opacifying the right posterior cerebral artery and the superior cerebellar artery distributions. Also noted is slightly contralateral posterior cerebral artery. The right middle cerebral artery and the right anterior cerebral artery opacify normally into the capillary and venous phases. The right subclavian arteriogram demonstrates the thyrocervical and the costocervical trunks to be normal in its branches without abnormally prominent blood vessels projecting medially into the spinal canal area. The right vertebral artery origin demonstrates mild stenosis. The vessel is seen to opacify to the cranial skull base. Normal opacification is seen of the right vertebrobasilar junction and the right posterior-inferior cerebellar artery. The opacified portion the basilar artery, the posterior cerebral arteries, the superior cerebellar arteries and the anterior-inferior cerebellar arteries is normal into the delayed arterial phase. Non-opacified blood is seen in the basilar artery from the contralateral vertebral artery. The left vertebral artery origin is normal. The vessel is seen to opacify normally to the cranial skull base. Normal opacification is seen of the left vertebrobasilar junction and the left posterior inferior cerebellar artery distribution. The basilar artery, the posterior cerebral arteries, the superior cerebellar arteries and the  anterior-inferior cerebellar arteries opacify normally into the capillary and venous phases. The left subclavian arteriogram demonstrates the costocervical and thyrocervical trunk branches to be normally opacified. The left common carotid arteriogram demonstrates the left external carotid artery and its major branches to be widely patent. The left internal carotid artery at the bulb to the cranial skull base opacifies normally. The petrous, cavernous and the supraclinoid segments are widely patent. The left middle cerebral artery and the left anterior cerebral artery opacify normally into the capillary and venous phases. A left posterior communicating artery is seen opacifying the left posterior cerebral artery distribution. Constant blush is noted in the region of the middle and the inferior terminus of the left maxillary sinus probably related to inflammation. Opacification of the left superior ophthalmic vein is noted. AORTOGRAMS OF THE THORACIC AND THE ABDOMINAL AORTA The thoracic aorta demonstrates normal opacification from the level of the distal portion of the aortic arch to the level of T12. The visualized intercostal branches from the superior intercostal artery to the level of T12 appear normal in caliber. No abnormal medially directed prominent vasculature is noted. No prominent venous structures are seen on the delayed arterial phase. The abdominal aortogram itself demonstrates normal opacification of the celiac trunk, the renal vasculature bilaterally, and the lower lumbar arteries. The bifurcation of the abdominal aorta into the common iliac arteries appears widely patent. Symmetrical lumbar arteries are noted bilaterally at the level from L3 to L5. Normal renal blush is noted with clearance of contrast. The visualized superior mesenteric artery and the inferior mesenteric arteries also opacify normally. SPINAL ANGIOGRAM Selective cannulations and angiographic findings were obtained as mentioned below.  The right superior intercostal branch is noted arising at the level of T5. This is seen to extend superiorly and laterally to the level of T2  and T3 intercostal branches. Opacification of the bronchial vasculature is also noted from this artery. The selective T5 intercostal injection demonstrates prompt opacification of the muscular and the anterior spinal branches projecting medially. Also noted is anastomosis with the right T4 intercostal branch. There is flash opacification of the left T5 intercostal artery also. Again no abnormal midline shunting or delayed opacification is noted. Also noted is opacification of the T6 intercostal artery on the right. The right T7 intercostal arterial branches demonstrate normal opacification of the muscular branches. The spinal branch also appears normal in caliber. A midline vessel structure is noted most likely representing a portion of the anterior spinal artery. The left T7 intercostal injection demonstrates the spinal branch and the lateral muscular branches to be widely patent. The delayed arterial phase demonstrates a midline structure in the center projecting superiorly and inferiorly. This represents the anterior spinal artery. Again there is no early opacification or shunting or of a delayed venous structure noted. The right T8 intercostal injection demonstrates normal opacification of the spinal branch and the lateral muscular branches. The left T8 intercostal artery also demonstrates normal opacification of the muscular and also of the medially positioned spinal branch. The delayed arterial phase demonstrates a wisp of anterior spinal artery. The right T10 intercostal injection demonstrates normal opacification of the muscular and the anterior spinal branches. The left T10 intercostal injection demonstrates normal opacification of the muscular branches and of the spinal branches. Projecting medially and arising superiorly is a hairpin turn of vascular structure in  the midline and then projecting caudally. This is most consistent with the artery of Adamkiewicz. No arteriovenous shunting of contrast is noted. There is no delayed presence of a vascular structure. Arising just caudal to this midline vascular structure is another vascular structure rising caudal to the one above. This also projects medially and then appears to make an upward turn. This probably represents the anterior spinal artery also. The dominant hairpin structure was followed caudally to the thoracolumbar region. However, its distal opacification is poorly defined in view of the overlying extensive gas and fecal material in the bowel. The right T11 intercostal injection demonstrates normal opacification of the muscular and the anterior spinal branches. No angiographic evidence of shunting of arteriovenous blood or of a delayed filling of a venous structure is noted in the midline or paramidline region. The left T11 intercostal artery demonstrates normal opacification of the muscular in the anterior spinal branches. There is also filling of the T12 left intercostal. The injection of the T11 left intercostal again demonstrates the presence of yet another hairpin turn or loop of a vascular structure projecting cranially and then making a turn caudally in the midline. Again this is in the midline and is most consistent with an accessory or a duplicated artery of Adamkiewicz. The right T12 intercostal injection demonstrates normal opacification of the muscular in the anterior spinal branches. Again no evidence of arteriovenous shunting or of a delayed midline vessel structure is noted. The left T12 intercostal injection demonstrates normal opacification of the muscular and the anterior spinal branches. Inferiorly projecting anastomoses are seen with the left lumbar branches. Selective cannulation and arteriograms were performed of the right and left L1 lumbar arteries, the left L2 and transiently the right L2  arteries, the right and left L3 arteries, and the right and left L4 lumbar arteries. These demonstrate normal caliber and flow without evidence of arteriovenous shunting or delayed midline venous structures. Selective angiograms were also performed of the common iliacs  which demonstrate no abnormally prominent vascular structures projecting cranially from either side. 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 Caudal extension of the artery of Adamkiewicz arising at T10 from the left intercostal artery followed to the thoracolumbar region. However, overlying gas and fecal material within the intestines precluding further visualization. Findings were discussed with the referring neurologist. Electronically Signed   By: Julieanne Cotton M.D.   On: 06/17/2016 20:22   Ir Angio/spinal Right  Result Date: 06/18/2016 INDICATION: Progressive worsening bilateral lower extremity paraparesis, right greater than left associated with paresthesias and pain. Abnormal MRI of the the lower thoracic spine. EXAM: BILATERAL COMMON CAROTID AND INNOMINATE ANGIOGRAPHY AND BILATERAL VERTEBRAL ARTERY ANGIOGRAMS. THORACIC AND LUMBAR AORTOGRAMS. COMPLETE SPINAL ANGIOGRAM OF THE CERVICAL, THORACIC AND LUMBAR REGIONS. MEDICATIONS: None. The antibiotic was administered within 1 hour of the procedure ANESTHESIA/SEDATION: General anesthesia. FLUOROSCOPY TIME:  Fluoroscopy Time: 61 minutes 18 seconds (16109 mGy). COMPLICATIONS: None immediate. TECHNIQUE: Informed written consent was obtained from the patient after a thorough discussion of the procedural risks, benefits and alternatives. All questions were addressed. Maximal Sterile Barrier Technique was utilized including caps, mask, sterile gowns, sterile  gloves, sterile drape, hand hygiene and skin antiseptic. A timeout was performed prior to the initiation of the procedure. PROCEDURE: The right groin was prepped and draped in the usual sterile fashion. Thereafter using modified Seldinger technique, transfemoral access into the right common femoral artery was obtained without difficulty. Over a 0.035 inch guidewire, a 5 French Pinnacle sheath was inserted. Through this, and also over 0.035 inch guidewire, a 5 French JB1 catheter was advanced to the aortic arch region and selectively positioned in the right common carotid artery, the right subclavian, the right vertebral artery, the left common carotid artery and the left subclavian artery. A 5 French pigtail catheter was then utilized to perform thoracic and lumbar using the injector device. A 5 [place] Mikaelsson diagnostic catheter was then utilized to cannulate and perform selective injections of the intercostal arteries and the lumbar arteries. FINDINGS: The right common carotid arteriogram demonstrates the right external carotid artery and its major branches to be widely patent. The right internal carotid artery at the bulb to the cranial skull base opacifies normally. The petrous, the cavernous and the supraclinoid segments demonstrate normal opacification. A right posterior communicating artery is seen opacifying the right posterior cerebral artery and the superior cerebellar artery distributions. Also noted is slightly contralateral posterior cerebral artery. The right middle cerebral artery and the right anterior cerebral artery opacify normally into the capillary and venous phases. The right subclavian arteriogram demonstrates the thyrocervical and the costocervical trunks to be normal in its branches without abnormally prominent blood vessels projecting medially into the spinal canal area. The right vertebral artery origin demonstrates mild stenosis. The vessel is seen to opacify to the cranial skull base.  Normal opacification is seen of the right vertebrobasilar junction and the right posterior-inferior cerebellar artery. The opacified portion the basilar artery, the posterior cerebral arteries, the superior cerebellar arteries and the anterior-inferior cerebellar arteries is normal into the delayed arterial phase. Non-opacified blood is seen in the basilar artery from the contralateral vertebral artery. The left vertebral artery origin is normal. The vessel is seen to opacify normally to the cranial skull base. Normal opacification is seen of the left vertebrobasilar junction and the left  posterior inferior cerebellar artery distribution. The basilar artery, the posterior cerebral arteries, the superior cerebellar arteries and the anterior-inferior cerebellar arteries opacify normally into the capillary and venous phases. The left subclavian arteriogram demonstrates the costocervical and thyrocervical trunk branches to be normally opacified. The left common carotid arteriogram demonstrates the left external carotid artery and its major branches to be widely patent. The left internal carotid artery at the bulb to the cranial skull base opacifies normally. The petrous, cavernous and the supraclinoid segments are widely patent. The left middle cerebral artery and the left anterior cerebral artery opacify normally into the capillary and venous phases. A left posterior communicating artery is seen opacifying the left posterior cerebral artery distribution. Constant blush is noted in the region of the middle and the inferior terminus of the left maxillary sinus probably related to inflammation. Opacification of the left superior ophthalmic vein is noted. AORTOGRAMS OF THE THORACIC AND THE ABDOMINAL AORTA The thoracic aorta demonstrates normal opacification from the level of the distal portion of the aortic arch to the level of T12. The visualized intercostal branches from the superior intercostal artery to the level of  T12 appear normal in caliber. No abnormal medially directed prominent vasculature is noted. No prominent venous structures are seen on the delayed arterial phase. The abdominal aortogram itself demonstrates normal opacification of the celiac trunk, the renal vasculature bilaterally, and the lower lumbar arteries. The bifurcation of the abdominal aorta into the common iliac arteries appears widely patent. Symmetrical lumbar arteries are noted bilaterally at the level from L3 to L5. Normal renal blush is noted with clearance of contrast. The visualized superior mesenteric artery and the inferior mesenteric arteries also opacify normally. SPINAL ANGIOGRAM Selective cannulations and angiographic findings were obtained as mentioned below. The right superior intercostal branch is noted arising at the level of T5. This is seen to extend superiorly and laterally to the level of T2 and T3 intercostal branches. Opacification of the bronchial vasculature is also noted from this artery. The selective T5 intercostal injection demonstrates prompt opacification of the muscular and the anterior spinal branches projecting medially. Also noted is anastomosis with the right T4 intercostal branch. There is flash opacification of the left T5 intercostal artery also. Again no abnormal midline shunting or delayed opacification is noted. Also noted is opacification of the T6 intercostal artery on the right. The right T7 intercostal arterial branches demonstrate normal opacification of the muscular branches. The spinal branch also appears normal in caliber. A midline vessel structure is noted most likely representing a portion of the anterior spinal artery. The left T7 intercostal injection demonstrates the spinal branch and the lateral muscular branches to be widely patent. The delayed arterial phase demonstrates a midline structure in the center projecting superiorly and inferiorly. This represents the anterior spinal artery. Again there  is no early opacification or shunting or of a delayed venous structure noted. The right T8 intercostal injection demonstrates normal opacification of the spinal branch and the lateral muscular branches. The left T8 intercostal artery also demonstrates normal opacification of the muscular and also of the medially positioned spinal branch. The delayed arterial phase demonstrates a wisp of anterior spinal artery. The right T10 intercostal injection demonstrates normal opacification of the muscular and the anterior spinal branches. The left T10 intercostal injection demonstrates normal opacification of the muscular branches and of the spinal branches. Projecting medially and arising superiorly is a hairpin turn of vascular structure in the midline and then projecting caudally. This is most consistent  with the artery of Adamkiewicz. No arteriovenous shunting of contrast is noted. There is no delayed presence of a vascular structure. Arising just caudal to this midline vascular structure is another vascular structure rising caudal to the one above. This also projects medially and then appears to make an upward turn. This probably represents the anterior spinal artery also. The dominant hairpin structure was followed caudally to the thoracolumbar region. However, its distal opacification is poorly defined in view of the overlying extensive gas and fecal material in the bowel. The right T11 intercostal injection demonstrates normal opacification of the muscular and the anterior spinal branches. No angiographic evidence of shunting of arteriovenous blood or of a delayed filling of a venous structure is noted in the midline or paramidline region. The left T11 intercostal artery demonstrates normal opacification of the muscular in the anterior spinal branches. There is also filling of the T12 left intercostal. The injection of the T11 left intercostal again demonstrates the presence of yet another hairpin turn or loop of a  vascular structure projecting cranially and then making a turn caudally in the midline. Again this is in the midline and is most consistent with an accessory or a duplicated artery of Adamkiewicz. The right T12 intercostal injection demonstrates normal opacification of the muscular in the anterior spinal branches. Again no evidence of arteriovenous shunting or of a delayed midline vessel structure is noted. The left T12 intercostal injection demonstrates normal opacification of the muscular and the anterior spinal branches. Inferiorly projecting anastomoses are seen with the left lumbar branches. Selective cannulation and arteriograms were performed of the right and left L1 lumbar arteries, the left L2 and transiently the right L2 arteries, the right and left L3 arteries, and the right and left L4 lumbar arteries. These demonstrate normal caliber and flow without evidence of arteriovenous shunting or delayed midline venous structures. Selective angiograms were also performed of the common iliacs which demonstrate no abnormally prominent vascular structures projecting cranially from either side. 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 Caudal extension of the artery of Adamkiewicz arising at T10 from the left intercostal artery followed to the thoracolumbar region. However, overlying gas and fecal material within the intestines precluding further visualization. Findings were discussed with the referring neurologist. Electronically Signed   By: Julieanne Cotton M.D.   On: 06/17/2016 20:22   Ir Angio/spinal Right  Result Date: 06/18/2016 INDICATION: Progressive worsening bilateral lower extremity paraparesis, right greater than left associated with paresthesias and pain.  Abnormal MRI of the the lower thoracic spine. EXAM: BILATERAL COMMON CAROTID AND INNOMINATE ANGIOGRAPHY AND BILATERAL VERTEBRAL ARTERY ANGIOGRAMS. THORACIC AND LUMBAR AORTOGRAMS. COMPLETE SPINAL ANGIOGRAM OF THE CERVICAL, THORACIC AND LUMBAR REGIONS. MEDICATIONS: None. The antibiotic was administered within 1 hour of the procedure ANESTHESIA/SEDATION: General anesthesia. FLUOROSCOPY TIME:  Fluoroscopy Time: 61 minutes 18 seconds (62952 mGy). COMPLICATIONS: None immediate. TECHNIQUE: Informed written consent was obtained from the patient after a thorough discussion of the procedural risks, benefits and alternatives. All questions were addressed. Maximal Sterile Barrier Technique was utilized including caps, mask, sterile gowns, sterile gloves, sterile drape, hand hygiene and skin antiseptic. A timeout was performed prior to the initiation of the procedure. PROCEDURE: The right groin was prepped and draped in the usual sterile fashion. Thereafter using modified Seldinger technique, transfemoral access into the right common femoral artery  was obtained without difficulty. Over a 0.035 inch guidewire, a 5 French Pinnacle sheath was inserted. Through this, and also over 0.035 inch guidewire, a 5 French JB1 catheter was advanced to the aortic arch region and selectively positioned in the right common carotid artery, the right subclavian, the right vertebral artery, the left common carotid artery and the left subclavian artery. A 5 French pigtail catheter was then utilized to perform thoracic and lumbar using the injector device. A 5 [place] Mikaelsson diagnostic catheter was then utilized to cannulate and perform selective injections of the intercostal arteries and the lumbar arteries. FINDINGS: The right common carotid arteriogram demonstrates the right external carotid artery and its major branches to be widely patent. The right internal carotid artery at the bulb to the cranial skull base opacifies normally. The petrous,  the cavernous and the supraclinoid segments demonstrate normal opacification. A right posterior communicating artery is seen opacifying the right posterior cerebral artery and the superior cerebellar artery distributions. Also noted is slightly contralateral posterior cerebral artery. The right middle cerebral artery and the right anterior cerebral artery opacify normally into the capillary and venous phases. The right subclavian arteriogram demonstrates the thyrocervical and the costocervical trunks to be normal in its branches without abnormally prominent blood vessels projecting medially into the spinal canal area. The right vertebral artery origin demonstrates mild stenosis. The vessel is seen to opacify to the cranial skull base. Normal opacification is seen of the right vertebrobasilar junction and the right posterior-inferior cerebellar artery. The opacified portion the basilar artery, the posterior cerebral arteries, the superior cerebellar arteries and the anterior-inferior cerebellar arteries is normal into the delayed arterial phase. Non-opacified blood is seen in the basilar artery from the contralateral vertebral artery. The left vertebral artery origin is normal. The vessel is seen to opacify normally to the cranial skull base. Normal opacification is seen of the left vertebrobasilar junction and the left posterior inferior cerebellar artery distribution. The basilar artery, the posterior cerebral arteries, the superior cerebellar arteries and the anterior-inferior cerebellar arteries opacify normally into the capillary and venous phases. The left subclavian arteriogram demonstrates the costocervical and thyrocervical trunk branches to be normally opacified. The left common carotid arteriogram demonstrates the left external carotid artery and its major branches to be widely patent. The left internal carotid artery at the bulb to the cranial skull base opacifies normally. The petrous, cavernous and the  supraclinoid segments are widely patent. The left middle cerebral artery and the left anterior cerebral artery opacify normally into the capillary and venous phases. A left posterior communicating artery is seen opacifying the left posterior cerebral artery distribution. Constant blush is noted in the region of the middle and the inferior terminus of the left maxillary sinus probably related to inflammation. Opacification of the left superior ophthalmic vein is noted. AORTOGRAMS OF THE THORACIC AND THE ABDOMINAL AORTA The thoracic aorta demonstrates normal opacification from the level of the distal portion of the aortic arch to the level of T12. The visualized intercostal branches from the superior intercostal artery to the level of T12 appear normal in caliber. No abnormal medially directed prominent vasculature is noted. No prominent venous structures are seen on the delayed arterial phase. The abdominal aortogram itself demonstrates normal opacification of the celiac trunk, the renal vasculature bilaterally, and the lower lumbar arteries. The bifurcation of the abdominal aorta into the common iliac arteries appears widely patent. Symmetrical lumbar arteries are noted bilaterally at the level from L3 to L5. Normal renal blush is  noted with clearance of contrast. The visualized superior mesenteric artery and the inferior mesenteric arteries also opacify normally. SPINAL ANGIOGRAM Selective cannulations and angiographic findings were obtained as mentioned below. The right superior intercostal branch is noted arising at the level of T5. This is seen to extend superiorly and laterally to the level of T2 and T3 intercostal branches. Opacification of the bronchial vasculature is also noted from this artery. The selective T5 intercostal injection demonstrates prompt opacification of the muscular and the anterior spinal branches projecting medially. Also noted is anastomosis with the right T4 intercostal branch. There is  flash opacification of the left T5 intercostal artery also. Again no abnormal midline shunting or delayed opacification is noted. Also noted is opacification of the T6 intercostal artery on the right. The right T7 intercostal arterial branches demonstrate normal opacification of the muscular branches. The spinal branch also appears normal in caliber. A midline vessel structure is noted most likely representing a portion of the anterior spinal artery. The left T7 intercostal injection demonstrates the spinal branch and the lateral muscular branches to be widely patent. The delayed arterial phase demonstrates a midline structure in the center projecting superiorly and inferiorly. This represents the anterior spinal artery. Again there is no early opacification or shunting or of a delayed venous structure noted. The right T8 intercostal injection demonstrates normal opacification of the spinal branch and the lateral muscular branches. The left T8 intercostal artery also demonstrates normal opacification of the muscular and also of the medially positioned spinal branch. The delayed arterial phase demonstrates a wisp of anterior spinal artery. The right T10 intercostal injection demonstrates normal opacification of the muscular and the anterior spinal branches. The left T10 intercostal injection demonstrates normal opacification of the muscular branches and of the spinal branches. Projecting medially and arising superiorly is a hairpin turn of vascular structure in the midline and then projecting caudally. This is most consistent with the artery of Adamkiewicz. No arteriovenous shunting of contrast is noted. There is no delayed presence of a vascular structure. Arising just caudal to this midline vascular structure is another vascular structure rising caudal to the one above. This also projects medially and then appears to make an upward turn. This probably represents the anterior spinal artery also. The dominant hairpin  structure was followed caudally to the thoracolumbar region. However, its distal opacification is poorly defined in view of the overlying extensive gas and fecal material in the bowel. The right T11 intercostal injection demonstrates normal opacification of the muscular and the anterior spinal branches. No angiographic evidence of shunting of arteriovenous blood or of a delayed filling of a venous structure is noted in the midline or paramidline region. The left T11 intercostal artery demonstrates normal opacification of the muscular in the anterior spinal branches. There is also filling of the T12 left intercostal. The injection of the T11 left intercostal again demonstrates the presence of yet another hairpin turn or loop of a vascular structure projecting cranially and then making a turn caudally in the midline. Again this is in the midline and is most consistent with an accessory or a duplicated artery of Adamkiewicz. The right T12 intercostal injection demonstrates normal opacification of the muscular in the anterior spinal branches. Again no evidence of arteriovenous shunting or of a delayed midline vessel structure is noted. The left T12 intercostal injection demonstrates normal opacification of the muscular and the anterior spinal branches. Inferiorly projecting anastomoses are seen with the left lumbar branches. Selective cannulation and arteriograms were performed  of the right and left L1 lumbar arteries, the left L2 and transiently the right L2 arteries, the right and left L3 arteries, and the right and left L4 lumbar arteries. These demonstrate normal caliber and flow without evidence of arteriovenous shunting or delayed midline venous structures. Selective angiograms were also performed of the common iliacs which demonstrate no abnormally prominent vascular structures projecting cranially from either side. 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 Caudal extension of the artery of Adamkiewicz arising at T10 from the left intercostal artery followed to the thoracolumbar region. However, overlying gas and fecal material within the intestines precluding further visualization. Findings were discussed with the referring neurologist. Electronically Signed   By: Julieanne Cotton M.D.   On: 06/17/2016 20:22   Ir Angio/spinal Right  Result Date: 06/18/2016 INDICATION: Progressive worsening bilateral lower extremity paraparesis, right greater than left associated with paresthesias and pain. Abnormal MRI of the the lower thoracic spine. EXAM: BILATERAL COMMON CAROTID AND INNOMINATE ANGIOGRAPHY AND BILATERAL VERTEBRAL ARTERY ANGIOGRAMS. THORACIC AND LUMBAR AORTOGRAMS. COMPLETE SPINAL ANGIOGRAM OF THE CERVICAL, THORACIC AND LUMBAR REGIONS. MEDICATIONS: None. The antibiotic was administered within 1 hour of the procedure ANESTHESIA/SEDATION: General anesthesia. FLUOROSCOPY TIME:  Fluoroscopy Time: 61 minutes 18 seconds (16109 mGy). COMPLICATIONS: None immediate. TECHNIQUE: Informed written consent was obtained from the patient after a thorough discussion of the procedural risks, benefits and alternatives. All questions were addressed. Maximal Sterile Barrier Technique was utilized including caps, mask, sterile gowns, sterile gloves, sterile drape, hand hygiene and skin antiseptic. A timeout was performed prior to the initiation of the procedure. PROCEDURE: The right groin was prepped and draped in the usual sterile fashion. Thereafter using modified Seldinger technique, transfemoral access into the right common femoral artery was obtained without difficulty. Over a 0.035 inch guidewire, a 5 French Pinnacle sheath was inserted. Through this, and also over 0.035 inch guidewire, a 5 French  JB1 catheter was advanced to the aortic arch region and selectively positioned in the right common carotid artery, the right subclavian, the right vertebral artery, the left common carotid artery and the left subclavian artery. A 5 French pigtail catheter was then utilized to perform thoracic and lumbar using the injector device. A 5 [place] Mikaelsson diagnostic catheter was then utilized to cannulate and perform selective injections of the intercostal arteries and the lumbar arteries. FINDINGS: The right common carotid arteriogram demonstrates the right external carotid artery and its major branches to be widely patent. The right internal carotid artery at the bulb to the cranial skull base opacifies normally. The petrous, the cavernous and the supraclinoid segments demonstrate normal opacification. A right posterior communicating artery is seen opacifying the right posterior cerebral artery and the superior cerebellar artery distributions. Also noted is slightly contralateral posterior cerebral artery. The right middle cerebral artery and the right anterior cerebral artery opacify normally into the capillary and venous phases. The right subclavian arteriogram demonstrates the thyrocervical and the costocervical trunks to be normal in its branches without abnormally prominent blood vessels projecting medially into the spinal canal area. The right vertebral artery origin demonstrates mild stenosis. The vessel is seen to opacify to the cranial skull base. Normal opacification is seen of the right vertebrobasilar junction and the right posterior-inferior cerebellar artery. The opacified portion the basilar artery, the posterior cerebral arteries, the superior cerebellar arteries  and the anterior-inferior cerebellar arteries is normal into the delayed arterial phase. Non-opacified blood is seen in the basilar artery from the contralateral vertebral artery. The left vertebral artery origin is normal. The vessel is seen  to opacify normally to the cranial skull base. Normal opacification is seen of the left vertebrobasilar junction and the left posterior inferior cerebellar artery distribution. The basilar artery, the posterior cerebral arteries, the superior cerebellar arteries and the anterior-inferior cerebellar arteries opacify normally into the capillary and venous phases. The left subclavian arteriogram demonstrates the costocervical and thyrocervical trunk branches to be normally opacified. The left common carotid arteriogram demonstrates the left external carotid artery and its major branches to be widely patent. The left internal carotid artery at the bulb to the cranial skull base opacifies normally. The petrous, cavernous and the supraclinoid segments are widely patent. The left middle cerebral artery and the left anterior cerebral artery opacify normally into the capillary and venous phases. A left posterior communicating artery is seen opacifying the left posterior cerebral artery distribution. Constant blush is noted in the region of the middle and the inferior terminus of the left maxillary sinus probably related to inflammation. Opacification of the left superior ophthalmic vein is noted. AORTOGRAMS OF THE THORACIC AND THE ABDOMINAL AORTA The thoracic aorta demonstrates normal opacification from the level of the distal portion of the aortic arch to the level of T12. The visualized intercostal branches from the superior intercostal artery to the level of T12 appear normal in caliber. No abnormal medially directed prominent vasculature is noted. No prominent venous structures are seen on the delayed arterial phase. The abdominal aortogram itself demonstrates normal opacification of the celiac trunk, the renal vasculature bilaterally, and the lower lumbar arteries. The bifurcation of the abdominal aorta into the common iliac arteries appears widely patent. Symmetrical lumbar arteries are noted bilaterally at the level  from L3 to L5. Normal renal blush is noted with clearance of contrast. The visualized superior mesenteric artery and the inferior mesenteric arteries also opacify normally. SPINAL ANGIOGRAM Selective cannulations and angiographic findings were obtained as mentioned below. The right superior intercostal branch is noted arising at the level of T5. This is seen to extend superiorly and laterally to the level of T2 and T3 intercostal branches. Opacification of the bronchial vasculature is also noted from this artery. The selective T5 intercostal injection demonstrates prompt opacification of the muscular and the anterior spinal branches projecting medially. Also noted is anastomosis with the right T4 intercostal branch. There is flash opacification of the left T5 intercostal artery also. Again no abnormal midline shunting or delayed opacification is noted. Also noted is opacification of the T6 intercostal artery on the right. The right T7 intercostal arterial branches demonstrate normal opacification of the muscular branches. The spinal branch also appears normal in caliber. A midline vessel structure is noted most likely representing a portion of the anterior spinal artery. The left T7 intercostal injection demonstrates the spinal branch and the lateral muscular branches to be widely patent. The delayed arterial phase demonstrates a midline structure in the center projecting superiorly and inferiorly. This represents the anterior spinal artery. Again there is no early opacification or shunting or of a delayed venous structure noted. The right T8 intercostal injection demonstrates normal opacification of the spinal branch and the lateral muscular branches. The left T8 intercostal artery also demonstrates normal opacification of the muscular and also of the medially positioned spinal branch. The delayed arterial phase demonstrates a wisp of anterior spinal artery.  The right T10 intercostal injection demonstrates normal  opacification of the muscular and the anterior spinal branches. The left T10 intercostal injection demonstrates normal opacification of the muscular branches and of the spinal branches. Projecting medially and arising superiorly is a hairpin turn of vascular structure in the midline and then projecting caudally. This is most consistent with the artery of Adamkiewicz. No arteriovenous shunting of contrast is noted. There is no delayed presence of a vascular structure. Arising just caudal to this midline vascular structure is another vascular structure rising caudal to the one above. This also projects medially and then appears to make an upward turn. This probably represents the anterior spinal artery also. The dominant hairpin structure was followed caudally to the thoracolumbar region. However, its distal opacification is poorly defined in view of the overlying extensive gas and fecal material in the bowel. The right T11 intercostal injection demonstrates normal opacification of the muscular and the anterior spinal branches. No angiographic evidence of shunting of arteriovenous blood or of a delayed filling of a venous structure is noted in the midline or paramidline region. The left T11 intercostal artery demonstrates normal opacification of the muscular in the anterior spinal branches. There is also filling of the T12 left intercostal. The injection of the T11 left intercostal again demonstrates the presence of yet another hairpin turn or loop of a vascular structure projecting cranially and then making a turn caudally in the midline. Again this is in the midline and is most consistent with an accessory or a duplicated artery of Adamkiewicz. The right T12 intercostal injection demonstrates normal opacification of the muscular in the anterior spinal branches. Again no evidence of arteriovenous shunting or of a delayed midline vessel structure is noted. The left T12 intercostal injection demonstrates normal  opacification of the muscular and the anterior spinal branches. Inferiorly projecting anastomoses are seen with the left lumbar branches. Selective cannulation and arteriograms were performed of the right and left L1 lumbar arteries, the left L2 and transiently the right L2 arteries, the right and left L3 arteries, and the right and left L4 lumbar arteries. These demonstrate normal caliber and flow without evidence of arteriovenous shunting or delayed midline venous structures. Selective angiograms were also performed of the common iliacs which demonstrate no abnormally prominent vascular structures projecting cranially from either side. 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 Caudal extension of the artery of Adamkiewicz arising at T10 from the left intercostal artery followed to the thoracolumbar region. However, overlying gas and fecal material within the intestines precluding further visualization. Findings were discussed with the referring neurologist. Electronically Signed   By: Julieanne Cotton M.D.   On: 06/17/2016 20:22   Ir Angio/spinal Right  Result Date: 06/18/2016 INDICATION: Progressive worsening bilateral lower extremity paraparesis, right greater than left associated with paresthesias and pain. Abnormal MRI of the the lower thoracic spine. EXAM: BILATERAL COMMON CAROTID AND INNOMINATE ANGIOGRAPHY AND BILATERAL VERTEBRAL ARTERY ANGIOGRAMS. THORACIC AND LUMBAR AORTOGRAMS. COMPLETE SPINAL ANGIOGRAM OF THE CERVICAL, THORACIC AND LUMBAR REGIONS. MEDICATIONS: None. The antibiotic was administered within 1 hour of the procedure ANESTHESIA/SEDATION: General anesthesia. FLUOROSCOPY TIME:  Fluoroscopy Time: 61 minutes 18 seconds (57846 mGy). COMPLICATIONS: None  immediate. TECHNIQUE: Informed written consent was obtained from the patient after a thorough discussion of the procedural risks, benefits and alternatives. All questions were addressed.  Maximal Sterile Barrier Technique was utilized including caps, mask, sterile gowns, sterile gloves, sterile drape, hand hygiene and skin antiseptic. A timeout was performed prior to the initiation of the procedure. PROCEDURE: The right groin was prepped and draped in the usual sterile fashion. Thereafter using modified Seldinger technique, transfemoral access into the right common femoral artery was obtained without difficulty. Over a 0.035 inch guidewire, a 5 French Pinnacle sheath was inserted. Through this, and also over 0.035 inch guidewire, a 5 French JB1 catheter was advanced to the aortic arch region and selectively positioned in the right common carotid artery, the right subclavian, the right vertebral artery, the left common carotid artery and the left subclavian artery. A 5 French pigtail catheter was then utilized to perform thoracic and lumbar using the injector device. A 5 [place] Mikaelsson diagnostic catheter was then utilized to cannulate and perform selective injections of the intercostal arteries and the lumbar arteries. FINDINGS: The right common carotid arteriogram demonstrates the right external carotid artery and its major branches to be widely patent. The right internal carotid artery at the bulb to the cranial skull base opacifies normally. The petrous, the cavernous and the supraclinoid segments demonstrate normal opacification. A right posterior communicating artery is seen opacifying the right posterior cerebral artery and the superior cerebellar artery distributions. Also noted is slightly contralateral posterior cerebral artery. The right middle cerebral artery and the right anterior cerebral artery opacify normally into the capillary and venous phases. The right subclavian arteriogram demonstrates the  thyrocervical and the costocervical trunks to be normal in its branches without abnormally prominent blood vessels projecting medially into the spinal canal area. The right vertebral artery origin demonstrates mild stenosis. The vessel is seen to opacify to the cranial skull base. Normal opacification is seen of the right vertebrobasilar junction and the right posterior-inferior cerebellar artery. The opacified portion the basilar artery, the posterior cerebral arteries, the superior cerebellar arteries and the anterior-inferior cerebellar arteries is normal into the delayed arterial phase. Non-opacified blood is seen in the basilar artery from the contralateral vertebral artery. The left vertebral artery origin is normal. The vessel is seen to opacify normally to the cranial skull base. Normal opacification is seen of the left vertebrobasilar junction and the left posterior inferior cerebellar artery distribution. The basilar artery, the posterior cerebral arteries, the superior cerebellar arteries and the anterior-inferior cerebellar arteries opacify normally into the capillary and venous phases. The left subclavian arteriogram demonstrates the costocervical and thyrocervical trunk branches to be normally opacified. The left common carotid arteriogram demonstrates the left external carotid artery and its major branches to be widely patent. The left internal carotid artery at the bulb to the cranial skull base opacifies normally. The petrous, cavernous and the supraclinoid segments are widely patent. The left middle cerebral artery and the left anterior cerebral artery opacify normally into the capillary and venous phases. A left posterior communicating artery is seen opacifying the left posterior cerebral artery distribution. Constant blush is noted in the region of the middle and the inferior terminus of the left maxillary sinus probably related to inflammation. Opacification of the left superior ophthalmic vein  is noted. AORTOGRAMS OF THE THORACIC AND THE ABDOMINAL AORTA The thoracic aorta demonstrates normal opacification from the level of the distal portion of the aortic arch to the level of T12. The visualized intercostal branches from the superior intercostal artery to the level of T12 appear normal in caliber. No abnormal medially directed prominent vasculature is noted. No prominent venous structures are  seen on the delayed arterial phase. The abdominal aortogram itself demonstrates normal opacification of the celiac trunk, the renal vasculature bilaterally, and the lower lumbar arteries. The bifurcation of the abdominal aorta into the common iliac arteries appears widely patent. Symmetrical lumbar arteries are noted bilaterally at the level from L3 to L5. Normal renal blush is noted with clearance of contrast. The visualized superior mesenteric artery and the inferior mesenteric arteries also opacify normally. SPINAL ANGIOGRAM Selective cannulations and angiographic findings were obtained as mentioned below. The right superior intercostal branch is noted arising at the level of T5. This is seen to extend superiorly and laterally to the level of T2 and T3 intercostal branches. Opacification of the bronchial vasculature is also noted from this artery. The selective T5 intercostal injection demonstrates prompt opacification of the muscular and the anterior spinal branches projecting medially. Also noted is anastomosis with the right T4 intercostal branch. There is flash opacification of the left T5 intercostal artery also. Again no abnormal midline shunting or delayed opacification is noted. Also noted is opacification of the T6 intercostal artery on the right. The right T7 intercostal arterial branches demonstrate normal opacification of the muscular branches. The spinal branch also appears normal in caliber. A midline vessel structure is noted most likely representing a portion of the anterior spinal artery. The left  T7 intercostal injection demonstrates the spinal branch and the lateral muscular branches to be widely patent. The delayed arterial phase demonstrates a midline structure in the center projecting superiorly and inferiorly. This represents the anterior spinal artery. Again there is no early opacification or shunting or of a delayed venous structure noted. The right T8 intercostal injection demonstrates normal opacification of the spinal branch and the lateral muscular branches. The left T8 intercostal artery also demonstrates normal opacification of the muscular and also of the medially positioned spinal branch. The delayed arterial phase demonstrates a wisp of anterior spinal artery. The right T10 intercostal injection demonstrates normal opacification of the muscular and the anterior spinal branches. The left T10 intercostal injection demonstrates normal opacification of the muscular branches and of the spinal branches. Projecting medially and arising superiorly is a hairpin turn of vascular structure in the midline and then projecting caudally. This is most consistent with the artery of Adamkiewicz. No arteriovenous shunting of contrast is noted. There is no delayed presence of a vascular structure. Arising just caudal to this midline vascular structure is another vascular structure rising caudal to the one above. This also projects medially and then appears to make an upward turn. This probably represents the anterior spinal artery also. The dominant hairpin structure was followed caudally to the thoracolumbar region. However, its distal opacification is poorly defined in view of the overlying extensive gas and fecal material in the bowel. The right T11 intercostal injection demonstrates normal opacification of the muscular and the anterior spinal branches. No angiographic evidence of shunting of arteriovenous blood or of a delayed filling of a venous structure is noted in the midline or paramidline region. The  left T11 intercostal artery demonstrates normal opacification of the muscular in the anterior spinal branches. There is also filling of the T12 left intercostal. The injection of the T11 left intercostal again demonstrates the presence of yet another hairpin turn or loop of a vascular structure projecting cranially and then making a turn caudally in the midline. Again this is in the midline and is most consistent with an accessory or a duplicated artery of Adamkiewicz. The right T12 intercostal injection demonstrates  normal opacification of the muscular in the anterior spinal branches. Again no evidence of arteriovenous shunting or of a delayed midline vessel structure is noted. The left T12 intercostal injection demonstrates normal opacification of the muscular and the anterior spinal branches. Inferiorly projecting anastomoses are seen with the left lumbar branches. Selective cannulation and arteriograms were performed of the right and left L1 lumbar arteries, the left L2 and transiently the right L2 arteries, the right and left L3 arteries, and the right and left L4 lumbar arteries. These demonstrate normal caliber and flow without evidence of arteriovenous shunting or delayed midline venous structures. Selective angiograms were also performed of the common iliacs which demonstrate no abnormally prominent vascular structures projecting cranially from either side. 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 Caudal extension of the artery of Adamkiewicz arising at T10 from the left intercostal artery followed to the thoracolumbar region. However, overlying gas and fecal material within the intestines precluding further visualization. Findings were discussed with the referring  neurologist. Electronically Signed   By: Julieanne Cotton M.D.   On: 06/17/2016 20:22   Ir Angio/spinal Right  Result Date: 06/18/2016 INDICATION: Progressive worsening bilateral lower extremity paraparesis, right greater than left associated with paresthesias and pain. Abnormal MRI of the the lower thoracic spine. EXAM: BILATERAL COMMON CAROTID AND INNOMINATE ANGIOGRAPHY AND BILATERAL VERTEBRAL ARTERY ANGIOGRAMS. THORACIC AND LUMBAR AORTOGRAMS. COMPLETE SPINAL ANGIOGRAM OF THE CERVICAL, THORACIC AND LUMBAR REGIONS. MEDICATIONS: None. The antibiotic was administered within 1 hour of the procedure ANESTHESIA/SEDATION: General anesthesia. FLUOROSCOPY TIME:  Fluoroscopy Time: 61 minutes 18 seconds (09811 mGy). COMPLICATIONS: None immediate. TECHNIQUE: Informed written consent was obtained from the patient after a thorough discussion of the procedural risks, benefits and alternatives. All questions were addressed. Maximal Sterile Barrier Technique was utilized including caps, mask, sterile gowns, sterile gloves, sterile drape, hand hygiene and skin antiseptic. A timeout was performed prior to the initiation of the procedure. PROCEDURE: The right groin was prepped and draped in the usual sterile fashion. Thereafter using modified Seldinger technique, transfemoral access into the right common femoral artery was obtained without difficulty. Over a 0.035 inch guidewire, a 5 French Pinnacle sheath was inserted. Through this, and also over 0.035 inch guidewire, a 5 French JB1 catheter was advanced to the aortic arch region and selectively positioned in the right common carotid artery, the right subclavian, the right vertebral artery, the left common carotid artery and the left subclavian artery. A 5 French pigtail catheter was then utilized to perform thoracic and lumbar using the injector device. A 5 [place] Mikaelsson diagnostic catheter was then utilized to cannulate and perform selective injections of the intercostal  arteries and the lumbar arteries. FINDINGS: The right common carotid arteriogram demonstrates the right external carotid artery and its major branches to be widely patent. The right internal carotid artery at the bulb to the cranial skull base opacifies normally. The petrous, the cavernous and the supraclinoid segments demonstrate normal opacification. A right posterior communicating artery is seen opacifying the right posterior cerebral artery and the superior cerebellar artery distributions. Also noted is slightly contralateral posterior cerebral artery. The right middle cerebral artery and the right anterior cerebral artery opacify normally into the capillary and venous phases. The right subclavian arteriogram demonstrates the thyrocervical and the costocervical trunks to be normal in its branches without abnormally prominent  blood vessels projecting medially into the spinal canal area. The right vertebral artery origin demonstrates mild stenosis. The vessel is seen to opacify to the cranial skull base. Normal opacification is seen of the right vertebrobasilar junction and the right posterior-inferior cerebellar artery. The opacified portion the basilar artery, the posterior cerebral arteries, the superior cerebellar arteries and the anterior-inferior cerebellar arteries is normal into the delayed arterial phase. Non-opacified blood is seen in the basilar artery from the contralateral vertebral artery. The left vertebral artery origin is normal. The vessel is seen to opacify normally to the cranial skull base. Normal opacification is seen of the left vertebrobasilar junction and the left posterior inferior cerebellar artery distribution. The basilar artery, the posterior cerebral arteries, the superior cerebellar arteries and the anterior-inferior cerebellar arteries opacify normally into the capillary and venous phases. The left subclavian arteriogram demonstrates the costocervical and thyrocervical trunk  branches to be normally opacified. The left common carotid arteriogram demonstrates the left external carotid artery and its major branches to be widely patent. The left internal carotid artery at the bulb to the cranial skull base opacifies normally. The petrous, cavernous and the supraclinoid segments are widely patent. The left middle cerebral artery and the left anterior cerebral artery opacify normally into the capillary and venous phases. A left posterior communicating artery is seen opacifying the left posterior cerebral artery distribution. Constant blush is noted in the region of the middle and the inferior terminus of the left maxillary sinus probably related to inflammation. Opacification of the left superior ophthalmic vein is noted. AORTOGRAMS OF THE THORACIC AND THE ABDOMINAL AORTA The thoracic aorta demonstrates normal opacification from the level of the distal portion of the aortic arch to the level of T12. The visualized intercostal branches from the superior intercostal artery to the level of T12 appear normal in caliber. No abnormal medially directed prominent vasculature is noted. No prominent venous structures are seen on the delayed arterial phase. The abdominal aortogram itself demonstrates normal opacification of the celiac trunk, the renal vasculature bilaterally, and the lower lumbar arteries. The bifurcation of the abdominal aorta into the common iliac arteries appears widely patent. Symmetrical lumbar arteries are noted bilaterally at the level from L3 to L5. Normal renal blush is noted with clearance of contrast. The visualized superior mesenteric artery and the inferior mesenteric arteries also opacify normally. SPINAL ANGIOGRAM Selective cannulations and angiographic findings were obtained as mentioned below. The right superior intercostal branch is noted arising at the level of T5. This is seen to extend superiorly and laterally to the level of T2 and T3 intercostal branches.  Opacification of the bronchial vasculature is also noted from this artery. The selective T5 intercostal injection demonstrates prompt opacification of the muscular and the anterior spinal branches projecting medially. Also noted is anastomosis with the right T4 intercostal branch. There is flash opacification of the left T5 intercostal artery also. Again no abnormal midline shunting or delayed opacification is noted. Also noted is opacification of the T6 intercostal artery on the right. The right T7 intercostal arterial branches demonstrate normal opacification of the muscular branches. The spinal branch also appears normal in caliber. A midline vessel structure is noted most likely representing a portion of the anterior spinal artery. The left T7 intercostal injection demonstrates the spinal branch and the lateral muscular branches to be widely patent. The delayed arterial phase demonstrates a midline structure in the center projecting superiorly and inferiorly. This represents the anterior spinal artery. Again there is no early opacification  or shunting or of a delayed venous structure noted. The right T8 intercostal injection demonstrates normal opacification of the spinal branch and the lateral muscular branches. The left T8 intercostal artery also demonstrates normal opacification of the muscular and also of the medially positioned spinal branch. The delayed arterial phase demonstrates a wisp of anterior spinal artery. The right T10 intercostal injection demonstrates normal opacification of the muscular and the anterior spinal branches. The left T10 intercostal injection demonstrates normal opacification of the muscular branches and of the spinal branches. Projecting medially and arising superiorly is a hairpin turn of vascular structure in the midline and then projecting caudally. This is most consistent with the artery of Adamkiewicz. No arteriovenous shunting of contrast is noted. There is no delayed  presence of a vascular structure. Arising just caudal to this midline vascular structure is another vascular structure rising caudal to the one above. This also projects medially and then appears to make an upward turn. This probably represents the anterior spinal artery also. The dominant hairpin structure was followed caudally to the thoracolumbar region. However, its distal opacification is poorly defined in view of the overlying extensive gas and fecal material in the bowel. The right T11 intercostal injection demonstrates normal opacification of the muscular and the anterior spinal branches. No angiographic evidence of shunting of arteriovenous blood or of a delayed filling of a venous structure is noted in the midline or paramidline region. The left T11 intercostal artery demonstrates normal opacification of the muscular in the anterior spinal branches. There is also filling of the T12 left intercostal. The injection of the T11 left intercostal again demonstrates the presence of yet another hairpin turn or loop of a vascular structure projecting cranially and then making a turn caudally in the midline. Again this is in the midline and is most consistent with an accessory or a duplicated artery of Adamkiewicz. The right T12 intercostal injection demonstrates normal opacification of the muscular in the anterior spinal branches. Again no evidence of arteriovenous shunting or of a delayed midline vessel structure is noted. The left T12 intercostal injection demonstrates normal opacification of the muscular and the anterior spinal branches. Inferiorly projecting anastomoses are seen with the left lumbar branches. Selective cannulation and arteriograms were performed of the right and left L1 lumbar arteries, the left L2 and transiently the right L2 arteries, the right and left L3 arteries, and the right and left L4 lumbar arteries. These demonstrate normal caliber and flow without evidence of arteriovenous shunting  or delayed midline venous structures. Selective angiograms were also performed of the common iliacs which demonstrate no abnormally prominent vascular structures projecting cranially from either side. 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 Caudal extension of the artery of Adamkiewicz arising at T10 from the left intercostal artery followed to the thoracolumbar region. However, overlying gas and fecal material within the intestines precluding further visualization. Findings were discussed with the referring neurologist. Electronically Signed   By: Julieanne Cotton M.D.   On: 06/17/2016 20:22   Ir Angio/spinal Right  Result Date: 06/18/2016 INDICATION: Progressive worsening bilateral lower extremity paraparesis, right greater than left associated with paresthesias and pain. Abnormal MRI of the the lower thoracic spine. EXAM: BILATERAL COMMON CAROTID AND INNOMINATE ANGIOGRAPHY AND BILATERAL VERTEBRAL ARTERY ANGIOGRAMS. THORACIC AND LUMBAR AORTOGRAMS. COMPLETE SPINAL ANGIOGRAM OF THE CERVICAL, THORACIC  AND LUMBAR REGIONS. MEDICATIONS: None. The antibiotic was administered within 1 hour of the procedure ANESTHESIA/SEDATION: General anesthesia. FLUOROSCOPY TIME:  Fluoroscopy Time: 61 minutes 18 seconds (16109 mGy). COMPLICATIONS: None immediate. TECHNIQUE: Informed written consent was obtained from the patient after a thorough discussion of the procedural risks, benefits and alternatives. All questions were addressed. Maximal Sterile Barrier Technique was utilized including caps, mask, sterile gowns, sterile gloves, sterile drape, hand hygiene and skin antiseptic. A timeout was performed prior to the initiation of the procedure. PROCEDURE: The right groin was prepped and draped  in the usual sterile fashion. Thereafter using modified Seldinger technique, transfemoral access into the right common femoral artery was obtained without difficulty. Over a 0.035 inch guidewire, a 5 French Pinnacle sheath was inserted. Through this, and also over 0.035 inch guidewire, a 5 French JB1 catheter was advanced to the aortic arch region and selectively positioned in the right common carotid artery, the right subclavian, the right vertebral artery, the left common carotid artery and the left subclavian artery. A 5 French pigtail catheter was then utilized to perform thoracic and lumbar using the injector device. A 5 [place] Mikaelsson diagnostic catheter was then utilized to cannulate and perform selective injections of the intercostal arteries and the lumbar arteries. FINDINGS: The right common carotid arteriogram demonstrates the right external carotid artery and its major branches to be widely patent. The right internal carotid artery at the bulb to the cranial skull base opacifies normally. The petrous, the cavernous and the supraclinoid segments demonstrate normal opacification. A right posterior communicating artery is seen opacifying the right posterior cerebral artery and the superior cerebellar artery distributions. Also noted is slightly contralateral posterior cerebral artery. The right middle cerebral artery and the right anterior cerebral artery opacify normally into the capillary and venous phases. The right subclavian arteriogram demonstrates the thyrocervical and the costocervical trunks to be normal in its branches without abnormally prominent blood vessels projecting medially into the spinal canal area. The right vertebral artery origin demonstrates mild stenosis. The vessel is seen to opacify to the cranial skull base. Normal opacification is seen of the right vertebrobasilar junction and the right posterior-inferior cerebellar artery. The opacified portion the basilar artery, the  posterior cerebral arteries, the superior cerebellar arteries and the anterior-inferior cerebellar arteries is normal into the delayed arterial phase. Non-opacified blood is seen in the basilar artery from the contralateral vertebral artery. The left vertebral artery origin is normal. The vessel is seen to opacify normally to the cranial skull base. Normal opacification is seen of the left vertebrobasilar junction and the left posterior inferior cerebellar artery distribution. The basilar artery, the posterior cerebral arteries, the superior cerebellar arteries and the anterior-inferior cerebellar arteries opacify normally into the capillary and venous phases. The left subclavian arteriogram demonstrates the costocervical and thyrocervical trunk branches to be normally opacified. The left common carotid arteriogram demonstrates the left external carotid artery and its major branches to be widely patent. The left internal carotid artery at the bulb to the cranial skull base opacifies normally. The petrous, cavernous and the supraclinoid segments are widely patent. The left middle cerebral artery and the left anterior cerebral artery opacify normally into the capillary and venous phases. A left posterior communicating artery is seen opacifying the left posterior cerebral artery distribution. Constant blush is noted in the region of the middle and the inferior terminus of the left maxillary sinus probably related to inflammation. Opacification of the left superior ophthalmic vein is noted. AORTOGRAMS OF THE THORACIC AND  THE ABDOMINAL AORTA The thoracic aorta demonstrates normal opacification from the level of the distal portion of the aortic arch to the level of T12. The visualized intercostal branches from the superior intercostal artery to the level of T12 appear normal in caliber. No abnormal medially directed prominent vasculature is noted. No prominent venous structures are seen on the delayed arterial phase. The  abdominal aortogram itself demonstrates normal opacification of the celiac trunk, the renal vasculature bilaterally, and the lower lumbar arteries. The bifurcation of the abdominal aorta into the common iliac arteries appears widely patent. Symmetrical lumbar arteries are noted bilaterally at the level from L3 to L5. Normal renal blush is noted with clearance of contrast. The visualized superior mesenteric artery and the inferior mesenteric arteries also opacify normally. SPINAL ANGIOGRAM Selective cannulations and angiographic findings were obtained as mentioned below. The right superior intercostal branch is noted arising at the level of T5. This is seen to extend superiorly and laterally to the level of T2 and T3 intercostal branches. Opacification of the bronchial vasculature is also noted from this artery. The selective T5 intercostal injection demonstrates prompt opacification of the muscular and the anterior spinal branches projecting medially. Also noted is anastomosis with the right T4 intercostal branch. There is flash opacification of the left T5 intercostal artery also. Again no abnormal midline shunting or delayed opacification is noted. Also noted is opacification of the T6 intercostal artery on the right. The right T7 intercostal arterial branches demonstrate normal opacification of the muscular branches. The spinal branch also appears normal in caliber. A midline vessel structure is noted most likely representing a portion of the anterior spinal artery. The left T7 intercostal injection demonstrates the spinal branch and the lateral muscular branches to be widely patent. The delayed arterial phase demonstrates a midline structure in the center projecting superiorly and inferiorly. This represents the anterior spinal artery. Again there is no early opacification or shunting or of a delayed venous structure noted. The right T8 intercostal injection demonstrates normal opacification of the spinal branch  and the lateral muscular branches. The left T8 intercostal artery also demonstrates normal opacification of the muscular and also of the medially positioned spinal branch. The delayed arterial phase demonstrates a wisp of anterior spinal artery. The right T10 intercostal injection demonstrates normal opacification of the muscular and the anterior spinal branches. The left T10 intercostal injection demonstrates normal opacification of the muscular branches and of the spinal branches. Projecting medially and arising superiorly is a hairpin turn of vascular structure in the midline and then projecting caudally. This is most consistent with the artery of Adamkiewicz. No arteriovenous shunting of contrast is noted. There is no delayed presence of a vascular structure. Arising just caudal to this midline vascular structure is another vascular structure rising caudal to the one above. This also projects medially and then appears to make an upward turn. This probably represents the anterior spinal artery also. The dominant hairpin structure was followed caudally to the thoracolumbar region. However, its distal opacification is poorly defined in view of the overlying extensive gas and fecal material in the bowel. The right T11 intercostal injection demonstrates normal opacification of the muscular and the anterior spinal branches. No angiographic evidence of shunting of arteriovenous blood or of a delayed filling of a venous structure is noted in the midline or paramidline region. The left T11 intercostal artery demonstrates normal opacification of the muscular in the anterior spinal branches. There is also filling of the T12 left intercostal. The injection  of the T11 left intercostal again demonstrates the presence of yet another hairpin turn or loop of a vascular structure projecting cranially and then making a turn caudally in the midline. Again this is in the midline and is most consistent with an accessory or a  duplicated artery of Adamkiewicz. The right T12 intercostal injection demonstrates normal opacification of the muscular in the anterior spinal branches. Again no evidence of arteriovenous shunting or of a delayed midline vessel structure is noted. The left T12 intercostal injection demonstrates normal opacification of the muscular and the anterior spinal branches. Inferiorly projecting anastomoses are seen with the left lumbar branches. Selective cannulation and arteriograms were performed of the right and left L1 lumbar arteries, the left L2 and transiently the right L2 arteries, the right and left L3 arteries, and the right and left L4 lumbar arteries. These demonstrate normal caliber and flow without evidence of arteriovenous shunting or delayed midline venous structures. Selective angiograms were also performed of the common iliacs which demonstrate no abnormally prominent vascular structures projecting cranially from either side. 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 Caudal extension of the artery of Adamkiewicz arising at T10 from the left intercostal artery followed to the thoracolumbar region. However, overlying gas and fecal material within the intestines precluding further visualization. Findings were discussed with the referring neurologist. Electronically Signed   By: Julieanne Cotton M.D.   On: 06/17/2016 20:22   Ir Angio/spinal Right  Result Date: 06/18/2016 INDICATION: Progressive worsening bilateral lower extremity paraparesis, right greater than left associated with paresthesias and pain. Abnormal MRI of the the lower thoracic spine. EXAM: BILATERAL COMMON CAROTID AND INNOMINATE ANGIOGRAPHY AND BILATERAL VERTEBRAL ARTERY ANGIOGRAMS. THORACIC AND LUMBAR  AORTOGRAMS. COMPLETE SPINAL ANGIOGRAM OF THE CERVICAL, THORACIC AND LUMBAR REGIONS. MEDICATIONS: None. The antibiotic was administered within 1 hour of the procedure ANESTHESIA/SEDATION: General anesthesia. FLUOROSCOPY TIME:  Fluoroscopy Time: 61 minutes 18 seconds (47829 mGy). COMPLICATIONS: None immediate. TECHNIQUE: Informed written consent was obtained from the patient after a thorough discussion of the procedural risks, benefits and alternatives. All questions were addressed. Maximal Sterile Barrier Technique was utilized including caps, mask, sterile gowns, sterile gloves, sterile drape, hand hygiene and skin antiseptic. A timeout was performed prior to the initiation of the procedure. PROCEDURE: The right groin was prepped and draped in the usual sterile fashion. Thereafter using modified Seldinger technique, transfemoral access into the right common femoral artery was obtained without difficulty. Over a 0.035 inch guidewire, a 5 French Pinnacle sheath was inserted. Through this, and also over 0.035 inch guidewire, a 5 French JB1 catheter was advanced to the aortic arch region and selectively positioned in the right common carotid artery, the right subclavian, the right vertebral artery, the left common carotid artery and the left subclavian artery. A 5 French pigtail catheter was then utilized to perform thoracic and lumbar using the injector device. A 5 [place] Mikaelsson diagnostic catheter was then utilized to cannulate and perform selective injections of the intercostal arteries and the lumbar arteries. FINDINGS: The right common carotid arteriogram demonstrates the right external carotid artery and its major branches to be widely patent. The right internal carotid artery at the bulb to the cranial skull base opacifies normally. The petrous, the cavernous and the supraclinoid segments demonstrate normal opacification. A right posterior communicating artery is seen opacifying the right  posterior cerebral  artery and the superior cerebellar artery distributions. Also noted is slightly contralateral posterior cerebral artery. The right middle cerebral artery and the right anterior cerebral artery opacify normally into the capillary and venous phases. The right subclavian arteriogram demonstrates the thyrocervical and the costocervical trunks to be normal in its branches without abnormally prominent blood vessels projecting medially into the spinal canal area. The right vertebral artery origin demonstrates mild stenosis. The vessel is seen to opacify to the cranial skull base. Normal opacification is seen of the right vertebrobasilar junction and the right posterior-inferior cerebellar artery. The opacified portion the basilar artery, the posterior cerebral arteries, the superior cerebellar arteries and the anterior-inferior cerebellar arteries is normal into the delayed arterial phase. Non-opacified blood is seen in the basilar artery from the contralateral vertebral artery. The left vertebral artery origin is normal. The vessel is seen to opacify normally to the cranial skull base. Normal opacification is seen of the left vertebrobasilar junction and the left posterior inferior cerebellar artery distribution. The basilar artery, the posterior cerebral arteries, the superior cerebellar arteries and the anterior-inferior cerebellar arteries opacify normally into the capillary and venous phases. The left subclavian arteriogram demonstrates the costocervical and thyrocervical trunk branches to be normally opacified. The left common carotid arteriogram demonstrates the left external carotid artery and its major branches to be widely patent. The left internal carotid artery at the bulb to the cranial skull base opacifies normally. The petrous, cavernous and the supraclinoid segments are widely patent. The left middle cerebral artery and the left anterior cerebral artery opacify normally into the capillary and venous phases.  A left posterior communicating artery is seen opacifying the left posterior cerebral artery distribution. Constant blush is noted in the region of the middle and the inferior terminus of the left maxillary sinus probably related to inflammation. Opacification of the left superior ophthalmic vein is noted. AORTOGRAMS OF THE THORACIC AND THE ABDOMINAL AORTA The thoracic aorta demonstrates normal opacification from the level of the distal portion of the aortic arch to the level of T12. The visualized intercostal branches from the superior intercostal artery to the level of T12 appear normal in caliber. No abnormal medially directed prominent vasculature is noted. No prominent venous structures are seen on the delayed arterial phase. The abdominal aortogram itself demonstrates normal opacification of the celiac trunk, the renal vasculature bilaterally, and the lower lumbar arteries. The bifurcation of the abdominal aorta into the common iliac arteries appears widely patent. Symmetrical lumbar arteries are noted bilaterally at the level from L3 to L5. Normal renal blush is noted with clearance of contrast. The visualized superior mesenteric artery and the inferior mesenteric arteries also opacify normally. SPINAL ANGIOGRAM Selective cannulations and angiographic findings were obtained as mentioned below. The right superior intercostal branch is noted arising at the level of T5. This is seen to extend superiorly and laterally to the level of T2 and T3 intercostal branches. Opacification of the bronchial vasculature is also noted from this artery. The selective T5 intercostal injection demonstrates prompt opacification of the muscular and the anterior spinal branches projecting medially. Also noted is anastomosis with the right T4 intercostal branch. There is flash opacification of the left T5 intercostal artery also. Again no abnormal midline shunting or delayed opacification is noted. Also noted is opacification of the  T6 intercostal artery on the right. The right T7 intercostal arterial branches demonstrate normal opacification of the muscular branches. The spinal branch also appears normal in caliber. A midline vessel structure  is noted most likely representing a portion of the anterior spinal artery. The left T7 intercostal injection demonstrates the spinal branch and the lateral muscular branches to be widely patent. The delayed arterial phase demonstrates a midline structure in the center projecting superiorly and inferiorly. This represents the anterior spinal artery. Again there is no early opacification or shunting or of a delayed venous structure noted. The right T8 intercostal injection demonstrates normal opacification of the spinal branch and the lateral muscular branches. The left T8 intercostal artery also demonstrates normal opacification of the muscular and also of the medially positioned spinal branch. The delayed arterial phase demonstrates a wisp of anterior spinal artery. The right T10 intercostal injection demonstrates normal opacification of the muscular and the anterior spinal branches. The left T10 intercostal injection demonstrates normal opacification of the muscular branches and of the spinal branches. Projecting medially and arising superiorly is a hairpin turn of vascular structure in the midline and then projecting caudally. This is most consistent with the artery of Adamkiewicz. No arteriovenous shunting of contrast is noted. There is no delayed presence of a vascular structure. Arising just caudal to this midline vascular structure is another vascular structure rising caudal to the one above. This also projects medially and then appears to make an upward turn. This probably represents the anterior spinal artery also. The dominant hairpin structure was followed caudally to the thoracolumbar region. However, its distal opacification is poorly defined in view of the overlying extensive gas and fecal  material in the bowel. The right T11 intercostal injection demonstrates normal opacification of the muscular and the anterior spinal branches. No angiographic evidence of shunting of arteriovenous blood or of a delayed filling of a venous structure is noted in the midline or paramidline region. The left T11 intercostal artery demonstrates normal opacification of the muscular in the anterior spinal branches. There is also filling of the T12 left intercostal. The injection of the T11 left intercostal again demonstrates the presence of yet another hairpin turn or loop of a vascular structure projecting cranially and then making a turn caudally in the midline. Again this is in the midline and is most consistent with an accessory or a duplicated artery of Adamkiewicz. The right T12 intercostal injection demonstrates normal opacification of the muscular in the anterior spinal branches. Again no evidence of arteriovenous shunting or of a delayed midline vessel structure is noted. The left T12 intercostal injection demonstrates normal opacification of the muscular and the anterior spinal branches. Inferiorly projecting anastomoses are seen with the left lumbar branches. Selective cannulation and arteriograms were performed of the right and left L1 lumbar arteries, the left L2 and transiently the right L2 arteries, the right and left L3 arteries, and the right and left L4 lumbar arteries. These demonstrate normal caliber and flow without evidence of arteriovenous shunting or delayed midline venous structures. Selective angiograms were also performed of the common iliacs which demonstrate no abnormally prominent vascular structures projecting cranially from either side. 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 Caudal extension of the artery of Adamkiewicz arising at T10 from the left intercostal artery followed to the thoracolumbar region. However, overlying gas and fecal material within the intestines precluding further visualization. Findings were discussed with the referring neurologist. Electronically Signed   By: Julieanne CottonSanjeev  Deveshwar M.D.   On: 06/17/2016  20:22   Ir Angio/spinal Right  Result Date: 06/18/2016 INDICATION: Progressive worsening bilateral lower extremity paraparesis, right greater than left associated with paresthesias and pain. Abnormal MRI of the the lower thoracic spine. EXAM: BILATERAL COMMON CAROTID AND INNOMINATE ANGIOGRAPHY AND BILATERAL VERTEBRAL ARTERY ANGIOGRAMS. THORACIC AND LUMBAR AORTOGRAMS. COMPLETE SPINAL ANGIOGRAM OF THE CERVICAL, THORACIC AND LUMBAR REGIONS. MEDICATIONS: None. The antibiotic was administered within 1 hour of the procedure ANESTHESIA/SEDATION: General anesthesia. FLUOROSCOPY TIME:  Fluoroscopy Time: 61 minutes 18 seconds (16109 mGy). COMPLICATIONS: None immediate. TECHNIQUE: Informed written consent was obtained from the patient after a thorough discussion of the procedural risks, benefits and alternatives. All questions were addressed. Maximal Sterile Barrier Technique was utilized including caps, mask, sterile gowns, sterile gloves, sterile drape, hand hygiene and skin antiseptic. A timeout was performed prior to the initiation of the procedure. PROCEDURE: The right groin was prepped and draped in the usual sterile fashion. Thereafter using modified Seldinger technique, transfemoral access into the right common femoral artery was obtained without difficulty. Over a 0.035 inch guidewire, a 5 French Pinnacle sheath was inserted. Through this, and also over 0.035 inch guidewire, a 5 French JB1 catheter was advanced to the aortic arch region and selectively positioned in the right common carotid artery, the right subclavian, the right vertebral  artery, the left common carotid artery and the left subclavian artery. A 5 French pigtail catheter was then utilized to perform thoracic and lumbar using the injector device. A 5 [place] Mikaelsson diagnostic catheter was then utilized to cannulate and perform selective injections of the intercostal arteries and the lumbar arteries. FINDINGS: The right common carotid arteriogram demonstrates the right external carotid artery and its major branches to be widely patent. The right internal carotid artery at the bulb to the cranial skull base opacifies normally. The petrous, the cavernous and the supraclinoid segments demonstrate normal opacification. A right posterior communicating artery is seen opacifying the right posterior cerebral artery and the superior cerebellar artery distributions. Also noted is slightly contralateral posterior cerebral artery. The right middle cerebral artery and the right anterior cerebral artery opacify normally into the capillary and venous phases. The right subclavian arteriogram demonstrates the thyrocervical and the costocervical trunks to be normal in its branches without abnormally prominent blood vessels projecting medially into the spinal canal area. The right vertebral artery origin demonstrates mild stenosis. The vessel is seen to opacify to the cranial skull base. Normal opacification is seen of the right vertebrobasilar junction and the right posterior-inferior cerebellar artery. The opacified portion the basilar artery, the posterior cerebral arteries, the superior cerebellar arteries and the anterior-inferior cerebellar arteries is normal into the delayed arterial phase. Non-opacified blood is seen in the basilar artery from the contralateral vertebral artery. The left vertebral artery origin is normal. The vessel is seen to opacify normally to the cranial skull base. Normal opacification is seen of the left vertebrobasilar junction and the left posterior inferior cerebellar  artery distribution. The basilar artery, the posterior cerebral arteries, the superior cerebellar arteries and the anterior-inferior cerebellar arteries opacify normally into the capillary and venous phases. The left subclavian arteriogram demonstrates the costocervical and thyrocervical trunk branches to be normally opacified. The left common carotid arteriogram demonstrates the left external carotid artery and its major branches to be widely patent. The left internal carotid artery at the bulb to the cranial skull base opacifies normally. The petrous, cavernous and the supraclinoid segments are widely patent. The left middle cerebral artery and the left anterior cerebral artery opacify normally  into the capillary and venous phases. A left posterior communicating artery is seen opacifying the left posterior cerebral artery distribution. Constant blush is noted in the region of the middle and the inferior terminus of the left maxillary sinus probably related to inflammation. Opacification of the left superior ophthalmic vein is noted. AORTOGRAMS OF THE THORACIC AND THE ABDOMINAL AORTA The thoracic aorta demonstrates normal opacification from the level of the distal portion of the aortic arch to the level of T12. The visualized intercostal branches from the superior intercostal artery to the level of T12 appear normal in caliber. No abnormal medially directed prominent vasculature is noted. No prominent venous structures are seen on the delayed arterial phase. The abdominal aortogram itself demonstrates normal opacification of the celiac trunk, the renal vasculature bilaterally, and the lower lumbar arteries. The bifurcation of the abdominal aorta into the common iliac arteries appears widely patent. Symmetrical lumbar arteries are noted bilaterally at the level from L3 to L5. Normal renal blush is noted with clearance of contrast. The visualized superior mesenteric artery and the inferior mesenteric arteries also  opacify normally. SPINAL ANGIOGRAM Selective cannulations and angiographic findings were obtained as mentioned below. The right superior intercostal branch is noted arising at the level of T5. This is seen to extend superiorly and laterally to the level of T2 and T3 intercostal branches. Opacification of the bronchial vasculature is also noted from this artery. The selective T5 intercostal injection demonstrates prompt opacification of the muscular and the anterior spinal branches projecting medially. Also noted is anastomosis with the right T4 intercostal branch. There is flash opacification of the left T5 intercostal artery also. Again no abnormal midline shunting or delayed opacification is noted. Also noted is opacification of the T6 intercostal artery on the right. The right T7 intercostal arterial branches demonstrate normal opacification of the muscular branches. The spinal branch also appears normal in caliber. A midline vessel structure is noted most likely representing a portion of the anterior spinal artery. The left T7 intercostal injection demonstrates the spinal branch and the lateral muscular branches to be widely patent. The delayed arterial phase demonstrates a midline structure in the center projecting superiorly and inferiorly. This represents the anterior spinal artery. Again there is no early opacification or shunting or of a delayed venous structure noted. The right T8 intercostal injection demonstrates normal opacification of the spinal branch and the lateral muscular branches. The left T8 intercostal artery also demonstrates normal opacification of the muscular and also of the medially positioned spinal branch. The delayed arterial phase demonstrates a wisp of anterior spinal artery. The right T10 intercostal injection demonstrates normal opacification of the muscular and the anterior spinal branches. The left T10 intercostal injection demonstrates normal opacification of the muscular  branches and of the spinal branches. Projecting medially and arising superiorly is a hairpin turn of vascular structure in the midline and then projecting caudally. This is most consistent with the artery of Adamkiewicz. No arteriovenous shunting of contrast is noted. There is no delayed presence of a vascular structure. Arising just caudal to this midline vascular structure is another vascular structure rising caudal to the one above. This also projects medially and then appears to make an upward turn. This probably represents the anterior spinal artery also. The dominant hairpin structure was followed caudally to the thoracolumbar region. However, its distal opacification is poorly defined in view of the overlying extensive gas and fecal material in the bowel. The right T11 intercostal injection demonstrates normal opacification of the muscular  and the anterior spinal branches. No angiographic evidence of shunting of arteriovenous blood or of a delayed filling of a venous structure is noted in the midline or paramidline region. The left T11 intercostal artery demonstrates normal opacification of the muscular in the anterior spinal branches. There is also filling of the T12 left intercostal. The injection of the T11 left intercostal again demonstrates the presence of yet another hairpin turn or loop of a vascular structure projecting cranially and then making a turn caudally in the midline. Again this is in the midline and is most consistent with an accessory or a duplicated artery of Adamkiewicz. The right T12 intercostal injection demonstrates normal opacification of the muscular in the anterior spinal branches. Again no evidence of arteriovenous shunting or of a delayed midline vessel structure is noted. The left T12 intercostal injection demonstrates normal opacification of the muscular and the anterior spinal branches. Inferiorly projecting anastomoses are seen with the left lumbar branches. Selective  cannulation and arteriograms were performed of the right and left L1 lumbar arteries, the left L2 and transiently the right L2 arteries, the right and left L3 arteries, and the right and left L4 lumbar arteries. These demonstrate normal caliber and flow without evidence of arteriovenous shunting or delayed midline venous structures. Selective angiograms were also performed of the common iliacs which demonstrate no abnormally prominent vascular structures projecting cranially from either side. 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 Caudal extension of the artery of Adamkiewicz arising at T10 from the left intercostal artery followed to the thoracolumbar region. However, overlying gas and fecal material within the intestines precluding further visualization. Findings were discussed with the referring neurologist. Electronically Signed   By: Julieanne Cotton M.D.   On: 06/17/2016 20:22   Ir Angio/spinal Right  Result Date: 06/18/2016 INDICATION: Progressive worsening bilateral lower extremity paraparesis, right greater than left associated with paresthesias and pain. Abnormal MRI of the the lower thoracic spine. EXAM: BILATERAL COMMON CAROTID AND INNOMINATE ANGIOGRAPHY AND BILATERAL VERTEBRAL ARTERY ANGIOGRAMS. THORACIC AND LUMBAR AORTOGRAMS. COMPLETE SPINAL ANGIOGRAM OF THE CERVICAL, THORACIC AND LUMBAR REGIONS. MEDICATIONS: None. The antibiotic was administered within 1 hour of the procedure ANESTHESIA/SEDATION: General anesthesia. FLUOROSCOPY TIME:  Fluoroscopy Time: 61 minutes 18 seconds (16109 mGy). COMPLICATIONS: None immediate. TECHNIQUE: Informed written consent was obtained from the patient after a thorough discussion of the procedural risks, benefits and  alternatives. All questions were addressed. Maximal Sterile Barrier Technique was utilized including caps, mask, sterile gowns, sterile gloves, sterile drape, hand hygiene and skin antiseptic. A timeout was performed prior to the initiation of the procedure. PROCEDURE: The right groin was prepped and draped in the usual sterile fashion. Thereafter using modified Seldinger technique, transfemoral access into the right common femoral artery was obtained without difficulty. Over a 0.035 inch guidewire, a 5 French Pinnacle sheath was inserted. Through this, and also over 0.035 inch guidewire, a 5 French JB1 catheter was advanced to the aortic arch region and selectively positioned in the right common carotid artery, the right subclavian, the right vertebral artery, the left common carotid artery and the left subclavian artery. A 5 French pigtail catheter was then utilized to perform thoracic and lumbar using the injector device. A 5 [place] Mikaelsson diagnostic catheter was then utilized to cannulate and perform selective injections of the intercostal arteries and the lumbar  arteries. FINDINGS: The right common carotid arteriogram demonstrates the right external carotid artery and its major branches to be widely patent. The right internal carotid artery at the bulb to the cranial skull base opacifies normally. The petrous, the cavernous and the supraclinoid segments demonstrate normal opacification. A right posterior communicating artery is seen opacifying the right posterior cerebral artery and the superior cerebellar artery distributions. Also noted is slightly contralateral posterior cerebral artery. The right middle cerebral artery and the right anterior cerebral artery opacify normally into the capillary and venous phases. The right subclavian arteriogram demonstrates the thyrocervical and the costocervical trunks to be normal in its branches without abnormally prominent blood vessels projecting medially into the  spinal canal area. The right vertebral artery origin demonstrates mild stenosis. The vessel is seen to opacify to the cranial skull base. Normal opacification is seen of the right vertebrobasilar junction and the right posterior-inferior cerebellar artery. The opacified portion the basilar artery, the posterior cerebral arteries, the superior cerebellar arteries and the anterior-inferior cerebellar arteries is normal into the delayed arterial phase. Non-opacified blood is seen in the basilar artery from the contralateral vertebral artery. The left vertebral artery origin is normal. The vessel is seen to opacify normally to the cranial skull base. Normal opacification is seen of the left vertebrobasilar junction and the left posterior inferior cerebellar artery distribution. The basilar artery, the posterior cerebral arteries, the superior cerebellar arteries and the anterior-inferior cerebellar arteries opacify normally into the capillary and venous phases. The left subclavian arteriogram demonstrates the costocervical and thyrocervical trunk branches to be normally opacified. The left common carotid arteriogram demonstrates the left external carotid artery and its major branches to be widely patent. The left internal carotid artery at the bulb to the cranial skull base opacifies normally. The petrous, cavernous and the supraclinoid segments are widely patent. The left middle cerebral artery and the left anterior cerebral artery opacify normally into the capillary and venous phases. A left posterior communicating artery is seen opacifying the left posterior cerebral artery distribution. Constant blush is noted in the region of the middle and the inferior terminus of the left maxillary sinus probably related to inflammation. Opacification of the left superior ophthalmic vein is noted. AORTOGRAMS OF THE THORACIC AND THE ABDOMINAL AORTA The thoracic aorta demonstrates normal opacification from the level of the distal  portion of the aortic arch to the level of T12. The visualized intercostal branches from the superior intercostal artery to the level of T12 appear normal in caliber. No abnormal medially directed prominent vasculature is noted. No prominent venous structures are seen on the delayed arterial phase. The abdominal aortogram itself demonstrates normal opacification of the celiac trunk, the renal vasculature bilaterally, and the lower lumbar arteries. The bifurcation of the abdominal aorta into the common iliac arteries appears widely patent. Symmetrical lumbar arteries are noted bilaterally at the level from L3 to L5. Normal renal blush is noted with clearance of contrast. The visualized superior mesenteric artery and the inferior mesenteric arteries also opacify normally. SPINAL ANGIOGRAM Selective cannulations and angiographic findings were obtained as mentioned below. The right superior intercostal branch is noted arising at the level of T5. This is seen to extend superiorly and laterally to the level of T2 and T3 intercostal branches. Opacification of the bronchial vasculature is also noted from this artery. The selective T5 intercostal injection demonstrates prompt opacification of the muscular and the anterior spinal branches projecting medially. Also noted is anastomosis with the right T4 intercostal branch. There is  flash opacification of the left T5 intercostal artery also. Again no abnormal midline shunting or delayed opacification is noted. Also noted is opacification of the T6 intercostal artery on the right. The right T7 intercostal arterial branches demonstrate normal opacification of the muscular branches. The spinal branch also appears normal in caliber. A midline vessel structure is noted most likely representing a portion of the anterior spinal artery. The left T7 intercostal injection demonstrates the spinal branch and the lateral muscular branches to be widely patent. The delayed arterial phase  demonstrates a midline structure in the center projecting superiorly and inferiorly. This represents the anterior spinal artery. Again there is no early opacification or shunting or of a delayed venous structure noted. The right T8 intercostal injection demonstrates normal opacification of the spinal branch and the lateral muscular branches. The left T8 intercostal artery also demonstrates normal opacification of the muscular and also of the medially positioned spinal branch. The delayed arterial phase demonstrates a wisp of anterior spinal artery. The right T10 intercostal injection demonstrates normal opacification of the muscular and the anterior spinal branches. The left T10 intercostal injection demonstrates normal opacification of the muscular branches and of the spinal branches. Projecting medially and arising superiorly is a hairpin turn of vascular structure in the midline and then projecting caudally. This is most consistent with the artery of Adamkiewicz. No arteriovenous shunting of contrast is noted. There is no delayed presence of a vascular structure. Arising just caudal to this midline vascular structure is another vascular structure rising caudal to the one above. This also projects medially and then appears to make an upward turn. This probably represents the anterior spinal artery also. The dominant hairpin structure was followed caudally to the thoracolumbar region. However, its distal opacification is poorly defined in view of the overlying extensive gas and fecal material in the bowel. The right T11 intercostal injection demonstrates normal opacification of the muscular and the anterior spinal branches. No angiographic evidence of shunting of arteriovenous blood or of a delayed filling of a venous structure is noted in the midline or paramidline region. The left T11 intercostal artery demonstrates normal opacification of the muscular in the anterior spinal branches. There is also filling of the  T12 left intercostal. The injection of the T11 left intercostal again demonstrates the presence of yet another hairpin turn or loop of a vascular structure projecting cranially and then making a turn caudally in the midline. Again this is in the midline and is most consistent with an accessory or a duplicated artery of Adamkiewicz. The right T12 intercostal injection demonstrates normal opacification of the muscular in the anterior spinal branches. Again no evidence of arteriovenous shunting or of a delayed midline vessel structure is noted. The left T12 intercostal injection demonstrates normal opacification of the muscular and the anterior spinal branches. Inferiorly projecting anastomoses are seen with the left lumbar branches. Selective cannulation and arteriograms were performed of the right and left L1 lumbar arteries, the left L2 and transiently the right L2 arteries, the right and left L3 arteries, and the right and left L4 lumbar arteries. These demonstrate normal caliber and flow without evidence of arteriovenous shunting or delayed midline venous structures. Selective angiograms were also performed of the common iliacs which demonstrate no abnormally prominent vascular structures projecting cranially from either side. 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 A non dominant similarly configured vascular structure arising from the left intercostal artery at T11, probably representing an accessory duplicated  developmental and anatomical variation. Caudal extension of the artery of Adamkiewicz arising at T10 from the left intercostal artery followed to the thoracolumbar region. However, overlying gas and fecal material within the intestines precluding further visualization. Findings were discussed with the referring neurologist. Electronically Signed   By: Julieanne Cotton M.D.   On: 06/17/2016 20:22   Ir Angio Intra Extracran Sel Com Carotid Innominate  Bilat Mod Sed  Result Date: 06/18/2016 INDICATION: Progressive worsening bilateral lower extremity paraparesis, right greater than left associated with paresthesias and pain. Abnormal MRI of the the lower thoracic spine. EXAM: BILATERAL COMMON CAROTID AND INNOMINATE ANGIOGRAPHY AND BILATERAL VERTEBRAL ARTERY ANGIOGRAMS. THORACIC AND LUMBAR AORTOGRAMS. COMPLETE SPINAL ANGIOGRAM OF THE CERVICAL, THORACIC AND LUMBAR REGIONS. MEDICATIONS: None. The antibiotic was administered within 1 hour of the procedure ANESTHESIA/SEDATION: General anesthesia. FLUOROSCOPY TIME:  Fluoroscopy Time: 61 minutes 18 seconds (16109 mGy). COMPLICATIONS: None immediate. TECHNIQUE: Informed written consent was obtained from the patient after a thorough discussion of the procedural risks, benefits and alternatives. All questions were addressed. Maximal Sterile Barrier Technique was utilized including caps, mask, sterile gowns, sterile gloves, sterile drape, hand hygiene and skin antiseptic. A timeout was performed prior to the initiation of the procedure. PROCEDURE: The right groin was prepped and draped in the usual sterile fashion. Thereafter using modified Seldinger technique, transfemoral access into the right common femoral artery was obtained without difficulty. Over a 0.035 inch guidewire, a 5 French Pinnacle sheath was inserted. Through this, and also over 0.035 inch guidewire, a 5 French JB1 catheter was advanced to the aortic arch region and selectively positioned in the right common carotid artery, the right subclavian, the right vertebral artery, the left common carotid artery and the left subclavian artery. A 5 French pigtail catheter was then utilized to perform thoracic and lumbar using the injector device. A 5 [place] Mikaelsson diagnostic catheter was then utilized to cannulate and perform selective injections of the intercostal arteries and the lumbar arteries. FINDINGS: The right common carotid arteriogram demonstrates the  right external carotid artery and its major branches to be widely patent. The right internal carotid artery at the bulb to the cranial skull base opacifies normally. The petrous, the cavernous and the supraclinoid segments demonstrate normal opacification. A right posterior communicating artery is seen opacifying the right posterior cerebral artery and the superior cerebellar artery distributions. Also noted is slightly contralateral posterior cerebral artery. The right middle cerebral artery and the right anterior cerebral artery opacify normally into the capillary and venous phases. The right subclavian arteriogram demonstrates the thyrocervical and the costocervical trunks to be normal in its branches without abnormally prominent blood vessels projecting medially into the spinal canal area. The right vertebral artery origin demonstrates mild stenosis. The vessel is seen to opacify to the cranial skull base. Normal opacification is seen of the right vertebrobasilar junction and the right posterior-inferior cerebellar artery. The opacified portion the basilar artery, the posterior cerebral arteries, the superior cerebellar arteries and the anterior-inferior cerebellar arteries is normal into the delayed arterial phase. Non-opacified blood is seen in the basilar artery from the contralateral vertebral artery. The left vertebral artery origin is normal. The vessel is seen to opacify normally to the cranial skull base. Normal opacification is seen of the left vertebrobasilar junction and the left posterior inferior cerebellar artery distribution. The basilar artery, the posterior cerebral arteries, the superior cerebellar arteries and the anterior-inferior cerebellar arteries opacify normally into the capillary and venous phases. The left subclavian arteriogram demonstrates the costocervical and  thyrocervical trunk branches to be normally opacified. The left common carotid arteriogram demonstrates the left external  carotid artery and its major branches to be widely patent. The left internal carotid artery at the bulb to the cranial skull base opacifies normally. The petrous, cavernous and the supraclinoid segments are widely patent. The left middle cerebral artery and the left anterior cerebral artery opacify normally into the capillary and venous phases. A left posterior communicating artery is seen opacifying the left posterior cerebral artery distribution. Constant blush is noted in the region of the middle and the inferior terminus of the left maxillary sinus probably related to inflammation. Opacification of the left superior ophthalmic vein is noted. AORTOGRAMS OF THE THORACIC AND THE ABDOMINAL AORTA The thoracic aorta demonstrates normal opacification from the level of the distal portion of the aortic arch to the level of T12. The visualized intercostal branches from the superior intercostal artery to the level of T12 appear normal in caliber. No abnormal medially directed prominent vasculature is noted. No prominent venous structures are seen on the delayed arterial phase. The abdominal aortogram itself demonstrates normal opacification of the celiac trunk, the renal vasculature bilaterally, and the lower lumbar arteries. The bifurcation of the abdominal aorta into the common iliac arteries appears widely patent. Symmetrical lumbar arteries are noted bilaterally at the level from L3 to L5. Normal renal blush is noted with clearance of contrast. The visualized superior mesenteric artery and the inferior mesenteric arteries also opacify normally. SPINAL ANGIOGRAM Selective cannulations and angiographic findings were obtained as mentioned below. The right superior intercostal branch is noted arising at the level of T5. This is seen to extend superiorly and laterally to the level of T2 and T3 intercostal branches. Opacification of the bronchial vasculature is also noted from this artery. The selective T5 intercostal  injection demonstrates prompt opacification of the muscular and the anterior spinal branches projecting medially. Also noted is anastomosis with the right T4 intercostal branch. There is flash opacification of the left T5 intercostal artery also. Again no abnormal midline shunting or delayed opacification is noted. Also noted is opacification of the T6 intercostal artery on the right. The right T7 intercostal arterial branches demonstrate normal opacification of the muscular branches. The spinal branch also appears normal in caliber. A midline vessel structure is noted most likely representing a portion of the anterior spinal artery. The left T7 intercostal injection demonstrates the spinal branch and the lateral muscular branches to be widely patent. The delayed arterial phase demonstrates a midline structure in the center projecting superiorly and inferiorly. This represents the anterior spinal artery. Again there is no early opacification or shunting or of a delayed venous structure noted. The right T8 intercostal injection demonstrates normal opacification of the spinal branch and the lateral muscular branches. The left T8 intercostal artery also demonstrates normal opacification of the muscular and also of the medially positioned spinal branch. The delayed arterial phase demonstrates a wisp of anterior spinal artery. The right T10 intercostal injection demonstrates normal opacification of the muscular and the anterior spinal branches. The left T10 intercostal injection demonstrates normal opacification of the muscular branches and of the spinal branches. Projecting medially and arising superiorly is a hairpin turn of vascular structure in the midline and then projecting caudally. This is most consistent with the artery of Adamkiewicz. No arteriovenous shunting of contrast is noted. There is no delayed presence of a vascular structure. Arising just caudal to this midline vascular structure is another vascular  structure rising caudal to  the one above. This also projects medially and then appears to make an upward turn. This probably represents the anterior spinal artery also. The dominant hairpin structure was followed caudally to the thoracolumbar region. However, its distal opacification is poorly defined in view of the overlying extensive gas and fecal material in the bowel. The right T11 intercostal injection demonstrates normal opacification of the muscular and the anterior spinal branches. No angiographic evidence of shunting of arteriovenous blood or of a delayed filling of a venous structure is noted in the midline or paramidline region. The left T11 intercostal artery demonstrates normal opacification of the muscular in the anterior spinal branches. There is also filling of the T12 left intercostal. The injection of the T11 left intercostal again demonstrates the presence of yet another hairpin turn or loop of a vascular structure projecting cranially and then making a turn caudally in the midline. Again this is in the midline and is most consistent with an accessory or a duplicated artery of Adamkiewicz. The right T12 intercostal injection demonstrates normal opacification of the muscular in the anterior spinal branches. Again no evidence of arteriovenous shunting or of a delayed midline vessel structure is noted. The left T12 intercostal injection demonstrates normal opacification of the muscular and the anterior spinal branches. Inferiorly projecting anastomoses are seen with the left lumbar branches. Selective cannulation and arteriograms were performed of the right and left L1 lumbar arteries, the left L2 and transiently the right L2 arteries, the right and left L3 arteries, and the right and left L4 lumbar arteries. These demonstrate normal caliber and flow without evidence of arteriovenous shunting or delayed midline venous structures. Selective angiograms were also performed of the common iliacs which  demonstrate no abnormally prominent vascular structures projecting cranially from either side. 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 Caudal extension of the artery of Adamkiewicz arising at T10 from the left intercostal artery followed to the thoracolumbar region. However, overlying gas and fecal material within the intestines precluding further visualization. Findings were discussed with the referring neurologist. Electronically Signed   By: Julieanne Cotton M.D.   On: 06/17/2016 20:22   Ir Angio Vertebral Sel Subclavian Innominate Uni L Mod Sed  Result Date: 06/18/2016 INDICATION: Progressive worsening bilateral lower extremity paraparesis, right greater than left associated with paresthesias and pain. Abnormal MRI of the the lower thoracic spine. EXAM: BILATERAL COMMON CAROTID AND INNOMINATE ANGIOGRAPHY AND BILATERAL VERTEBRAL ARTERY ANGIOGRAMS. THORACIC AND LUMBAR AORTOGRAMS. COMPLETE SPINAL ANGIOGRAM OF THE CERVICAL, THORACIC AND LUMBAR REGIONS. MEDICATIONS: None. The antibiotic was administered within 1 hour of the procedure ANESTHESIA/SEDATION: General anesthesia. FLUOROSCOPY TIME:  Fluoroscopy Time: 61 minutes 18 seconds (16109 mGy). COMPLICATIONS: None immediate. TECHNIQUE: Informed written consent was obtained from the patient after a thorough discussion of the procedural risks, benefits and alternatives. All questions were addressed. Maximal Sterile Barrier Technique was utilized including caps, mask, sterile gowns, sterile gloves, sterile drape, hand hygiene and skin antiseptic. A timeout was performed prior to the initiation of the procedure. PROCEDURE: The right groin was prepped and draped in the usual sterile fashion. Thereafter using modified Seldinger  technique, transfemoral access into the right common femoral artery was obtained without difficulty. Over a 0.035 inch guidewire, a 5 French Pinnacle sheath was inserted. Through this, and also over 0.035 inch guidewire, a 5 French JB1 catheter  was advanced to the aortic arch region and selectively positioned in the right common carotid artery, the right subclavian, the right vertebral artery, the left common carotid artery and the left subclavian artery. A 5 French pigtail catheter was then utilized to perform thoracic and lumbar using the injector device. A 5 [place] Mikaelsson diagnostic catheter was then utilized to cannulate and perform selective injections of the intercostal arteries and the lumbar arteries. FINDINGS: The right common carotid arteriogram demonstrates the right external carotid artery and its major branches to be widely patent. The right internal carotid artery at the bulb to the cranial skull base opacifies normally. The petrous, the cavernous and the supraclinoid segments demonstrate normal opacification. A right posterior communicating artery is seen opacifying the right posterior cerebral artery and the superior cerebellar artery distributions. Also noted is slightly contralateral posterior cerebral artery. The right middle cerebral artery and the right anterior cerebral artery opacify normally into the capillary and venous phases. The right subclavian arteriogram demonstrates the thyrocervical and the costocervical trunks to be normal in its branches without abnormally prominent blood vessels projecting medially into the spinal canal area. The right vertebral artery origin demonstrates mild stenosis. The vessel is seen to opacify to the cranial skull base. Normal opacification is seen of the right vertebrobasilar junction and the right posterior-inferior cerebellar artery. The opacified portion the basilar artery, the posterior cerebral arteries, the superior cerebellar arteries and the  anterior-inferior cerebellar arteries is normal into the delayed arterial phase. Non-opacified blood is seen in the basilar artery from the contralateral vertebral artery. The left vertebral artery origin is normal. The vessel is seen to opacify normally to the cranial skull base. Normal opacification is seen of the left vertebrobasilar junction and the left posterior inferior cerebellar artery distribution. The basilar artery, the posterior cerebral arteries, the superior cerebellar arteries and the anterior-inferior cerebellar arteries opacify normally into the capillary and venous phases. The left subclavian arteriogram demonstrates the costocervical and thyrocervical trunk branches to be normally opacified. The left common carotid arteriogram demonstrates the left external carotid artery and its major branches to be widely patent. The left internal carotid artery at the bulb to the cranial skull base opacifies normally. The petrous, cavernous and the supraclinoid segments are widely patent. The left middle cerebral artery and the left anterior cerebral artery opacify normally into the capillary and venous phases. A left posterior communicating artery is seen opacifying the left posterior cerebral artery distribution. Constant blush is noted in the region of the middle and the inferior terminus of the left maxillary sinus probably related to inflammation. Opacification of the left superior ophthalmic vein is noted. AORTOGRAMS OF THE THORACIC AND THE ABDOMINAL AORTA The thoracic aorta demonstrates normal opacification from the level of the distal portion of the aortic arch to the level of T12. The visualized intercostal branches from the superior intercostal artery to the level of T12 appear normal in caliber. No abnormal medially directed prominent vasculature is noted. No prominent venous structures are seen on the delayed arterial phase. The abdominal aortogram itself demonstrates normal opacification of the  celiac trunk, the renal vasculature bilaterally, and the lower lumbar arteries. The bifurcation of the abdominal aorta into the common iliac arteries appears widely patent. Symmetrical lumbar arteries are noted bilaterally at the level from L3 to L5. Normal renal blush is noted with clearance of contrast. The visualized superior mesenteric artery and the inferior mesenteric arteries also opacify normally. SPINAL ANGIOGRAM Selective cannulations and angiographic findings were obtained as mentioned  below. The right superior intercostal branch is noted arising at the level of T5. This is seen to extend superiorly and laterally to the level of T2 and T3 intercostal branches. Opacification of the bronchial vasculature is also noted from this artery. The selective T5 intercostal injection demonstrates prompt opacification of the muscular and the anterior spinal branches projecting medially. Also noted is anastomosis with the right T4 intercostal branch. There is flash opacification of the left T5 intercostal artery also. Again no abnormal midline shunting or delayed opacification is noted. Also noted is opacification of the T6 intercostal artery on the right. The right T7 intercostal arterial branches demonstrate normal opacification of the muscular branches. The spinal branch also appears normal in caliber. A midline vessel structure is noted most likely representing a portion of the anterior spinal artery. The left T7 intercostal injection demonstrates the spinal branch and the lateral muscular branches to be widely patent. The delayed arterial phase demonstrates a midline structure in the center projecting superiorly and inferiorly. This represents the anterior spinal artery. Again there is no early opacification or shunting or of a delayed venous structure noted. The right T8 intercostal injection demonstrates normal opacification of the spinal branch and the lateral muscular branches. The left T8 intercostal artery  also demonstrates normal opacification of the muscular and also of the medially positioned spinal branch. The delayed arterial phase demonstrates a wisp of anterior spinal artery. The right T10 intercostal injection demonstrates normal opacification of the muscular and the anterior spinal branches. The left T10 intercostal injection demonstrates normal opacification of the muscular branches and of the spinal branches. Projecting medially and arising superiorly is a hairpin turn of vascular structure in the midline and then projecting caudally. This is most consistent with the artery of Adamkiewicz. No arteriovenous shunting of contrast is noted. There is no delayed presence of a vascular structure. Arising just caudal to this midline vascular structure is another vascular structure rising caudal to the one above. This also projects medially and then appears to make an upward turn. This probably represents the anterior spinal artery also. The dominant hairpin structure was followed caudally to the thoracolumbar region. However, its distal opacification is poorly defined in view of the overlying extensive gas and fecal material in the bowel. The right T11 intercostal injection demonstrates normal opacification of the muscular and the anterior spinal branches. No angiographic evidence of shunting of arteriovenous blood or of a delayed filling of a venous structure is noted in the midline or paramidline region. The left T11 intercostal artery demonstrates normal opacification of the muscular in the anterior spinal branches. There is also filling of the T12 left intercostal. The injection of the T11 left intercostal again demonstrates the presence of yet another hairpin turn or loop of a vascular structure projecting cranially and then making a turn caudally in the midline. Again this is in the midline and is most consistent with an accessory or a duplicated artery of Adamkiewicz. The right T12 intercostal injection  demonstrates normal opacification of the muscular in the anterior spinal branches. Again no evidence of arteriovenous shunting or of a delayed midline vessel structure is noted. The left T12 intercostal injection demonstrates normal opacification of the muscular and the anterior spinal branches. Inferiorly projecting anastomoses are seen with the left lumbar branches. Selective cannulation and arteriograms were performed of the right and left L1 lumbar arteries, the left L2 and transiently the right L2 arteries, the right and left L3 arteries, and the right and left L4  lumbar arteries. These demonstrate normal caliber and flow without evidence of arteriovenous shunting or delayed midline venous structures. Selective angiograms were also performed of the common iliacs which demonstrate no abnormally prominent vascular structures projecting cranially from either side. 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 Caudal extension of the artery of Adamkiewicz arising at T10 from the left intercostal artery followed to the thoracolumbar region. However, overlying gas and fecal material within the intestines precluding further visualization. Findings were discussed with the referring neurologist. Electronically Signed   By: Julieanne Cotton M.D.   On: 06/17/2016 20:22   Ir Angio Vertebral Sel Vertebral Uni R Mod Sed  Result Date: 06/18/2016 INDICATION: Progressive worsening bilateral lower extremity paraparesis, right greater than left associated with paresthesias and pain. Abnormal MRI of the the lower thoracic spine. EXAM: BILATERAL COMMON CAROTID AND INNOMINATE ANGIOGRAPHY AND BILATERAL VERTEBRAL ARTERY ANGIOGRAMS. THORACIC AND LUMBAR AORTOGRAMS. COMPLETE SPINAL ANGIOGRAM OF THE  CERVICAL, THORACIC AND LUMBAR REGIONS. MEDICATIONS: None. The antibiotic was administered within 1 hour of the procedure ANESTHESIA/SEDATION: General anesthesia. FLUOROSCOPY TIME:  Fluoroscopy Time: 61 minutes 18 seconds (16109 mGy). COMPLICATIONS: None immediate. TECHNIQUE: Informed written consent was obtained from the patient after a thorough discussion of the procedural risks, benefits and alternatives. All questions were addressed. Maximal Sterile Barrier Technique was utilized including caps, mask, sterile gowns, sterile gloves, sterile drape, hand hygiene and skin antiseptic. A timeout was performed prior to the initiation of the procedure. PROCEDURE: The right groin was prepped and draped in the usual sterile fashion. Thereafter using modified Seldinger technique, transfemoral access into the right common femoral artery was obtained without difficulty. Over a 0.035 inch guidewire, a 5 French Pinnacle sheath was inserted. Through this, and also over 0.035 inch guidewire, a 5 French JB1 catheter was advanced to the aortic arch region and selectively positioned in the right common carotid artery, the right subclavian, the right vertebral artery, the left common carotid artery and the left subclavian artery. A 5 French pigtail catheter was then utilized to perform thoracic and lumbar using the injector device. A 5 [place] Mikaelsson diagnostic catheter was then utilized to cannulate and perform selective injections of the intercostal arteries and the lumbar arteries. FINDINGS: The right common carotid arteriogram demonstrates the right external carotid artery and its major branches to be widely patent. The right internal carotid artery at the bulb to the cranial skull base opacifies normally. The petrous, the cavernous and the supraclinoid segments demonstrate normal opacification. A right posterior communicating artery is seen opacifying the right posterior cerebral artery and the superior cerebellar artery  distributions. Also noted is slightly contralateral posterior cerebral artery. The right middle cerebral artery and the right anterior cerebral artery opacify normally into the capillary and venous phases. The right subclavian arteriogram demonstrates the thyrocervical and the costocervical trunks to be normal in its branches without abnormally prominent blood vessels projecting medially into the spinal canal area. The right vertebral artery origin demonstrates mild stenosis. The vessel is seen to opacify to the cranial skull base. Normal opacification is seen of the right vertebrobasilar junction and the right posterior-inferior cerebellar artery. The opacified portion the basilar artery, the posterior cerebral arteries, the superior cerebellar arteries and the anterior-inferior cerebellar arteries is normal into the delayed arterial phase. Non-opacified blood is seen in the basilar artery from the contralateral  vertebral artery. The left vertebral artery origin is normal. The vessel is seen to opacify normally to the cranial skull base. Normal opacification is seen of the left vertebrobasilar junction and the left posterior inferior cerebellar artery distribution. The basilar artery, the posterior cerebral arteries, the superior cerebellar arteries and the anterior-inferior cerebellar arteries opacify normally into the capillary and venous phases. The left subclavian arteriogram demonstrates the costocervical and thyrocervical trunk branches to be normally opacified. The left common carotid arteriogram demonstrates the left external carotid artery and its major branches to be widely patent. The left internal carotid artery at the bulb to the cranial skull base opacifies normally. The petrous, cavernous and the supraclinoid segments are widely patent. The left middle cerebral artery and the left anterior cerebral artery opacify normally into the capillary and venous phases. A left posterior communicating artery is  seen opacifying the left posterior cerebral artery distribution. Constant blush is noted in the region of the middle and the inferior terminus of the left maxillary sinus probably related to inflammation. Opacification of the left superior ophthalmic vein is noted. AORTOGRAMS OF THE THORACIC AND THE ABDOMINAL AORTA The thoracic aorta demonstrates normal opacification from the level of the distal portion of the aortic arch to the level of T12. The visualized intercostal branches from the superior intercostal artery to the level of T12 appear normal in caliber. No abnormal medially directed prominent vasculature is noted. No prominent venous structures are seen on the delayed arterial phase. The abdominal aortogram itself demonstrates normal opacification of the celiac trunk, the renal vasculature bilaterally, and the lower lumbar arteries. The bifurcation of the abdominal aorta into the common iliac arteries appears widely patent. Symmetrical lumbar arteries are noted bilaterally at the level from L3 to L5. Normal renal blush is noted with clearance of contrast. The visualized superior mesenteric artery and the inferior mesenteric arteries also opacify normally. SPINAL ANGIOGRAM Selective cannulations and angiographic findings were obtained as mentioned below. The right superior intercostal branch is noted arising at the level of T5. This is seen to extend superiorly and laterally to the level of T2 and T3 intercostal branches. Opacification of the bronchial vasculature is also noted from this artery. The selective T5 intercostal injection demonstrates prompt opacification of the muscular and the anterior spinal branches projecting medially. Also noted is anastomosis with the right T4 intercostal branch. There is flash opacification of the left T5 intercostal artery also. Again no abnormal midline shunting or delayed opacification is noted. Also noted is opacification of the T6 intercostal artery on the right. The  right T7 intercostal arterial branches demonstrate normal opacification of the muscular branches. The spinal branch also appears normal in caliber. A midline vessel structure is noted most likely representing a portion of the anterior spinal artery. The left T7 intercostal injection demonstrates the spinal branch and the lateral muscular branches to be widely patent. The delayed arterial phase demonstrates a midline structure in the center projecting superiorly and inferiorly. This represents the anterior spinal artery. Again there is no early opacification or shunting or of a delayed venous structure noted. The right T8 intercostal injection demonstrates normal opacification of the spinal branch and the lateral muscular branches. The left T8 intercostal artery also demonstrates normal opacification of the muscular and also of the medially positioned spinal branch. The delayed arterial phase demonstrates a wisp of anterior spinal artery. The right T10 intercostal injection demonstrates normal opacification of the muscular and the anterior spinal branches. The left T10 intercostal injection demonstrates normal  opacification of the muscular branches and of the spinal branches. Projecting medially and arising superiorly is a hairpin turn of vascular structure in the midline and then projecting caudally. This is most consistent with the artery of Adamkiewicz. No arteriovenous shunting of contrast is noted. There is no delayed presence of a vascular structure. Arising just caudal to this midline vascular structure is another vascular structure rising caudal to the one above. This also projects medially and then appears to make an upward turn. This probably represents the anterior spinal artery also. The dominant hairpin structure was followed caudally to the thoracolumbar region. However, its distal opacification is poorly defined in view of the overlying extensive gas and fecal material in the bowel. The right T11  intercostal injection demonstrates normal opacification of the muscular and the anterior spinal branches. No angiographic evidence of shunting of arteriovenous blood or of a delayed filling of a venous structure is noted in the midline or paramidline region. The left T11 intercostal artery demonstrates normal opacification of the muscular in the anterior spinal branches. There is also filling of the T12 left intercostal. The injection of the T11 left intercostal again demonstrates the presence of yet another hairpin turn or loop of a vascular structure projecting cranially and then making a turn caudally in the midline. Again this is in the midline and is most consistent with an accessory or a duplicated artery of Adamkiewicz. The right T12 intercostal injection demonstrates normal opacification of the muscular in the anterior spinal branches. Again no evidence of arteriovenous shunting or of a delayed midline vessel structure is noted. The left T12 intercostal injection demonstrates normal opacification of the muscular and the anterior spinal branches. Inferiorly projecting anastomoses are seen with the left lumbar branches. Selective cannulation and arteriograms were performed of the right and left L1 lumbar arteries, the left L2 and transiently the right L2 arteries, the right and left L3 arteries, and the right and left L4 lumbar arteries. These demonstrate normal caliber and flow without evidence of arteriovenous shunting or delayed midline venous structures. Selective angiograms were also performed of the common iliacs which demonstrate no abnormally prominent vascular structures projecting cranially from either side. 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 Caudal  extension of the artery of Adamkiewicz arising at T10 from the left intercostal artery followed to the thoracolumbar region. However, overlying gas and fecal material within the intestines precluding further visualization. Findings were discussed with the referring neurologist. Electronically Signed   By: Julieanne Cotton M.D.   On: 06/17/2016 20:22    Recent Labs  06/16/16 0731  WBC 5.8  HGB 12.6*  HCT 37.0*  PLT 168    Recent Labs  06/16/16 0731  NA 135  K 4.3  CL 99*  GLUCOSE 115*  BUN 18  CREATININE 0.97  CALCIUM 9.3   CBG (last 3)  No results for input(s): GLUCAP in the last 72 hours.  Wt Readings from Last 3 Encounters:  06/15/16 98.4 kg (216 lb 14.9 oz)  05/26/16 102.1 kg (225 lb)    Physical Exam:  HENT:  Head: Normocephalic and atraumatic.  Eyes: Conjunctivae and EOM are normal. Left eye exhibits no discharge.  Neck: Normal range of motion. Neck supple. No thyromegaly present.  Cardiovascular: RRR Respiratory: CTA B GI: Soft. Bowel sounds are normal. He exhibits no  distension.  Genitourinary: no changes  Musculoskeletal: He exhibits no edema or tenderness.  Right thigh non-tender Neurological:  Neurological: He is alert and oriented to person, place, and time.  Pt alert and oriented.  B/l UE 5/5.  RLE: 0/5 HF to distal  LLE: 2+/5 HF, 3- KE and 3-/5 ADF/PF, 2 minus, hip abduction -stable exam Sensation intact to light touch in left lower limb, reduced around the right knee and right anterior thigh  DTRs 1+ LLE.  no resting tone Skin: Skin remains warm and dry.  Psych: pleasant and appropriate/engaging   Assessment/Plan: 1. Functional and mobility deficits secondary to thoracic myelopathy which require 3+ hours per day of interdisciplinary therapy in a comprehensive inpatient rehab setting. Physiatrist is providing close team supervision and 24 hour management of active medical problems listed below. Physiatrist and rehab team continue to assess  barriers to discharge/monitor patient progress toward functional and medical goals.  Function:  Bathing Bathing position   Position: Other (comment) (Sitting on BSC)  Bathing parts Body parts bathed by patient: Right arm, Left arm, Chest, Abdomen, Front perineal area Body parts bathed by helper: Buttocks, Right upper leg, Left upper leg, Right lower leg, Left lower leg, Back  Bathing assist Assist Level: Touching or steadying assistance(Pt > 75%)   Set up : To obtain items  Upper Body Dressing/Undressing Upper body dressing   What is the patient wearing?: Pull over shirt/dress     Pull over shirt/dress - Perfomed by patient: Thread/unthread right sleeve, Thread/unthread left sleeve, Put head through opening, Pull shirt over trunk          Upper body assist Assist Level: Set up   Set up : To obtain clothing/put away  Lower Body Dressing/Undressing Lower body dressing   What is the patient wearing?: Pants, Non-skid slipper socks Underwear - Performed by patient: Thread/unthread left underwear leg, Pull underwear up/down Underwear - Performed by helper: Thread/unthread right underwear leg, Thread/unthread left underwear leg, Pull underwear up/down Pants- Performed by patient: Thread/unthread right pants leg, Thread/unthread left pants leg Pants- Performed by helper: Thread/unthread right pants leg, Thread/unthread left pants leg, Pull pants up/down Non-skid slipper socks- Performed by patient: Don/doff right sock, Don/doff left sock Non-skid slipper socks- Performed by helper: Don/doff right sock, Don/doff left sock     Shoes - Performed by patient: Don/doff right shoe, Don/doff left shoe, Fasten right Shoes - Performed by helper: Fasten left (assist with placement of LLE and stabilize leg on opposite knee while pt tied shoe) AFO - Performed by patient: Don/doff right AFO     TED Hose - Performed by helper: Don/doff right TED hose, Don/doff left TED hose  Lower body assist  Assist for lower body dressing: Touching or steadying assistance (Pt > 75%)      Toileting Toileting Toileting activity did not occur: No continent bowel/bladder event Toileting steps completed by patient: Performs perineal hygiene Toileting steps completed by helper: Adjust clothing prior to toileting, Performs perineal hygiene, Adjust clothing after toileting Toileting Assistive Devices: Grab bar or rail  Toileting assist Assist level: Touching or steadying assistance (Pt.75%)   Transfers Chair/bed transfer Chair/bed transfer activity did not occur: N/A Chair/bed transfer method: Lateral scoot Chair/bed transfer assist level: Touching or steadying assistance (Pt > 75%) Chair/bed transfer assistive device: Sliding board, Armrests     Locomotion Ambulation     Max distance: 5 Assist level: 2 helpers   Wheelchair   Type: Manual Max wheelchair distance: >150 ft Assist Level: No help, No  cues, assistive device, takes more than reasonable amount of time  Cognition Comprehension Comprehension assist level: Follows complex conversation/direction with no assist  Expression Expression assist level: Expresses complex ideas: With no assist  Social Interaction Social Interaction assist level: Interacts appropriately with others with medication or extra time (anti-anxiety, antidepressant).  Problem Solving Problem solving assist level: Solves complex problems: Recognizes & self-corrects  Memory Memory assist level: Complete Independence: No helper   Medical Problem List and Plan: 1.  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   - angiogram c/w avm, couldn't be completed due to stool in colon    -follow up study on Monday. Pt had questions about timing of repeat angio which I answered to the best of my ability.  -continue CIR therapies, PT, OT as possible.   -continue therapies as tolerated                  2.  DVT  Prophylaxis/Anticoagulation: SCDs. Venous doppler study negative  - holding lovenox pending angio 3. Pain Management: Hydrocodone as needed 4. Hypertension. Norvasc 10 mg daily Monitor when out of bed . Appears controlled at present Vitals:   06/18/16 0046 06/18/16 0443  BP: 127/73 123/72  Pulse: 86 85  Resp: 18 20  Temp: 98.4 F (36.9 C) 98.3 F (36.8 C)   5. Neuropsych: This patient is capable of making decisions on his own behalf.  -provide ego support as needed   6. Skin/Wound Care: Routine skin checks  -groin sites clean/no hematoma 7. Fluids/Electrolytes/Nutrition:encourage PO  -recent labs stable  8.CRI stage III. Follow-up chemistries later this week potentially 9. Neurogenic bowel bladder.  -voiding attempts as possible    - reduced urecholine to 25mg  given loose stool.    -I/o cath prn    -having continent bowel movements---      LOS (Days) 17 A FACE TO FACE EVALUATION WAS PERFORMED  [name], MD 06/18/2016 8:56 AM

## 2016-06-18 NOTE — Plan of Care (Signed)
Problem: RH Balance Goal: LTG Patient will maintain dynamic standing with ADLs (OT) LTG:  Patient will maintain dynamic standing balance with assist during activities of daily living (OT)   Outcome: Not Applicable Date Met: 22/33/61 Goal d/c as no long appropriate due to pt's change in status. 2/8- AL  Problem: RH Bathing Goal: LTG Patient will bathe with assist, cues/equipment (OT) LTG: Patient will bathe specified number of body parts with assist with/without cues using equipment (position)  (OT)  Goal modified due to pt change in status- AL  Problem: RH Dressing Goal: LTG Patient will perform upper body dressing (OT) LTG Patient will perform upper body dressing with assist, with/without cues (OT).  Goal modified due to pt change in status- AL Goal: LTG Patient will perform lower body dressing w/assist (OT) LTG: Patient will perform lower body dressing with assist, with/without cues in positioning using equipment (OT)  Goal modified due to pt change in status- AL  Problem: RH Toileting Goal: LTG Patient will perform toileting w/assist, cues/equip (OT) LTG: Patient will perform toiletiing (clothes management/hygiene) with assist, with/without cues using equipment (OT)  Goal modified due to pt change in status- AL  Problem: RH Toilet Transfers Goal: LTG Patient will perform toilet transfers w/assist (OT) LTG: Patient will perform toilet transfers with assist, with/without cues using equipment (OT)  Goal modified due to pt change in status- AL

## 2016-06-18 NOTE — Plan of Care (Signed)
Problem: RH PAIN MANAGEMENT Goal: RH STG PAIN MANAGED AT OR BELOW PT'S PAIN GOAL Pain less than or equal to 2  Outcome: Progressing No pain at this time.

## 2016-06-18 NOTE — Patient Care Conference (Signed)
Inpatient RehabilitationTeam Conference and Plan of Care Update Date: 06/16/2016   Time: 2:05 PM    Patient Name: Perry Morrison      Medical Record Number: [id number]  Date of Birth: 07/10/1960 Sex: Male         Room/Bed: 4W02C/4W02C-01 Payor Info: Payor: MEDICAID POTENTIAL / Plan: MEDICAID POTENTIAL / Product Type: *No Product type* /    Admitting Diagnosis: Thoracie Myelopathy MYELITIS  Admit Date/Time:  06/01/2016  4:11 PM Admission Comments: No comment available   Primary Diagnosis:  Incomplete paraplegia (HCC) Principal Problem: Incomplete paraplegia [facility](HCC)  Patient Active Problem List   Diagnosis Date Noted  . 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 Weakness of right leg   . Neurogenic bowel   . Neurogenic bladder   . Incomplete paraplegia (HCC)   . GI bleed 05/30/2016  . Hypertension 05/26/2016  . Constipation 05/26/2016  . Abnormal MRI, thoracic spine 05/26/2016  . Myelitis (HCC)   . Right leg weakness   . Urinary retention   . AKI (acute kidney injury) (HCC)   . Aneurysm (HCC)   . CKD (chronic kidney disease), stage III     Expected Discharge Date: Expected Discharge Date: 06/27/16  Team Members Present: Physician leading conference: Dr. Faith RogueZachary Swartz Social Worker Present: Amada JupiterLucy Van Ehlert, LCSW Nurse Present: Chana Bodeeborah Sharp, RN PT Present: Katherine Mantleodney Wishart, Marcene BrawnPT;Elizabeth Tygielski, PT OT Present: Primitivo GauzeJulia Saguier, OT SLP Present: Feliberto Gottronourtney Payne, SLP     Current Status/Progress Goal Weekly Team Focus  Medical   ?epidural AVM. for angio today. has had further weakness develop last week. neuro following  find source of myelopathy  continued work up as above, bowel and bladder mgt   Bowel/Bladder   Cont. of Bowel. LBM 06/15/16. Abd has periods of distention. Inability to urinate. Straight cath. q4-6h.  Continue Bowel regimen. Continue med for urination.  Scheduled straight cath to train bladder.   Swallow/Nutrition/ Hydration             ADL's   Supervision sliding board  transfers; supervision lateral leans for clothing management/ toileting  Supervision- mod I, may downgrade to supervision overall due to pt progress  ADL re-training; activity tolerance; functional transfers   Mobility   min squat pivot transfers, S slideboard, standbyA sitting balance, mod/maxA sit <>stand. Decr focus on standing/gait d/t slow progress and RLE paresis  S bed mobility and transfers, minA gait and stair for home entry; will downgrade goals prior to d/c to w/c level goals only  transfers, bed mobility, sitting balance, LE ROM/NMR   Communication             Safety/Cognition/ Behavioral Observations  High Fall Risk. +2 Stedy.  Continue safety measures.  Update any safet measures on pink sheet.   Pain   No c/o pain.  Pain less than 3.  Assess pain level qshift.   Skin   No skin breakdown.  Prevent skin breakdown.  Assess skin qShift.    Rehab Goals Patient on target to meet rehab goals: Yes *See Care Plan and progress notes for long and short-term goals.  Barriers to Discharge: see prior    Possible Resolutions to Barriers:  see prior, treatment as noted above    Discharge Planning/Teaching Needs:  Pt to d/c home with grandmother who can provide supervision.  Note that pt is hesistant about going to her home and  placing a "burden" on her, however, this remains the plan.      Team Discussion:  Angiogram today.  W/c level goals for  supervision.  Currently @ min assist with PT/OT and team recommends extension of stay with change in d/c date to 2/17.  Need to complete a formal w/c eval.  Neuropsych to see pt for mood/ coping.  Revisions to Treatment Plan:  Change in d/c date.   Continued Need for Acute Rehabilitation Level of Care: The patient requires daily medical management by a physician with specialized training in physical medicine and rehabilitation for the following conditions: Daily direction of a multidisciplinary physical rehabilitation program to ensure safe  treatment while eliciting the highest outcome that is of practical value to the patient.: Yes Daily medical management of patient stability for increased activity during participation in an intensive rehabilitation regime.: Yes Daily analysis of laboratory values and/or radiology reports with any subsequent need for medication adjustment of medical intervention for : Neurological problems;Post surgical problems  Perry Morrison 06/18/2016, 9:05 AM

## 2016-06-18 NOTE — Progress Notes (Signed)
Occupational Therapy Weekly Progress Note  Patient Details  Name: Perry Morrison MRN: [id number] Date of Birth: 07-21-1960  Beginning of progress report period: June 09, 2016 End of progress report period: June 18, 2016  Today's Date: 06/18/2016 OT Individual Time: 0830-0930 and 1400-1500 OT Individual Time Calculation (min): 60 min and 60 min   Patient has met 1 of 4 short term goals.  Pt cont to decline in function since admission. He has gotten progressively weaker since admission to CIR. He displays decreased sitting balance and continuing of weakness in B LEs.He has also had changes in bowel program, now with some incontinent BMs. Pt also with decreased activity tolerance, requiring increased assist due to fatigue for tasks such as toileting and LB clothing management. He has been able to complete 3/3 toileting tasks from [facility], however, not consistently due to fatigue. Pt has been introduced to tub transfer bench as part of d/c planning, however, until home measurement sheet is returned, will not focus on tub/shower transfers at this time as it is unknown if bathroom is accessible. Standing goal d/c as pt now at total w/c level for OT/ ADL tasks.  Goals have been downgraded due to pt's change in status. CSW and PA made aware of pt's declining level and need for increased assist at d/c.   Patient not progressing toward long term goals.  See goal revision..  Plan of care revisions: Goals downgraded to overall min A. See POC for goal details.   OT Short Term Goals Week 2:  OT Short Term Goal 1 (Week 2): Pt will complete one grooming task in standing to increase functional standing balance/ tolerance OT Short Term Goal 1 - Progress (Week 2): Not met OT Short Term Goal 2 (Week 2): Pt will complete 3/3 toileting tasks with min A OT Short Term Goal 2 - Progress (Week 2): Partly met OT Short Term Goal 3 (Week 2): Pt will transfer to toilet with close supervision to decrease caregiver  burden OT Short Term Goal 3 - Progress (Week 2): Met OT Short Term Goal 4 (Week 2): Will initiate shower transfer goal utilizing tub/shower combination with tub transfer bench in prep for d/c OT Short Term Goal 4 - Progress (Week 2): Progressing toward goal Week 3:  OT Short Term Goal 1 (Week 3): STG=LTG due to LOS  Skilled Therapeutic Interventions/Progress Updates:    Session One: Pt seen for OT session focusing on bed mobility and toileting. Pt in supine upon arrival, voiced having had incontinent BM. Bed mobility completed throughout session with assist for management of LEs. Total A for hygiene and clothing management. Pt becoming more incontinent with each roll, stool unformed. Pt with small spot on buttock, made RN aware and she assessed- barrier cream applied. Pt left in supine at end of session, all needs in reach.  Session Two: Pt seen for OT session focusing on ADL re-training/ management of LEs in prep for LB dressing. Pt received in supported circle sitting position with hand off from PT. Cont to work in circle sit and long sitting position on mat. Educated regarding functional use of position for LB bathing/dressing. Pt able to don/dof L sock from modified circle sit position. He attempted figure four long sitting position, however, due to R hip tightness pt unable to maintain position without max- total A for sitting balance and B UE support. He transferred to sitting EOM using leg loops to manage LEs off edge, max A required to bring trunk upright. From EOM,  worked on pt leaning forward to reach towards feet. Pt very fearful with task despite anterior assist from therapist. With UE steadying assist on therapists lap, pt able to reach down and alternate UE touching foot, however, was not able to reach with B UEs simultaneously for functional use. He completed min A (for placement of board) sliding board transfer back to w/c. Pt desiring to sit on BSC at end of session as he was due for  suppository. He desired to use STEADY for transfer. Completed with mod-max A to stand into STEADY, total A clothing management. Pt left seated on BSC at end of session, aware of use of call bell when finished, all needs in reach.   Therapy Documentation Precautions:  Precautions Precautions: Fall Precaution Comments: RLE hemiparesis Other Brace/Splint: PRAFO R foot Restrictions Weight Bearing Restrictions: No Pain:   No/ denies pain  See Function Navigator for Current Functional Status.   Therapy/Group: Individual Therapy  [name], [name] 06/18/2016, 6:53 AM

## 2016-06-19 ENCOUNTER — Inpatient Hospital Stay (HOSPITAL_COMMUNITY): Payer: Self-pay | Admitting: Physical Therapy

## 2016-06-19 ENCOUNTER — Inpatient Hospital Stay (HOSPITAL_COMMUNITY): Payer: Self-pay

## 2016-06-19 ENCOUNTER — Inpatient Hospital Stay (HOSPITAL_COMMUNITY): Payer: Medicaid Other | Admitting: Occupational Therapy

## 2016-06-19 ENCOUNTER — Inpatient Hospital Stay (HOSPITAL_COMMUNITY): Payer: Self-pay | Admitting: Occupational Therapy

## 2016-06-19 NOTE — Progress Notes (Addendum)
Occupational Therapy Session Note  Patient Details  Name: Perry Morrison MRN: [id number] Date of Birth: Jan 01, 1961  Today's Date: 06/19/2016 OT Individual Time: 1122-1230 Total OT time: 68 minutes    Short Term Goals: Week 2:  OT Short Term Goal 1 (Week 2): Pt will complete one grooming task in standing to increase functional standing balance/ tolerance OT Short Term Goal 1 - Progress (Week 2): Not met OT Short Term Goal 2 (Week 2): Pt will complete 3/3 toileting tasks with min A OT Short Term Goal 2 - Progress (Week 2): Partly met OT Short Term Goal 3 (Week 2): Pt will transfer to toilet with close supervision to decrease caregiver burden OT Short Term Goal 3 - Progress (Week 2): Met OT Short Term Goal 4 (Week 2): Will initiate shower transfer goal utilizing tub/shower combination with tub transfer bench in prep for d/c OT Short Term Goal 4 - Progress (Week 2): Progressing toward goal  Skilled Therapeutic Interventions/Progress Updates: Patient sitting on Stedy upon arrival after having completed a BM with nursing.  He stated he was tired from no sleep last night, fever, and earlier therapies.    He stated he was having "chills" as well and asked to ly down and rest and complete stretching and exercises bed level.  Nursing checked BP and temperature.   Dynamap registered his temperature as 98.4.    Half way through the 60 minute session, his shoulders/chest and arms appeared to 'shiver.' and he stated he felt cold.  RN aware of his c/o of symptoms.    Patient requested stretching and exercise assist with his legs.  After the [facility] to bed transfer (close S) and bed mobility I(Moderate Assist), he concurred to complete AAROM and PROM with both lower extremities  He required set up assist for his strawberry chicken salad (The chicken was hard to cut).  He was left supine upright in bed eating his lunch with call bell and phone in place.     Therapy Documentation Precautions:   Precautions Precautions: Fall Precaution Comments: RLE hemiparesis Other Brace/Splint: PRAFO R foot Restrictions Weight Bearing Restrictions: No  Pain: denied   See Function Navigator for Current Functional Status.   Therapy/Group: Individual Therapy  Alfredia Ferguson [facility] 06/19/2016, 1:23 PM

## 2016-06-19 NOTE — Progress Notes (Signed)
PHYSICAL MEDICINE & REHABILITATION     PROGRESS NOTE    Subjective/Complaints:   Had some burning pain in his groin/pelvic region last night. Catheter bag was kinked---when unkinked it produced 1200cc urine  ROS: pt denies nausea, vomiting, diarrhea, cough, shortness of breath or chest pain        Objective: Vital Signs: Blood pressure 123/75, pulse 88, temperature (!) 100.6 F (38.1 C), temperature source Oral, resp. rate 19, height 6\' 1"  (1.854 m), weight 98.4 kg (216 lb 14.9 oz), SpO2 98 %. No results found. No results for input(s): WBC, HGB, HCT, PLT in the last 72 hours. No results for input(s): NA, K, CL, GLUCOSE, BUN, CREATININE, CALCIUM in the last 72 hours.  Invalid input(s): CO CBG (last 3)  No results for input(s): GLUCAP in the last 72 hours.  Wt Readings from Last 3 Encounters:  06/15/16 98.4 kg (216 lb 14.9 oz)  05/26/16 102.1 kg (225 lb)    Physical Exam:  HENT:  Head: Normocephalic and atraumatic.  Eyes: Conjunctivae and EOM are normal. Left eye exhibits no discharge.  Neck: Normal range of motion. Neck supple. No thyromegaly present.  Cardiovascular: RRR Respiratory: CTA B GI: Soft. Bowel sounds are normal. He exhibits no distension.  Genitourinary: no changes  Musculoskeletal: He exhibits no edema or tenderness.  Right thigh non-tender Neurological:  Neurological: He is alert and oriented to person, place, and time.  Pt alert and oriented.  B/l UE 5/5.  RLE: 0/5 HF to distal  LLE: 2+/5 HF, 3- KE and 3-/5 ADF/PF, 2 minus, hip abduction -stable exam Sensation intact to light touch in left lower limb, reduced around the right knee and right anterior thigh  DTRs 1+ LLE.  no resting tone Skin: Skin remains warm and dry.  Psych: pleasant and appropriate/engaging   Assessment/Plan: 1. Functional and mobility deficits secondary to thoracic myelopathy which require 3+ hours per day of interdisciplinary therapy in a comprehensive inpatient  rehab setting. Physiatrist is providing close team supervision and 24 hour management of active medical problems listed below. Physiatrist and rehab team continue to assess barriers to discharge/monitor patient progress toward functional and medical goals.  Function:  Bathing Bathing position   Position: Other (comment) (Sitting on BSC)  Bathing parts Body parts bathed by patient: Right arm, Left arm, Chest, Abdomen, Front perineal area Body parts bathed by helper: Buttocks, Right upper leg, Left upper leg, Right lower leg, Left lower leg, Back  Bathing assist Assist Level: Touching or steadying assistance(Pt > 75%)   Set up : To obtain items  Upper Body Dressing/Undressing Upper body dressing   What is the patient wearing?: Pull over shirt/dress     Pull over shirt/dress - Perfomed by patient: Thread/unthread right sleeve, Thread/unthread left sleeve, Put head through opening, Pull shirt over trunk          Upper body assist Assist Level: Set up   Set up : To obtain clothing/put away  Lower Body Dressing/Undressing Lower body dressing   What is the patient wearing?: Pants, Non-skid slipper socks Underwear - Performed by patient: Thread/unthread left underwear leg, Pull underwear up/down Underwear - Performed by helper: Thread/unthread right underwear leg, Thread/unthread left underwear leg, Pull underwear up/down Pants- Performed by patient: Thread/unthread right pants leg, Thread/unthread left pants leg Pants- Performed by helper: Thread/unthread right pants leg, Thread/unthread left pants leg, Pull pants up/down Non-skid slipper socks- Performed by patient: Don/doff right sock, Don/doff left sock Non-skid slipper socks- Performed by helper: Don/doff  right sock, Don/doff left sock     Shoes - Performed by patient: Don/doff right shoe, Don/doff left shoe, Fasten right Shoes - Performed by helper: Fasten left (assist with placement of LLE and stabilize leg on opposite knee  while pt tied shoe) AFO - Performed by patient: Don/doff right AFO     TED Hose - Performed by helper: Don/doff right TED hose, Don/doff left TED hose  Lower body assist Assist for lower body dressing: Touching or steadying assistance (Pt > 75%)      Toileting Toileting Toileting activity did not occur: No continent bowel/bladder event Toileting steps completed by patient: Performs perineal hygiene Toileting steps completed by helper: Adjust clothing prior to toileting, Performs perineal hygiene, Adjust clothing after toileting Toileting Assistive Devices: Grab bar or rail  Toileting assist Assist level: Touching or steadying assistance (Pt.75%)   Transfers Chair/bed transfer Chair/bed transfer activity did not occur: N/A Chair/bed transfer method: Lateral scoot Chair/bed transfer assist level: Touching or steadying assistance (Pt > 75%) Chair/bed transfer assistive device: Sliding board, Armrests     Locomotion Ambulation     Max distance: 5 Assist level: 2 helpers   Wheelchair   Type: Manual Max wheelchair distance: >150 ft Assist Level: No help, No cues, assistive device, takes more than reasonable amount of time  Cognition Comprehension Comprehension assist level: Follows complex conversation/direction with no assist  Expression Expression assist level: Expresses complex ideas: With no assist  Social Interaction Social Interaction assist level: Interacts appropriately with others with medication or extra time (anti-anxiety, antidepressant).  Problem Solving Problem solving assist level: Solves complex problems: Recognizes & self-corrects  Memory Memory assist level: Complete Independence: No helper   Medical Problem List and Plan: 1.  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   - angiogram c/w avm, couldn't be completed due to stool in colon    -follow up study on Monday. Pt had questions about timing of repeat angio which  I answered to the best of my ability. Have requested radiology to follow up with the patient  -continue CIR therapies, PT, OT as possible.   -continue therapies as tolerated                  2.  DVT Prophylaxis/Anticoagulation: SCDs. Venous doppler study negative  - holding lovenox pending angio 3. Pain Management: Hydrocodone as needed 4. Hypertension. Norvasc 10 mg daily Monitor when out of bed . controlled Vitals:   06/18/16 1300 06/19/16 0519  BP: (!) 143/83 123/75  Pulse: (!) 101 88  Resp: 18 19  Temp: 98 F (36.7 C) (!) 100.6 F (38.1 C)   5. Neuropsych: This patient is capable of making decisions on his own behalf.  -provide ego support as needed   6. Skin/Wound Care: Routine skin checks  -groin sites clean/no hematoma 7. Fluids/Electrolytes/Nutrition:encourage PO  -recent labs stable  8.CRI stage III. Follow-up chemistries later this week potentially 9. Neurogenic bowel bladder.  -no spontaneous voiding other than some incontinence   -will dc urecholine at this point.    -I/o cath prn    -having continent/incontinent bowel movements---      LOS (Days) 18 A FACE TO FACE EVALUATION WAS PERFORMED  Faith Rogue T, MD 06/19/2016 9:16 AM

## 2016-06-19 NOTE — Progress Notes (Signed)
Called to room by OT, pt continues to have cold chills and shaking after warm blankets applied and covered with extra sheets. Returned to bed after therapy session, had just had a bowel movement on the toilet and foley draining cloudy amber fluid. VSS, Dan [place] PAC nlotieified of situation. Pt notes this is a new symptom, last week jsut had shaking with muscle spasms, this is just cold chills, feels like he is outside without adequate coverage of clothes. Also noted that he has a "band" sensation of a tightening around his midline that was there when he came into the hospital and it has not subsided. Asking what might be causing all of this? Bladder scanned and foley manipulated to assure not kinked. Bladder scan zero. Repositioned in bed with extra blankets. Administered xanax and scheduled baclofen as recommended. Await further instructions/recommendations for treatment.Pamelia HoitSharp, Safira Proffit B

## 2016-06-19 NOTE — Plan of Care (Signed)
Problem: RH BLADDER ELIMINATION Goal: RH STG MANAGE BLADDER WITH ASSISTANCE STG Manage Bladder With min  Assistance   Outcome: Not Progressing Foley care

## 2016-06-19 NOTE — Progress Notes (Signed)
Occupational Therapy Session Note  Patient Details  Name: Perry Morrison MRN: [id number] Date of Birth: 01/20/1961  Today's Date: 06/19/2016 OT Individual Time: 9562-13081300-1312 OT Individual Time Calculation (min): 12 min  and Today's Date: 06/19/2016 OT Missed Time: 48 Minutes Missed Time Reason: Patient ill (comment)   Short Term Goals: Week 3:  OT Short Term Goal 1 (Week 3): STG=LTG due to LOS  Skilled Therapeutic Interventions/Progress Updates:    Pt in supine upon arrival, room very warm, pt with multiple blankets on and teeth chattering and UB shaking. RN made aware of pt's condition though she was already aware. Vitals assesed. BP 150/81 HR 112 Temp 98.3. Checked catheter and repositioned for better flow, RN emptied and flushed catheter, plans to scan bladder. Pt declined therapy due to feeling ill. Pt's PA made aware. Left in supine, all needs in reach, RN present.   Therapy Documentation Precautions:  Precautions Precautions: Fall Precaution Comments: RLE hemiparesis Other Brace/Splint: PRAFO R foot Restrictions Weight Bearing Restrictions: No  See Function Navigator for Current Functional Status.   Therapy/Group: Individual Therapy  [name], Melek Pownall C 06/19/2016, 7:18 AM

## 2016-06-19 NOTE — Evaluation (Signed)
Recreational Therapy Assessment and Plan  Patient Details  Name: Perry Morrison MRN: [id number] Date of Birth: [birth year] Today's Date: 06/19/2016  Rehab Potential: Good ELOS: 10 days   Assessment Clinical Impression:Problem List:      Patient Active Problem List   Diagnosis Date Noted  . 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 Weakness of right leg   . Neurogenic bowel   . Neurogenic bladder   . Incomplete paraplegia ([place])   . GI bleed 05/30/2016  . Hypertension 05/26/2016  . Constipation 05/26/2016  . Abnormal MRI, thoracic spine 05/26/2016  . Myelitis ([place])   . Right leg weakness   . Urinary retention   . AKI (acute kidney injury) ([place])   . Aneurysm ([place])   . CKD (chronic kidney disease), stage III     Past Medical History:      Past Medical History:  Diagnosis Date  . AKI (acute kidney injury) ([place])   . 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 Hypertension   . Myelitis ([place])   . Right leg weakness   . Urinary retention    Past Surgical History:       Past Surgical History:  Procedure Laterality Date  . FRACTURE SURGERY Right 1986    Assessment & Plan Clinical Impression: Patient is a 56 y.o. year old male with recent admission to the hospital with history of hypertension on no prescription antihypertensives, CRI stage III creatinine 1.32. Per chart review patient and patient, patient currently living with afemaleroommatethat works during the day. He is going through a divorce and recently lost his job. Independent prior to admission.Most of his family is in the [place] area.Presented 05/26/2016 with abdominal pain constipation times several days. He was initially seen in the emergency room given some muscle relaxers but then developed right lower extremity weakness. MRI of the thoracic spine, reviewed, demonstrating swelling in the midportion of the cord at T5 level with some right sided lesion consistent with  hemorrhage within the cord down to the level of T9. There was some mass effect in the cord itself from the lesion. No spinal AVM or fistula identified. The rest of the cervical spine film was unremarkable. Cranial CT scan negative. MRI of the brain showed no significant white matter signal abnormalities to suggest demyelinating disease. Lumbar puncture completed total protein 81, glucose 56, CSF showed no white cells. 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 Placed on intravenous Solu-Medrol 5 doses and completed . Positive occult stool 05/30/2016 felt to be related to possible hemorrhoids and straining for bowel program with hemoglobin and hematocrit stable. Physical and occupational therapy evaluations completed 05/27/2016 with recommendations of physical medicine rehabilitation consult.  Patient transferred to CIR on 06/01/2016.   Pt presents with decreased activity tolerance, decreased functional mobility, decreased balance, increased pain, feeling of anxiousness Limiting pt's independence with leisure/community pursuits.    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 (gym-work out, [employer], tennis, has 55 yo son who plays on sports teams) Expression Interests: Music (Comment) Other Leisure Interests: Television;Movies Leisure Participation Style: With Family/Friends Awareness of Community Resources: Excellent Psychosocial / Spiritual Patient agreeable to Pet Therapy: Yes Does patient have pets?: Yes (yorkie) Social interaction - Mood/Behavior: Cooperative Academic librarian Appropriate for Education?: Yes Patient Agreeable to Outing?: Yes Recreational Therapy Orientation Orientation -Reviewed with patient: Available activity  resources Strengths/Weaknesses Patient  Strengths/Abilities: Willingness to participate;Active premorbidly Patient weaknesses: Physical limitations TR Patient demonstrates impairments in the following area(s): Edema;Endurance;Motor;Pain;Safety  Plan Rec Therapy Plan Is patient appropriate for Therapeutic Recreation?: Yes Rehab Potential: Good Treatment times per week: Min 1 time per week >20 minutes Estimated Length of Stay: 10 days TR Treatment/Interventions: Adaptive equipment instruction;1:1 session;Balance/vestibular training;Functional mobility training;Community reintegration;Group participation (Comment);Patient/family education;Therapeutic activities;Recreation/leisure participation;Therapeutic exercise;UE/LE Coordination activities;Wheelchair propulsion/positioning Recommendations for other services: Neuropsych  Recommendations for other services: Neuropsych  Discharge Criteria: Patient will be discharged from TR if patient refuses treatment 3 consecutive times without medical reason.  If treatment goals not met, if there is a change in medical status, if patient makes no progress towards goals or if patient is discharged from hospital.  The above assessment, treatment plan, treatment alternatives and goals were discussed and mutually agreed upon: by patient  [place] 06/19/2016, 10:31 AM

## 2016-06-19 NOTE — Plan of Care (Signed)
Problem: RH Balance Goal: LTG Patient will maintain dynamic standing balance (PT) LTG:  Patient will maintain dynamic standing balance with assistance during mobility activities (PT)  Outcome: Not Applicable Date Met: 35/78/97 Goal d/c due to pt change in status  Problem: RH Ambulation Goal: LTG Patient will ambulate in controlled environment (PT) LTG: Patient will ambulate in a controlled environment, # of feet with assistance (PT).  Outcome: Not Applicable Date Met: 84/78/41 Goal d/c due to change in status Goal: LTG Patient will ambulate in home environment (PT) LTG: Patient will ambulate in home environment, # of feet with assistance (PT).  Outcome: Not Applicable Date Met: 28/20/81 Goal d/c due to change in status  Problem: RH Stairs Goal: LTG Patient will ambulate up and down stairs w/assist (PT) LTG: Patient will ambulate up and down # of stairs with assistance (PT)  Outcome: Not Applicable Date Met: 38/87/19 Goal d/c due to change in status

## 2016-06-19 NOTE — Progress Notes (Signed)
Physical Therapy Note  Patient Details  Name: Perry GraffJamie Kazmi MRN: [id number] Date of Birth: 12/19/1960 Today's Date: 06/19/2016    Pt missed 60 min of skilled PT due to not feeling well. Pt with reports of "chills" and visually shaking. Pt under several blankets and room is very warm. RN aware and OT took vitals prior to this session. Will follow up as able.    Philip AspenAlison B. Kaelem Brach, PT, DPT  06/19/2016, 2:17 PM

## 2016-06-19 NOTE — Progress Notes (Signed)
Physical Therapy Weekly Progress Note  Patient Details  Name: Perry Morrison MRN: [id number] Date of Birth: 1960-05-29  Beginning of progress report period: May 12, 2016 End of progress report period: June 19, 2016  Today's Date: 06/19/2016 PT Individual Time: 0900-1000 PT Individual Time Calculation (min): 60 min   Patient has met 3 of 3 short term goals.  Pt currently requires S>minA for bed mobility, transfers, sitting balance. Performing w/c mobility modI short distances before UEs fatigue. Pt with decline in functional mobility since eval; goals have been downgraded to S at w/c level overall with gait goals d/c at this time. RLE has not demonstrated any increase in activation or functional use in standing, and postural control has declined as well resulting in standing balance unsafe for any functional activities. Pt remains motivated to improve independence with functional mobility, however he is struggling with depressed mood because of the course of his hospitalization and is being followed by neuropsych for this. [name] who pt plans to d/c home with has not been present for any PT sessions at this time; would recommend grandmother attend family education session to discuss recommendations and improve understanding of pt's limitations.   Patient continues to demonstrate the following deficits muscle weakness and muscle paralysis, decreased cardiorespiratoy endurance, abnormal tone, unbalanced muscle activation and decreased coordination and decreased sitting balance, decreased standing balance, decreased postural control, hemiplegia and decreased balance strategies and therefore will continue to benefit from skilled PT intervention to increase functional independence with mobility.  Patient not progressing toward long term goals.  See goal revision..  Plan of care revisions: S overall at w/c level, gait goals d/c due to decline in functional status, RLE paresis.  PT Short Term  Goals Week 2:  PT Short Term Goal 1 (Week 2): Pt will be able to manage w/c parts to set up for transfers with supervision cues PT Short Term Goal 1 - Progress (Week 2): Met PT Short Term Goal 2 (Week 2): Pt will complete squat pivot transfers with supervision.  PT Short Term Goal 2 - Progress (Week 2): Partly met (performing transfer board lateral scoot with S) PT Short Term Goal 3 (Week 2): Pt will perform bed mobility with supervision. PT Short Term Goal 3 - Progress (Week 2): Met Week 3:  PT Short Term Goal 1 (Week 3): =LTG downgraded to S w/c level   Skilled Therapeutic Interventions/Progress Updates: Pt received seated in w/c, donning leg loops with difficulty and ultimately donned totalA for energy/time conservation. Session co-treat with recreational therapist. W/c propulsion to gym with modI. Squat pivot transfer w/c <>mat table with transfer board and close S. Sitting balance in short sit, short sit feet unsupported, and long sit while shooting basketball at hoop for dynamic sitting balance; min guard overall with pt recovering LOBs with UE support. Figure 4 position x3 min with LLE in hip ER for PROM to improve use and positioning of LEs in functional activities such as dressing. Unable to balance without UE support in figure 4 position. Catheter bag replaced d/t leak noted in bag. Returned to room totalA for energy conservation. Transfer w/c >bed with S. Sit >supine modA for energy conservation. Remained supine in bed at end of session, all needs in reach, LEs floated.     Therapy Documentation Precautions:  Precautions Precautions: Fall Precaution Comments: RLE hemiparesis Other Brace/Splint: PRAFO R foot Restrictions Weight Bearing Restrictions: No Pain:  Denies pain   See Function Navigator for Current Functional Status.  Therapy/Group: Individual  Therapy  [name] 06/19/2016, 10:26 AM

## 2016-06-20 ENCOUNTER — Inpatient Hospital Stay (HOSPITAL_COMMUNITY): Payer: Medicaid Other | Admitting: Occupational Therapy

## 2016-06-20 DIAGNOSIS — I10 Essential (primary) hypertension: Secondary | ICD-10-CM

## 2016-06-20 DIAGNOSIS — G0491 Myelitis, unspecified: Secondary | ICD-10-CM

## 2016-06-20 NOTE — Progress Notes (Signed)
Occupational Therapy Session Note  Patient Details  Name: Perry Morrison MRN: [id number] Date of Birth: 01/25/1961  Today's Date: 06/20/2016 OT Individual Time: 1330-1410 OT Individual Time Calculation (min): 40 min    Short Term Goals: Week 3:  OT Short Term Goal 1 (Week 3): STG=LTG due to LOS  Skilled Therapeutic Interventions/Progress Updates:    Pt seen for OT session focusing on functional transfers and ADL re-training. Pt in supine upon arrival, agreeable to tx session. Transferred to EOB using hospital bed functions and leg lifter to manage R LE able to manage L LE independently with increased time and effort. Upon sitting up voiced need for toileting task. Close supervision sliding board [facility] with assist to steady equipment. Pt with +BM, able to complete hygiene with set-up and completing push up from [facility] for therapist to assist with clothing management. Pt with bleeding when wiping due to hemorrhoids, RN already aware. Completed sliding board transfer back to w/c upon completion. Left sitting up in w/c to complete grooming tasks at sink, NT present.   Therapy Documentation Precautions:  Precautions Precautions: Fall Precaution Comments: RLE hemiparesis Other Brace/Splint: PRAFO R foot Restrictions Weight Bearing Restrictions: No Pain:   No/ denies pain  See Function Navigator for Current Functional Status.   Therapy/Group: Individual Therapy  [name], Lamyah Creed C 06/20/2016, 6:44 AM

## 2016-06-20 NOTE — Progress Notes (Signed)
[facility] PHYSICAL MEDICINE & REHABILITATION     PROGRESS NOTE    Subjective/Complaints:  Pt seen laying in bed this AM.  He slept well overnight.    ROS: Denies nausea, vomiting, diarrhea, shortness of breath or chest pain  Objective: Vital Signs: Blood pressure 122/78, pulse 82, temperature 99.3 F (37.4 C), temperature source Oral, resp. rate 18, height 6\' 1"  (1.854 m), weight 98.4 kg (216 lb 14.9 oz), SpO2 98 %. No results found. No results for input(s): WBC, HGB, HCT, PLT in the last 72 hours. No results for input(s): NA, K, CL, GLUCOSE, BUN, CREATININE, CALCIUM in the last 72 hours.  Invalid input(s): CO CBG (last 3)  No results for input(s): GLUCAP in the last 72 hours.  Wt Readings from Last 3 Encounters:  06/15/16 98.4 kg (216 lb 14.9 oz)  05/26/16 102.1 kg (225 lb)    Physical Exam:  Gen: NAD. Vital signs reviewed. HENT: Normocephalic and atraumatic.  Eyes: EOM are normal. No discharge.  Cardiovascular: RRR. No JVD. Respiratory: CTA B. Unlabored. GI: Soft. Bowel sounds are normal.  Musculoskeletal: He exhibits no edema or tenderness.   Neurological:  He is alert and oriented.  B/l UE 5/5.  RLE: 0/5 HF to distal  LLE: 2/5 HF, 3-/5 KE and 4-/5 ADF/PF Skin: Skin remains warm and dry. Intact.  Psych: pleasant and appropriate  Assessment/Plan: 1. Functional and mobility deficits secondary to thoracic myelopathy which require 3+ hours per day of interdisciplinary therapy in a comprehensive inpatient rehab setting. Physiatrist is providing close team supervision and 24 hour management of active medical problems listed below. Physiatrist and rehab team continue to assess barriers to discharge/monitor patient progress toward functional and medical goals.  Function:  Bathing Bathing position   Position: Other (comment) (Sitting on BSC)  Bathing parts Body parts bathed by patient: Right arm, Left arm, Chest, Abdomen, Front perineal area Body parts bathed by  helper: Buttocks, Right upper leg, Left upper leg, Right lower leg, Left lower leg, Back  Bathing assist Assist Level: Touching or steadying assistance(Pt > 75%)   Set up : To obtain items  Upper Body Dressing/Undressing Upper body dressing   What is the patient wearing?: Pull over shirt/dress     Pull over shirt/dress - Perfomed by patient: Thread/unthread right sleeve, Thread/unthread left sleeve, Put head through opening, Pull shirt over trunk          Upper body assist Assist Level: Set up   Set up : To obtain clothing/put away  Lower Body Dressing/Undressing Lower body dressing   What is the patient wearing?: Pants, Non-skid slipper socks Underwear - Performed by patient: Thread/unthread left underwear leg, Pull underwear up/down Underwear - Performed by helper: Thread/unthread right underwear leg, Thread/unthread left underwear leg, Pull underwear up/down Pants- Performed by patient: Thread/unthread right pants leg, Thread/unthread left pants leg Pants- Performed by helper: Thread/unthread right pants leg, Thread/unthread left pants leg, Pull pants up/down Non-skid slipper socks- Performed by patient: Don/doff right sock, Don/doff left sock Non-skid slipper socks- Performed by helper: Don/doff right sock, Don/doff left sock     Shoes - Performed by patient: Don/doff right shoe, Don/doff left shoe, Fasten right Shoes - Performed by helper: Fasten left (assist with placement of LLE and stabilize leg on opposite knee while pt tied shoe) AFO - Performed by patient: Don/doff right AFO     TED Hose - Performed by helper: Don/doff right TED hose, Don/doff left TED hose  Lower body assist Assist for lower body  dressing: Touching or steadying assistance (Pt > 75%)      Toileting Toileting Toileting activity did not occur: No continent bowel/bladder event Toileting steps completed by patient: Adjust clothing prior to toileting, Performs perineal hygiene, Adjust clothing after  toileting Toileting steps completed by helper: Adjust clothing prior to toileting, Adjust clothing after toileting Toileting Assistive Devices: Grab bar or rail  Toileting assist Assist level: Touching or steadying assistance (Pt.75%)   Transfers Chair/bed transfer Chair/bed transfer activity did not occur: N/A Chair/bed transfer method: Lateral scoot Chair/bed transfer assist level: Supervision or verbal cues Chair/bed transfer assistive device: Armrests, Sliding board     Locomotion Ambulation     Max distance: 5 Assist level: 2 helpers   Wheelchair   Type: Manual Max wheelchair distance: >150 ft Assist Level: No help, No cues, assistive device, takes more than reasonable amount of time  Cognition Comprehension Comprehension assist level: Follows complex conversation/direction with no assist  Expression Expression assist level: Expresses complex ideas: With no assist  Social Interaction Social Interaction assist level: Interacts appropriately with others with medication or extra time (anti-anxiety, antidepressant).  Problem Solving Problem solving assist level: Solves complex problems: With extra time  Memory Memory assist level: Complete Independence: No helper   Medical Problem List and Plan: 1.  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   MRI 2/2 reviewed showing increased in signal intensity throughout thoracic spine  - angiogram c/w avm, couldn't be completed due to stool in colon   -follow up study on Monday.   -continue CIR therapies, PT, OT as possible. 2.  DVT Prophylaxis/Anticoagulation: SCDs. Venous doppler study negative  - holding lovenox pending angio 3. Pain Management: Hydrocodone as needed 4. Hypertension. Norvasc 10 mg daily Monitor when out of bed . Controlled 2/10 5. Neuropsych: This patient is capable of making decisions on his own behalf.  -provide ego support as needed 6. Skin/Wound Care: Routine skin  checks 7. Fluids/Electrolytes/Nutrition:encourage PO  -recent labs stable 2/6 8.CRI stage III. Follow-up chemistries next week potentially 9. Neurogenic bowel bladder.  -no spontaneous voiding other than some incontinence   -dced urecholine   -I/o cath prn  -having continent/incontinent bowel movements---    LOS (Days) 19 A FACE TO FACE EVALUATION WAS PERFORMED  Jaslyn Bansal Karis JubaAnil Tesa Meadors, MD 06/20/2016 8:37 AM

## 2016-06-21 ENCOUNTER — Inpatient Hospital Stay (HOSPITAL_COMMUNITY): Payer: Medicaid Other

## 2016-06-21 ENCOUNTER — Inpatient Hospital Stay (HOSPITAL_COMMUNITY): Payer: Medicaid Other | Admitting: Physical Therapy

## 2016-06-21 DIAGNOSIS — R140 Abdominal distension (gaseous): Secondary | ICD-10-CM

## 2016-06-21 MED ORDER — MAGNESIUM CITRATE PO SOLN
1.0000 | Freq: Once | ORAL | Status: AC
  Administered 2016-06-21: 1 via ORAL
  Filled 2016-06-21: qty 296

## 2016-06-21 MED ORDER — SIMETHICONE 80 MG PO CHEW
80.0000 mg | CHEWABLE_TABLET | Freq: Four times a day (QID) | ORAL | Status: DC | PRN
  Administered 2016-06-21 – 2016-07-06 (×8): 80 mg via ORAL
  Filled 2016-06-21 (×8): qty 1

## 2016-06-21 NOTE — Progress Notes (Signed)
[facility] PHYSICAL MEDICINE & REHABILITATION     PROGRESS NOTE    Subjective/Complaints:  Pt laying in bed this.  He had some difficulty sleeping overnight due to gas pain.  He hopes that he will have successful study tomorrow.   ROS: +Gas. Denies nausea, vomiting, diarrhea, shortness of breath or chest pain  Objective: Vital Signs: Blood pressure 111/83, pulse 81, temperature 98.6 F (37 C), temperature source Oral, resp. rate 20, height 6\' 1"  (1.854 m), weight 98.4 kg (216 lb 14.9 oz), SpO2 100 %. No results found. No results for input(s): WBC, HGB, HCT, PLT in the last 72 hours. No results for input(s): NA, K, CL, GLUCOSE, BUN, CREATININE, CALCIUM in the last 72 hours.  Invalid input(s): CO CBG (last 3)  No results for input(s): GLUCAP in the last 72 hours.  Wt Readings from Last 3 Encounters:  06/15/16 98.4 kg (216 lb 14.9 oz)  05/26/16 102.1 kg (225 lb)    Physical Exam:  Gen: NAD. Vital signs reviewed. HENT: Normocephalic and atraumatic.  Eyes: EOM are normal. No discharge.  Cardiovascular: RRR. No JVD. Respiratory: CTA B. Unlabored. GI: +Distended.  Musculoskeletal: He exhibits no edema or tenderness.   Neurological:  He is alert and oriented.  B/l UE 5/5.  RLE: 0/5 HF to distal  LLE: 2/5 HF, 3-/5 KE and 4-/5 ADF/PF Skin: Skin remains warm and dry. Intact.  Psych: pleasant and appropriate  Assessment/Plan: 1. Functional and mobility deficits secondary to thoracic myelopathy which require 3+ hours per day of interdisciplinary therapy in a comprehensive inpatient rehab setting. Physiatrist is providing close team supervision and 24 hour management of active medical problems listed below. Physiatrist and rehab team continue to assess barriers to discharge/monitor patient progress toward functional and medical goals.  Function:  Bathing Bathing position   Position: Other (comment) (Sitting on BSC)  Bathing parts Body parts bathed by patient: Right arm, Left  arm, Chest, Abdomen, Front perineal area Body parts bathed by helper: Buttocks, Right upper leg, Left upper leg, Right lower leg, Left lower leg, Back  Bathing assist Assist Level: Touching or steadying assistance(Pt > 75%)   Set up : To obtain items  Upper Body Dressing/Undressing Upper body dressing   What is the patient wearing?: Pull over shirt/dress     Pull over shirt/dress - Perfomed by patient: Thread/unthread right sleeve, Thread/unthread left sleeve, Put head through opening, Pull shirt over trunk          Upper body assist Assist Level: Set up   Set up : To obtain clothing/put away  Lower Body Dressing/Undressing Lower body dressing   What is the patient wearing?: Pants, Non-skid slipper socks Underwear - Performed by patient: Thread/unthread left underwear leg, Pull underwear up/down Underwear - Performed by helper: Thread/unthread right underwear leg, Thread/unthread left underwear leg, Pull underwear up/down Pants- Performed by patient: Thread/unthread right pants leg, Thread/unthread left pants leg Pants- Performed by helper: Thread/unthread right pants leg, Thread/unthread left pants leg, Pull pants up/down Non-skid slipper socks- Performed by patient: Don/doff right sock, Don/doff left sock Non-skid slipper socks- Performed by helper: Don/doff right sock, Don/doff left sock     Shoes - Performed by patient: Don/doff right shoe, Don/doff left shoe, Fasten right Shoes - Performed by helper: Fasten left (assist with placement of LLE and stabilize leg on opposite knee while pt tied shoe) AFO - Performed by patient: Don/doff right AFO     TED Hose - Performed by helper: Don/doff right TED hose, Don/doff left  TED hose  Lower body assist Assist for lower body dressing: Touching or steadying assistance (Pt > 75%)      Toileting Toileting Toileting activity did not occur: No continent bowel/bladder event Toileting steps completed by patient: Performs perineal  hygiene Toileting steps completed by helper: Adjust clothing prior to toileting, Adjust clothing after toileting Toileting Assistive Devices: Grab bar or rail  Toileting assist Assist level: Touching or steadying assistance (Pt.75%)   Transfers Chair/bed transfer Chair/bed transfer activity did not occur: N/A Chair/bed transfer method: Lateral scoot Chair/bed transfer assist level: Supervision or verbal cues Chair/bed transfer assistive device: Armrests, Sliding board     Locomotion Ambulation     Max distance: 5 Assist level: 2 helpers   Wheelchair   Type: Manual Max wheelchair distance: >150 ft Assist Level: No help, No cues, assistive device, takes more than reasonable amount of time  Cognition Comprehension Comprehension assist level: Follows complex conversation/direction with no assist  Expression Expression assist level: Expresses complex ideas: With no assist  Social Interaction Social Interaction assist level: Interacts appropriately with others with medication or extra time (anti-anxiety, antidepressant).  Problem Solving Problem solving assist level: Solves complex problems: With extra time  Memory Memory assist level: Complete Independence: No helper   Medical Problem List and Plan: 1.  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   MRI 2/2 reviewed showing increased in signal intensity throughout thoracic spine  - angiogram c/w avm, could not be completed due to stool in colon  -follow up study on tomorrow.   -continue CIR therapies, PT, OT as possible. 2.  DVT Prophylaxis/Anticoagulation: SCDs. Venous doppler study negative  - holding lovenox pending angio 3. Pain Management: Hydrocodone as needed 4. Hypertension. Norvasc 10 mg daily Monitor when out of bed .  Controlled 2/11 5. Neuropsych: This patient is capable of making decisions on his own behalf.  -provide ego support as needed 6. Skin/Wound Care: Routine skin  checks 7. Fluids/Electrolytes/Nutrition:encourage PO 8.CRI stage III. Follow-up chemistries next week after angio 9. Neurogenic bowel bladder.  -no spontaneous voiding other than some incontinence   -dced urecholine   -I/o cath prn  having continent/incontinent bowel movements  Bowel reg increased today in preparation for study tomorrow  10. Gas  Simethicone added for gas   LOS (Days) 20 A FACE TO FACE EVALUATION WAS PERFORMED  Shanyah Gattuso Karis Juba, MD 06/21/2016 9:07 AM

## 2016-06-21 NOTE — Plan of Care (Signed)
Problem: RH BOWEL ELIMINATION Goal: RH STG MANAGE BOWEL WITH ASSISTANCE STG Manage Bowel with min Assistance.   Outcome: Progressing Required mag citrate and soap suds enema, bowel still distended, patient uncomfortble.

## 2016-06-21 NOTE — Progress Notes (Signed)
Patient passing small liquid stools, complaining of abdominal discomfort. Belly distended, hypoactive bowel sounds. Patient concerned about procedure 2/12 and not evacuating bowels adequately for procedure. Notified MD and orders given for mag citrate and soap suds enema. Patient concerned about band of pain at top of abdomen, states has not been addressed since admission. Orders for KUB placed, results unremarkable.

## 2016-06-21 NOTE — Progress Notes (Signed)
Physical Therapy Session Note  Patient Details  Name: Perry Morrison MRN: [id number] Date of Birth: 05/31/1960  Today's Date: 06/21/2016 PT Individual Time: [id number] PT Individual Time Calculation (min): 40 min   Short Term Goals: Week 3:  PT Short Term Goal 1 (Week 3): =LTG downgraded to S w/c level   Skilled Therapeutic Interventions/Progress Updates:    Pt received in bed & agreeable to tx, denying c/o pain. Pt transferred supine>sitting EOB with mod assist for BLE and use of bed rails & HOB elevated. Pt completed slide board transfer bed>w/c with close supervision; therapist provided total assist for w/c set up for time management but pt able to position slide board. [facility] with occasional rest breaks 2/2 BUE fatigue; activity focused on cardiopulmonary endurance training. Pt negotiated ramp with supervision. Educated pt on pressure relief regimen and techniques (pushups vs static hold) with pt return demonstrating. At end of session pt left sitting in w/c in room with visitor present.  Therapy Documentation Precautions:  Precautions Precautions: Fall Precaution Comments: RLE hemiparesis Other Brace/Splint: PRAFO R foot Restrictions Weight Bearing Restrictions: No   See Function Navigator for Current Functional Status.   Therapy/Group: Individual Therapy  Sandi MariscalVictoria M Miller 06/21/2016, 4:29 PM

## 2016-06-22 LAB — CBC
HEMATOCRIT: 36.9 % — AB (ref 39.0–52.0)
HEMOGLOBIN: 12.7 g/dL — AB (ref 13.0–17.0)
MCH: 30 pg (ref 26.0–34.0)
MCHC: 34.4 g/dL (ref 30.0–36.0)
MCV: 87.2 fL (ref 78.0–100.0)
Platelets: 345 10*3/uL (ref 150–400)
RBC: 4.23 MIL/uL (ref 4.22–5.81)
RDW: 13.1 % (ref 11.5–15.5)
WBC: 5.5 10*3/uL (ref 4.0–10.5)

## 2016-06-22 LAB — BASIC METABOLIC PANEL
ANION GAP: 12 (ref 5–15)
BUN: 19 mg/dL (ref 6–20)
CO2: 26 mmol/L (ref 22–32)
Calcium: 9.5 mg/dL (ref 8.9–10.3)
Chloride: 96 mmol/L — ABNORMAL LOW (ref 101–111)
Creatinine, Ser: 0.99 mg/dL (ref 0.61–1.24)
GFR calc Af Amer: >60 mL/min (ref >60)
Glucose, Bld: 148 mg/dL — ABNORMAL HIGH (ref 65–99)
POTASSIUM: 4 mmol/L (ref 3.5–5.1)
SODIUM: 134 mmol/L — AB (ref 135–145)

## 2016-06-22 NOTE — Progress Notes (Signed)
Occupational Therapy Session Note  Patient Details  Name: Perry GraffJamie Morrison MRN: [id number] Date of Birth: 03/12/1961  Today's Date: 06/22/2016 OT Individual Time: 1500-1525 OT Individual Time Calculation (min): 25 min  35 missed minutes secondary to fatigue  Short Term Goals: Week 3:  OT Short Term Goal 1 (Week 3): STG=LTG due to LOS    Skilled Therapeutic Interventions/Progress Updates:    Upon entering the room, pt seated on toilet in bathroom. Pt has performed hygiene himself. Pt needing lifting assistance to stand in STEDY. OT assisting with LB clothing management. Pt remained on STEDY for grooming tasks with set up A in front of mirror. Pt declining further OT intervention at this time secondary to fatigue. STEDY utilized for transfer back to bed. Sit >supine with mod A for B LEs. Pt expresses feelings of being overwhelmed and upset regarding procedure not occurring after being NPO and having bowel prep . Pt supine in bed with call bell and all needed items within reach upon exiting the room.    Therapy Documentation Precautions:  Precautions Precautions: Fall Precaution Comments: RLE hemiparesis Other Brace/Splint: PRAFO R foot Restrictions Weight Bearing Restrictions: No General: General OT Amount of Missed Time: 35 Minutes Vital Signs: Therapy Vitals Temp: 98.8 F (37.1 C) Temp Source: Oral Pulse Rate: 84 Resp: 18 BP: 119/73 Patient Position (if appropriate): Lying Oxygen Therapy SpO2: 99 % O2 Device: Not Delivered Pain:   ADL:   Exercises:   Other Treatments:    See Function Navigator for Current Functional Status.   Therapy/Group: Individual Therapy  Alen BleacherBradsher, Lynasia Meloche P 06/22/2016, 3:41 PM

## 2016-06-22 NOTE — Progress Notes (Signed)
Interventional radiology contacted about procedure schedule today. Patient not on list. Notified Harvel Ricksan Anguilli, PA. Ordered to resume diet at this time.

## 2016-06-22 NOTE — Progress Notes (Addendum)
Patient became tearful when he heard he would not be having procedure today as expected. Expressed extreme frustration, but remained calm. Emotional support given.  Spoke with radiology PA to get details on procedure prep for next procedure. Was told patient can expect NPO status from midnight day before and no special bowel prep will be ordered. Explained to patient. Awaiting call back to know exact date for procedure.   Screened patient for depression, anxiety and suicidal ideation. Denies at this time but says he will let us know if increase. Says he's dealing with it. Will continue to monitor.  Discussed need for continued foley with PA. States he will re-assess in AM with MD.

## 2016-06-22 NOTE — Progress Notes (Signed)
Physical Therapy Session Note  Patient Details  Name: Perry Morrison MRN: [id number] Date of Birth: 02/28/1961  Today's Date: 06/22/2016 PT Individual Time: 1415-1443 PT Individual Time Calculation (min): 28 min   Short Term Goals: Week 3:  PT Short Term Goal 1 (Week 3): =LTG downgraded to S w/c level   Skilled Therapeutic Interventions/Progress Updates:    Session limited due to pt's current emotional state due to frustration with lack of communication and procedure not occurring today as planned. Pt expressed frustration with situation and overall fatigue and emotional support provided throughout. Karleen DolphinUtilized Stedy for transfer out of recliner and onto elevated BSC in bathroom to attempt to have BM. Pt required max assist for sit <> stand attempts and cues and facilitation for weightshifting. Pt requires assist to get BLE on and off of Stedy footplate.  RN notified at end of session pt left on toilet.   Therapy Documentation Precautions:  Precautions Precautions: Fall Precaution Comments: RLE hemiparesis Other Brace/Splint: PRAFO R foot Restrictions Weight Bearing Restrictions: No  Pain:  c/o discomfort in stomach ("tight band") and overall more upset by medical situation and lack of procedure occurring today that supposed to be scheduled.   See Function Navigator for Current Functional Status.   Therapy/Group: Individual Therapy  Perry Morrison, Perry Morrison Darrol PokeBrescia  Perry Morrison, PT, DPT  06/22/2016, 3:56 PM

## 2016-06-22 NOTE — Progress Notes (Signed)
Called by patient's nurse wanting to know about plans for a repeat spinal angiogram.  I know nothing about plans for a repeat procedure, so I have contacted Perry Morrison, Dr. Fatima Sangereveshwar's scheduler, who does state that Dr. Algis Downs does want to redo part of the procedure, but that it would not be until sometime next week.  She is in the process of scheduling the procedure with anesthesia this week for a repeat time next week some time.  The patient may continue all rehab goals and Perry Morrison will inform the patient of when the repeat procedure is set up.  Candice Lunney E 10:49 AM 06/22/2016

## 2016-06-22 NOTE — Progress Notes (Signed)
[place] PHYSICAL MEDICINE & REHABILITATION     PROGRESS NOTE    Subjective/Complaints:  No new complaints. Multiple bm's for prep of angio. "gas" better  ROS: pt denies nausea, vomiting, diarrhea, cough, shortness of breath or chest pain  Objective: Vital Signs: Blood pressure 113/74, pulse 73, temperature 98.2 F (36.8 C), temperature source Oral, resp. rate 18, height 6\' 1"  (1.854 m), weight 98.4 kg (216 lb 14.9 oz), SpO2 99 %. Dg Abd Portable 1v  Result Date: 06/21/2016 CLINICAL DATA:  Abdominal distension for 1 month. EXAM: PORTABLE ABDOMEN - 1 VIEW COMPARISON:  None. FINDINGS: Air-filled colon is identified without gross distention. No small bowel dilatation. No other abnormalities. IMPRESSION: No evidence of obstruction.  Mild ileus not excluded. Electronically Signed   By: Gerome Samavid  Williams III M.D   On: 06/21/2016 14:50   No results for input(s): WBC, HGB, HCT, PLT in the last 72 hours. No results for input(s): NA, K, CL, GLUCOSE, BUN, CREATININE, CALCIUM in the last 72 hours.  Invalid input(s): CO CBG (last 3)  No results for input(s): GLUCAP in the last 72 hours.  Wt Readings from Last 3 Encounters:  06/15/16 98.4 kg (216 lb 14.9 oz)  05/26/16 102.1 kg (225 lb)    Physical Exam:  Gen: NAD. Vital signs reviewed. HENT: Normocephalic and atraumatic.  Eyes: EOM are normal. No discharge.  Cardiovascular: RRR no JVD Respiratory: CTA B. Unlabored. GI: ND,NT  Musculoskeletal: He exhibits no edema or tenderness.   Neurological:  He is alert and oriented.  B/l UE 5/5.  RLE: 0/5 HF to distal--no change  LLE: 2/5 HF, 3-/5 KE and 4-/5 ADF/PF Skin: Skin remains warm and dry. Intact.  Psych: pleasant and appropriate  Assessment/Plan: 1. Functional and mobility deficits secondary to thoracic myelopathy which require 3+ hours per day of interdisciplinary therapy in a comprehensive inpatient rehab setting. Physiatrist is providing close team supervision and 24 hour  management of active medical problems listed below. Physiatrist and rehab team continue to assess barriers to discharge/monitor patient progress toward functional and medical goals.  Function:  Bathing Bathing position   Position: Other (comment) (Sitting on BSC)  Bathing parts Body parts bathed by patient: Right arm, Left arm, Chest, Abdomen, Front perineal area Body parts bathed by helper: Buttocks, Right upper leg, Left upper leg, Right lower leg, Left lower leg, Back  Bathing assist Assist Level: Touching or steadying assistance(Pt > 75%)   Set up : To obtain items  Upper Body Dressing/Undressing Upper body dressing   What is the patient wearing?: Pull over shirt/dress     Pull over shirt/dress - Perfomed by patient: Thread/unthread right sleeve, Thread/unthread left sleeve, Put head through opening, Pull shirt over trunk          Upper body assist Assist Level: Set up   Set up : To obtain clothing/put away  Lower Body Dressing/Undressing Lower body dressing   What is the patient wearing?: Pants, Non-skid slipper socks Underwear - Performed by patient: Thread/unthread left underwear leg, Pull underwear up/down Underwear - Performed by helper: Thread/unthread right underwear leg, Thread/unthread left underwear leg, Pull underwear up/down Pants- Performed by patient: Thread/unthread right pants leg, Thread/unthread left pants leg Pants- Performed by helper: Thread/unthread right pants leg, Thread/unthread left pants leg, Pull pants up/down Non-skid slipper socks- Performed by patient: Don/doff right sock, Don/doff left sock Non-skid slipper socks- Performed by helper: Don/doff right sock, Don/doff left sock     Shoes - Performed by patient: Don/doff right shoe, Don/doff  left shoe, Fasten right Shoes - Performed by helper: Fasten left (assist with placement of LLE and stabilize leg on opposite knee while pt tied shoe) AFO - Performed by patient: Don/doff right AFO     TED  Hose - Performed by helper: Don/doff right TED hose, Don/doff left TED hose  Lower body assist Assist for lower body dressing: Touching or steadying assistance (Pt > 75%)      Toileting Toileting Toileting activity did not occur: No continent bowel/bladder event Toileting steps completed by patient: Performs perineal hygiene Toileting steps completed by helper: Adjust clothing prior to toileting, Adjust clothing after toileting, Performs perineal hygiene Toileting Assistive Devices: Grab bar or rail  Toileting assist Assist level: Touching or steadying assistance (Pt.75%)   Transfers Chair/bed transfer Chair/bed transfer activity did not occur: N/A Chair/bed transfer method: Lateral scoot Chair/bed transfer assist level: Supervision or verbal cues Chair/bed transfer assistive device: Sliding board     Locomotion Ambulation     Max distance: 5 Assist level: 2 helpers   Wheelchair   Type: Manual Max wheelchair distance: >300 ft Assist Level: Supervision or verbal cues  Cognition Comprehension Comprehension assist level: Follows complex conversation/direction with no assist  Expression Expression assist level: Expresses complex ideas: With no assist  Social Interaction Social Interaction assist level: Interacts appropriately with others - No medications needed.  Problem Solving Problem solving assist level: Solves complex problems: Recognizes & self-corrects  Memory Memory assist level: Complete Independence: No helper   Medical Problem List and Plan: 1.  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   MRI 2/2 reviewed showing increased in signal intensity throughout thoracic spine  - angiogram c/w avm, could not be completed due to stool in colon    -follow up study today   - CIR therapies, PT, OT on hold today 2.  DVT Prophylaxis/Anticoagulation: SCDs. Venous doppler study negative  - holding lovenox pending angio 3. Pain  Management: Hydrocodone as needed 4. Hypertension. Norvasc 10 mg daily Monitor when out of bed .  Controlled 2/11 5. Neuropsych: This patient is capable of making decisions on his own behalf.  -provide ego support as needed 6. Skin/Wound Care: Routine skin checks 7. Fluids/Electrolytes/Nutrition:encourage PO 8.CRI stage III. Follow-up chemistries next week after angio 9. Neurogenic bowel bladder.  -no spontaneous voiding other than some incontinence   -dced urecholine   -I/o cath prn  having continent/incontinent bowel movements  Bowel reg increased  in preparation for study   10. Gas  Simethicone added for gas   LOS (Days) 21 A FACE TO FACE EVALUATION WAS PERFORMED  [name], MD 06/22/2016 8:59 AM

## 2016-06-23 ENCOUNTER — Inpatient Hospital Stay (HOSPITAL_COMMUNITY): Payer: Self-pay

## 2016-06-23 ENCOUNTER — Inpatient Hospital Stay (HOSPITAL_COMMUNITY): Payer: Self-pay | Admitting: Physical Therapy

## 2016-06-23 ENCOUNTER — Inpatient Hospital Stay (HOSPITAL_COMMUNITY): Payer: Medicaid Other | Admitting: Physical Therapy

## 2016-06-23 ENCOUNTER — Encounter (HOSPITAL_COMMUNITY): Payer: Self-pay | Admitting: General Surgery

## 2016-06-23 ENCOUNTER — Inpatient Hospital Stay (HOSPITAL_COMMUNITY): Payer: Medicaid Other | Admitting: Occupational Therapy

## 2016-06-23 NOTE — Progress Notes (Signed)
Social Work Patient ID: Perry Morrison, male   DOB: [birth year], 56 y.o.   MRN: [id number]   Met briefly with pt immediately following conference.  Pt aware that team and MD continue to await direction from neurology and IR as hospital transfer being considered as well as planned angio on Friday.  Pt appears discouraged about continued loss of function and slow planning on further medical tests/ procedures.  Will follow for d/c planning issues as medical plan develops over next few days.  Cinthia Rodden, LCSW

## 2016-06-23 NOTE — Progress Notes (Signed)
Patient ID: Perry Morrison, male   DOB: 06/15/1960, 56 y.o.   MRN: [id number]    Referring Physician(s): Dr. Ritta SlotMcNeill Kirkpatrick  Supervising Physician: Julieanne Cottoneveshwar, Sanjeev  [facility]  Chief Complaint: Lower extremity weakness  Subjective: The patient is well-known to our service as he underwent a spinal angiogram last week to evaluate for AVMs of the spinal area to determine a cause of his leg weakness.  Unfortunately, we were not able to complete the procedure as his stool and gas burden prevented Dr. Corliss Skainseveshwar from being able to see the last several spinal levels.  We will reattempt this procedure again on Friday after the patient has been cleaned out.    The patient states he is certainly no better than last week.  He feels as if his right leg is not able to be controlled at all and his left leg is worsening, but not as far along as his right.  He still have a foley catheter in place, but is able to feel when he needs to have a BM.  He had a bowel prep over the weekend.  Patient has also requested the possibility of transfer to a tertiary care center for further work up.  Allergies: No known allergies  Medications: Prior to Admission medications   Medication Sig Start Date End Date Taking? Authorizing Provider  amLODipine (NORVASC) 10 MG tablet Take 1 tablet (10 mg total) by mouth daily. 06/02/16  Yes Ripudeep Jenna LuoK Rai, MD  hydrocortisone (ANUSOL-HC) 25 MG suppository Place 1 suppository (25 mg total) rectally daily. 06/02/16  Yes Ripudeep Jenna LuoK Rai, MD  MAGNESIUM PO Take 1 tablet by mouth daily.   Yes Historical Provider, MD  Omega-3 Fatty Acids (OMEGA-3 PO) Take 1 capsule by mouth daily.   Yes Historical Provider, MD  pantoprazole (PROTONIX) 40 MG tablet Take 1 tablet (40 mg total) by mouth daily. 06/02/16  Yes Ripudeep Jenna LuoK Rai, MD  polyethylene glycol (MIRALAX / GLYCOLAX) packet Take 34 g by mouth 2 (two) times daily. 06/01/16  Yes Ripudeep Jenna LuoK Rai, MD    senna-docusate (SENOKOT-S) 8.6-50 MG tablet Take 1 tablet by mouth 2 (two) times daily. 06/01/16  Yes Ripudeep Jenna LuoK Rai, MD    Vital Signs: BP 96/63 (BP Location: Left Arm)   Pulse 64   Temp 98 F (36.7 C) (Oral)   Resp 18   Ht 6\' 1"  (1.854 m)   Wt 216 lb 14.9 oz (98.4 kg)   SpO2 100%   BMI 28.62 kg/m   Physical Exam: Gen: NAD Heart: regular, rate, and rhythm Lungs: CTAB Abd: soft, mild tenderness, (band like tightness around abdomen), +BS, ND Neuro: altered sensation to gross touch in his lower extremities, right worse than left.  Unable to move right leg at all.  Some mobility of left leg, but very gross and difficult.  Imaging: Dg Abd Portable 1v  Result Date: 06/21/2016 CLINICAL DATA:  Abdominal distension for 1 month. EXAM: PORTABLE ABDOMEN - 1 VIEW COMPARISON:  None. FINDINGS: Air-filled colon is identified without gross distention. No small bowel dilatation. No other abnormalities. IMPRESSION: No evidence of obstruction.  Mild ileus not excluded. Electronically Signed   By: Gerome Samavid  Williams III M.D   On: 06/21/2016 14:50    Labs:  CBC:  Recent Labs  06/02/16 1014 06/12/16 1146 06/16/16 0731 06/22/16 1430  WBC 11.9* 8.0 5.8 5.5  HGB 14.1 14.5 12.6* 12.7*  HCT 40.9 42.6 37.0* 36.9*  PLT 207 203 168 345  COAGS:  Recent Labs  06/16/16 0731  INR 1.05  APTT 24    BMP:  Recent Labs  06/03/16 0547 06/12/16 1146 06/16/16 0731 06/22/16 1430  NA 136 132* 135 134*  K 4.6 4.1 4.3 4.0  CL 97* 95* 99* 96*  CO2 31 27 26 26   GLUCOSE 96 112* 115* 148*  BUN 27* 27* 18 19  CALCIUM 9.0 9.6 9.3 9.5  CREATININE 1.19 1.07 0.97 0.99  GFRNONAA >60 >60 >60 >60  GFRAA >60 >60 >60 >60    LIVER FUNCTION TESTS:  Recent Labs  05/23/16 2229 06/02/16 1014 06/16/16 0731  BILITOT 0.7 1.2 1.0  AST 26 108* 60*  ALT 27 316* 170*  ALKPHOS 71 65 91  PROT 7.6 6.6 6.3*  ALBUMIN 4.5 3.5 3.4*    Assessment and Plan: 1. Lower extremity weakness, incomplete  paraplegia -we will plan to perform a selective spinal angiogram on Friday 2/16 to complete his evaluation of his arteriovenous connections to evaluate for malformation or leakage.  If this is present, then we may intervene and treat. -check KUB on Thursday prior to the procedure to make sure he does not need any further bowel prep. -orders will be written in the chart to reflect the procedure on Friday. -Risks and Benefits discussed with the patient including, but not limited to bleeding, infection, vascular injury or contrast induced renal failure. All of the patient's questions were answered, patient is agreeable to proceed. Consent signed and in chart.  Electronically Signed: Letha Cape 06/23/2016, 12:26 PM   I spent a total of 25 Minutes at the the patient's bedside AND on the patient's hospital floor or unit, greater than 50% of which was counseling/coordinating care for lower extremity weakness

## 2016-06-23 NOTE — Progress Notes (Signed)
Physical Therapy Session Note  Patient Details  Name: Perry Morrison MRN: [id number] Date of Birth: 03/17/1961  Today's Date: 06/23/2016 PT Individual Time: 1015-1100 PT Individual Time Calculation (min): 45 min   Short Term Goals: Week 3:  PT Short Term Goal 1 (Week 3): =LTG downgraded to S w/c level   Skilled Therapeutic Interventions/Progress Updates: Tx 1: Pt received seated in bed, denies pain and agreeable to treatment with encouragement. Pt verbalizing discontent with yesterdays events and discussing possibility of transfer to another facility. Offered emotional support, and discussed pt's current functional mobility level at S level without significant decline in several days. Supine>sit with HOB elevated, bedrails and leg lifter for RLE. Transfer bed>w/c with transfer board and setupA for board placement after pt began transferring but board stuck on shorts. Seated in w/c at sink pt performs grooming modI. W/c propulsion with S on level surface for UE strengthening and endurance. Transfer w/c >recliner with stedy with modA sit >stand. Remained seated in recliner at end of session, all needs in reach.   Tx 2: Therapist returned to make up 15 min from previous session; pt declines participation due to late lunch. Will continue per POC.      Therapy Documentation Precautions:  Precautions Precautions: Fall Precaution Comments: RLE hemiparesis Other Brace/Splint: PRAFO R foot Restrictions Weight Bearing Restrictions: No General: PT Amount of Missed Time (min): 15 Minutes Pain: Pain Assessment Pain Assessment: No/denies pain   See Function Navigator for Current Functional Status.   Therapy/Group: Individual Therapy  [name] 06/23/2016, 12:02 PM

## 2016-06-23 NOTE — Progress Notes (Signed)
PHYSICAL MEDICINE & REHABILITATION     PROGRESS NOTE    Subjective/Complaints:  Pt eating breakfast. Pt upset about the fact that angio wasn't performed yesterday. Having "band-like" tightness around chest/abdomen.   ROS: pt denies nausea, vomiting, diarrhea, cough, shortness of breath or chest pain   Objective: Vital Signs: Blood pressure 96/63, pulse 64, temperature 98 F (36.7 C), temperature source Oral, resp. rate 18, height 6\' 1"  (1.854 m), weight 98.4 kg (216 lb 14.9 oz), SpO2 100 %. Dg Abd Portable 1v  Result Date: 06/21/2016 CLINICAL DATA:  Abdominal distension for 1 month. EXAM: PORTABLE ABDOMEN - 1 VIEW COMPARISON:  None. FINDINGS: Air-filled colon is identified without gross distention. No small bowel dilatation. No other abnormalities. IMPRESSION: No evidence of obstruction.  Mild ileus not excluded. Electronically Signed   By: Gerome Samavid  Williams III M.D   On: 06/21/2016 14:50    Recent Labs  06/22/16 1430  WBC 5.5  HGB 12.7*  HCT 36.9*  PLT 345    Recent Labs  06/22/16 1430  NA 134*  K 4.0  CL 96*  GLUCOSE 148*  BUN 19  CREATININE 0.99  CALCIUM 9.5   CBG (last 3)  No results for input(s): GLUCAP in the last 72 hours.  Wt Readings from Last 3 Encounters:  06/15/16 98.4 kg (216 lb 14.9 oz)  05/26/16 102.1 kg (225 lb)    Physical Exam:  Gen: NAD. Vital signs reviewed. HENT: Normocephalic and atraumatic.  Eyes: EOM are normal. No discharge.  Cardiovascular:RRR Respiratory: CTA B. GI: ND,NT  Musculoskeletal: He exhibits no edema or tenderness.   Neurological:  He is alert and oriented.  B/l UE 5/5.  RLE: 0/5 HF to distal--no change  LLE: 2/5 HF, 2/5 KE and 3+/5 ADF/PF -emerging tone in LE's especially left side--1-2/4. DTR's 3+ Skin: Skin remains warm and dry. Intact.  Psych: pleasant and attentive  Assessment/Plan: 1. Functional and mobility deficits secondary to thoracic myelopathy which require 3+ hours per day of  interdisciplinary therapy in a comprehensive inpatient rehab setting. Physiatrist is providing close team supervision and 24 hour management of active medical problems listed below. Physiatrist and rehab team continue to assess barriers to discharge/monitor patient progress toward functional and medical goals.  Function:  Bathing Bathing position   Position: Other (comment) (Sitting on BSC)  Bathing parts Body parts bathed by patient: Right arm, Left arm, Chest, Abdomen, Front perineal area Body parts bathed by helper: Buttocks, Right upper leg, Left upper leg, Right lower leg, Left lower leg, Back  Bathing assist Assist Level: Touching or steadying assistance(Pt > 75%)   Set up : To obtain items  Upper Body Dressing/Undressing Upper body dressing   What is the patient wearing?: Pull over shirt/dress     Pull over shirt/dress - Perfomed by patient: Thread/unthread right sleeve, Thread/unthread left sleeve, Put head through opening, Pull shirt over trunk          Upper body assist Assist Level: Set up   Set up : To obtain clothing/put away  Lower Body Dressing/Undressing Lower body dressing   What is the patient wearing?: Pants, Non-skid slipper socks Underwear - Performed by patient: Thread/unthread left underwear leg, Pull underwear up/down Underwear - Performed by helper: Thread/unthread right underwear leg, Thread/unthread left underwear leg, Pull underwear up/down Pants- Performed by patient: Thread/unthread right pants leg, Thread/unthread left pants leg Pants- Performed by helper: Thread/unthread right pants leg, Thread/unthread left pants leg, Pull pants up/down Non-skid slipper socks- Performed by patient: Don/doff  right sock, Don/doff left sock Non-skid slipper socks- Performed by helper: Don/doff right sock, Don/doff left sock     Shoes - Performed by patient: Don/doff right shoe, Don/doff left shoe, Fasten right Shoes - Performed by helper: Fasten left (assist with  placement of LLE and stabilize leg on opposite knee while pt tied shoe) AFO - Performed by patient: Don/doff right AFO     TED Hose - Performed by helper: Don/doff right TED hose, Don/doff left TED hose  Lower body assist Assist for lower body dressing: Touching or steadying assistance (Pt > 75%)      Toileting Toileting Toileting activity did not occur: No continent bowel/bladder event Toileting steps completed by patient: Performs perineal hygiene Toileting steps completed by helper: Adjust clothing prior to toileting, Adjust clothing after toileting Toileting Assistive Devices: Grab bar or rail  Toileting assist Assist level: Touching or steadying assistance (Pt.75%)   Transfers Chair/bed transfer Chair/bed transfer activity did not occur: N/A Chair/bed transfer method: Lateral scoot Chair/bed transfer assist level: Supervision or verbal cues Chair/bed transfer assistive device: Sliding board     Locomotion Ambulation     Max distance: 5 Assist level: 2 helpers   Wheelchair   Type: Manual Max wheelchair distance: >300 ft Assist Level: Supervision or verbal cues  Cognition Comprehension Comprehension assist level: Follows complex conversation/direction with no assist  Expression Expression assist level: Expresses complex ideas: With no assist  Social Interaction Social Interaction assist level: Interacts appropriately with others - No medications needed.  Problem Solving Problem solving assist level: Solves complex problems: Recognizes & self-corrects  Memory Memory assist level: Complete Independence: No helper   Medical Problem List and Plan: 1.  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   MRI 2/2 reviewed showing increased in signal intensity throughout thoracic spine  - angiogram c/w avm, could not be completed due to stool in colon    -follow up angio not performed. Unclear as to why as I was told last week this was  the plan  -Pt is displaying worsening neurological symptoms including increased weakness, spasticity, and sensory symptoms in the trunk and LE's.   -have contacted neurology regarding follow up and plan. Pt may need work up at [facility]. Pt in fact requests this 2.  DVT Prophylaxis/Anticoagulation: SCDs. Venous doppler study negative  - holding lovenox still 3. Pain Management: Hydrocodone as needed 4. Hypertension. Norvasc 10 mg daily Monitor when out of bed .  Controlled 2/11 5. Neuropsych: This patient is capable of making decisions on his own behalf.  -provide ego support as needed 6. Skin/Wound Care: Routine skin checks 7. Fluids/Electrolytes/Nutrition:encourage PO 8.CRI stage III. Follow-up chemistries next week after angio 9. Neurogenic bowel bladder.  -no spontaneous voiding other than some incontinence   -dced urecholine   -I/o cath prn  -bm's over weekend      10. Gas  Simethicone added for gas   LOS (Days) 22 A FACE TO FACE EVALUATION WAS PERFORMED  [name], MD 06/23/2016 8:49 AM

## 2016-06-23 NOTE — Progress Notes (Deleted)
Subjective: Interval History: none.  Thinks lower limb weakness is improving.  Objective: Vital signs in last 24 hours: Temp:  [97.8 F (36.6 C)-98 F (36.7 C)] 97.8 F (36.6 C) (02/13 1453) Pulse Rate:  [64-77] 77 (02/13 1453) Resp:  [18-20] 20 (02/13 1453) BP: (96-121)/(63-82) 121/82 (02/13 1453) SpO2:  [100 %] 100 % (02/13 1453)  Intake/Output from previous day: 02/12 0701 - 02/13 0700 In: 480 [P.O.:480] Out: 1450 [Urine:1450] Intake/Output this shift: No intake/output data recorded. Nutritional status: Diet heart healthy/carb modified Room service appropriate? Yes; Fluid consistency: Thin Diet NPO time specified Except for: Sips with Meds  NEURO  Lab Results:  Recent Labs  06/22/16 1430  WBC 5.5  HGB 12.7*  HCT 36.9*  PLT 345  NA 134*  K 4.0  CL 96*  CO2 26  GLUCOSE 148*  BUN 19  CREATININE 0.99  CALCIUM 9.5   Lipid Panel No results for input(s): CHOL, TRIG, HDL, CHOLHDL, VLDL, LDLCALC in the last 72 hours.  Studies/Results: No results found.  Medications: I have reviewed the patient's current medications.  Assessment/Plan: 1. Proximal leg weakness This is likely not from statin myopathy given how long patient has been on statin.  Other myositis possible though less likely given improving Ck.  A thoracic cord lesion is also unlikely but this has not been imaged.  Would suggest MRI t-spine to complete imaging of the neuro-axis.  If this is negative, would consider MRI of ilio-psoas muscles to evaluate for myositis.  If possible, would also get EMG/NCS.  Depending on the above results, a muscle and nerve biopsy may be considered.   LOS: 22 days   Dr. Benedict NeedyNath [facility] [phone]  06/23/2016, 7:11 PM

## 2016-06-23 NOTE — Plan of Care (Signed)
Problem: RH Tub/Shower Transfers Goal: LTG Patient will perform tub/shower transfers w/assist (OT) LTG: Patient will perform tub/shower transfers with assist, with/without cues using equipment (OT)  Outcome: Not Applicable Date Met: 77/93/96 Goal d/c due to pt progress and bathroom not accessible from w/c level at home. AL 2/13

## 2016-06-23 NOTE — Progress Notes (Signed)
Physical Therapy Session Note  Patient Details  Name: Perry Morrison MRN: [id number] Date of Birth: 09/14/1960  Today's Date: 06/23/2016 PT Individual Time: 1500-1530 PT Individual Time Calculation (min): 30 min   Short Term Goals: Week 3:  PT Short Term Goal 1 (Week 3): =LTG downgraded to S w/c level   Skilled Therapeutic Interventions/Progress Updates:    session focused on functional bed mobility using leg lifter to manage RLE, slideboard and squat pivot transfers with overall supervision, w/c propulsion on unit mod I and neuro re-ed on Nustep (with RLE adaptive piece) for reciprocal movement pattern re-training and pt focusing on motor control on L to maintain neutral position without adaptive piece. Handoff to RT at end of session.  Therapy Documentation Precautions:  Precautions Precautions: Fall Precaution Comments: RLE hemiparesis Other Brace/Splint: PRAFO R foot Restrictions Weight Bearing Restrictions: No  Pain:  c/o LE spasms - premedicated.    See Function Navigator for Current Functional Status.   Therapy/Group: Individual Therapy  Karolee StampsGray, Jini Horiuchi Darrol PokeBrescia  Miku Udall B. Sanskriti Greenlaw, PT, DPT  06/23/2016, 3:38 PM

## 2016-06-23 NOTE — Plan of Care (Signed)
Problem: RH BLADDER ELIMINATION Goal: RH STG MANAGE BLADDER WITH ASSISTANCE STG Manage Bladder With min  Assistance   Outcome: Not Progressing foley

## 2016-06-23 NOTE — Progress Notes (Signed)
Occupational Therapy Session Note  Patient Details  Name: Perry Morrison MRN: [id number] Date of Birth: 10/10/1960  Today's Date: 06/23/2016 OT Individual Time: 1324-40100800-0815 and 1300-1400 OT Individual Time Calculation (min): 15 min and 60 min and Today's Date: 06/23/2016 OT Missed Time: 45 Minutes Missed Time Reason: Patient fatigue   Short Term Goals: Week 3:  OT Short Term Goal 1 (Week 3): STG=LTG due to LOS  Skilled Therapeutic Interventions/Progress Updates:    Session One: Pt in supine upon arrival finishing breakfast.PT voiced frustrations with events from yesterday, empathethitc listening provided. Pt understandably frustrated.  Discussed d/c planning and pt des[facility]. Discussed continuum of care and services offered at d/c.  Pt declined OOB tx this morning, desiring to cont to rest. Pt also fearful of further injury spinal cord until he has better understanding of what is wrong. Obtained CSW fax number for home measurement sheet to be sent back from pt's grandmother in case d/c is to grandmother's home as originally planned.  Pt left in supine with all needs in reach.   Session Two: Pt seen for OT session focusing on functional transfers and ADL re-training. Pt sitting up in recliner upon arrival, voicing need to get to toilet.  STEADY with mod-max A to stand for transfer to toilet. Total A for clothing management, pt able to complete hygiene though therapist assisted for thoroughness.  He transitioned back into w/c. Completed grooming tasks seated in w/c mod I. He completed min A sliding board transfer to EOB with assist to place and steady board. Mod A to return to supine. Pt left in supine at end of session, case working entering room as therapist exited.    Therapy Documentation Precautions:  Precautions Precautions: Fall Precaution Comments: RLE hemiparesis Other Brace/Splint: PRAFO R foot Restrictions Weight Bearing  Restrictions: No  See Function Navigator for Current Functional Status.   Therapy/Group: Individual Therapy  [name], Shaylynne Lunt C 06/23/2016, 6:43 AM

## 2016-06-23 NOTE — Patient Care Conference (Signed)
Inpatient RehabilitationTeam Conference and Plan of Care Update Date: 06/23/2016   Time: 2:00 PM    Patient Name: Perry GraffJamie Morrison      Medical Record Number: [id number]  Date of Birth: 06/13/1960 Sex: Male         Room/Bed: 4W02C/4W02C-01 Payor Info: Payor: MEDICAID POTENTIAL / Plan: MEDICAID POTENTIAL / Product Type: *No Product type* /    Admitting Diagnosis: Thoracie Myelopathy MYELITIS  Admit Date/Time:  06/01/2016  4:11 PM Admission Comments: No comment available   Primary Diagnosis:  Incomplete paraplegia (HCC) Principal Problem: Incomplete paraplegia [facility](HCC)  Patient Active Problem List   Diagnosis Date Noted  . Distended abdomen   . Benign essential HTN   . 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 Weakness of right leg   . Neurogenic bowel   . Neurogenic bladder   . Incomplete paraplegia (HCC)   . GI bleed 05/30/2016  . Hypertension 05/26/2016  . Constipation 05/26/2016  . Abnormal MRI, thoracic spine 05/26/2016  . Myelitis (HCC)   . Right leg weakness   . Urinary retention   . AKI (acute kidney injury) (HCC)   . Aneurysm (HCC)   . CKD (chronic kidney disease), stage III     Expected Discharge Date: Expected Discharge Date:  (TBD)  Team Members Present: Physician leading conference: Dr. 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 progressive symptoms in LE. working on transfer to tertiary center for further care. potentially move today  neuro dx, find appropriate level of care  see above   Bowel/Bladder   Bowel program q am  LBM 06/22/16 Abd with periods of distention. Foley catheter.   Continue Bowel regimen/foley catheter  monitor bowel and bladder q shift   Swallow/Nutrition/ Hydration              ADL's   Supervision- min A sliding board transfers; supervision- max A toileting depending on fatigue  Supervision- min A  ADL re-training; activity tolerance; d/c planning   Mobility   close S/min guard slideboard transfers, minA transfers, w/c management   modI w/c <>bed, S car transfers, modI w/c management, d/c'ed gait goals  transfer training, sitting balance, pt/family education in preparation for d/c   Communication             Safety/Cognition/ Behavioral Observations  high fall risk/2+ stedy  continue safety measures   monitor safety q shift   Pain   c/o headache and abdominal pain  pain less than or equal to 3  assess pain level q shift   Skin   no skin breakdown  no skin breadkdown this admission  Assess skin q shift    Rehab Goals Patient on target to meet rehab goals: No Rehab Goals Revised: declining function overall - medical issues being addressed *See Care Plan and progress notes for long and short-term goals.  Barriers to Discharge: progressive deficits. uncomfirmed neurological dx    Possible Resolutions to Barriers:  stabilize medically. see above    Discharge Planning/Teaching Needs:  Pt to d/c home with grandmother who can provide supervision.  Note that pt is hesistant about going to her home and  placing a "burden" on her, however, this remains the plan.      Team Discussion:  Medical issues  continue involving further neuro and IR work up to be scheduled.  Likely not ready for d/c on Saturday as targeted.  Currently at min assist with sliding board transfers but most goals have been downgraded due to continued loss of overall LE functioning.  Of note, all gait goals d/c'd.  Revisions to Treatment Plan:  Most goals downgraded and gait goal d/c'd   Continued Need for Acute Rehabilitation Level of Care: The patient requires daily medical management by a physician with specialized training in physical medicine and rehabilitation for the following  conditions: Daily direction of a multidisciplinary physical rehabilitation program to ensure safe treatment while eliciting the highest outcome that is of practical value to the patient.: Yes Daily medical management of patient stability for increased activity during participation in an intensive rehabilitation regime.: Yes Daily analysis of laboratory values and/or radiology reports with any subsequent need for medication adjustment of medical intervention for : Neurological problems;Urological problems  [name] 06/23/2016, 5:25 PM

## 2016-06-23 NOTE — Progress Notes (Signed)
Physical Therapy Session Note  Patient Details  Name: Perry GraffJamie Azeez MRN: [id number] Date of Birth: 07/09/1960  Today's Date: 06/23/2016 PT Individual Time: 8469-62950915-0942 PT Individual Time Calculation (min): 27 min   Short Term Goals: Week 3:  PT Short Term Goal 1 (Week 3): =LTG downgraded to S w/c level   Skilled Therapeutic Interventions/Progress Updates:   Patient in bed upon arrival, requesting bed level exercises. Emotional support provided throughout session due to continued frustration from yesterday and desire [facility]. Session focused on PROM BLE for heel cords, hamstrings, hip IR, hip ER, hip flexion, and hip adductors for improved muscle extensibility and ROM for positioning and pain management. Performed LLE ankle pumps, heel slides with resisted gross extension, assisted SLR, quad sets, and glute sets, x 10-20 reps each exercise. Patient left semi reclined in bed with needs in reach.   Therapy Documentation Precautions:  Precautions Precautions: Fall Precaution Comments: RLE hemiparesis Other Brace/Splint: PRAFO R foot Restrictions Weight Bearing Restrictions: No Pain: Pain Assessment Pain Assessment: No/denies pain Pain Score: 0-No pain   See Function Navigator for Current Functional Status.   Therapy/Group: Individual Therapy  Khaleem Burchill, Prudencio PairRebecca A 06/23/2016, 9:46 AM

## 2016-06-24 ENCOUNTER — Inpatient Hospital Stay (HOSPITAL_COMMUNITY): Payer: Self-pay | Admitting: Physical Therapy

## 2016-06-24 ENCOUNTER — Inpatient Hospital Stay (HOSPITAL_COMMUNITY): Payer: Medicaid Other | Admitting: Occupational Therapy

## 2016-06-24 MED ORDER — ALPRAZOLAM 0.5 MG PO TABS
0.5000 mg | ORAL_TABLET | Freq: Three times a day (TID) | ORAL | Status: DC | PRN
  Administered 2016-06-24 – 2016-07-06 (×3): 0.5 mg via ORAL
  Filled 2016-06-24 (×3): qty 1

## 2016-06-24 NOTE — Progress Notes (Signed)
Physical Therapy Session Note  Patient Details  Name: Perry GraffJamie Wessell MRN: [id number] Date of Birth: 10/28/1960  Today's Date: 06/24/2016 PT Individual Time: 0900-1030 and 1445-1545 PT Individual Time Calculation (min): 90 min and 60 min (total 150 min)   Short Term Goals: Week 3:  PT Short Term Goal 1 (Week 3): =LTG downgraded to S w/c level   Skilled Therapeutic Interventions/Progress Updates: Tx 1: Pt received supine in bed, c/o "band-like tightening" sensation in abdomen; this is an on-going complain that MD/PA are aware of. Performed bed mobility with S and leg lifter, significantly increased time. Transfer bed>w/c with setupA and increased time, transfer board. Pt performed upper body bathing and dressing at sink w/c level with heavy reliance on UEs for balance, S and slow speed. Lower body bathing at sit <>stand level in stedy with minA to stand, standbyA for balance in high perch in stedy. Lower body dressing performed seated in w/c with setupA and significantly increased time. Transferred to toilet with stedy and maxA. Remained seated on toilet at end of session, pt verbalizes understanding for use of call bell when finished.   Tx 2: Pt received supine in bed, denies pain and agreeable to treatment. Bed mobility with S, leg lifter and bedrails and increased time. Transfer bed<>w/c with S and transfer board. W/c propulsion on level/unlevel surfaces, indoor/outdoor and up/down ramp with S overall. Fatigues and requires rest break periodically. Pt returned to bed at end of session with transfer as above. Sit >supine minA for RLE management. Remained supine in bed at end of session, all needs in reach.      Therapy Documentation Precautions:  Precautions Precautions: Fall Precaution Comments: RLE hemiparesis Other Brace/Splint: PRAFO R foot Restrictions Weight Bearing Restrictions: No Pain: Pain Assessment Pain Assessment: 0-10 Pain Score: 3  Pain Type: Acute pain Pain Location:  Leg Pain Orientation: Right Pain Descriptors / Indicators: Spasm Pain Onset: On-going Patients Stated Pain Goal: 0 Pain Intervention(s): Medication (See eMAR);Repositioned   See Function Navigator for Current Functional Status.   Therapy/Group: Individual Therapy  [name] 06/24/2016, 10:58 AM

## 2016-06-24 NOTE — Progress Notes (Signed)
[place] PHYSICAL MEDICINE & REHABILITATION     PROGRESS NOTE    Subjective/Complaints:  Pt concerned about increasing tightness/band like sensations around abdomen. More spasms in legs.   ROS: pt denies nausea, vomiting, diarrhea, cough, shortness of breath or chest pain    Objective: Vital Signs: Blood pressure (!) 138/98, pulse (!) 58, temperature 97.9 F (36.6 C), temperature source Oral, resp. rate 18, height 6\' 1"  (1.854 m), weight 98.4 kg (216 lb 14.9 oz), SpO2 100 %. No results found.  Recent Labs  06/22/16 1430  WBC 5.5  HGB 12.7*  HCT 36.9*  PLT 345    Recent Labs  06/22/16 1430  NA 134*  K 4.0  CL 96*  GLUCOSE 148*  BUN 19  CREATININE 0.99  CALCIUM 9.5   CBG (last 3)  No results for input(s): GLUCAP in the last 72 hours.  Wt Readings from Last 3 Encounters:  06/15/16 98.4 kg (216 lb 14.9 oz)  05/26/16 102.1 kg (225 lb)    Physical Exam:  Gen: NAD. Vital signs reviewed. HENT: Normocephalic and atraumatic.  Eyes: EOM are normal. No discharge.  Cardiovascular:RRR Respiratory: CTA B. GI: ND,NT  Musculoskeletal: He exhibits no edema or tenderness.   Neurological:  He is alert and oriented.  B/l UE 5/5.  RLE: 0/5 HF to distal--stable  LLE: 2-/5 HF, 2/5 KE and 2/5 ADF/PF--inhibited by tone -emerging tone in LE's especially left side--1-2/4. DTR's 3+ Decreased sensation below level of injury, right leg more affected than left Skin: Skin remains warm and dry. Intact.  Psych: pleasant and attentive  Assessment/Plan: 1. Functional and mobility deficits secondary to thoracic myelopathy which require 3+ hours per day of interdisciplinary therapy in a comprehensive inpatient rehab setting. Physiatrist is providing close team supervision and 24 hour management of active medical problems listed below. Physiatrist and rehab team continue to assess barriers to discharge/monitor patient progress toward functional and medical  goals.  Function:  Bathing Bathing position   Position: Other (comment) (Sitting on BSC)  Bathing parts Body parts bathed by patient: Right arm, Left arm, Chest, Abdomen, Front perineal area Body parts bathed by helper: Buttocks, Right upper leg, Left upper leg, Right lower leg, Left lower leg, Back  Bathing assist Assist Level: Touching or steadying assistance(Pt > 75%)   Set up : To obtain items  Upper Body Dressing/Undressing Upper body dressing   What is the patient wearing?: Pull over shirt/dress     Pull over shirt/dress - Perfomed by patient: Thread/unthread right sleeve, Thread/unthread left sleeve, Put head through opening, Pull shirt over trunk          Upper body assist Assist Level: Set up   Set up : To obtain clothing/put away  Lower Body Dressing/Undressing Lower body dressing   What is the patient wearing?: Pants, Non-skid slipper socks Underwear - Performed by patient: Thread/unthread left underwear leg, Pull underwear up/down Underwear - Performed by helper: Thread/unthread right underwear leg, Thread/unthread left underwear leg, Pull underwear up/down Pants- Performed by patient: Thread/unthread right pants leg, Thread/unthread left pants leg Pants- Performed by helper: Thread/unthread right pants leg, Thread/unthread left pants leg, Pull pants up/down Non-skid slipper socks- Performed by patient: Don/doff right sock, Don/doff left sock Non-skid slipper socks- Performed by helper: Don/doff right sock, Don/doff left sock     Shoes - Performed by patient: Don/doff right shoe, Don/doff left shoe, Fasten right Shoes - Performed by helper: Fasten left (assist with placement of LLE and stabilize leg on opposite knee while  pt tied shoe) AFO - Performed by patient: Don/doff right AFO     TED Hose - Performed by helper: Don/doff right TED hose, Don/doff left TED hose  Lower body assist Assist for lower body dressing: Touching or steadying assistance (Pt > 75%)       Toileting Toileting Toileting activity did not occur: No continent bowel/bladder event Toileting steps completed by patient: Performs perineal hygiene Toileting steps completed by helper: Adjust clothing prior to toileting, Adjust clothing after toileting Toileting Assistive Devices: Grab bar or rail  Toileting assist Assist level: Touching or steadying assistance (Pt.75%)   Transfers Chair/bed transfer Chair/bed transfer activity did not occur: N/A Chair/bed transfer method: Lateral scoot Chair/bed transfer assist level: Supervision or verbal cues Chair/bed transfer assistive device: Armrests, Sliding board     Locomotion Ambulation     Max distance: 5 Assist level: 2 helpers   Wheelchair   Type: Manual Max wheelchair distance: 200 Assist Level: Supervision or verbal cues  Cognition Comprehension Comprehension assist level: Follows complex conversation/direction with no assist  Expression Expression assist level: Expresses complex ideas: With no assist  Social Interaction Social Interaction assist level: Interacts appropriately with others - No medications needed.  Problem Solving Problem solving assist level: Solves complex problems: Recognizes & self-corrects  Memory Memory assist level: Complete Independence: No helper   Medical Problem List and Plan: 1.  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   MRI 2/2 reviewed showing increased in signal intensity throughout thoracic spine  - angiogram c/w avm, could not be completed due to stool in colon    -Pt is displaying worsening neurological symptoms including increased weakness, spasticity, and sensory symptoms in the trunk and LE's.   -I spoke with [facility] PA this morning regarding the timing of this follow up procedure given the patient's declining clinical picture.   -have contacted neurology regarding follow up and plan. Working toward transfer to tertiary center 2.  DVT  Prophylaxis/Anticoagulation: SCDs. Venous doppler study negative  - holding lovenox still 3. Pain Management: Hydrocodone as needed 4. Hypertension. Norvasc 10 mg daily Monitor when out of bed .  Controlled 2/11 5. Neuropsych: This patient is capable of making decisions on his own behalf.  -provide ego support as needed  -pt with significant anxiety related to his clinical condition 6. Skin/Wound Care: Routine skin checks 7. Fluids/Electrolytes/Nutrition:encourage PO 8.CRI stage III. Follow-up chemistries next week after angio 9. Neurogenic bowel bladder.  -no spontaneous voiding other than some incontinence   -dced urecholine   -I/o cath prn  -bm's over weekend      10. Gas  Simethicone added for gas   LOS (Days) 23 A FACE TO FACE EVALUATION WAS PERFORMED  Faith Rogue T, MD 06/24/2016 9:01 AM

## 2016-06-24 NOTE — Progress Notes (Signed)
Notified Dan Angiulli, PA of sediment found in urinary catheter tubing and foley bag.  Encouraged patient to drink fluids to flush kidneys.  Dani Gobbleeardon, Omaira Mellen J, RN

## 2016-06-24 NOTE — Plan of Care (Signed)
Problem: RH BOWEL ELIMINATION Goal: RH STG MANAGE BOWEL WITH ASSISTANCE STG Manage Bowel with min Assistance.   Outcome: Not Progressing Bowel program   Problem: RH BLADDER ELIMINATION Goal: RH STG MANAGE BLADDER WITH ASSISTANCE STG Manage Bladder With min  Assistance   Outcome: Not Progressing foley Goal: RH STG MANAGE BLADDER WITH EQUIPMENT WITH ASSISTANCE STG Manage Bladder With Equipment With min Assistance   Outcome: Not Progressing foley

## 2016-06-24 NOTE — Progress Notes (Signed)
Occupational Therapy Session Note  Patient Details  Name: Perry Morrison MRN: [id number] Date of Birth: 07/21/1960  Today's Date: 06/24/2016 OT Individual Time: 4098-11911330-1439 OT Individual Time Calculation (min): 69 min    Short Term Goals: Week 3:  OT Short Term Goal 1 (Week 3): STG=LTG due to LOS  Skilled Therapeutic Interventions/Progress Updates:    Upon entering the room, pt seated in wheelchair and propelling self to sink for grooming tasks with supervision and needing increased time. Pt reports increased spasms and feeling of " band around chest". Pt declined to exit room via wheelchair and requesting to return to bed secondary to pain. RN notified and medication given. Pt performed slide board transfer from bed >wheelchair with steady assistance. Pt requiring increased time for set up of wheelchair and placement of board. Pt engaged in L LE AROM exercises and OT assisted with R LE stretching in all planes. Pt very emotional this session and upset over hospital course and illness. OT provided therapeutic listening and recommended various coping strategies. Pt remained supine in bed with call bell and all needed items within reach.   Therapy Documentation Precautions:  Precautions Precautions: Fall Precaution Comments: RLE hemiparesis Other Brace/Splint: PRAFO R foot Restrictions Weight Bearing Restrictions: No General:   Vital Signs:  Pain:   ADL:   Exercises:   Other Treatments:    See Function Navigator for Current Functional Status.   Therapy/Group: Individual Therapy  Alen BleacherBradsher, Yostin Malacara P 06/24/2016, 7:26 PM

## 2016-06-24 NOTE — Progress Notes (Signed)
Occupational Therapy Note  Patient Details  Name: Perry Morrison MRN: [id number] Date of Birth: [birth year]  Today's Date: 06/24/2016 OT Individual Time: 4098-1191 OT Individual Time Calculation (min): 30 min    No c/o Pain. Pt seen this session to address transfers and self AROM/PROM exercises for LEs. Pt received in bed and worked on bed mobility with min A to maneuver legs and then slide board to w/c with set up/ S.  From chair, AROM for L knee extension with active and isometric work. Used a long sheet and placed R foot in so pt could passively work on R hip flexion, int/ext rotation and partial knee flex/ext. therapist assisted pt with increased ROM for R knee extension.  Pt in room in w/c with all needs met to prepare for lunch.   [place] 06/24/2016, 1:05 PM

## 2016-06-25 ENCOUNTER — Inpatient Hospital Stay (HOSPITAL_COMMUNITY): Payer: Self-pay | Admitting: Physical Therapy

## 2016-06-25 ENCOUNTER — Other Ambulatory Visit (HOSPITAL_COMMUNITY): Payer: Self-pay | Admitting: *Deleted

## 2016-06-25 ENCOUNTER — Inpatient Hospital Stay (HOSPITAL_COMMUNITY): Payer: Medicaid Other | Admitting: Occupational Therapy

## 2016-06-25 ENCOUNTER — Inpatient Hospital Stay (HOSPITAL_COMMUNITY): Payer: Medicaid Other

## 2016-06-25 MED ORDER — MAGNESIUM CITRATE PO SOLN
1.0000 | Freq: Once | ORAL | Status: AC
Start: 2016-06-25 — End: 2016-06-25
  Administered 2016-06-25: 1 via ORAL
  Filled 2016-06-25: qty 296

## 2016-06-25 NOTE — Progress Notes (Signed)
SSE administered per order with 1000 cc warm water. Tolerated procedure well however only clear water returned. No stool noted, gas expelled with water. Pamelia HoitSharp, Jarelis Ehlert B

## 2016-06-25 NOTE — Progress Notes (Signed)
Patient ID: Aurea GraffJamie Olarte, male   DOB: 05/30/1960, 56 y.o.   MRN: [id number]  Scheduled for spinal angio procedure in IR toomorrow Abd imaging today: IMPRESSION: Diffuse stool throughout colon consistent with stated diagnosis of constipation. No bowel obstruction or free air evident.  Ordered Mag citrate 1bottle and soap suds enema For bowel prep per Dr Corliss Skainseveshwar  RN aware

## 2016-06-25 NOTE — Progress Notes (Addendum)
Physical Therapy Session Note  Patient Details  Name: Perry GraffJamie Kvamme MRN: [id number] Date of Birth: 05/19/1960  Today's Date: 06/25/2016 PT Individual Time: 4098-11911505-1550 and 0815-0930 PT Individual Time Calculation (min): 45 min  And 75 min (total 120 min)  Short Term Goals: Week 3:  PT Short Term Goal 1 (Week 3): =LTG downgraded to S w/c level   Skilled Therapeutic Interventions/Progress Updates: Tx 1: Pt received supine in bed; had just returned from x-ray and had not eaten breakfast and pt requesting to eat before participation in therapy. Therapist returned and pt agreeable to treatment. Supine>sit on EOB with bedrails and HOB elevated, modA for time management d/t urgency to use restroom. Sit >stand in stedy modA from elevated bed. Transferred to toilet totalA in stedy. Therapist managed brief before/after toileting, pt performs hygiene without assist. Threads pants legs with S and leg loops, totalA for pulling pants up while pt performed w/c push up. Shirt donned setupA. Seated in w/c at sink pt performed grooming modI. 2x10 w/c pushups with modI. Performed RLE hip ER stretch in sitting; c/o pain in groin and requests to not continue. Hamstring stretch x4 min with BLE elevated on bed; minimal hip hinge before pt reports feeling adequate stretching sensation. Transfer w/c >recliner totalA in stedy. Remained seated in recliner, LEs elevated and all needs in reach at end of session.   Tx 2: Pt received seated in recliner, denies pain and agreeable to treatment. Sit <>stand in stedy modA. [facility] total[facility]. Performed hygiene setupA, totalA for clothing management. Sit <>stand in stedy x8 reps with min guard faded to S; pt reports "feeling stronger today" and appears more confident in standing. Educated pt in gastroc/soleus stretching with leg lifter while in recliner with LEs extended. Pt remained seated in recliner at end of session, all needs in reach. Made up 15 min from  morning session.     Therapy Documentation Precautions:  Precautions Precautions: Fall Precaution Comments: RLE hemiparesis Other Brace/Splint: PRAFO R foot Restrictions Weight Bearing Restrictions: No   See Function Navigator for Current Functional Status.   Therapy/Group: Individual Therapy  [name] 06/25/2016, 3:42 PM

## 2016-06-25 NOTE — Progress Notes (Signed)
Sediment found in urinary catheter tubing and foley bag, MD was notified yesterday per report and per nursing notes.  Encouraged patient to drink fluids to flush kidneys. Will continue to monitor. Lynnell ChadSilvy Kartel Wolbert. RN

## 2016-06-25 NOTE — Progress Notes (Signed)
Occupational Therapy Session Note  Patient Details  Name: Perry Morrison MRN: [id number] Date of Birth: [birth year]  Today's Date: 06/25/2016 OT Individual Time: 1025-1140 OT Individual Time Calculation (min): 75 min    Short Term Goals: Week 2:  OT Short Term Goal 1 (Week 2): Pt will complete one grooming task in standing to increase functional standing balance/ tolerance OT Short Term Goal 1 - Progress (Week 2): Not met OT Short Term Goal 2 (Week 2): Pt will complete 3/3 toileting tasks with min A OT Short Term Goal 2 - Progress (Week 2): Partly met OT Short Term Goal 3 (Week 2): Pt will transfer to toilet with close supervision to decrease caregiver burden OT Short Term Goal 3 - Progress (Week 2): Met OT Short Term Goal 4 (Week 2): Will initiate shower transfer goal utilizing tub/shower combination with tub transfer bench in prep for d/c OT Short Term Goal 4 - Progress (Week 2): Progressing toward goal Week 3:  OT Short Term Goal 1 (Week 3): STG=LTG due to LOS     Skilled Therapeutic Interventions/Progress Updates:    Pt seen this session to work on UE strength and trunk control with various exercises from seated in the recliner. Pt also had just received an enema waiting for his bowels to move. Integrated exercises to also encourage motility of bowels with repetitive side to side leaning and pushing through arms and twisting of torso. Pt worked with weighted dowel for unsupported (back) challenge while performing shoulder AROM and overhead holds of dowel with slight trunk extension for improved postural control. Pt felt a sudden urge to use the bathroom. Stood to [place] with max A and transferred onto [facility] over toilet with A to manage clothing. Pt was able to have a BM. He cleansed self and then stood to [facility] with max A for A to pull up brief and pants. Pt feeling tired and requested to return to bed.  Pt transferred bed with stedy and mod A to adjust into supine. Pt set up in bed with  pillows and other needs. Pt with all needs met.   Therapy Documentation Precautions:  Precautions Precautions: Fall Precaution Comments: RLE hemiparesis Other Brace/Splint: PRAFO R foot Restrictions Weight Bearing Restrictions: No    Pain:   ADL:    See Function Navigator for Current Functional Status.   Therapy/Group: Individual Therapy  [place] 06/25/2016, 10:07 AM

## 2016-06-25 NOTE — Progress Notes (Addendum)
[place] PHYSICAL MEDICINE & REHABILITATION     PROGRESS NOTE    Subjective/Complaints:  Pt without new issues. Still having spasms in legs. Had bm last night.   ROS: pt denies nausea, vomiting, diarrhea, cough, shortness of breath or chest pain    Objective: Vital Signs: Blood pressure 114/71, pulse 75, temperature 97.9 F (36.6 C), temperature source Oral, resp. rate 20, height 6\' 1"  (1.854 m), weight 97 kg (213 lb 12.8 oz), SpO2 98 %. Dg Abd 1 View  Result Date: 06/25/2016 CLINICAL DATA:  Constipation for 1 month EXAM: ABDOMEN - 1 VIEW COMPARISON:  June 21, 2016 FINDINGS: There is diffuse stool throughout the colon. There is no bowel dilatation or air-fluid level suggesting bowel obstruction. No free air. There are apparent phleboliths in the pelvis. IMPRESSION: Diffuse stool throughout colon consistent with stated diagnosis of constipation. No bowel obstruction or free air evident. Electronically Signed   By: Bretta BangWilliam  Woodruff III M.D.   On: 06/25/2016 07:52    Recent Labs  06/22/16 1430  WBC 5.5  HGB 12.7*  HCT 36.9*  PLT 345    Recent Labs  06/22/16 1430  NA 134*  K 4.0  CL 96*  GLUCOSE 148*  BUN 19  CREATININE 0.99  CALCIUM 9.5   CBG (last 3)  No results for input(s): GLUCAP in the last 72 hours.  Wt Readings from Last 3 Encounters:  06/24/16 97 kg (213 lb 12.8 oz)  05/26/16 102.1 kg (225 lb)    Physical Exam:  Gen: NAD. Vital signs reviewed. HENT: Normocephalic and atraumatic.  Eyes: EOM are normal. No discharge.  Cardiovascular:RRR Respiratory: CTA B. GI: ND,NT  Musculoskeletal: He exhibits no edema or tenderness.   Neurological:  He is alert and oriented.  B/l UE 5/5.  RLE: 0/5 HF to distal--stable  LLE: 2-/5 HF, 2/5 KE and 2/5 ADF/PF--inhibited by tone -emerging tone in LE's especially left side--1-2/4. DTR's 3+ Decreased sensation below level of injury, right leg more affected than left Skin: Skin remains warm and dry. Intact.   Psych: pleasant and attentive  Assessment/Plan: 1. Functional and mobility deficits secondary to thoracic myelopathy which require 3+ hours per day of interdisciplinary therapy in a comprehensive inpatient rehab setting. Physiatrist is providing close team supervision and 24 hour management of active medical problems listed below. Physiatrist and rehab team continue to assess barriers to discharge/monitor patient progress toward functional and medical goals.  Function:  Bathing Bathing position   Position: Other (comment) (Sitting on BSC)  Bathing parts Body parts bathed by patient: Right arm, Left arm, Chest, Abdomen, Front perineal area Body parts bathed by helper: Buttocks, Right upper leg, Left upper leg, Right lower leg, Left lower leg, Back  Bathing assist Assist Level: Touching or steadying assistance(Pt > 75%)   Set up : To obtain items  Upper Body Dressing/Undressing Upper body dressing   What is the patient wearing?: Pull over shirt/dress     Pull over shirt/dress - Perfomed by patient: Thread/unthread right sleeve, Thread/unthread left sleeve, Put head through opening, Pull shirt over trunk          Upper body assist Assist Level: Set up   Set up : To obtain clothing/put away  Lower Body Dressing/Undressing Lower body dressing   What is the patient wearing?: Pants Underwear - Performed by patient: Thread/unthread left underwear leg, Pull underwear up/down Underwear - Performed by helper: Thread/unthread right underwear leg, Thread/unthread left underwear leg, Pull underwear up/down Pants- Performed by patient: Thread/unthread left  pants leg, Thread/unthread right pants leg Pants- Performed by helper: Pull pants up/down Non-skid slipper socks- Performed by patient: Don/doff right sock, Don/doff left sock Non-skid slipper socks- Performed by helper: Don/doff right sock, Don/doff left sock     Shoes - Performed by patient: Don/doff right shoe, Don/doff left shoe,  Fasten right Shoes - Performed by helper: Fasten left (assist with placement of LLE and stabilize leg on opposite knee while pt tied shoe) AFO - Performed by patient: Don/doff right AFO     TED Hose - Performed by helper: Don/doff right TED hose, Don/doff left TED hose  Lower body assist Assist for lower body dressing: Touching or steadying assistance (Pt > 75%) (leg lifter)      Toileting Toileting Toileting activity did not occur: No continent bowel/bladder event Toileting steps completed by patient: Adjust clothing prior to toileting, Performs perineal hygiene Toileting steps completed by helper: Adjust clothing after toileting Toileting Assistive Devices: Grab bar or rail  Toileting assist Assist level: Touching or steadying assistance (Pt.75%)   Transfers Chair/bed transfer Chair/bed transfer activity did not occur: N/A Chair/bed transfer method: Lateral scoot Chair/bed transfer assist level: Touching or steadying assistance (Pt > 75%) Chair/bed transfer assistive device: Armrests, Sliding board     Locomotion Ambulation     Max distance: 5 Assist level: 2 helpers   Wheelchair   Type: Manual Max wheelchair distance: 200 Assist Level: Supervision or verbal cues  Cognition Comprehension Comprehension assist level: Follows complex conversation/direction with no assist  Expression Expression assist level: Expresses complex ideas: With no assist  Social Interaction Social Interaction assist level: Interacts appropriately with others - No medications needed.  Problem Solving Problem solving assist level: Solves complex problems: Recognizes & self-corrects  Memory Memory assist level: Complete Independence: No helper   Medical Problem List and Plan: 1.  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   MRI 2/2 reviewed showing increased in signal intensity throughout thoracic spine    -Pt is displaying worsening neurological symptoms  including increased weakness, spasticity, and sensory symptoms in the trunk and LE's.   -spoke with [facility] yesterday. Who clarified there is not an AVM. Will complete study for full follow through tomorrow.  -Neurology continues to follow. ?re-introduce steroids 2.  DVT Prophylaxis/Anticoagulation: SCDs. Venous doppler study negative  - holding lovenox still 3. Pain Management: Hydrocodone as needed  -baclofen for spasms 4. Hypertension. Norvasc 10 mg daily Monitor when out of bed .  Controlled 2/11 5. Neuropsych: This patient is capable of making decisions on his own behalf.  -provide ego support as needed  -pt with significant anxiety related to his clinical condition 6. Skin/Wound Care: Routine skin checks 7. Fluids/Electrolytes/Nutrition:encourage PO 8.CRI stage III. Follow-up chemistries next week after angio 9. Neurogenic bowel bladder.  -no spontaneous voiding other than some incontinence   -dced urecholine   -I/o cath prn  -bowel prep per INR  -stool still in colon on KUB   Mobility Assessment  Perry Morrison was seen today for the purpose of a mobility assessment for a powered wheelchair. I have reviewed and agree with the detailed PT evaluation. 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 Due to spastic paraplegia and sensory loss, he is unable to utilize a cane, walker. The patient is appropriate for a customized ultra-light weight wheelchair. With an ultra light weight wheelchair he can move independently at a household level and on a limited basis in the community. The chair will also allow the  patient to perform ADL's more easily. The patient is competent to operate the recommended chair on his own and is motivated to utilize the chair on a daily basis.     [name], MD, [facility] Physical Medicine & Rehabilitation          LOS (Days) 24 A FACE TO FACE EVALUATION WAS PERFORMED  [name], MD 06/25/2016 9:25 AM

## 2016-06-26 ENCOUNTER — Inpatient Hospital Stay (HOSPITAL_COMMUNITY): Payer: Self-pay | Admitting: Occupational Therapy

## 2016-06-26 ENCOUNTER — Inpatient Hospital Stay (HOSPITAL_COMMUNITY): Payer: Medicaid Other | Admitting: Certified Registered Nurse Anesthetist

## 2016-06-26 ENCOUNTER — Inpatient Hospital Stay (HOSPITAL_COMMUNITY): Payer: Medicaid Other

## 2016-06-26 ENCOUNTER — Encounter (HOSPITAL_COMMUNITY)
Admission: RE | Disposition: A | Discharge status: Home Health | Payer: Self-pay | Source: Intra-hospital | Attending: Physical Medicine & Rehabilitation

## 2016-06-26 ENCOUNTER — Inpatient Hospital Stay: Admit: 2016-06-26 | Payer: Self-pay | Admitting: Interventional Radiology

## 2016-06-26 ENCOUNTER — Other Ambulatory Visit (HOSPITAL_COMMUNITY): Payer: Self-pay | Admitting: *Deleted

## 2016-06-26 HISTORY — PX: RADIOLOGY WITH ANESTHESIA: SHX6223

## 2016-06-26 HISTORY — PX: IR GENERIC HISTORICAL: IMG1180011

## 2016-06-26 LAB — BASIC METABOLIC PANEL
ANION GAP: 10 (ref 5–15)
BUN: 15 mg/dL (ref 6–20)
CALCIUM: 9.4 mg/dL (ref 8.9–10.3)
CHLORIDE: 98 mmol/L — AB (ref 101–111)
CO2: 28 mmol/L (ref 22–32)
Creatinine, Ser: 1 mg/dL (ref 0.61–1.24)
GFR calc Af Amer: >60 mL/min (ref >60)
GFR calc non Af Amer: >60 mL/min (ref >60)
Glucose, Bld: 107 mg/dL — ABNORMAL HIGH (ref 65–99)
Potassium: 4.1 mmol/L (ref 3.5–5.1)
Sodium: 136 mmol/L (ref 135–145)

## 2016-06-26 SURGERY — RADIOLOGY WITH ANESTHESIA
Anesthesia: Monitor Anesthesia Care

## 2016-06-26 MED ORDER — BACLOFEN 10 MG PO TABS
10.0000 mg | ORAL_TABLET | Freq: Four times a day (QID) | ORAL | Status: DC
  Administered 2016-06-26 – 2016-06-28 (×6): 10 mg via ORAL
  Filled 2016-06-26 (×6): qty 1

## 2016-06-26 MED ORDER — OXYCODONE HCL 5 MG PO TABS: 5.0000 mg | ORAL_TABLET | Freq: Once | ORAL | Status: DC | PRN

## 2016-06-26 MED ORDER — LIDOCAINE HCL 1 % IJ SOLN
INTRAMUSCULAR | Status: AC
  Filled 2016-06-26: qty 20

## 2016-06-26 MED ORDER — LACTATED RINGERS IV SOLN
INTRAVENOUS | Status: DC
  Administered 2016-06-26: 12:00:00 via INTRAVENOUS

## 2016-06-26 MED ORDER — HEPARIN SODIUM (PORCINE) 1000 UNIT/ML IJ SOLN
INTRAMUSCULAR | Status: DC | PRN
  Administered 2016-06-26: 1000 [IU] via INTRAVENOUS

## 2016-06-26 MED ORDER — MIDAZOLAM HCL 5 MG/5ML IJ SOLN
INTRAMUSCULAR | Status: DC | PRN
  Administered 2016-06-26: 1 mg via INTRAVENOUS
  Administered 2016-06-26: 2 mg via INTRAVENOUS

## 2016-06-26 MED ORDER — ONDANSETRON HCL 4 MG/2ML IJ SOLN: 4.0000 mg | Freq: Four times a day (QID) | INTRAMUSCULAR | Status: DC | PRN

## 2016-06-26 MED ORDER — FENTANYL CITRATE (PF) 100 MCG/2ML IJ SOLN
INTRAMUSCULAR | Status: DC | PRN
  Administered 2016-06-26 (×2): 50 ug via INTRAVENOUS

## 2016-06-26 MED ORDER — SODIUM CHLORIDE 0.9 % IV SOLN: INTRAVENOUS | Status: AC

## 2016-06-26 MED ORDER — LACTATED RINGERS IV SOLN
INTRAVENOUS | Status: DC | PRN
  Administered 2016-06-26: 16:00:00 via INTRAVENOUS

## 2016-06-26 MED ORDER — PROPOFOL 500 MG/50ML IV EMUL
INTRAVENOUS | Status: DC | PRN
  Administered 2016-06-26: 10 ug/kg/min via INTRAVENOUS

## 2016-06-26 MED ORDER — OXYCODONE HCL 5 MG/5ML PO SOLN: 5.0000 mg | Freq: Once | ORAL | Status: DC | PRN

## 2016-06-26 MED ORDER — FENTANYL CITRATE (PF) 100 MCG/2ML IJ SOLN: 25.0000 ug | INTRAMUSCULAR | Status: DC | PRN

## 2016-06-26 MED ORDER — IOPAMIDOL (ISOVUE-300) INJECTION 61%
INTRAVENOUS | Status: AC
  Filled 2016-06-26: qty 150

## 2016-06-26 NOTE — Sedation Documentation (Signed)
Report given to PACU RN. Dressing to right groin and pulses checked.

## 2016-06-26 NOTE — OR Nursing (Signed)
Bed rest initiated at 1730.

## 2016-06-26 NOTE — Procedures (Signed)
S/P T9 to L3 bilateral spinal angiogram. RT CFA approach. Findings .Marland Kitchen. No  abnormal AV shunting or vascular blush or prominent arteries or veinous  structures noted at the levels examined.

## 2016-06-26 NOTE — Anesthesia Procedure Notes (Signed)
Procedure Name: MAC Date/Time: 06/26/2016 4:52 PM Performed by: Everlean Cherry A Pre-anesthesia Checklist: Patient identified, Emergency Drugs available, Suction available and Patient being monitored Patient Re-evaluated:Patient Re-evaluated prior to inductionOxygen Delivery Method: Nasal cannula

## 2016-06-26 NOTE — Anesthesia Preprocedure Evaluation (Addendum)
Anesthesia Evaluation  Patient identified by MRN, date of birth, ID band Patient awake    Reviewed: Allergy & Precautions, H&P , NPO status , Patient's Chart, lab work & pertinent test results  Airway Mallampati: II   Neck ROM: full    Dental   Pulmonary neg pulmonary ROS,    breath sounds clear to auscultation       Cardiovascular hypertension,  Rhythm:regular Rate:Normal     Neuro/Psych Bilateral lower extremity weakness and spasticity.    GI/Hepatic   Endo/Other    Renal/GU Renal InsufficiencyRenal disease     Musculoskeletal  (+) Arthritis ,   Abdominal   Peds  Hematology   Anesthesia Other Findings   Reproductive/Obstetrics                             Anesthesia Physical Anesthesia Plan  ASA: II  Anesthesia Plan: MAC   Post-op Pain Management:    Induction: Intravenous  Airway Management Planned: Natural Airway and Simple Face Mask  Additional Equipment:   Intra-op Plan:   Post-operative Plan: Extubation in OR  Informed Consent: I have reviewed the patients History and Physical, chart, labs and discussed the procedure including the risks, benefits and alternatives for the proposed anesthesia with the patient or authorized representative who has indicated his/her understanding and acceptance.     Plan Discussed with: CRNA, Anesthesiologist and Surgeon  Anesthesia Plan Comments:        Anesthesia Quick Evaluation

## 2016-06-26 NOTE — Progress Notes (Signed)
All medications held this morning as patient awaits procedure. Patient currently having spasms in both lower extremities but has no complaints of pain.

## 2016-06-26 NOTE — Sedation Documentation (Signed)
5 fr. Exoseal to right groin 

## 2016-06-26 NOTE — Progress Notes (Signed)
PHYSICAL MEDICINE & REHABILITATION     PROGRESS NOTE    Subjective/Complaints:  Pt awaiting procedure. Reports that spasms are keeping him awake at night. Moving bowels with bowel prep.  ROS: pt denies nausea, vomiting, diarrhea, cough, shortness of breath or chest pain  Objective: Vital Signs: Blood pressure 125/79, pulse 80, temperature 98.3 F (36.8 C), temperature source Oral, resp. rate 18, height 6\' 1"  (1.854 m), weight 97 kg (213 lb 12.8 oz), SpO2 99 %. Dg Abd 1 View  Result Date: 06/25/2016 CLINICAL DATA:  Constipation for 1 month EXAM: ABDOMEN - 1 VIEW COMPARISON:  June 21, 2016 FINDINGS: There is diffuse stool throughout the colon. There is no bowel dilatation or air-fluid level suggesting bowel obstruction. No free air. There are apparent phleboliths in the pelvis. IMPRESSION: Diffuse stool throughout colon consistent with stated diagnosis of constipation. No bowel obstruction or free air evident. Electronically Signed   By: Bretta BangWilliam  Woodruff III M.D.   On: 06/25/2016 07:52   No results for input(s): WBC, HGB, HCT, PLT in the last 72 hours.  Recent Labs  06/26/16 0507  NA 136  K 4.1  CL 98*  GLUCOSE 107*  BUN 15  CREATININE 1.00  CALCIUM 9.4   CBG (last 3)  No results for input(s): GLUCAP in the last 72 hours.  Wt Readings from Last 3 Encounters:  06/24/16 97 kg (213 lb 12.8 oz)  05/26/16 102.1 kg (225 lb)    Physical Exam:  Gen: NAD. Vital signs reviewed. HENT: Normocephalic and atraumatic.  Eyes: EOM are normal. No discharge.  Cardiovascular:RRR Respiratory: CTA B. GI: ND,NT  Musculoskeletal: He exhibits no edema or tenderness.   Neurological:  He is alert and oriented.  B/l UE 5/5.  RLE: 0/5 HF to distal--stable  LLE: 2-/5 HF, 2/5 KE and 2/5 ADF/PF--inhibited by tone -emerging tone in LE's especially left side--1-2/4. DTR's 3+--stable Decreased sensation below level of injury, right leg more affected than left Skin: Skin remains warm  and dry. Intact.  Psych: pleasant and attentive  Assessment/Plan: 1. Functional and mobility deficits secondary to thoracic myelopathy which require 3+ hours per day of interdisciplinary therapy in a comprehensive inpatient rehab setting. Physiatrist is providing close team supervision and 24 hour management of active medical problems listed below. Physiatrist and rehab team continue to assess barriers to discharge/monitor patient progress toward functional and medical goals.  Function:  Bathing Bathing position   Position: Other (comment) (Sitting on BSC)  Bathing parts Body parts bathed by patient: Right arm, Left arm, Chest, Abdomen, Front perineal area Body parts bathed by helper: Buttocks, Right upper leg, Left upper leg, Right lower leg, Left lower leg, Back  Bathing assist Assist Level: Touching or steadying assistance(Pt > 75%)   Set up : To obtain items  Upper Body Dressing/Undressing Upper body dressing   What is the patient wearing?: Pull over shirt/dress     Pull over shirt/dress - Perfomed by patient: Thread/unthread right sleeve, Thread/unthread left sleeve, Put head through opening, Pull shirt over trunk          Upper body assist Assist Level: Set up   Set up : To obtain clothing/put away  Lower Body Dressing/Undressing Lower body dressing   What is the patient wearing?: Pants Underwear - Performed by patient: Thread/unthread left underwear leg, Pull underwear up/down Underwear - Performed by helper: Thread/unthread right underwear leg, Thread/unthread left underwear leg, Pull underwear up/down Pants- Performed by patient: Thread/unthread left pants leg, Thread/unthread right pants leg  Pants- Performed by helper: Pull pants up/down Non-skid slipper socks- Performed by patient: Don/doff right sock, Don/doff left sock Non-skid slipper socks- Performed by helper: Don/doff right sock, Don/doff left sock     Shoes - Performed by patient: Don/doff right shoe,  Don/doff left shoe, Fasten right Shoes - Performed by helper: Fasten left (assist with placement of LLE and stabilize leg on opposite knee while pt tied shoe) AFO - Performed by patient: Don/doff right AFO     TED Hose - Performed by helper: Don/doff right TED hose, Don/doff left TED hose  Lower body assist Assist for lower body dressing: Touching or steadying assistance (Pt > 75%) (leg lifter)      Toileting Toileting Toileting activity did not occur: No continent bowel/bladder event Toileting steps completed by patient: Performs perineal hygiene Toileting steps completed by helper: Adjust clothing prior to toileting, Adjust clothing after toileting Toileting Assistive Devices: Grab bar or rail  Toileting assist Assist level: Touching or steadying assistance (Pt.75%)   Transfers Chair/bed transfer Chair/bed transfer activity did not occur: N/A Chair/bed transfer method: Lateral scoot Chair/bed transfer assist level: Touching or steadying assistance (Pt > 75%) Chair/bed transfer assistive device: Armrests, Sliding board     Locomotion Ambulation     Max distance: 5 Assist level: 2 helpers   Wheelchair   Type: Manual Max wheelchair distance: 200 Assist Level: Supervision or verbal cues  Cognition Comprehension Comprehension assist level: Follows complex conversation/direction with no assist  Expression Expression assist level: Expresses complex ideas: With no assist  Social Interaction Social Interaction assist level: Interacts appropriately with others - No medications needed.  Problem Solving Problem solving assist level: Solves complex problems: Recognizes & self-corrects  Memory Memory assist level: Complete Independence: No helper   Medical Problem List and Plan: 1.  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   MRI 2/2 reviewed showing increased in signal intensity throughout thoracic spine    -Pt is displaying worsening  neurological symptoms including increased weakness, spasticity, and sensory symptoms in the trunk and LE's.   -spoke with [facility] Wednesday who clarified there is not an AVM. Will complete study for full follow through tomorrow.  -Neurology continues to follow. ?re-introduce steroids 2.  DVT Prophylaxis/Anticoagulation: SCDs. Venous doppler study negative  - holding lovenox still 3. Pain Management: Hydrocodone as needed  -baclofen for spasms--increase to qid 4. Hypertension. Norvasc 10 mg daily Monitor when out of bed .  Controlled 2/11 5. Neuropsych: This patient is capable of making decisions on his own behalf.  -provide ego support as needed  -pt with   anxiety related to his clinical condition 6. Skin/Wound Care: Routine skin checks 7. Fluids/Electrolytes/Nutrition:encourage PO 8.CRI stage III. Follow-up chemistries next week after angio 9. Neurogenic bowel bladder.  -no spontaneous voiding other than some incontinence   -dced urecholine   -I/o cath prn  -bowel prep per INR       [name], MD, [facility] Physical Medicine & Rehabilitation          LOS (Days) 25 A FACE TO FACE EVALUATION WAS PERFORMED  [name], MD 06/26/2016 9:26 AM

## 2016-06-26 NOTE — Progress Notes (Signed)
Physical Therapy Session Note  Patient Details  Name: Perry Morrison MRN: [id number] Date of Birth: 04-21-1961  Today's Date: 06/25/2016 PT Individual Time:1340-1420   PT Individual Time Calculation: 40 min   Short Term Goals: Week 3:  PT Short Term Goal 1 (Week 3): =LTG downgraded to S w/c level   Skilled Therapeutic Interventions/Progress Updates:   Pt received supine in bed and requested to finish eating prior to therapy.  PT returned in 5 minutes. Set up assist from PT with SB to [place]. WC mobility to dayroom to participate in [place] party activities. Patient performed WC mobility through cones with supervision assist form PT. Participated in 2 games  Shuffle board to test balance in WC and UE ROM and strengthening. WC mobility through unit 2x 238f without assist from PT. SB transfer to recliner at end of session with set up assist only form PT. Pt left sitting with call bell in reach and all needs met.      Therapy Documentation Precautions:  Precautions Precautions: Fall Precaution Comments: RLE hemiparesis Other Brace/Splint: PRAFO R foot Restrictions Weight Bearing Restrictions: No    Vital Signs: Therapy Vitals Temp: 98.3 F (36.8 C) Temp Source: Oral Pulse Rate: 80 Resp: 18 BP: 125/79 Patient Position (if appropriate): Lying Oxygen Therapy SpO2: 99 % O2 Device: Not Delivered Pain:   0/10 at rest.    See Function Navigator for Current Functional Status.   Therapy/Group: Individual Therapy  [name]2/16/2018, 6:11 AM

## 2016-06-26 NOTE — Progress Notes (Signed)
Patient arrived on floor at 2020. [facility]

## 2016-06-26 NOTE — Transfer of Care (Signed)
Immediate Anesthesia Transfer of Care Note  Patient: Perry Morrison  Procedure(s) Performed: Procedure(s): SPINAL ANGIOGRAM (N/A)  Patient Location: PACU  Anesthesia Type:MAC  Level of Consciousness: awake, alert , oriented and patient cooperative  Airway & Oxygen Therapy: Patient Spontanous Breathing and Patient connected to nasal cannula oxygen  Post-op Assessment: Report given to RN and Post -op Vital signs reviewed and stable  Post vital signs: Reviewed and stable  Last Vitals:  Vitals:   06/25/16 1300 06/26/16 0453  BP: 117/61 125/79  Pulse: 95 80  Resp: 20 18  Temp: 36.5 C 36.8 C    Last Pain:  Vitals:   06/26/16 0453  TempSrc: Oral  PainSc:       Patients Stated Pain Goal: 0 (41/66/06 3016)  Complications: No apparent anesthesia complications

## 2016-06-27 ENCOUNTER — Inpatient Hospital Stay (HOSPITAL_COMMUNITY): Payer: Medicaid Other | Admitting: Occupational Therapy

## 2016-06-27 MED ORDER — METHOCARBAMOL 500 MG PO TABS
500.0000 mg | ORAL_TABLET | Freq: Four times a day (QID) | ORAL | Status: DC | PRN
  Administered 2016-06-28 – 2016-07-06 (×6): 500 mg via ORAL
  Filled 2016-06-27 (×7): qty 1

## 2016-06-27 MED ORDER — ENOXAPARIN SODIUM 30 MG/0.3ML ~~LOC~~ SOLN
30.0000 mg | Freq: Two times a day (BID) | SUBCUTANEOUS | Status: DC
  Administered 2016-06-27 – 2016-06-30 (×7): 30 mg via SUBCUTANEOUS
  Filled 2016-06-27 (×7): qty 0.3

## 2016-06-27 NOTE — Progress Notes (Signed)
Referring Physician(s): Dr Jocelyn LamerZ Swartz  Supervising Physician: Julieanne Cottoneveshwar, Sanjeev  Patient Status:  Perry Morrison - In-pt  Chief Complaint:  B LE weakness Spinal angio 2/6 and additional study 2/16  Subjective:  Up in wc At sink  Denies pain No real change in symptoms   Allergies: No known allergies  Medications: Prior to Admission medications   Medication Sig Start Date End Date Taking? Authorizing Provider  amLODipine (NORVASC) 10 MG tablet Take 1 tablet (10 mg total) by mouth daily. 06/02/16  Yes Ripudeep Jenna LuoK Rai, MD  hydrocortisone (ANUSOL-HC) 25 MG suppository Place 1 suppository (25 mg total) rectally daily. 06/02/16  Yes Ripudeep Jenna LuoK Rai, MD  MAGNESIUM PO Take 1 tablet by mouth daily.   Yes Historical Provider, MD  Omega-3 Fatty Acids (OMEGA-3 PO) Take 1 capsule by mouth daily.   Yes Historical Provider, MD  pantoprazole (PROTONIX) 40 MG tablet Take 1 tablet (40 mg total) by mouth daily. 06/02/16  Yes Ripudeep Jenna LuoK Rai, MD  polyethylene glycol (MIRALAX / GLYCOLAX) packet Take 34 g by mouth 2 (two) times daily. 06/01/16  Yes Ripudeep Jenna LuoK Rai, MD  senna-docusate (SENOKOT-S) 8.6-50 MG tablet Take 1 tablet by mouth 2 (two) times daily. 06/01/16  Yes Ripudeep Jenna LuoK Rai, MD     Vital Signs: BP 131/74 (BP Location: Right Arm)   Pulse 84   Temp 97.6 F (36.4 C) (Oral)   Resp 20   Ht 6\' 1"  (1.854 m)   Wt 213 lb 12.8 oz (97 kg)   SpO2 100%   BMI 28.21 kg/m   Physical Exam  Abdominal: Soft.  Neurological: He is alert.  Skin: Skin is warm.  Rt groin site clean and dry NT no bleeding No hematoma  Rt foot 2+ pulses  Nursing note and vitals reviewed.   Imaging: Dg Abd 1 View  Result Date: 06/25/2016 CLINICAL DATA:  Constipation for 1 month EXAM: ABDOMEN - 1 VIEW COMPARISON:  June 21, 2016 FINDINGS: There is diffuse stool throughout the colon. There is no bowel dilatation or air-fluid level suggesting bowel obstruction. No free air. There are apparent phleboliths in the pelvis.  IMPRESSION: Diffuse stool throughout colon consistent with stated diagnosis of constipation. No bowel obstruction or free air evident. Electronically Signed   By: Bretta BangWilliam  Woodruff III M.D.   On: 06/25/2016 07:52    Labs:  CBC:  Recent Labs  06/02/16 1014 06/12/16 1146 06/16/16 0731 06/22/16 1430  WBC 11.9* 8.0 5.8 5.5  HGB 14.1 14.5 12.6* 12.7*  HCT 40.9 42.6 37.0* 36.9*  PLT 207 203 168 345    COAGS:  Recent Labs  06/16/16 0731  INR 1.05  APTT 24    BMP:  Recent Labs  06/12/16 1146 06/16/16 0731 06/22/16 1430 06/26/16 0507  NA 132* 135 134* 136  K 4.1 4.3 4.0 4.1  CL 95* 99* 96* 98*  CO2 27 26 26 28   GLUCOSE 112* 115* 148* 107*  BUN 27* 18 19 15   CALCIUM 9.6 9.3 9.5 9.4  CREATININE 1.07 0.97 0.99 1.00  GFRNONAA >60 >60 >60 >60  GFRAA >60 >60 >60 >60    LIVER FUNCTION TESTS:  Recent Labs  05/23/16 2229 06/02/16 1014 06/16/16 0731  BILITOT 0.7 1.2 1.0  AST 26 108* 60*  ALT 27 316* 170*  ALKPHOS 71 65 91  PROT 7.6 6.6 6.3*  ALBUMIN 4.5 3.5 3.4*    Assessment and Plan:  Spinal angio without evidence of AV shunting or blush or prominent arteries/veins Plan per  Rehab MD and Neurology  Electronically Signed: Ralene Muskrat A 06/27/2016, 9:12 AM   I spent a total of 15 Minutes at the the patient's bedside AND on the patient's hospital floor or unit, greater than 50% of which was counseling/coordinating care for spinal angiogram

## 2016-06-27 NOTE — Plan of Care (Signed)
Problem: RH BLADDER ELIMINATION Goal: RH STG MANAGE BLADDER WITH ASSISTANCE STG Manage Bladder With min  Assistance   Outcome: Not Progressing Total assist with foley Goal: RH STG MANAGE BLADDER WITH EQUIPMENT WITH ASSISTANCE STG Manage Bladder With Equipment With min Assistance   Outcome: Not Progressing Total assist with Foley

## 2016-06-27 NOTE — Progress Notes (Addendum)
Occupational Therapy Session Note  Patient Details  Name: Perry Morrison MRN: [id number] Date of Birth: 05-29-1960  Today's Date: 06/27/2016 OT Individual Time: 1300-1400 OT Individual Time Calculation (min): 60 min    Short Term Goals: Week 3:  OT Short Term Goal 1 (Week 3): STG=LTG due to LOS     Skilled Therapeutic Interventions/Progress Updates:    Pt received in recliner and stated he had been working his legs this am using leg lifter and completed a transfer earlier into w/c. Pt was having some discomfort in abdomen but willing to engage in therapy. Worked on trunk strengthening exercises while moving arms holding weighted dowel for overhead presses, shoulder rotations with rowing exercise.   Pt c/o intense muscle tightness and spasm behind R scapula. Obtained 2 tennis balls and placed them in long sock so pt could place behind back in recliner and use for deep pressure. Instructed pt how to position balls, level of pressure to feel, time to leave pressure on and gentle movement of back to move muscles over pressure. Pt stated he felt this was very helpful and worked on this self massage for several minutes.   He worked on pressure relief with arm pushups on arm rests with B arms and also alternating from hip to hip. Pt reported that earlier he had done some self ROM with leg lifter for R leg.  Requested to lay down. Transferred to bed via slide board with close S.  A to adjust legs in bed and adjust pillows. Pt in bed with all needs met.   Therapy Documentation Precautions:  Precautions Precautions: Fall Precaution Comments: RLE hemiparesis Other Brace/Splint: PRAFO R foot Restrictions Weight Bearing Restrictions: No  Pain: Pain Assessment Pain Assessment: No/denies pain ADL:    See Function Navigator for Current Functional Status.   Therapy/Group: Individual Therapy  [place] 06/27/2016, 1:13 PM

## 2016-06-27 NOTE — Anesthesia Postprocedure Evaluation (Signed)
Anesthesia Post Note  Patient: Perry Morrison  Procedure(s) Performed: Procedure(s) (LRB): SPINAL ANGIOGRAM (N/A)  Patient location during evaluation: PACU Anesthesia Type: MAC Level of consciousness: awake and alert Pain management: pain level controlled Vital Signs Assessment: post-procedure vital signs reviewed and stable Respiratory status: spontaneous breathing, nonlabored ventilation, respiratory function stable and patient connected to nasal cannula oxygen Cardiovascular status: stable and blood pressure returned to baseline Anesthetic complications: no       Last Vitals:  Vitals:   06/27/16 0157 06/27/16 0423  BP: 126/80 131/74  Pulse: 78 84  Resp: 18 20  Temp: 36.9 C 36.4 C    Last Pain:  Vitals:   06/27/16 0423  TempSrc: Oral  PainSc:                  [name]

## 2016-06-27 NOTE — Progress Notes (Signed)
[place] PHYSICAL MEDICINE & REHABILITATION     PROGRESS NOTE    Subjective/Complaints:  Still with spasms in legs. Able to sleep somewhat last night  ROS: pt denies nausea, vomiting, diarrhea, cough, shortness of breath or chest pain  Objective: Vital Signs: Blood pressure 131/74, pulse 84, temperature 97.6 F (36.4 C), temperature source Oral, resp. rate 20, height 6\' 1"  (1.854 m), weight 97 kg (213 lb 12.8 oz), SpO2 100 %. No results found. No results for input(s): WBC, HGB, HCT, PLT in the last 72 hours.  Recent Labs  06/26/16 0507  NA 136  K 4.1  CL 98*  GLUCOSE 107*  BUN 15  CREATININE 1.00  CALCIUM 9.4   CBG (last 3)  No results for input(s): GLUCAP in the last 72 hours.  Wt Readings from Last 3 Encounters:  06/24/16 97 kg (213 lb 12.8 oz)  05/26/16 102.1 kg (225 lb)    Physical Exam:  Gen: NAD. Vital signs reviewed. HENT: Normocephalic and atraumatic.  Eyes: EOM are normal. No discharge.  Cardiovascular:RRR Respiratory: CTA B GI: ND,NT  Musculoskeletal: He exhibits no edema or tenderness.   Neurological:  He is alert and oriented.  B/l UE 5/5.  RLE: 0/5 HF to distal--stable  LLE: 2-/5 HF, 2/5 KE and 2/5 ADF/PF--remains inhibited by tone -emerging tone in LE's especially left side--1-2/4. DTR's 3+--stable Decreased sensation below level of injury, right leg more affected than left--stable Skin: Skin remains warm and dry. Intact.  Psych: pleasant and attentive  Assessment/Plan: 1. Functional and mobility deficits secondary to thoracic myelopathy which require 3+ hours per day of interdisciplinary therapy in a comprehensive inpatient rehab setting. Physiatrist is providing close team supervision and 24 hour management of active medical problems listed below. Physiatrist and rehab team continue to assess barriers to discharge/monitor patient progress toward functional and medical goals.  Function:  Bathing Bathing position   Position: Other  (comment) (Sitting on BSC)  Bathing parts Body parts bathed by patient: Right arm, Left arm, Chest, Abdomen, Front perineal area Body parts bathed by helper: Buttocks, Right upper leg, Left upper leg, Right lower leg, Left lower leg, Back  Bathing assist Assist Level: Touching or steadying assistance(Pt > 75%)   Set up : To obtain items  Upper Body Dressing/Undressing Upper body dressing   What is the patient wearing?: Pull over shirt/dress     Pull over shirt/dress - Perfomed by patient: Thread/unthread right sleeve, Thread/unthread left sleeve, Put head through opening, Pull shirt over trunk          Upper body assist Assist Level: Set up   Set up : To obtain clothing/put away  Lower Body Dressing/Undressing Lower body dressing   What is the patient wearing?: Pants Underwear - Performed by patient: Thread/unthread left underwear leg, Pull underwear up/down Underwear - Performed by helper: Thread/unthread right underwear leg, Thread/unthread left underwear leg, Pull underwear up/down Pants- Performed by patient: Thread/unthread left pants leg, Thread/unthread right pants leg Pants- Performed by helper: Pull pants up/down Non-skid slipper socks- Performed by patient: Don/doff right sock, Don/doff left sock Non-skid slipper socks- Performed by helper: Don/doff right sock, Don/doff left sock     Shoes - Performed by patient: Don/doff right shoe, Don/doff left shoe, Fasten right Shoes - Performed by helper: Fasten left (assist with placement of LLE and stabilize leg on opposite knee while pt tied shoe) AFO - Performed by patient: Don/doff right AFO     TED Hose - Performed by helper: Don/doff right TED  hose, Don/doff left TED hose  Lower body assist Assist for lower body dressing: Touching or steadying assistance (Pt > 75%) (leg lifter)      Toileting Toileting Toileting activity did not occur: No continent bowel/bladder event Toileting steps completed by patient: Performs  perineal hygiene Toileting steps completed by helper: Adjust clothing prior to toileting, Adjust clothing after toileting Toileting Assistive Devices: Grab bar or rail  Toileting assist Assist level: Touching or steadying assistance (Pt.75%)   Transfers Chair/bed transfer Chair/bed transfer activity did not occur: N/A Chair/bed transfer method: Lateral scoot Chair/bed transfer assist level: Touching or steadying assistance (Pt > 75%) Chair/bed transfer assistive device: Armrests, Sliding board     Locomotion Ambulation     Max distance: 5 Assist level: 2 helpers   Wheelchair   Type: Manual Max wheelchair distance: 200 Assist Level: Supervision or verbal cues  Cognition Comprehension Comprehension assist level: Follows complex conversation/direction with no assist  Expression Expression assist level: Expresses complex ideas: With no assist  Social Interaction Social Interaction assist level: Interacts appropriately with others - No medications needed.  Problem Solving Problem solving assist level: Solves complex problems: Recognizes & self-corrects  Memory Memory assist level: Complete Independence: No helper   Medical Problem List and Plan: 1.  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   -MRI 2/2 reviewed showing increased in signal intensity throughout thoracic spine    -continued LE spasms  -completion of angio revealed no vascular abnormalities.  -Neurology continues to follow. ?re-introduce steroids.  2.  DVT Prophylaxis/Anticoagulation: SCDs. Venous doppler study negative  -resume lovenox 3. Pain Management: Hydrocodone as needed  -baclofen for spasms--increase to qid   -add  4. Hypertension. Norvasc 10 mg daily Monitor when out of bed .  Controlled 2/11 5. Neuropsych: This patient is capable of making decisions on his own behalf.  -provide ego support as needed  -pt with   anxiety related to his clinical condition 6.  Skin/Wound Care: Routine skin checks 7. Fluids/Electrolytes/Nutrition:encourage PO 8.CRI stage III. Follow-up chemistries next week after angio 9. Neurogenic bowel bladder.  -no spontaneous voiding other than some incontinence   -dced urecholine   -I/o cath prn  -bowel prep per INR       Ranelle OysterZachary T. Kyarah Enamorado, MD, [facility] CareFAAPMR [facility]          LOS (Days) 26 A FACE TO FACE EVALUATION WAS PERFORMED  Ranelle OysterSWARTZ,Dallen Bunte T, MD 06/27/2016 8:43 AM

## 2016-06-28 ENCOUNTER — Inpatient Hospital Stay (HOSPITAL_COMMUNITY): Payer: Medicaid Other | Admitting: Physical Therapy

## 2016-06-28 ENCOUNTER — Inpatient Hospital Stay (HOSPITAL_COMMUNITY): Payer: Medicaid Other

## 2016-06-28 ENCOUNTER — Inpatient Hospital Stay (HOSPITAL_COMMUNITY): Payer: Self-pay | Admitting: Physical Therapy

## 2016-06-28 LAB — URINALYSIS, ROUTINE W REFLEX MICROSCOPIC
Bilirubin Urine: NEGATIVE
GLUCOSE, UA: NEGATIVE mg/dL
KETONES UR: NEGATIVE mg/dL
Nitrite: NEGATIVE
PROTEIN: 30 mg/dL — AB
Specific Gravity, Urine: 1.018 (ref 1.005–1.030)
Squamous Epithelial / LPF: NONE SEEN
pH: 5 (ref 5.0–8.0)

## 2016-06-28 MED ORDER — BACLOFEN 5 MG HALF TABLET
15.0000 mg | ORAL_TABLET | Freq: Four times a day (QID) | ORAL | Status: DC
  Administered 2016-06-28 – 2016-06-30 (×8): 15 mg via ORAL
  Filled 2016-06-28 (×8): qty 1

## 2016-06-28 NOTE — Progress Notes (Signed)
[place] PHYSICAL MEDICINE & REHABILITATION     PROGRESS NOTE    Subjective/Complaints:  Still having a lot of spasms in LE's  ROS: pt denies nausea, vomiting, diarrhea, cough, shortness of breath or chest pain   Objective: Vital Signs: Blood pressure 118/75, pulse 76, temperature 98.7 F (37.1 C), temperature source Oral, resp. rate 20, height 6\' 1"  (1.854 m), weight 97 kg (213 lb 12.8 oz), SpO2 99 %. No results found. No results for input(s): WBC, HGB, HCT, PLT in the last 72 hours.  Recent Labs  06/26/16 0507  NA 136  K 4.1  CL 98*  GLUCOSE 107*  BUN 15  CREATININE 1.00  CALCIUM 9.4   CBG (last 3)  No results for input(s): GLUCAP in the last 72 hours.  Wt Readings from Last 3 Encounters:  06/24/16 97 kg (213 lb 12.8 oz)  05/26/16 102.1 kg (225 lb)    Physical Exam:  Gen: NAD. Vital signs reviewed. HENT: Normocephalic and atraumatic.  Eyes: EOM are normal. No discharge.  Cardiovascular:RRR Respiratory: CTA B GI: ND,NT  Musculoskeletal: He exhibits no edema or tenderness.   Neurological:  He is alert and oriented.  B/l UE 5/5.  RLE: 0/5 HF to distal--stable  LLE: 2-/5 HF, 2/5 KE and 2/5 ADF/PF--remains inhibited by tone -emerging tone in LE's especially left side-- 2/4. DTR's 3+  Decreased sensation below level of injury, right leg more affected than left--stable Skin: Skin remains warm and dry. Intact.  Psych: pleasant and attentive  Assessment/Plan: 1. Functional and mobility deficits secondary to thoracic myelopathy which require 3+ hours per day of interdisciplinary therapy in a comprehensive inpatient rehab setting. Physiatrist is providing close team supervision and 24 hour management of active medical problems listed below. Physiatrist and rehab team continue to assess barriers to discharge/monitor patient progress toward functional and medical goals.  Function:  Bathing Bathing position   Position: Other (comment) (Sitting on BSC)  Bathing  parts Body parts bathed by patient: Right arm, Left arm, Chest, Abdomen, Front perineal area Body parts bathed by helper: Buttocks, Right upper leg, Left upper leg, Right lower leg, Left lower leg, Back  Bathing assist Assist Level: Touching or steadying assistance(Pt > 75%)   Set up : To obtain items  Upper Body Dressing/Undressing Upper body dressing   What is the patient wearing?: Pull over shirt/dress     Pull over shirt/dress - Perfomed by patient: Thread/unthread right sleeve, Thread/unthread left sleeve, Put head through opening, Pull shirt over trunk          Upper body assist Assist Level: Set up   Set up : To obtain clothing/put away  Lower Body Dressing/Undressing Lower body dressing   What is the patient wearing?: Pants Underwear - Performed by patient: Thread/unthread left underwear leg, Pull underwear up/down Underwear - Performed by helper: Thread/unthread right underwear leg, Thread/unthread left underwear leg, Pull underwear up/down Pants- Performed by patient: Thread/unthread left pants leg, Thread/unthread right pants leg Pants- Performed by helper: Pull pants up/down Non-skid slipper socks- Performed by patient: Don/doff right sock, Don/doff left sock Non-skid slipper socks- Performed by helper: Don/doff right sock, Don/doff left sock     Shoes - Performed by patient: Don/doff right shoe, Don/doff left shoe, Fasten right Shoes - Performed by helper: Fasten left (assist with placement of LLE and stabilize leg on opposite knee while pt tied shoe) AFO - Performed by patient: Don/doff right AFO     TED Hose - Performed by helper: Don/doff right TED  hose, Don/doff left TED hose  Lower body assist Assist for lower body dressing: Touching or steadying assistance (Pt > 75%) (leg lifter)      Toileting Toileting Toileting activity did not occur: No continent bowel/bladder event Toileting steps completed by patient: Performs perineal hygiene Toileting steps  completed by helper: Adjust clothing prior to toileting, Adjust clothing after toileting Toileting Assistive Devices: Grab bar or rail  Toileting assist Assist level: Touching or steadying assistance (Pt.75%)   Transfers Chair/bed transfer Chair/bed transfer activity did not occur: N/A Chair/bed transfer method: Lateral scoot Chair/bed transfer assist level: Touching or steadying assistance (Pt > 75%) Chair/bed transfer assistive device: Armrests, Sliding board     Locomotion Ambulation     Max distance: 5 Assist level: 2 helpers   Wheelchair   Type: Manual Max wheelchair distance: 200 Assist Level: Supervision or verbal cues  Cognition Comprehension Comprehension assist level: Follows complex conversation/direction with no assist  Expression Expression assist level: Expresses complex ideas: With no assist  Social Interaction Social Interaction assist level: Interacts appropriately with others - No medications needed.  Problem Solving Problem solving assist level: Solves complex problems: Recognizes & self-corrects  Memory Memory assist level: Complete Independence: No helper   Medical Problem List and Plan: 1.  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   -MRI 2/2 reviewed showing increased in signal intensity throughout thoracic spine    -continued LE spasms  -completion of angio revealed no vascular abnormalities.  -Neurology continues to follow. ?re-introduce steroids.  2.  DVT Prophylaxis/Anticoagulation: SCDs. Venous doppler study negative  -resume lovenox 3. Pain Management: Hydrocodone as needed  -baclofen for spasms--increase to 15mg  qid   -add  4. Hypertension. Norvasc 10 mg daily Monitor when out of bed .  Controlled 2/11 5. Neuropsych: This patient is capable of making decisions on his own behalf.  -provide ego support as needed  -pt with   anxiety related to his clinical condition 6. Skin/Wound Care: Routine skin  checks 7. Fluids/Electrolytes/Nutrition:encourage PO 8.CRI stage III. Follow-up chemistries next week after angio 9. Neurogenic bowel bladder.  -no spontaneous voiding other than some incontinence   -dced urecholine   -I/o cath prn  -check ua/ucx---uti could be increasing spasms  -bowel prep per INR       Ranelle OysterZachary T. Swartz, MD, [facility]          LOS (Days) 27 A FACE TO FACE EVALUATION WAS PERFORMED  Ranelle OysterSWARTZ,ZACHARY T, MD 06/28/2016 8:36 AM

## 2016-06-28 NOTE — Progress Notes (Signed)
Occupational Therapy Session Note  Patient Details  Name: Perry Morrison MRN: [id number] Date of Birth: 07/07/1960  Today's Date: 06/28/2016 OT Individual Time: 1300-1415 OT Individual Time Calculation (min): 75 min   Short Term Goals: Week 3:  OT Short Term Goal 1 (Week 3): STG=LTG due to LOS  Skilled Therapeutic Interventions/Progress Updates: ADL-retraining at shower level with focus on tub/shower transfers, re-ed on use of AE (reacher, LH sponge, leg lifter, sock aid), pain management and activity tolerance.   Pt received seated in w/c with family/friends present.   Pt receptive for planned BADL however required brief re-ed on use of tub/shower versus walk-in shower.   With extra to complete setup, pt gathered his clothing and supplies and was escorted to tub room.  With min instructional cues and steadying assist, pt completed lateral scoot toward his right side using leg lifter and min assist to lift right leg into tub.   Pt completed remainder of transfer unassisted and required brief instruction on use of hand shower.   Pt doffed clothing seated on bench  Using reacher as instructed to remove his socks and pants.  Pt was provided LH sponge to wash his feet but was able to reach his feet without need of device during bathing.  Pt enjoyed prolonged shower, applied lotion after bathing and dressed upper body with only min assist to wash his back and setup to provide clothing.   Pt returned to w/c with only steadying assist during transfer but required mod assist to don pants and non-skid socks due to fatigue.   Pt was escorted back to his room to rest seated in his w/c prior to grooming at sink.     Therapy Documentation Precautions:  Precautions Precautions: Fall Precaution Comments: RLE hemiparesis Other Brace/Splint: PRAFO R foot Restrictions Weight Bearing Restrictions: No   Vital Signs: Therapy Vitals Pulse Rate: 75 Resp: 17 BP: 116/72   Pain: Pain Assessment Pain Score: 3   Pain Type: Neuropathic pain Pain Location: Generalized Pain Frequency: Intermittent Pain Onset: Unable to tell Pain Intervention(s): RN made aware    See Function Navigator for Current Functional Status.   Therapy/Group: Individual Therapy  [place] 06/28/2016, 4:04 PM

## 2016-06-28 NOTE — Plan of Care (Signed)
Problem: RH BOWEL ELIMINATION Goal: RH STG MANAGE BOWEL WITH ASSISTANCE STG Manage Bowel with min Assistance.   Outcome: Not Progressing Requires max assist for suppository insertion  Problem: RH BLADDER ELIMINATION Goal: RH STG MANAGE BLADDER WITH ASSISTANCE STG Manage Bladder With min  Assistance   Outcome: Not Progressing I/O cath

## 2016-06-28 NOTE — Progress Notes (Signed)
Physical Therapy Weekly Progress Note  Patient Details  Name: Perry Morrison MRN: [id number] Date of Birth: 1961/01/22  Beginning of progress report period: June 19, 2016 End of progress report period: June 28, 2016  Today's Date: 06/28/2016 PT Individual Time: 1105-1205 PT Individual Time Calculation (min): 60 min   Patient has met 4 of 6 long term goals.  Currently requires min guard>S for transfers, bed mobility with AE and bed controls, and modI w/c propulsion. Sit <>stand assist varies from min guard in stedy, to mod/maxA with RW. Gait no longer a focus of therapy session, and gait goals have been d/c'ed due to continued RLE hemiplegia and decreased core strength. Spasms in BLE have increased and occasionally interfere with mobility tasks such as transitional movements which trigger spasms. Pt continues to work hard during therapy sessions but is limited due to pain, endurance, spasms, emotional status related to uncertainty of medical status and prognosis.   Patient continues to demonstrate the following deficits muscle weakness and muscle paralysis, decreased cardiorespiratoy endurance, abnormal tone, unbalanced muscle activation, decreased coordination and decreased motor planning and decreased sitting balance, decreased standing balance, decreased postural control and decreased balance strategies and therefore will continue to benefit from skilled PT intervention to increase functional independence with mobility.  Patient progressing toward long term goals..  Continue plan of care.  PT Short Term Goals Week 3:  PT Short Term Goal 1 (Week 3): =LTG downgraded to S w/c level  Week 4:   =LTG at modI/S w/c level  Skilled Therapeutic Interventions/Progress Updates: Pt received supine in bed, c/o pain as below and agreeable to treatment. Supine>sit with bedrails, leg lifter and HOB elevated. Sit >stand in stedy with min guard from elevated bed. Standing tolerance x1-2 min x3 trials  total; reports light headedness by final trial, also reports he does notice he is holding his breath d/t effort with task. Transferred to w/c totalA in stedy. W/c propulsion modI to gym. Transfer w/c <>mat table with S, transfer board and arm rests. Sit <>stand modA from elevated mat table with RW x3 trials; second person present for safety but did not assist. Mirror visual feedback utilized for postural awareness. Sit >supine maxA +2 for time/energy conservation. Rolling supine>prone minA and cues for technique and use of UEs for momentum. Prone>quadruped>tall kneeling on bench with maxA; unable to maintain tall kneeling without maxA, cues for abdominal draw-in, and upright posture. Significant core strength deficits noted in tall kneeling, spasms in BLE when transitioning prone<>quadruped. Remained prone several minutes per pt request due to comfort, hip flexor stretching. Returned to w/c as above; modI w/c propulsion to room at end of session.      Therapy Documentation Precautions:  Precautions Precautions: Fall Precaution Comments: RLE hemiparesis Other Brace/Splint: PRAFO R foot Restrictions Weight Bearing Restrictions: No Pain: Pain Assessment Pain Assessment: No/denies pain (just continued spasms)  See Function Navigator for Current Functional Status.  Therapy/Group: Individual Therapy  Luberta Mutter 06/28/2016, 11:56 AM

## 2016-06-28 NOTE — Progress Notes (Signed)
Physical Therapy Session Note  Patient Details  Name: Perry Morrison MRN: [id number] Date of Birth: 04/21/1961  Today's Date: 06/28/2016 PT Individual Time: 0903-1004 PT Individual Time Calculation (min): 61 min   Short Term Goals: Week 3:  PT Short Term Goal 1 (Week 3): =LTG downgraded to S w/c level   Skilled Therapeutic Interventions/Progress Updates:    Pt received in w/c & agreeable to tx, denying c/o pain. Transferred pt w/c<>tilt table via maximove total assist; activity focused on weight bearing through BLE and upright tolerance. Vitals as follows:  Supine: BP = 127/77 mmHg, HR = 88 bpm 80 degrees: BP = 106/61 mmHg, HR = 124 bpm with pt c/o lightheadedness & feeling hot Pt returned to 40 degrees: BP = 105/81 mmHg, HR = 109 bpm with c/o "slight" lightheadedness 40 degrees after ~5 minutes: BP = 121/78 mmHg, HR = 96 bpm 50 degrees: BP = 120/ 79 mmHg, HR = 106 bpm 60 degrees: BP = 94/62 mmHg, HR = 116 bpm with pt c/o "hot flashes" Pt returned to 40 degrees & at 0 minutes: BP = 110/76 mmHg, HR = 114 bpm 40 degrees after ~3 minutes: BP = 106/72 mmHg, HR = 104 bpm with pt c/o not feeling well Pt returned to 30 degrees: BP = 113/81 mmHg, HR = 96 bpm Supine: BP = 124/85 mmHg, HR = 80 bpm Sitting in w/c at end of session: BP = 99/85 mmHg, HR = 96 bpm (RN made aware of pt's BP at end of session)  At end of session pt left in w/c with rehab tech escorting him back to his room.  Therapy Documentation Precautions:  Precautions Precautions: Fall Precaution Comments: RLE hemiparesis Other Brace/Splint: PRAFO R foot Restrictions Weight Bearing Restrictions: No   See Function Navigator for Current Functional Status.   Therapy/Group: Individual Therapy  [name] 06/28/2016, 10:20 AM

## 2016-06-29 ENCOUNTER — Inpatient Hospital Stay (HOSPITAL_COMMUNITY): Payer: Medicaid Other | Admitting: Occupational Therapy

## 2016-06-29 ENCOUNTER — Encounter (HOSPITAL_COMMUNITY): Payer: Self-pay | Admitting: Interventional Radiology

## 2016-06-29 ENCOUNTER — Inpatient Hospital Stay (HOSPITAL_COMMUNITY): Payer: Self-pay | Admitting: Physical Therapy

## 2016-06-29 ENCOUNTER — Inpatient Hospital Stay (HOSPITAL_COMMUNITY): Payer: Medicaid Other

## 2016-06-29 MED ORDER — AMOXICILLIN 500 MG PO CAPS
500.0000 mg | ORAL_CAPSULE | Freq: Three times a day (TID) | ORAL | Status: DC
  Administered 2016-06-29 – 2016-06-30 (×3): 500 mg via ORAL
  Filled 2016-06-29 (×3): qty 1

## 2016-06-29 MED ORDER — CLONAZEPAM 0.5 MG PO TABS
0.2500 mg | ORAL_TABLET | Freq: Two times a day (BID) | ORAL | Status: DC
  Administered 2016-06-29 – 2016-06-30 (×3): 0.25 mg via ORAL
  Filled 2016-06-29 (×3): qty 1

## 2016-06-29 NOTE — Progress Notes (Signed)
Occupational Therapy Weekly Progress Note  Patient Details  Name: Perry Morrison MRN: [id number] Date of Birth: 05-Jan-1961  Beginning of progress report period: June 18, 2016 End of progress report period: June 29, 2016  Today's Date: 06/29/2016 OT Individual Time: 1572-6203 OT Individual Time Calculation (min): 45 min  and Today's Date: 06/29/2016 OT Missed Time: 15 Minutes Missed Time Reason: Nursing care   STGs not established last week as it was anticipated pt would discharge during the week. Pt is continuing to progress with his ability to utilize adaptive techniques and overall trunk control to assist with mobility skills.   Patient continues to demonstrate the following deficits: muscle weakness and muscle paralysis, abnormal tone and decreased standing balance (due to severe leg weakness/ paralysis) and therefore will continue to benefit from skilled OT intervention to enhance overall performance with BADL.  Patient progressing toward long term goals..  Continue plan of care.   OT Short Term Goals Week 1:  OT Short Term Goal 1 (Week 1): Pt will complete LB dressing sit <> stand with min A using AE PRN OT Short Term Goal 1 - Progress (Week 1): Met OT Short Term Goal 2 (Week 1): Pt will transfer w/c <> toilet with min A OT Short Term Goal 2 - Progress (Week 1): Met OT Short Term Goal 3 (Week 1): Pt will complete 1 grooming task in standing to increase standing tolerance for ADLs OT Short Term Goal 3 - Progress (Week 1): Not met OT Short Term Goal 4 (Week 1): Pt will manage w/c parts independently during functional tasks/ transfers OT Short Term Goal 4 - Progress (Week 1): Met Week 2:  OT Short Term Goal 1 (Week 2): Pt will complete one grooming task in standing to increase functional standing balance/ tolerance OT Short Term Goal 1 - Progress (Week 2): Not met OT Short Term Goal 2 (Week 2): Pt will complete 3/3 toileting tasks with min A OT Short Term Goal 2 - Progress  (Week 2): Partly met OT Short Term Goal 3 (Week 2): Pt will transfer to toilet with close supervision to decrease caregiver burden OT Short Term Goal 3 - Progress (Week 2): Met OT Short Term Goal 4 (Week 2): Will initiate shower transfer goal utilizing tub/shower combination with tub transfer bench in prep for d/c OT Short Term Goal 4 - Progress (Week 2): Progressing toward goal Week 3:  OT Short Term Goal 1 (Week 3): STG=LTG due to LOS Week 4:  OT Short Term Goal 1 (Week 4): STGs = LTGs  Skilled Therapeutic Interventions/Progress Updates:    At start of session, pt working with nursing on cathing routine. He opted to not bathe LB as he showered yesterday.  LB dressing bed level with focus on semi circle sitting to don pants over feet. Attempted a supine to long sit with arms to push up but too challenging so instead used elevated HOB. With HOB elevated, pt worked on donning pants over feet with cues for which angles to pull on fabric to don more easily. With bed flat, rolling with min A and pt was then able to pull pants over hips. Cues to actively use flexed L knee to assist with rolling to the R. From sidelying, A to guide legs off edge of bed with pt using L foot hooked under R.  Pt then pushed up to sit without A.  Slide board to w/c with set up/S.  Pt completed UB self care I'tly from wc level. Pt  in room with all needs met.  Therapy Documentation Precautions:  Precautions Precautions: Fall Precaution Comments: RLE hemiparesis Other Brace/Splint: PRAFO R foot Restrictions Weight Bearing Restrictions: No   Pain: Pain Assessment Pain Assessment: No/denies pain ADL:   See Function Navigator for Current Functional Status.   Therapy/Group: Individual Therapy  Marili Vader 06/29/2016, 12:28 PM

## 2016-06-29 NOTE — Progress Notes (Signed)
Physical Therapy Session Note  Patient Details  Name: Aurea GraffJamie Kaiser MRN: [id number] Date of Birth: 01/27/1961  Today's Date: 06/29/2016 PT Individual Time: 1430-1500 PT Individual Time Calculation (min): 30 min    Skilled Therapeutic Interventions/Progress Updates:   Session focused on bed mobility using hospital bed features and leg lifter to aid with RLE management, scoot pivot transfer (supervision with extra time) from bed -> w/c with cues for efficiency, and neuro re-ed for dynamic sitting balance/trunk control seated in w/c (with legrests off for proprioceptive input for BLE and back unsupported and armrests removed for increasing challenge of balance) while throwing/catching a football with a visitor. One episode of LOB occurred requiring min assist to recover, otherwise supervision level.  Therapy Documentation Precautions:  Precautions Precautions: Fall Precaution Comments: RLE hemiparesis Other Brace/Splint: PRAFO R foot Restrictions Weight Bearing Restrictions: No  Pain:  Denies pain.    See Function Navigator for Current Functional Status.   Therapy/Group: Individual Therapy  Karolee StampsGray, Gillermo Poch Darrol PokeBrescia  Annalyssa Thune B. Altagracia Rone, PT, DPT  06/29/2016, 3:38 PM

## 2016-06-29 NOTE — Progress Notes (Signed)
Physical Therapy Session Note  Patient Details  Name: Perry Morrison MRN: [id number] Date of Birth: 12/26/1960  Today's Date: 06/29/2016 PT Individual Time: 1000-1100 PT Individual Time Calculation (min): 60 min   Short Term Goals: Week 4:  PT Short Term Goal 1 (Week 4): =LTG due to estimated LOS  Skilled Therapeutic Interventions/Progress Updates: Pt received seated in w/c, denies pain and agreeable to treatment. W/c propulsion modI to/from gym including opening doors. Transfer w/c <>mat table S with transfer board and arm rests. Short and long sitting LE stretches with min cues for technique and setup; hamstrings, hip ER, gastroc/soleus. Transitions supine<>long sit with mod/maxA d/t core strength deficits and difficulty with rolling, propped elbow sitting. Sit up trial with wedge and maxA; poor use of momentum to A however palpable contraction of upper rectus abdominis. Transfer w/c <>nustep with transfer board and S. Performed nustep BLE and assist from RUE d/t RLE weakness; 5 min total however slow speed and several rest breaks required. Returned to room as above modI w/c propulsion at end of session.      Therapy Documentation Precautions:  Precautions Precautions: Fall Precaution Comments: RLE hemiparesis Other Brace/Splint: PRAFO R foot Restrictions Weight Bearing Restrictions: No   See Function Navigator for Current Functional Status.   Therapy/Group: Individual Therapy  [name] 06/29/2016, 3:38 PM

## 2016-06-29 NOTE — Progress Notes (Signed)
Occupational Therapy Session Note  Patient Details  Name: Perry GraffJamie Mcclenny MRN: [id number] Date of Birth: 11/16/1960  Today's Date: 06/29/2016 OT Individual Time: 1300-1316 OT Individual Time Calculation (min): 16 min  and Today's Date: 06/29/2016 OT Missed Time: 14 Minutes Missed Time Reason: Patient fatigue   Short Term Goals: Week 4:  OT Short Term Goal 1 (Week 4): STGs = LTGs  Skilled Therapeutic Interventions/Progress Updates:    Upon entering the room, pt sleeping and reports, "They are trying me on a new medicine and it is knocking me out." Pt does report that he will attempt therapies this session but declines exiting the bed. Skilled OT intervention with focus on core strengthening , long sitting, and sitting up from flat bed. Pt initially unable to set up from bed at 30 degrees. Pt starting with HOB elevated at 35 degrees for supine <> long sitting. Degree decreased 1 % with each rep until 25% reached and then declined 5%  After each reps. Pt able to perform supine >long sitting from flat bed by end of session by digging in with elbows first and then pushing through hands to come into full sitting. Once in long sitting pt placing hands in lap and reaching forwards for legs with reach rep. Pt returning to supine at end of session secondary to fatigue. Call bell and all needed items within reach upon exiting the room.   Therapy Documentation Precautions:  Precautions Precautions: Fall Precaution Comments: RLE hemiparesis Other Brace/Splint: PRAFO R foot Restrictions Weight Bearing Restrictions: No General: General OT Amount of Missed Time: 14 Minutes  See Function Navigator for Current Functional Status.   Therapy/Group: Individual Therapy  Alen BleacherBradsher, [place]onlea Sima P 06/29/2016, 2:18 PM

## 2016-06-29 NOTE — Progress Notes (Signed)
Perry Morrison     PROGRESS NOTE    Subjective/Complaints:  No change in spasms. Awaken him from sleep and affect transfers  ROS: pt denies nausea, vomiting, diarrhea, cough, shortness of breath or chest pain    Objective: Vital Signs: Blood pressure (!) 106/45, pulse 76, temperature 98.3 F (36.8 C), temperature source Oral, resp. rate 18, height 6\' 1"  (1.854 m), weight 97 kg (213 lb 12.8 oz), SpO2 98 %. No results found. No results for input(s): WBC, HGB, HCT, PLT in the last 72 hours. No results for input(s): NA, K, CL, GLUCOSE, BUN, CREATININE, CALCIUM in the last 72 hours.  Invalid input(s): CO CBG (last 3)  No results for input(s): GLUCAP in the last 72 hours.  Wt Readings from Last 3 Encounters:  06/24/16 97 kg (213 lb 12.8 oz)  05/26/16 102.1 kg (225 lb)    Physical Exam:  Gen: NAD. Marland Kitchen HENT: Normocephalic and atraumatic.  Eyes: EOM are normal. No discharge.  Cardiovascular:RRR Respiratory: CTA B GI: ND,NT  Musculoskeletal: He exhibits no edema or tenderness.   Neurological:  He is alert and oriented.  B/l UE 5/5.  RLE: 0/5 HF to distal--stable  LLE: 2-/5 HF, 2/5 KE and 2/5 ADF/PF--remains inhibited by tone -emerging tone in LE's especially left side-- 3/4. DTR's 3+  Decreased sensation below level of injury, right leg more affected than left--stable Skin: Skin remains warm and dry. Intact.  Psych: remains pleasant  Assessment/Plan: 1. Functional and mobility deficits secondary to thoracic myelopathy which require 3+ hours per day of interdisciplinary therapy in a comprehensive inpatient rehab setting. Physiatrist is providing close team supervision and 24 hour management of active medical problems listed below. Physiatrist and rehab team continue to assess barriers to discharge/monitor patient progress toward functional and medical goals.  Function:  Bathing Bathing position   Position: Other (comment) (sitting on tub  bench, tub/shower)  Bathing parts Body parts bathed by patient: Right arm, Left arm, Chest, Front perineal area, Abdomen, Buttocks, Right upper leg, Left upper leg, Right lower leg, Left lower leg Body parts bathed by helper: Back  Bathing assist Assist Level: Touching or steadying assistance(Pt > 75%)   Set up : To obtain items, To adjust water temperature  Upper Body Dressing/Undressing Upper body dressing   What is the patient wearing?: Pull over shirt/dress     Pull over shirt/dress - Perfomed by patient: Thread/unthread right sleeve, Thread/unthread left sleeve, Put head through opening, Pull shirt over trunk          Upper body assist Assist Level: Set up   Set up : To obtain clothing/put away  Lower Body Dressing/Undressing Lower body dressing   What is the patient wearing?: Pants, Non-skid slipper socks Underwear - Performed by patient: Thread/unthread left underwear leg, Pull underwear up/down Underwear - Performed by helper: Thread/unthread right underwear leg, Thread/unthread left underwear leg, Pull underwear up/down Pants- Performed by patient: Thread/unthread right pants leg, Thread/unthread left pants leg Pants- Performed by helper: Pull pants up/down Non-skid slipper socks- Performed by patient: Don/doff right sock, Don/doff left sock Non-skid slipper socks- Performed by helper: Don/doff right sock, Don/doff left sock     Shoes - Performed by patient: Don/doff right shoe, Don/doff left shoe, Fasten right Shoes - Performed by helper: Fasten left (assist with placement of LLE and stabilize leg on opposite knee while pt tied shoe) AFO - Performed by patient: Don/doff right AFO     TED Hose - Performed by helper: Don/doff  right TED hose, Don/doff left TED hose  Lower body assist Assist for lower body dressing:  (mod assist)      Toileting Toileting Toileting activity did not occur: No continent bowel/bladder event Toileting steps completed by patient: Performs  perineal hygiene Toileting steps completed by helper: Adjust clothing prior to toileting, Adjust clothing after toileting Toileting Assistive Devices: Grab bar or rail  Toileting assist Assist level: Touching or steadying assistance (Pt.75%)   Transfers Chair/bed transfer Chair/bed transfer activity did not occur: N/A Chair/bed transfer method: Lateral scoot Chair/bed transfer assist level: Supervision or verbal cues Chair/bed transfer assistive device: Armrests, Sliding board     Locomotion Ambulation     Max distance: 5 Assist level: 2 helpers   Wheelchair   Type: Manual Max wheelchair distance: 150 ft Assist Level: No help, No cues, assistive device, takes more than reasonable amount of time  Cognition Comprehension Comprehension assist level: Follows complex conversation/direction with no assist  Expression Expression assist level: Expresses complex ideas: With no assist  Social Interaction Social Interaction assist level: Interacts appropriately with others - No medications needed.  Problem Solving Problem solving assist level: Solves complex 90% of the time/cues < 10% of the time  Memory Memory assist level: Complete Independence: No helper   Medical Problem List and Plan: 1.  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   -MRI 2/2 reviewed showing increased in signal intensity throughout thoracic spine    -continued LE spasms  -completion of angio revealed no vascular abnormalities.  -Neurology continues to follow. ?re-introduce steroids.  2.  DVT Prophylaxis/Anticoagulation: SCDs. Venous doppler study negative  -resume lovenox 3. Pain Management: Hydrocodone as needed  -baclofen for spasms--increased to 15mg  qid  -rx UTI as above  -introduce low dose klonopin 4. Hypertension. Norvasc 10 mg daily Monitor when out of bed .  Controlled 2/11 5. Neuropsych: This patient is capable of making decisions on his own  behalf.  -provide ego support as needed  -pt with   anxiety related to his clinical condition 6. Skin/Wound Care: Routine skin checks 7. Fluids/Electrolytes/Nutrition:encourage PO 8.CRI stage III. Follow-up chemistries next week after angio 9. Neurogenic bowel bladder.  -no spontaneous voiding other than some incontinence   -dced urecholine   -I/o cath prn  -ua +, ucs pending. Begin empiric amoxil 500mg  q8  -bowel prep per INR       Perry Morrison. Swartz, MD, [facility]FAAPMR [facility]          LOS (Days) 28 A FACE TO FACE EVALUATION WAS PERFORMED  Perry OysterSWARTZ,Perry T, MD 06/29/2016 8:41 AM

## 2016-06-30 ENCOUNTER — Inpatient Hospital Stay (HOSPITAL_COMMUNITY): Payer: Self-pay | Admitting: Physical Therapy

## 2016-06-30 ENCOUNTER — Inpatient Hospital Stay (HOSPITAL_COMMUNITY): Payer: Medicaid Other | Admitting: Occupational Therapy

## 2016-06-30 ENCOUNTER — Inpatient Hospital Stay (HOSPITAL_COMMUNITY): Payer: Self-pay | Admitting: Occupational Therapy

## 2016-06-30 ENCOUNTER — Encounter (HOSPITAL_COMMUNITY): Payer: Self-pay | Admitting: Interventional Radiology

## 2016-06-30 DIAGNOSIS — R339 Retention of urine, unspecified: Secondary | ICD-10-CM

## 2016-06-30 DIAGNOSIS — R200 Anesthesia of skin: Secondary | ICD-10-CM

## 2016-06-30 DIAGNOSIS — G373 Acute transverse myelitis in demyelinating disease of central nervous system: Secondary | ICD-10-CM

## 2016-06-30 DIAGNOSIS — N390 Urinary tract infection, site not specified: Secondary | ICD-10-CM

## 2016-06-30 DIAGNOSIS — A499 Bacterial infection, unspecified: Secondary | ICD-10-CM

## 2016-06-30 LAB — URINE CULTURE: Culture: >=100000 — AB

## 2016-06-30 MED ORDER — BACLOFEN 10 MG PO TABS
10.0000 mg | ORAL_TABLET | Freq: Four times a day (QID) | ORAL | Status: DC
  Administered 2016-06-30 – 2016-07-10 (×41): 10 mg via ORAL
  Filled 2016-06-30 (×41): qty 1

## 2016-06-30 MED ORDER — CLONAZEPAM 0.5 MG PO TABS
0.5000 mg | ORAL_TABLET | Freq: Three times a day (TID) | ORAL | Status: DC
  Administered 2016-06-30 – 2016-07-10 (×31): 0.5 mg via ORAL
  Filled 2016-06-30 (×30): qty 1

## 2016-06-30 MED ORDER — CEPHALEXIN 250 MG PO CAPS
250.0000 mg | ORAL_CAPSULE | Freq: Four times a day (QID) | ORAL | Status: AC
  Administered 2016-06-30 – 2016-07-06 (×28): 250 mg via ORAL
  Filled 2016-06-30 (×28): qty 1

## 2016-06-30 NOTE — Progress Notes (Signed)
Recreational Therapy Session Note  Patient Details  Name: Perry Morrison MRN: [id number] Date of Birth: 06/09/1960 Today's Date: 06/30/2016  Pain: no c/o Skilled Therapeutic Interventions/Progress Updates: Session focused on discussing community reintegration including purpose, potential goals and scheduling.  Pt agreeable to participate in an outing to be scheduled tomorrow. Lakelyn Straus 06/30/2016, 1:32 PM

## 2016-06-30 NOTE — Progress Notes (Signed)
Neurology Progress Note  Dr. 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 I have met with the patient and have reviewed his chart at length. Pertinent details are summarized below.   This is a 56 year old right-handed man who presented to the [facility] emergency department on 05/26/16 after he developed weakness in the right leg. According to notes, he came to the ED on 05/24/16 complaining of abdominal pain. At the time he was having some pain in the right upper quadrant of his abdomen extending around the right flank that had steadily been worsening over a couple of days. He also reported constipation. On evaluation it was felt that he may have muscle strain as there was no rash that would suggest zoster. Abdominal x-ray showed a large burden of stool in his colon. He was sent home with a diagnosis of constipation with instructions to try magnesium citrate and follow-up with his PCP.  After this ED visit, he began to notice some weakness in his right leg. This continued to worsen and he returned to the emergency department on 05/26/16. He complained that he was dragging his right foot and he had no strength in his right leg. He also reported that he had the urge to urinate but was unable to void more than a few drops. Examination at this time revealed 1/5 strength in the right lower extremity. Bladder scan showed residual between 800 and 1000 mL. MRI scan of the lumbar and thoracic spine were performed and showed evidence of longitudinal myelitis. Lumbar puncture was performed in the emergency department and neurology consultation was requested. His CSF was notable for normal white blood cell count with elevated protein and red blood cells. He was seen by Dr. Doren Custard, neurology, on 05/27/16. Dr. Lawana Pai documented "profound weakness of right leg from hip down to toes, intact in other limbs" with absent  reflexes in the right leg. He noted intact sensation in both legs with a sensory band in the lower thorax that he T9-10. His initial impression was that this represented spinal cord infarction versus a vascular malformation. He excluded transverse myelitis given absence of white blood cells on CSF. MRI scan of the brain was obtained and showed no significant abnormality. MRI of C-spine also showed no significant evidence of pathology. MRA of the spine was obtained but was unrevealing. He was given an empiric trial of IV Solu-Medrol with little significant change in symptoms. After discussing the findings on imaging with the radiologist, Dr. Lawana Pai felt that it was more consistent with transverse myelitis with hemorrhage. He was continued on IV Solu-Medrol for a total of 5 days.  On 05/30/16, he noted some numbness and tingling in his left leg. On examination, he continued to have normal strength in the left lower extremity but now had reduced pinprick over the left trunk and leg below the level of T10. There remained some debate about his diagnosis, with the new neurologist (Dr. Patrcia Dolly) concerned that the patient had a ruptured AVM or dural AV fistula in the thoracic spine. Conventional angiography of the spine was recommended. On 06/01/16, typical serologies to work up transverse mellitus were ordered including ESR, CRP, ACE, NMO antibody, ANCA panel, SS-A and SS-B antibodies, and ANA; all of these were unremarkable.  He completed his IV steroids and on 06/01/16 was admitted to [facility]. The patient reports that overall, his lower extremity symptoms have been unchanged throughout the course  of his rehabilitation stay. He has worked well with physical and occupational therapy but has become frustrated with his lack of progress. Neurology was re-consulted on 06/12/16 and the patient was seen by Dr. Leonel Ramsay. On his examination, he documented 4/5 strength in the left lower extremity, 0/5 strength  in the right lower extremity with intermittent spastic jerking of both legs. He reported decreased light touch and temperature in the lower extremities with absent reflexes in the lower extremities. There remains some concern that he may have a vascular etiology for his symptoms. MRI of the thoracic cord was repeated and demonstrated some mild extension of the signal abnormality from T3 up to T2 and from T10 down to T11. Dr. Leonel Ramsay spoke with interventional radiology and arranged spinal angiogram by Dr. Estanislado Pandy. He had a spinal angiogram performed on 06/16/16 which was a limited study due to the fact that he had significant overlying gas and fecal material in the intestines that precluded visualization of portions of the vasculature. The angiogram was repeated on 06/26/16 and showed no obvious vascular abnormality such as AVM or dural AV fistula.  At this point, the patient is very frustrated and wants to know if he is going to improve. He wants to know if he can be transferred closer to his home so that he can be near family as he recovers. Today, the patient reports that he did not receive any vaccinations prior to the onset of symptoms. He states he had had several episodes that he thought were shingles, describing pain in the abdomen and flanks. He was evaluated by his physician but says that he never had any vesicles or rash so a definitive diagnosis of shingles was never made. He states that these episodes occurred over 2 or 3 years preceding the onset of his myelitis. The most recent occurred a couple of days before presentation as noted above.  Current Meds:   Current Facility-Administered Medications:  .  acetaminophen (TYLENOL) tablet 650 mg, 650 mg, Oral, Q6H PRN, 650 mg at 06/21/16 0915 **OR** acetaminophen (TYLENOL) suppository 650 mg, 650 mg, Rectal, Q6H PRN, Lavon Paganini Angiulli, PA-C .  ALPRAZolam Duanne Moron) tablet 0.5 mg, 0.5 mg, Oral, TID PRN, Meredith Staggers, MD, 0.5 mg at 06/27/16 0012 .   amLODipine (NORVASC) tablet 10 mg, 10 mg, Oral, Daily, Lavon Paganini Angiulli, PA-C, 10 mg at 06/30/16 0805 .  baclofen (LIORESAL) tablet 10 mg, 10 mg, Oral, QID, Meredith Staggers, MD, 10 mg at 06/30/16 1315 .  bisacodyl (DULCOLAX) suppository 10 mg, 10 mg, Rectal, Q0600, Meredith Staggers, MD, 10 mg at 06/30/16 0537 .  cephALEXin (KEFLEX) capsule 250 mg, 250 mg, Oral, QID, Meredith Staggers, MD, 250 mg at 06/30/16 1315 .  clonazePAM (KLONOPIN) tablet 0.5 mg, 0.5 mg, Oral, TID, Meredith Staggers, MD, 0.5 mg at 06/30/16 1315 .  enoxaparin (LOVENOX) injection 30 mg, 30 mg, Subcutaneous, Q12H, Meredith Staggers, MD, 30 mg at 06/30/16 [phone] .  HYDROcodone-acetaminophen (NORCO/VICODIN) 5-325 MG per tablet 1-2 tablet, 1-2 tablet, Oral, Q4H PRN, Lavon Paganini Angiulli, PA-C, 2 tablet at 06/23/16 0139 .  hydrocortisone (ANUSOL-HC) suppository 25 mg, 25 mg, Rectal, Daily, Lavon Paganini Angiulli, PA-C, 25 mg at 06/30/16 0754 .  lactated ringers infusion, , Intravenous, Continuous, Luanne Bras, MD, Last Rate: 50 mL/hr at 06/26/16 1226 .  lactulose (CHRONULAC) 10 GM/15ML solution 30 g, 30 g, Oral, BID PRN, Lavon Paganini Angiulli, PA-C, 30 g at 06/06/16 1738 .  methocarbamol (ROBAXIN) tablet 500 mg, 500 mg,  Oral, Q6H PRN, Meredith Staggers, MD, 500 mg at 06/28/16 1815 .  ondansetron (ZOFRAN) tablet 4 mg, 4 mg, Oral, Q6H PRN **OR** ondansetron (ZOFRAN) injection 4 mg, 4 mg, Intravenous, Q6H PRN, Lavon Paganini Angiulli, PA-C .  pantoprazole (PROTONIX) EC tablet 40 mg, 40 mg, Oral, Daily, Lavon Paganini Angiulli, PA-C, 40 mg at 06/30/16 0806 .  senna-docusate (Senokot-S) tablet 2 tablet, 2 tablet, Oral, BID, Meredith Staggers, MD, 2 tablet at 06/30/16 0805 .  simethicone (MYLICON) chewable tablet 80 mg, 80 mg, Oral, Q6H PRN, Ankit Lorie Phenix, MD, 80 mg at 06/23/16 2020 .  sorbitol 70 % solution 30 mL, 30 mL, Oral, Daily PRN, Lavon Paganini Angiulli, PA-C, 30 mL at 06/25/16 2013 .  valACYclovir (VALTREX) tablet 500 mg, 500 mg, Oral, BID, Lavon Paganini  Angiulli, PA-C, 500 mg at 06/30/16 8469  Objective:  Temp:  [98.4 F (36.9 C)] 98.4 F (36.9 C) (02/20 1512) Pulse Rate:  [97] 97 (02/20 1512) Resp:  [18] 18 (02/20 1512) BP: (129)/(79) 129/79 (02/20 1512) SpO2:  [100 %] 100 % (02/20 1512)  General: WDWN man reclining in bed in NAD. Alert, oriented x4. Speech is clear without dysarthria. Affect is bright. Comportment is normal.   Neuro: MS: As noted above. No aphasia.  CN: Pupils are equal and reactive from 3-->2 mm bilaterally. EOMI, no nystagmus. Face is symmetric at rest with normal strength and mobility. Hearing is intact to conversational voice. Voice is normal in tone and quality.  Motor: Normal bulk, tone, and strength in the arms. He has no movement of the R foot, ankle, or lower leg with 2/5 strength in the R hip. L PF is 5/5, L DF is 4+/5. Occasional spasms are noted in both legs.  Sensation: Light touch is intact throughout. Pinprick is reduced in the LLE.  DTRs: 2+, symmetric in the arms, absent in the legs. Toes are mute bilaterally. No pathological reflexes.     Labs: Lab Results  Component Value Date   WBC 5.5 06/22/2016   HGB 12.7 (L) 06/22/2016   HCT 36.9 (L) 06/22/2016   PLT 345 06/22/2016   GLUCOSE 107 (H) 06/26/2016   ALT 170 (H) 06/16/2016   AST 60 (H) 06/16/2016   NA 136 06/26/2016   K 4.1 06/26/2016   CL 98 (L) 06/26/2016   CREATININE 1.00 06/26/2016   BUN 15 06/26/2016   CO2 28 06/26/2016   INR 1.05 06/16/2016   CBC Latest Ref Rng & Units 06/22/2016 06/16/2016 06/12/2016  WBC 4.0 - 10.5 K/uL 5.5 5.8 8.0  Hemoglobin 13.0 - 17.0 g/dL 12.7(L) 12.6(L) 14.5  Hematocrit 39.0 - 52.0 % 36.9(L) 37.0(L) 42.6  Platelets 150 - 400 K/uL 345 168 203    No results found for: HGBA1C Lab Results  Component Value Date   ALT 170 (H) 06/16/2016   AST 60 (H) 06/16/2016   ALKPHOS 91 06/16/2016   BILITOT 1.0 06/16/2016   SS-A <0.2 SS-B <0.2 ACE 27 ESR 5 ANA negative ANCA titers negative NMO antibodies  negative   Radiology:  I have personally and independently reviewed the MRI scan of the thoracic spine with and without contrast from 06/12/16. This shows an extensive area of increased signal within the spinal cord extending from T2 down to about T11. There is associated edema within the cord. On axial images, the signal involves mainly the central portions of the cord with relative sparing of the periphery. Areas of blood breakdown products are noted at T3-T5 and from T9-T11. These  are new when compared to her previous study from 05/26/16. Also noted her blood breakdown products from T6-T9 which are largely unchanged when compared to the prior study. On this study, the signal abnormality extends from about T2-T11, slightly and expanded compared to the previous study where extending from T3-T10. Subtle enhancement was noted at T3-4 and T10-11.  I have personally and independently reviewed the MRI scan of the cervical spine with and without contrast from 06/12/16. This shows diffuse degenerative changes that are most pronounced at C5-6 and C6-7 where there is severe neural foraminal stenosis. The spinal cord in the cervical region appears to be unremarkable.  I have personally and independently reviewed the MRI scan of the brain with and without contrast from 05/27/16. There are no significant white matter abnormalities. No abnormal enhancement is appreciated.  Spinal angiogram from 06/16/16 showed a dominant artery of Adamkiewicz arising from the left intercostal artery at T10. A nondominant similarly configured vascular structure arising from the left intercostal artery T11 likely represents excess reduplicated anatomic variation. The caudal extension of the artery of Adamkiewicz could not be visualized due to overlying gas and fecal material.  Spinal angiogram from 06/26/16 showed no evidence of abnormal arteriovenous shunting to suggest AV malformation or a dural AV fistula.   A/P:   1. 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 This is a rare entity that has been associated with infection (HIV, VZV, HSV-2), post-infectious demyelination (ADEM), flu vaccination, neoplasm, paraneoplastic antibodies (CRMP-5, voltage-gated calcium channels, voltage-gated potassium channels, amphiphysin, ganglionic ACh receptor, ANNA-1, and ANNA-2). Selective spinal angiography was completed on 06/26/16 and showed no evidence of dural AV fistula or other vascular anomaly that could explain his presentation. Treatment is unfortunately limited. He received steroids at the time of presentation but did not appreciate any significant benefit. Given clinical stability, there is likely little utility in repeating steroids or giving IVIg. If CSF serologies suggest HSV or VZV involvement, would initiate IV acyclovir.   Recommendations:  >Repeat lumbar puncture and send CSF for cell count, protein, glucose, oligoclonal bands, cytology, HSV PCR, VZV PCR, VZV IgG, and VZV IgM >Hold Lovenox in anticipation of LP in AM >Send serum for paraneoplastic antibodies, HIV, copper, zinc  2. Paraparesis: He has severe weakness of the right lower extremity with lesser weakness on the left due to his transverse myelitis. Symptoms appear to be stable for the most part. Continue PT/rehab.   3. Urinary retention: This is due to transverse myelitis. Continue rehab.   4. Left lower extremity sensory loss: This is due to his transverse myelitis. Continue PT/rehabilitation.  Melba Coon, MD [facility]

## 2016-06-30 NOTE — Progress Notes (Addendum)
[facility] PHYSICAL MEDICINE & REHABILITATION     PROGRESS NOTE    Subjective/Complaints:  Spasms seemed better last night. In better spirits today  ROS: pt denies nausea, vomiting, diarrhea, cough, shortness of breath or chest pain    Objective: Vital Signs: Blood pressure 130/80, pulse 82, temperature 98.3 F (36.8 C), temperature source Oral, resp. rate 20, height 6\' 1"  (1.854 m), weight 97 kg (213 lb 12.8 oz), SpO2 96 %. No results found. No results for input(s): WBC, HGB, HCT, PLT in the last 72 hours. No results for input(s): NA, K, CL, GLUCOSE, BUN, CREATININE, CALCIUM in the last 72 hours.  Invalid input(s): CO CBG (last 3)  No results for input(s): GLUCAP in the last 72 hours.  Wt Readings from Last 3 Encounters:  06/24/16 97 kg (213 lb 12.8 oz)  05/26/16 102.1 kg (225 lb)    Physical Exam:  Gen: NAD. Marland Kitchen HENT: Normocephalic and atraumatic.  Eyes: EOM are normal. No discharge.  Cardiovascular:RRR Respiratory: CTA B GI: ND,NT  Musculoskeletal: He exhibits no edema or tenderness.   Neurological:  He is alert and oriented.  B/l UE 5/5.  RLE: 0/5 HF to distal--stable  LLE: 1+/5 HF, 2- to 2/5 KE and 2/5 ADF/PF--  -improved extensor tone in LE's especially left side-- 1-2/4. DTR's 3+  Decreased sensation below level of injury, right leg more affected than left--stable Skin: Skin remains warm and dry. Intact.  Psych: remains pleasant  Assessment/Plan: 1. Functional and mobility deficits secondary to thoracic myelopathy which require 3+ hours per day of interdisciplinary therapy in a comprehensive inpatient rehab setting. Physiatrist is providing close team supervision and 24 hour management of active medical problems listed below. Physiatrist and rehab team continue to assess barriers to discharge/monitor patient progress toward functional and medical goals.  Function:  Bathing Bathing position   Position: Other (comment) (sitting on tub bench, tub/shower)   Bathing parts Body parts bathed by patient: Right arm, Left arm, Chest, Front perineal area, Abdomen, Buttocks, Right upper leg, Left upper leg, Right lower leg, Left lower leg Body parts bathed by helper: Back  Bathing assist Assist Level: Touching or steadying assistance(Pt > 75%)   Set up : To obtain items, To adjust water temperature  Upper Body Dressing/Undressing Upper body dressing   What is the patient wearing?: Pull over shirt/dress     Pull over shirt/dress - Perfomed by patient: Thread/unthread right sleeve, Thread/unthread left sleeve, Put head through opening, Pull shirt over trunk          Upper body assist Assist Level: Set up   Set up : To obtain clothing/put away  Lower Body Dressing/Undressing Lower body dressing   What is the patient wearing?: Pants Underwear - Performed by patient: Thread/unthread left underwear leg, Pull underwear up/down Underwear - Performed by helper: Thread/unthread right underwear leg, Thread/unthread left underwear leg, Pull underwear up/down Pants- Performed by patient: Thread/unthread right pants leg, Thread/unthread left pants leg, Pull pants up/down Pants- Performed by helper: Pull pants up/down Non-skid slipper socks- Performed by patient: Don/doff right sock, Don/doff left sock Non-skid slipper socks- Performed by helper: Don/doff right sock, Don/doff left sock     Shoes - Performed by patient: Don/doff right shoe, Don/doff left shoe, Fasten right Shoes - Performed by helper: Fasten left (assist with placement of LLE and stabilize leg on opposite knee while pt tied shoe) AFO - Performed by patient: Don/doff right AFO     TED Hose - Performed by helper: Don/doff right TED  hose, Don/doff left TED hose  Lower body assist Assist for lower body dressing: Touching or steadying assistance (Pt > 75%)      Toileting Toileting Toileting activity did not occur: No continent bowel/bladder event Toileting steps completed by patient:  Adjust clothing prior to toileting, Performs perineal hygiene Toileting steps completed by helper: Performs perineal hygiene, Adjust clothing after toileting Toileting Assistive Devices: Grab bar or rail  Toileting assist Assist level: Touching or steadying assistance (Pt.75%)   Transfers Chair/bed transfer Chair/bed transfer activity did not occur: N/A Chair/bed transfer method: Lateral scoot (Simultaneous filing. User may not have seen previous data.) Chair/bed transfer assist level: Supervision or verbal cues (Simultaneous filing. User may not have seen previous data.) Chair/bed transfer assistive device: Armrests (Simultaneous filing. User may not have seen previous data.)     Locomotion Ambulation     Max distance: 5 Assist level: 2 helpers   Wheelchair   Type: Manual Max wheelchair distance: 150 ft (Simultaneous filing. User may not have seen previous data.) Assist Level: No help, No cues, assistive device, takes more than reasonable amount of time (Simultaneous filing. User may not have seen previous data.)  Cognition Comprehension Comprehension assist level: Follows complex conversation/direction with no assist  Expression Expression assist level: Expresses complex ideas: With no assist  Social Interaction Social Interaction assist level: Interacts appropriately with others - No medications needed.  Problem Solving Problem solving assist level: Solves complex problems: Recognizes & self-corrects  Memory Memory assist level: Complete Independence: No helper   Medical Problem List and Plan: 1.  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   -MRI 2/2 reviewed showing increased in signal intensity throughout thoracic spine    -team conference today to review goals, ELOS  -completion of angio revealed no vascular abnormalities.  -I contacted neurology yesterday regarding follow up with the patient and review of plan,options from a  treatment standpoint. Awaiting their input  2.  DVT Prophylaxis/Anticoagulation: SCDs. Venous doppler study negative  -resumed lovenox 3. Pain Management: Hydrocodone as needed  -baclofen for spasms--reduce to 10mg  qid  -rx UTI    -seems to have responded more to low dose klonopin--increase to 0.5mg  TID 4. Hypertension. Norvasc 10 mg daily Monitor when out of bed .  Controlled 2/11 5. Neuropsych: This patient is capable of making decisions on his own behalf.  -provide ego support as needed  -pt with some anxiety related to his clinical condition 6. Skin/Wound Care: Routine skin checks 7. Fluids/Electrolytes/Nutrition:encourage PO 8.CRI stage III. Follow-up chemistries next week after angio 9. Neurogenic bowel bladder.  -no spontaneous voiding other than some incontinence   -dced urecholine   -I/o cath prn  -klebsiella UTI--R to amoxil, begin keflex 250mg  QID          Ranelle OysterZachary T. Johnathan Tortorelli, MD, [facility] ServicesFAAPMR [facility]          LOS (Days) 29 A FACE TO FACE EVALUATION WAS PERFORMED  Ranelle OysterSWARTZ,Lenka Zhao T, MD 06/30/2016 8:55 AM

## 2016-06-30 NOTE — Plan of Care (Signed)
Problem: RH Balance Goal: LTG: Patient will maintain dynamic sitting balance (OT) LTG:  Patient will maintain dynamic sitting balance with assistance during activities of daily living (OT) Added goal

## 2016-06-30 NOTE — Progress Notes (Signed)
Physical Therapy Session Note  Patient Details  Name: Perry Morrison MRN: [id number] Date of Birth: 03/30/1961  Today's Date: 06/30/2016 PT Individual Time: 0800-0900 and 1130-1200 and 1300-1330 and 1430-1500 PT Individual Time Calculation (min): 60 min and 30 min and 30 min and 30 min (total 150 min)     Short Term Goals: Week 4:  PT Short Term Goal 1 (Week 4): =LTG due to estimated LOS  Skilled Therapeutic Interventions/Progress Updates: Tx 1: Pt received seated in w/c with RN present; denies pain and agreeable to treatment. Performs grooming at sink from w/c level with modI and increased time. Provided pt with [employer] backpack and resources; discussed support groups, resources for adapting vehicles/home/work environment to be w/c accessible, and encouraged pt to browse through resources when he felt ready. Discussed that although diagnosis/prognosis is currently undetermined, he will likely have long term deficits and may benefit from the information provided in the backpack. Seated w/c pushups 2x10 reps. W/c propulsion throughout session modI. W/c mobility within ADL apartment to retrieve pillow cases, place on pillows and take dirty laundry in/out of bathroom for focus on independent mobility with w/c in home environment. Transfer w/c <>bed with transfer board and S to uneven height surface. Sit >supine minA for RLE management to prevent sliding off EOB, use of leg lifter and significantly increased time. Supine>long sit >short sit on EOB with S and increased time. Returned to w/c as above; w/c propulsion back to room at end of session modI.   Tx 2: Pt received in w/c with handoff from OT; denies pain and agreeable to treatment. Sit <>stand in parallel bars minA x4 trials; standing tolerance of approximately 1-2 min, LLE marching while therapist assisted totalA for RLE stance control. Unable to perform marching in RLE, 0/5 hip flexion noted in task. Hemi-gait with LUE on hall rail;  totalA for RLE progression and stance; maxA overall with w/c follow for safety. Ambulated 5' before requiring seated rest break. Returned to room modI.   Tx 3: Pt received supine in bed; denies pain, agreeable to tx. Rolling supine>prone performed minA for LE management, bed of bedrails and increased time. Prone lying x10 min while performing modified pushups, propped elbow walking R/L, and static prone lying for hip flexor stretch. Remained prone at end of session per pt request to nap; all needs in reach.  Tx 4: Pt received with handoff from PT after previous session; c/o fatigue but no pain and agreeable to tx. W/c propulsion modI throughout session. Standing frame x13 min total before drop in vitals and pt becoming light headed. Towel roll under balls of feet for gastroc/soleus stretch. Glute sets in standing x10 reps. Pt engaged in BUE dynamic task card game while standing to reduce reliance on UEs for balance and encourage normalized breathing pattern, reduce anxiety. Cues for UE overhead presses to A with dropping BP. Returned to room modI at end of session.      Therapy Documentation Precautions:  Precautions Precautions: Fall Precaution Comments: RLE hemiparesis Other Brace/Splint: PRAFO R foot Restrictions Weight Bearing Restrictions: No   See Function Navigator for Current Functional Status.   Therapy/Group: Individual Therapy  [name] 06/30/2016, 9:21 AM

## 2016-06-30 NOTE — Progress Notes (Signed)
Occupational Therapy Session Note  Patient Details  Name: Perry GraffJamie Maahs MRN: [id number] Date of Birth: 12/23/1960  Today's Date: 06/30/2016 OT Individual Time: 1030-1130 OT Individual Time Calculation (min): 60 min    Short Term Goals: Week 4:  OT Short Term Goal 1 (Week 4): STGs = LTGs      Skilled Therapeutic Interventions/Progress Updates:    Pt seen this session to facilitate sit to stand and standing tolerance (per pt request) and generalized UE strength and conditioning.  Pt stated that he was previously having difficulty with low blood pressure in standing and wanted to see if he was adapting more to that.  Pt used stedy lift to pull himself into full upright standing with close S. For 1 minute, 5 x pt stood with close S in stedy fully upright with his L leg supporting most of his wt.  He had some spasms in R leg with R knee flexion that was blocked from further flexion from knee pad.  His standing blood pressure was 111/78 to 112/86. He also worked on faster squat to stand from pads of stedy to upright. Pt propelled to gym and worked on UBE for 17 min at resistance 5 with 2-3 30 sec rest breaks.  Pt's PT arrived for his next session.  Therapy Documentation Precautions:  Precautions Precautions: Fall Precaution Comments: RLE hemiparesis Other Brace/Splint: PRAFO R foot Restrictions Weight Bearing Restrictions: No   Pain: Pain Assessment Pain Assessment: No/denies pain ADL:  See Function Navigator for Current Functional Status.   Therapy/Group: Individual Therapy  Khari Mally 06/30/2016, 12:26 PM

## 2016-06-30 NOTE — Progress Notes (Signed)
Physical Therapy Session Note  Patient Details  Name: Perry Morrison MRN: [id number] Date of Birth: 03/13/1961  Today's Date: 06/30/2016 PT Individual Time: [id number] PT Individual Time Calculation (min): 40 min   Short Term Goals: Week 3:  PT Short Term Goal 1 (Week 3): =LTG downgraded to S w/c level   Skilled Therapeutic Interventions/Progress Updates:    Pt received in bed & agreeable to tx, denying c/o pain. Pt prone & requesting assistance to roll to supine. Pt required mod assist to maneuver legs and hips to roll. Pt requesting to stretch BLE; pt performed hip/knee flexion & hip adduction stretch RLE with use of leg lifter. Educated pt on bed positioning for improved stretching as well as stretching regimen. Therapist performed RLE heel cord, hamstring & hip adductor stretches. Pt transferred supine>sitting EOB with use of hospital bed features, trapeze & leg lifter. Pt completed squat pivot bed>w/c with set up assist and close supervision. Pt able to manage w/c leg rests without assistance. Pt completed grooming tasks at sink from w/c level with mod I. Pt propelled w/c to day room with Mod I. Pt left in handoff to next PT.  Therapy Documentation Precautions:  Precautions Precautions: Fall Precaution Comments: RLE hemiparesis Other Brace/Splint: PRAFO R foot Restrictions Weight Bearing Restrictions: No    See Function Navigator for Current Functional Status.   Therapy/Group: Individual Therapy  Sandi MariscalVictoria M Raigan Baria 06/30/2016, 2:32 PM

## 2016-07-01 ENCOUNTER — Encounter (HOSPITAL_COMMUNITY): Payer: Self-pay | Admitting: Physical Therapy

## 2016-07-01 ENCOUNTER — Encounter (HOSPITAL_COMMUNITY): Payer: Self-pay | Admitting: Psychology

## 2016-07-01 ENCOUNTER — Inpatient Hospital Stay (HOSPITAL_COMMUNITY): Payer: Medicaid Other | Admitting: Occupational Therapy

## 2016-07-01 ENCOUNTER — Inpatient Hospital Stay (HOSPITAL_COMMUNITY): Payer: Self-pay | Admitting: Physical Therapy

## 2016-07-01 DIAGNOSIS — G373 Acute transverse myelitis in demyelinating disease of central nervous system: Secondary | ICD-10-CM

## 2016-07-01 LAB — CSF CELL COUNT WITH DIFFERENTIAL
RBC Count, CSF: 255 /mm3 — ABNORMAL HIGH
TUBE #: 1
WBC, CSF: 4 /mm3 (ref 0–5)

## 2016-07-01 LAB — PROTEIN AND GLUCOSE, CSF
Glucose, CSF: 66 mg/dL (ref 40–70)
TOTAL PROTEIN, CSF: 146 mg/dL — AB (ref 15–45)

## 2016-07-01 NOTE — Progress Notes (Signed)
Occupational Therapy Session Note  Patient Details  Name: Aurea GraffJamie Kamphuis MRN: [id number] Date of Birth: 03/20/1961  Today's Date: 07/01/2016 OT Individual Time: [id number] OT Individual Time Calculation (min): 20 min (missed 40 min due to medical procedure)   Short Term Goals: Week 4:  OT Short Term Goal 1 (Week 4): STGs = LTGs  Skilled Therapeutic Interventions/Progress Updates:    Pt seen this session to prepare for his lunch outing at 1030.  Pt had already washed up and dressed. Worked on strategies for donning shoes with crossing foot over opposite knee with A to stabilize leg. Pt would benefit from a shoe horn to completely put shoe on.  Pt propelling to gym when MDs arrived to complete medical procedure on pt. Pt taken back to room and transferred into bed without board with close S.   Therapy Documentation Precautions:  Precautions Precautions: Fall Precaution Comments: RLE hemiparesis Other Brace/Splint: PRAFO R foot Restrictions Weight Bearing Restrictions: No  Pain: no c/o pain   ADL:    See Function Navigator for Current Functional Status.   Therapy/Group: Individual Therapy  Jakyia Gaccione 07/01/2016, 10:01 AM

## 2016-07-01 NOTE — Procedures (Signed)
Indication: Evaluation for NMO antibodies and autoimmune mu;tiple sclerosis  Risks of the procedure were dicussed with the patient including post-LP headache, bleeding, infection, weakness/numbness of legs(radiculopathy), death.  The patient/patient's proxy agreed and written consent was obtained.   The patient was prepped and draped, and using sterile technique a 20 gauge quinke spinal needle was inserted in the L3/4 space. Marland Kitchen. Approximately 13 cc of CSF were obtained and sent for analysis. No complications were had  Felicie MornDavid Smith PA-C [facility] [phone]  M-F  (8:30 am- 4 PM)  07/01/2016, 10:36 AM

## 2016-07-01 NOTE — Progress Notes (Signed)
Physical Therapy Session Note  Patient Details  Name: Aurea GraffJamie Lopresti MRN: [id number] Date of Birth: 11/29/1960  Today's Date: 07/10/2016   Skilled Therapeutic Interventions/Progress Updates:    Pt unable to attend outing on 07/01/16 due to awaiting lumbar puncture. Pt missed 120 minutes of PT treatment time.   Therapy Documentation Precautions:  Precautions Precautions: Fall Precaution Comments: RLE hemiparesis Other Brace/Splint: PRAFO R foot Restrictions Weight Bearing Restrictions: No   See Function Navigator for Current Functional Status.   Therapy/Group: Individual Therapy  Sandi MariscalVictoria M Nadav Swindell 07/10/2016, 7:52 AM

## 2016-07-01 NOTE — Progress Notes (Signed)
Recreational Therapy Session Note  Patient Details  Name: Perry GraffJamie Carrillo MRN: [id number] Date of Birth: 04/08/1961 Today's Date: 07/01/2016  Outing scheduled for 120 minutes of therapy time cancelled today due to medical procedure. Darcy Cordner 07/01/2016, 4:02 PM

## 2016-07-01 NOTE — Progress Notes (Signed)
[facility] PHYSICAL MEDICINE & REHABILITATION     PROGRESS NOTE    Subjective/Complaints:  Up with RN working on bladder cath. Spasms are better with klonopin.   ROS: pt denies nausea, vomiting, diarrhea, cough, shortness of breath or chest pain     Objective: Vital Signs: Blood pressure 121/79, pulse 77, temperature 98.1 F (36.7 C), temperature source Oral, resp. rate 18, height 6\' 1"  (1.854 m), weight 97 kg (213 lb 12.8 oz), SpO2 100 %. No results found. No results for input(s): WBC, HGB, HCT, PLT in the last 72 hours. No results for input(s): NA, K, CL, GLUCOSE, BUN, CREATININE, CALCIUM in the last 72 hours.  Invalid input(s): CO CBG (last 3)  No results for input(s): GLUCAP in the last 72 hours.  Wt Readings from Last 3 Encounters:  06/24/16 97 kg (213 lb 12.8 oz)  05/26/16 102.1 kg (225 lb)    Physical Exam:  Gen: NAD. Marland Kitchen. HENT: Normocephalic and atraumatic.  Eyes: EOM are normal. No discharge.  Cardiovascular:RRR Respiratory:CTA B GI: ND,NT  Musculoskeletal: He exhibits no edema or tenderness.   Neurological:  He is alert and oriented.  B/l UE 5/5.  RLE: 0/5 HF to distal--stable  LLE: 1+/5 HF, 2- to 2/5 KE and 2/5 ADF/PF-- no change -improved extensor tone in LE's especially left side-- remains 1-2/4. DTR's 3+  Decreased sensation below level of injury, right leg more affected than left--stable Skin: Skin remains warm and dry. Intact.  Psych: remains pleasant  Assessment/Plan: 1. Functional and mobility deficits secondary to thoracic myelopathy which require 3+ hours per day of interdisciplinary therapy in a comprehensive inpatient rehab setting. Physiatrist is providing close team supervision and 24 hour management of active medical problems listed below. Physiatrist and rehab team continue to assess barriers to discharge/monitor patient progress toward functional and medical goals.  Function:  Bathing Bathing position   Position: Other (comment)  (sitting on tub bench, tub/shower)  Bathing parts Body parts bathed by patient: Right arm, Left arm, Chest, Front perineal area, Abdomen, Buttocks, Right upper leg, Left upper leg, Right lower leg, Left lower leg Body parts bathed by helper: Back  Bathing assist Assist Level: Touching or steadying assistance(Pt > 75%)   Set up : To obtain items, To adjust water temperature  Upper Body Dressing/Undressing Upper body dressing   What is the patient wearing?: Pull over shirt/dress     Pull over shirt/dress - Perfomed by patient: Thread/unthread right sleeve, Thread/unthread left sleeve, Put head through opening, Pull shirt over trunk          Upper body assist Assist Level: Set up   Set up : To obtain clothing/put away  Lower Body Dressing/Undressing Lower body dressing   What is the patient wearing?: Pants Underwear - Performed by patient: Thread/unthread left underwear leg, Pull underwear up/down Underwear - Performed by helper: Thread/unthread right underwear leg, Thread/unthread left underwear leg, Pull underwear up/down Pants- Performed by patient: Thread/unthread right pants leg, Thread/unthread left pants leg, Pull pants up/down Pants- Performed by helper: Pull pants up/down Non-skid slipper socks- Performed by patient: Don/doff right sock, Don/doff left sock Non-skid slipper socks- Performed by helper: Don/doff right sock, Don/doff left sock     Shoes - Performed by patient: Don/doff right shoe, Don/doff left shoe, Fasten right Shoes - Performed by helper: Fasten left (assist with placement of LLE and stabilize leg on opposite knee while pt tied shoe) AFO - Performed by patient: Don/doff right AFO     TED Hose -  Performed by helper: Don/doff right TED hose, Don/doff left TED hose  Lower body assist Assist for lower body dressing: Touching or steadying assistance (Pt > 75%)      Toileting Toileting Toileting activity did not occur: No continent bowel/bladder  event Toileting steps completed by patient: Performs perineal hygiene Toileting steps completed by helper: Adjust clothing after toileting, Adjust clothing prior to toileting Toileting Assistive Devices: Grab bar or rail  Toileting assist Assist level: Touching or steadying assistance (Pt.75%)   Transfers Chair/bed transfer Chair/bed transfer activity did not occur: N/A Chair/bed transfer method: Squat pivot Chair/bed transfer assist level: Supervision or verbal cues Chair/bed transfer assistive device: Armrests     Locomotion Ambulation     Max distance: 5 Assist level: 2 helpers   Wheelchair   Type: Manual Max wheelchair distance: 150 ft Assist Level: No help, No cues, assistive device, takes more than reasonable amount of time  Cognition Comprehension Comprehension assist level: Follows complex conversation/direction with no assist  Expression Expression assist level: Expresses complex ideas: With no assist  Social Interaction Social Interaction assist level: Interacts appropriately with others - No medications needed.  Problem Solving Problem solving assist level: Solves complex problems: Recognizes & self-corrects  Memory Memory assist level: Complete Independence: No helper   Medical Problem List and Plan: 1.  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   -MRI 2/2 reviewed showing increased in signal intensity throughout thoracic spine    -continue CIR therapies  -appreciate Dr. Catha Brow consult and  Recommendations. Labs, LP as he's requested  2.  DVT Prophylaxis/Anticoagulation: SCDs. Venous doppler study negative  -resumed lovenox (hold for LP) 3. Pain Management: Hydrocodone as needed  -baclofen for spasms-  10mg  qid  -rx UTI    - klonopin--increased to 0.5mg  TID--has had much better results with this. 4. Hypertension. Norvasc 10 mg daily Monitor when out of bed .  Controlled 2/11 5. Neuropsych: This patient is capable  of making decisions on his own behalf.  -provide ego support as needed  -pt with some anxiety related to his clinical condition 6. Skin/Wound Care: Routine skin checks 7. Fluids/Electrolytes/Nutrition:encourage PO 8.CRI stage III. Follow-up chemistries next week after angio 9. Neurogenic bowel bladder.  -no spontaneous voiding other than some incontinence   -pt able to perform self-caths   -klebsiella UTI--  keflex 250mg  QID through 2/26  -coming closer to establishing consistency with daily bowel program.          [name], MD, [facility] Physical Medicine & Rehabilitation          LOS (Days) 30 A FACE TO FACE EVALUATION WAS PERFORMED  [name], MD 07/01/2016 9:48 AM

## 2016-07-01 NOTE — Consult Note (Signed)
Patient:  Perry GraffJamie Lokken   DOB: 03/05/1961  MR Number: [id number]  [facility] A [address] [id number] [place] [id number] Dept: [phone] Loc: [phone]  Start: 3:25 End: 4:30  Provider/Observer:        Chief Complaint:     Stress and difficulty adjusting to medical issues.  Reason For Service:     The patient was referred for a psychological/neuropsychological consultation.  The patient developed abdominal and back pain that progressed to leg weakness and loss of sensation.  He was admitted to the inpatient unit after ED encounter with initial considerations of Myelitis then there were considerations of a spinal aneurism. However, recent angiogram seem to rule out aneurism.  The patient had been having some possible symptoms for some time.  He had seen a chiropractor up until about a year ago but this did not seem to help his back and stomach pain.  He described symptoms of sever pain going from his toes to his stomach after orgasm during sex.  This would not be associated to physical activity but acutely at time of orgasm.   He also reported that he had very acute leg weakness at various times when he would play tennis, but these would go away and he would feel fine.  The patient had been a very active person and this sudden change in functioning has created great adjustment issues and fear about what the future holds from him.   The patient describes a lot of stress in his life prior to his hospitalization.  He reports that he and his wife divorced and there was a lot of anger.  He reports the two of them were barely on speaking terms.  The patient reports that she has been to see him in the hospital but his 309 yo son has not been able to due to hospital restrictions.  Interventions Strategy:  Building coping skills  Participation Level:   Active  Participation Quality:  Appropriate       Behavioral Observation:  Well Groomed, Alert and Lethargic, and Appropriate.   Current Psychosocial Factors: The patient reports that he has been worrying more about what will happen to his son if he does not get better.  Content of Session:   Reviewed current stressors and worked on Pharmacologistcoping skills  Current Status:   The patient reports that he has been frustrated with him not getting better right away.  Patient Progress:   More worry and frustration  Impression/Diagnosis:   The patient is dealing with acute onset of paraplegia with etiological factors still being addessed.  He was a very active person playing tennis, pickleball, and working out at [employer] gym on a regular basis.  He is dealing with nearly total loss of function in his legs.  Diagnosis:

## 2016-07-01 NOTE — Plan of Care (Signed)
Problem: RH BLADDER ELIMINATION Goal: RH STG MANAGE BLADDER WITH EQUIPMENT WITH ASSISTANCE STG Manage Bladder With Equipment With min Assistance   Outcome: Progressing Pt learning how to self cath- set up from staff

## 2016-07-01 NOTE — Progress Notes (Signed)
Subjective: No change. Obtaining LP today and nervous about this.   Exam: Vitals:   06/30/16 1512 07/01/16 0436  BP: 129/79 121/79  Pulse: 97 77  Resp: 18 18  Temp: 98.4 F (36.9 C) 98.1 F (36.7 C)    Neuro: MS: As noted above. No aphasia.  CN: Pupils are equal and reactive from 3-->2 mm bilaterally. EOMI, no nystagmus. Face is symmetric at rest with normal strength and mobility. Hearing is intact to conversational voice. Voice is normal in tone and quality.  Motor: Normal bulk, tone, and strength in the arms. He has no movement of the R foot, ankle, or lower leg with 2/5 strength in the R hip. L PF is 5/5, L DF is 4+/5. Occasional spasms are noted in both legs.  Sensation: Light touch is intact throughout. Pinprick is reduced in the LLE.  DTRs: 2+, symmetric in the arms, absent in the legs. Toes are mute bilaterally. No pathological reflexes.    Pertinent Labs/Diagnostics: LP performed today and sent.   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 This is a rare entity that has been associated with infection (HIV, VZV, HSV-2), post-infectious demyelination (ADEM), flu vaccination, neoplasm, paraneoplastic antibodies (CRMP-5, voltage-gated calcium channels, voltage-gated potassium channels, amphiphysin, ganglionic ACh receptor, ANNA-1, and ANNA-2). Selective spinal angiography was completed on 06/26/16 and showed no evidence of dural AV fistula or other vascular anomaly that could explain his presentation. Treatment is unfortunately limited. He received steroids at the time of presentation but did not appreciate any significant benefit. Given clinical stability, there is likely little utility in repeating steroids or giving IVIg. If CSF serologies suggest HSV or VZV involvement, would initiate IV acyclovir.     Recommend:  1) Today we repeated lumbar puncture and send CSF for cell count, protein, glucose, oligoclonal bands, cytology, HSV PCR, VZV PCR, VZV IgG, and VZV IgM  2. Paraparesis: He has severe weakness of the right lower extremity with lesser weakness on the left due to his transverse myelitis. Symptoms appear to be stable for the most part. Continue PT/rehab.   3. Urinary retention: This is due to transverse myelitis. Continue rehab.   4. Left lower extremity sensory loss: This is due to his transverse myelitis. Continue PT/rehabilitation.  Will continue to follow   07/01/2016, 10:37 AM

## 2016-07-01 NOTE — Progress Notes (Signed)
Physical Therapy Session Note  Patient Details  Name: Perry GraffJamie Morrison MRN: [id number] Date of Birth: 01/28/1961  Today's Date: 07/01/2016  Short Term Goals: Week 3:  PT Short Term Goal 1 (Week 3): =LTG downgraded to S w/c level  Week 4:  PT Short Term Goal 1 (Week 4): =LTG due to estimated LOS  Skilled Therapeutic Interventions/Progress Updates: Pt received supine in bed, c/o pain in back and fatigue from procedure earlier today; states he has not had a chance to rest d/t staff being in and out of his room all afternoon. Pt politely declined participation; therapist encouraged pt to rest for the next hour and then be able to participate in scheduled session with neuropsych. Continue per POC.     Therapy Documentation Precautions:  Precautions Precautions: Fall Precaution Comments: RLE hemiparesis Other Brace/Splint: PRAFO R foot Restrictions Weight Bearing Restrictions: No General: PT Amount of Missed Time (min): 60 Minutes PT Missed Treatment Reason: Pain;Patient fatigue   See Function Navigator for Current Functional Status.   Therapy/Group: Individual Therapy  [name] 07/01/2016, 3:03 PM

## 2016-07-02 ENCOUNTER — Inpatient Hospital Stay (HOSPITAL_COMMUNITY): Payer: Self-pay | Admitting: Physical Therapy

## 2016-07-02 ENCOUNTER — Inpatient Hospital Stay (HOSPITAL_COMMUNITY): Payer: Medicaid Other | Admitting: Occupational Therapy

## 2016-07-02 LAB — HIV ANTIBODY (ROUTINE TESTING W REFLEX): HIV Screen 4th Generation wRfx: NONREACTIVE

## 2016-07-02 LAB — IGG CSF INDEX
ALBUMIN CSF-MCNC: 74 mg/dL — AB (ref 11–48)
ALBUMIN: 3.8 g/dL (ref 3.5–5.5)
CSF IgG Index: 0.5 (ref 0.0–0.7)
IGG (IMMUNOGLOBIN G), SERUM: 807 mg/dL (ref 700–1600)
IgG, CSF: 8.2 mg/dL (ref 0.0–8.6)
IgG/Alb Ratio, CSF: 0.11 (ref 0.00–0.25)

## 2016-07-02 LAB — HERPES SIMPLEX VIRUS(HSV) DNA BY PCR
HSV 1 DNA: NEGATIVE
HSV 2 DNA: NEGATIVE

## 2016-07-02 NOTE — Progress Notes (Signed)
Occupational Therapy Session Note  Patient Details  Name: Perry Morrison MRN: [id number] Date of Birth: 02/01/1961  Today's Date: 07/02/2016 OT Individual Time: 1502-1530 OT Individual Time Calculation (min): 28 min    Short Term Goals: Week 4:  OT Short Term Goal 1 (Week 4): STGs = LTGs  Skilled Therapeutic Interventions/Progress Updates:    Pt seated in recliner chair upon entering the room with RN present giving medications. Pt agreeable to OT intervention with encouragement as he reports being in discomfort from medical procedure on 2/21. OT demonstrated use of 3 lb dowel rod for B UE strengthening exercises. Pt returned demonstrations with 3 sets of 15 straight arm raises, chest press, and bicep curls with min verbal cues for proper technique. Pt needing rest break between sets secondary to fatigue. Pt pushing up through B UE in chair push up in order for therapist to place wheelchair cushion in recliner for comfort. Call bell and all needed items within reach upon exiting the room.   Therapy Documentation Precautions:  Precautions Precautions: Fall Precaution Comments: RLE hemiparesis Other Brace/Splint: PRAFO R foot Restrictions Weight Bearing Restrictions: No General:   Vital Signs: Therapy Vitals Temp: 98.1 F (36.7 C) Temp Source: Oral Pulse Rate: 94 Resp: 18 BP: 114/65 Patient Position (if appropriate): Sitting Oxygen Therapy SpO2: 100 % O2 Device: Not Delivered Pain:   ADL:   Exercises:   Other Treatments:    See Function Navigator for Current Functional Status.   Therapy/Group: Individual Therapy  Alen BleacherBradsher, Emberleigh Reily P 07/02/2016, 4:48 PM

## 2016-07-02 NOTE — Progress Notes (Signed)
[facility]     PROGRESS NOTE    Subjective/Complaints:  Up with RN working on bladder cath. Spasms are better with klonopin.   ROS: pt denies nausea, vomiting, diarrhea, cough, shortness of breath or chest pain     Objective: Vital Signs: Blood pressure 109/62, pulse 85, temperature 98.2 F (36.8 C), temperature source Oral, resp. rate 18, height 6\' 1"  (1.854 m), weight 97 kg (213 lb 12.8 oz), SpO2 100 %. No results found. No results for input(s): WBC, HGB, HCT, PLT in the last 72 hours. No results for input(s): NA, K, CL, GLUCOSE, BUN, CREATININE, CALCIUM in the last 72 hours.  Invalid input(s): CO CBG (last 3)  No results for input(s): GLUCAP in the last 72 hours.  Wt Readings from Last 3 Encounters:  06/24/16 97 kg (213 lb 12.8 oz)  05/26/16 102.1 kg (225 lb)    Physical Exam:  Gen: NAD. Marland Kitchen HENT: Normocephalic and atraumatic.  Eyes: EOM are normal. No discharge.  Cardiovascular:RRR Respiratory:CTA B GI: ND,NT  Musculoskeletal: low back sore. LP site normal in appearance   Neurological:  He is alert and oriented.  B/l UE 5/5.  RLE: 0/5 HF to distal--stable  LLE: 1+/5 HF, 2- to 2/5 KE and 2/5 ADF/PF-- no change -improved extensor tone in LE's especially left side-- remains tr/4---Substantial improvement.   DTR's 3+  Decreased sensation below level of injury, right leg more affected than left--stable Skin: Skin remains warm and dry. Intact.  Psych: remains pleasant  Assessment/Plan: 1. Functional and mobility deficits secondary to thoracic myelopathy which require 3+ hours per day of interdisciplinary therapy in a comprehensive inpatient rehab setting. Physiatrist is providing close team supervision and 24 hour management of active medical problems listed below. Physiatrist and rehab team continue to assess barriers to discharge/monitor patient progress toward functional and medical goals.  Function:  Bathing Bathing  position   Position: Other (comment) (sitting on tub bench, tub/shower)  Bathing parts Body parts bathed by patient: Right arm, Left arm, Chest, Front perineal area, Abdomen, Buttocks, Right upper leg, Left upper leg, Right lower leg, Left lower leg Body parts bathed by helper: Back  Bathing assist Assist Level: Touching or steadying assistance(Pt > 75%)   Set up : To obtain items, To adjust water temperature  Upper Body Dressing/Undressing Upper body dressing   What is the patient wearing?: Pull over shirt/dress     Pull over shirt/dress - Perfomed by patient: Thread/unthread right sleeve, Thread/unthread left sleeve, Put head through opening, Pull shirt over trunk          Upper body assist Assist Level: Set up   Set up : To obtain clothing/put away  Lower Body Dressing/Undressing Lower body dressing   What is the patient wearing?: Pants Underwear - Performed by patient: Thread/unthread left underwear leg, Pull underwear up/down Underwear - Performed by helper: Thread/unthread right underwear leg, Thread/unthread left underwear leg, Pull underwear up/down Pants- Performed by patient: Thread/unthread right pants leg, Thread/unthread left pants leg, Pull pants up/down Pants- Performed by helper: Pull pants up/down Non-skid slipper socks- Performed by patient: Don/doff right sock, Don/doff left sock Non-skid slipper socks- Performed by helper: Don/doff right sock, Don/doff left sock     Shoes - Performed by patient: Don/doff right shoe, Don/doff left shoe, Fasten right Shoes - Performed by helper: Fasten left (assist with placement of LLE and stabilize leg on opposite knee while pt tied shoe) AFO - Performed by patient: Don/doff right AFO  TED Hose - Performed by helper: Don/doff right TED hose, Don/doff left TED hose  Lower body assist Assist for lower body dressing: Touching or steadying assistance (Pt > 75%)      Toileting Toileting Toileting activity did not occur: No  continent bowel/bladder event Toileting steps completed by patient: Performs perineal hygiene Toileting steps completed by helper: Adjust clothing prior to toileting, Adjust clothing after toileting Toileting Assistive Devices: Other (comment) (steady)  Toileting assist Assist level: Two helpers (steady)   Transfers Chair/bed transfer Chair/bed transfer activity did not occur: N/A Chair/bed transfer method: Squat pivot Chair/bed transfer assist level: Supervision or verbal cues Chair/bed transfer assistive device: Armrests     Locomotion Ambulation     Max distance: 5 Assist level: 2 helpers   Wheelchair   Type: Manual Max wheelchair distance: 150 ft Assist Level: No help, No cues, assistive device, takes more than reasonable amount of time  Cognition Comprehension Comprehension assist level: Follows complex conversation/direction with no assist  Expression Expression assist level: Expresses complex ideas: With no assist  Social Interaction Social Interaction assist level: Interacts appropriately with others with medication or extra time (anti-anxiety, antidepressant).  Problem Solving Problem solving assist level: Solves complex problems: Recognizes & self-corrects  Memory Memory assist level: Complete Independence: No helper   Medical Problem List and Plan: 1.  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   -MRI 2/2 reviewed showing increased in signal intensity throughout thoracic spine    -continue CIR therapies  -appreciate Dr. Catha Browster's consult and  Recommendations.   -CSF with elevated protein (146), other labs pending 2.  DVT Prophylaxis/Anticoagulation: SCDs. Venous doppler study negative  -resumed lovenox   3. Pain Management: Hydrocodone as needed  -baclofen for spasms-  10mg  qid--continue at current dose  - klonopin--increased to 0.5mg  TID--with improvement in tone.  -rxing UTI 4. Hypertension. Norvasc 10 mg daily  Monitor when out of bed .  Controlled 2/11 5. Neuropsych: This patient is capable of making decisions on his own behalf.  -provide ego support as needed  -pt with some anxiety related to his clinical condition 6. Skin/Wound Care: Routine skin checks 7. Fluids/Electrolytes/Nutrition:encourage PO 8.CRI stage III. Follow-up chemistries next week after angio 9. Neurogenic bowel bladder.  -no spontaneous voiding other than some incontinence   -pt able to perform self-caths   -klebsiella UTI--  keflex 250mg  QID through 2/26  -coming closer to establishing consistency with daily bowel program.          Ranelle OysterZachary T. Emmaline Wahba, MD, [facility]FAAPMR [facility]          LOS (Days) 31 A FACE TO FACE EVALUATION WAS PERFORMED  Ranelle OysterSWARTZ,Rocklin Soderquist T, MD 07/02/2016 8:50 AM

## 2016-07-02 NOTE — Progress Notes (Signed)
Physical Therapy Session Note  Patient Details  Name: Perry Morrison MRN: [id number] Date of Birth: 11/27/1960  Today's Date: 07/02/2016 PT Individual Time: 1000-1100 and 1345-1430 PT Individual Time Calculation (min): 60 min and 45 min (total 105 min)   Short Term Goals: Week 4:  PT Short Term Goal 1 (Week 4): =LTG due to estimated LOS  Skilled Therapeutic Interventions/Progress Updates: Tx 1: Pt received supine in bed, c/o pain in low back as below and agreeable to treatment with encouragement. BLE PROM hip ER, hamstrings 1x1 min each. Rolling supine>L sidelying with minA for LE management; remained sidelying several minutes to allow pain to subside. Sidelying>sit with minA and bedrails. Pt noted to be very hesitant and slow moving with all activities. Transfer bed>w/c with transfer board close S and increased time. Sit >stand in stedy modA; transferred to recliner totalA in sted. Remained seated in recliner with LEs elevated and all needs in reach at end of session.   Tx 2: Pt received seated in recliner; denies pain and agreeable to treatment. Sit <>stand in stedy modA; sit <>stand x3 reps in stedy, cues for trunk/hip extension. Standing tolerance of approx 1 min per trial; pt reporting light headedness on last trial. Remainder of session with focus on w/c propulsion in indoor/outdoor environments, level/unlevel surfaces with modI; requires occasional rest breaks d/t fatigue. Remained seated in w/c at end of session, all needs in reach.      Therapy Documentation Precautions:  Precautions Precautions: Fall Precaution Comments: RLE hemiparesis Other Brace/Splint: PRAFO R foot Restrictions Weight Bearing Restrictions: No Pain: Pain Assessment Faces Pain Scale: Hurts even more Pain Type: Acute pain Pain Location: Back Pain Intervention(s): Repositioned;Heat applied   See Function Navigator for Current Functional Status.   Therapy/Group: Individual Therapy  Vista Lawmanlizabeth J  Tygielski 07/02/2016, 12:14 PM

## 2016-07-02 NOTE — Progress Notes (Signed)
Occupational Therapy Session Note  Patient Details  Name: Perry Morrison MRN: [id number] Date of Birth: Aug 01, 1960  Today's Date: 07/02/2016 OT Individual Time: 1120-1200 OT Individual Time Calculation (min): 40 min    Short Term Goals: Week 4:  OT Short Term Goal 1 (Week 4): STGs = LTGs  Skilled Therapeutic Interventions/Progress Updates:    Pt in recliner with heat on his back due to extreme discomfort from a medical procedure yesterday. Pt not feeling well and did not want to get out of recliner.  He was concerned about his legs getting stiff and requested to work on that.  PROM and stretching for R leg and A/arom and stretching for L leg focusing on mobility of hip flexion, hip internal/external rotation, knee flexion, ankle flex/ext, rotation.  Pt resting in recliner with all needs met.   Therapy Documentation Precautions:  Precautions Precautions: Fall Precaution Comments: RLE hemiparesis Other Brace/Splint: PRAFO R foot Restrictions Weight Bearing Restrictions: No  Pain: Pain Assessment Faces Pain Scale: Hurts even more Pain Type: Acute pain Pain Location: Back Pain Intervention(s): Repositioned;Heat applied ADL:    See Function Navigator for Current Functional Status.   Therapy/Group: Individual Therapy  Quana Chamberlain 07/02/2016, 12:39 PM

## 2016-07-02 NOTE — Progress Notes (Signed)
Subjective: No change since yesterday with the patient. He seems to be in a slightly depressed mood. Long discussion was had with the patient about the LP findings and the awaiting LP findings.  Objective: Current vital signs: BP 109/62 (BP Location: Left Arm)   Pulse 85   Temp 98.2 F (36.8 C) (Oral)   Resp 18   Ht 6\' 1"  (1.854 m)   Wt 97 kg (213 lb 12.8 oz)   SpO2 100%   BMI 28.21 kg/m  Vital signs in last 24 hours: Temp:  [97.8 F (36.6 C)-98.2 F (36.8 C)] 98.2 F (36.8 C) (02/22 0350) Pulse Rate:  [77-85] 85 (02/22 0350) Resp:  [18] 18 (02/22 0350) BP: (109-122)/(62-74) 109/62 (02/22 0350) SpO2:  [100 %] 100 % (02/22 0350)  Intake/Output from previous day: 02/21 0701 - 02/22 0700 In: 780 [P.O.:780] Out: 2925 [Urine:2925] Intake/Output this shift: No intake/output data recorded. Nutritional status: Diet regular Room service appropriate? Yes; Fluid consistency: Thin      Neurologic Exam: Exam: MS: normal CN: 2-12 unremarkable Motor: Normal strength in the arms. He has an asymmetric paraparesis/paraplegia. In the RLE he is 0/5 with the exception of 2/5 HF. The LLE is 4 to 4+/5. He has occasional LE spasms.  Sensation: Decreased in the LLE.  DTRs: 2+ in arms, absent in legs. Toes mute.   Lab Results: Basic Metabolic Panel:  Recent Labs Lab 06/26/16 0507  NA 136  K 4.1  CL 98*  CO2 28  GLUCOSE 107*  BUN 15  CREATININE 1.00  CALCIUM 9.4    Liver Function Tests: No results for input(s): AST, ALT, ALKPHOS, BILITOT, PROT, ALBUMIN in the last 168 hours. No results for input(s): LIPASE, AMYLASE in the last 168 hours. No results for input(s): AMMONIA in the last 168 hours.  CBC: No results for input(s): WBC, NEUTROABS, HGB, HCT, MCV, PLT in the last 168 hours.  Cardiac Enzymes: No results for input(s): CKTOTAL, CKMB, CKMBINDEX, TROPONINI in the last 168 hours.  Lipid Panel: No results for input(s): CHOL, TRIG, HDL, CHOLHDL, VLDL, LDLCALC in the last  161168 hours.  CBG: No results for input(s): GLUCAP in the last 168 hours.  Microbiology: Results for orders placed or performed during the hospital encounter of 06/01/16  Culture, Urine     Status: Abnormal   Collection Time: 06/08/16  6:51 PM  Result Value Ref Range Status   Specimen Description URINE, CATHETERIZED  Final   Special Requests NONE  Final   Culture MULTIPLE SPECIES PRESENT, SUGGEST RECOLLECTION (A)  Final   Report Status 06/09/2016 FINAL  Final  Culture, Urine     Status: Abnormal   Collection Time: 06/09/16  6:14 PM  Result Value Ref Range Status   Specimen Description URINE, CATHETERIZED  Final   Special Requests NONE  Final   Culture <10,000 COLONIES/mL INSIGNIFICANT GROWTH (A)  Final   Report Status 06/10/2016 FINAL  Final  Urine culture     Status: Abnormal   Collection Time: 06/28/16  4:21 PM  Result Value Ref Range Status   Specimen Description URINE, CATHETERIZED  Final   Special Requests NONE  Final   Culture >=100,000 COLONIES/mL KLEBSIELLA PNEUMONIAE (A)  Final   Report Status 06/30/2016 FINAL  Final   Organism ID, Bacteria KLEBSIELLA PNEUMONIAE (A)  Final      Susceptibility   Klebsiella pneumoniae - MIC*    AMPICILLIN >=32 RESISTANT Resistant     CEFAZOLIN <=4 SENSITIVE Sensitive     CEFTRIAXONE <=1 SENSITIVE  Sensitive     CIPROFLOXACIN <=0.25 SENSITIVE Sensitive     GENTAMICIN <=1 SENSITIVE Sensitive     IMIPENEM <=0.25 SENSITIVE Sensitive     NITROFURANTOIN 32 SENSITIVE Sensitive     TRIMETH/SULFA <=20 SENSITIVE Sensitive     AMPICILLIN/SULBACTAM 4 SENSITIVE Sensitive     PIP/TAZO <=4 SENSITIVE Sensitive     Extended ESBL NEGATIVE Sensitive     * >=100,000 COLONIES/mL KLEBSIELLA PNEUMONIAE   LP results: Protein 146 Glucose 66  Tube 1 showed colorless, clear, 4 white blood cells, 255 for red blood cells Pending labs include: HSV, all of the clonal bands, IgG index, cytology, varicella-zoster, NMO antibodies    Coagulation Studies: No  results for input(s): LABPROT, INR in the last 72 hours.  Imaging: No results found.  Medications:  Prior to Admission:  Prescriptions Prior to Admission  Medication Sig Dispense Refill Last Dose  . amLODipine (NORVASC) 10 MG tablet Take 1 tablet (10 mg total) by mouth daily. 30 tablet 3   . hydrocortisone (ANUSOL-HC) 25 MG suppository Place 1 suppository (25 mg total) rectally daily. 12 suppository 0   . MAGNESIUM PO Take 1 tablet by mouth daily.   Past Week at Unknown time  . Omega-3 Fatty Acids (OMEGA-3 PO) Take 1 capsule by mouth daily.   Past Week at Unknown time  . pantoprazole (PROTONIX) 40 MG tablet Take 1 tablet (40 mg total) by mouth daily. 30 tablet 0   . polyethylene glycol (MIRALAX / GLYCOLAX) packet Take 34 g by mouth 2 (two) times daily. 60 packet 3   . senna-docusate (SENOKOT-S) 8.6-50 MG tablet Take 1 tablet by mouth 2 (two) times daily. 60 tablet 3     Assessment/Plan: Impression: 1. 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 Paraparesis  3. LLE sensory loss 4. Urinary retention  Recommendations:  Continue rehab services and supportive care otherwise. Still awaiting multiple CSF labs      Felicie Morn PA-C [facility] [phone]  07/02/2016, 10:52 AM

## 2016-07-02 NOTE — Progress Notes (Signed)
Physical Therapy Session Note  Patient Details  Name: Perry Morrison MRN: [id number] Date of Birth: 05/16/1960  Today's Date: 07/02/2016   Short Term Goals: Week 4:  PT Short Term Goal 1 (Week 4): =LTG due to estimated LOS  Skilled Therapeutic Interventions/Progress Updates:   Pt received supine in bed declined PT at this time. Pt reports that his back is extremely painful and stiff following lumbar puncture yesterday. He also states that with extra pain medicine his feels "out of it"  PT will re-attempt at later time, schedule permitting .        Therapy Documentation Precautions:  Precautions Precautions: Fall Precaution Comments: RLE hemiparesis Other Brace/Splint: PRAFO R foot Restrictions Weight Bearing Restrictions: No General:   Missed time: 30 min (fatigue/pain)  Pain:  10/10 Low back. Aching and stiff.     See Function Navigator for Current Functional Status.   Therapy/Group: Individual Therapy  Golden Popustin E Mayme Profeta 07/02/2016, 7:58 AM

## 2016-07-02 NOTE — Patient Care Conference (Signed)
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 Transverse myelitis (HCC)   . Left leg numbness   . Distended abdomen   . Benign essential HTN   . 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 Weakness of right leg   . Neurogenic bowel   . Neurogenic bladder   . Incomplete paraplegia (HCC)   . GI bleed 05/30/2016  . Hypertension 05/26/2016  . Constipation 05/26/2016  . Abnormal MRI, thoracic spine 05/26/2016  . Myelitis (HCC)   . Right leg weakness   . Urinary retention   . AKI (acute kidney injury) (HCC)   . Aneurysm (HCC)   . CKD (chronic kidney disease), stage III     Expected Discharge Date: Expected Discharge Date: 07/07/16  Team Members Present: Physician leading conference: Dr. Faith RogueZachary Swartz Social Worker Present: Amada JupiterLucy Lugenia Assefa, LCSW Nurse Present: Chana Bodeeborah Sharp, RN PT Present: Alyson ReedyElizabeth Tygielski, Grayland OrmondPT;Alison Gray, PT OT Present: Roney MansJennifer Smith, OT PPS Coordinator present : Tora DuckMarie Noel, RN, CRRN     Current Status/Progress Goal Weekly Team Focus  Medical   increasing lower extermity spasms. likely myelitis. neuro involved, UTI  improve functional mobility, define neuro plan of care  improve spasms, treat UTI, interfacing with neurology re: plan   Bowel/Bladder   Bowel program q6am. Continent on toliet. I&O cath q6hrs. Pt learning self cath  manage bowel and bladder program minimal assist  contiune  education on b/b programs   Swallow/Nutrition/ Hydration             ADL's   supervision sliding board; min A with LB self care bed level and mod A from w/c level; mod A toileting for clothing management  Supervision- min A  ADL retraining with Adaptive techniques, d/c planning, functional mobility   Mobility   S/minA bed mobility, S transfers, modI w/c management  modI w/c <>bed, S car transfers, modI w/c management, d/c'ed gait goals  transfer training, bed mobility, dynamic sitting balance   Communication             Safety/Cognition/ Behavioral Observations            Pain   pain to back and BLE spasms. PRN vicoden 1-2 tabs  pain less than or equal to 3  assess pain and medicate as needed   Skin   skin CDI.   no skin breadkdown min assist  assess skin qshift    Rehab Goals Patient on target to meet rehab goals: No Rehab Goals Revised: most goals downgraded *See Care Plan and progress notes for long and short-term goals.  Barriers to Discharge: spasticity, lower extremity weakness    Possible Resolutions to Barriers:  rx above, treat UTI, adaptive techniques including sliding board for transfer    Discharge Planning/Teaching Needs:  Pt to d/c home with grandmother who can provide supervision.  Note that pt is hesistant about going to her home and  placing a "burden"  on her, however, this remains the plan.  Need to schedule education with family.   Team Discussion:  IR work up completed.  Likely myelitis but await further direction from neurology.  Continue to address spasticity and tone. Overall supervision with slide board transfers.  Bed level ADLs.  Pt not fully appreciating his longer term prognosis.  PT gave SCI backpack.  Self-cathed today and working on bowel program.  Mod ind/ supervision w/c level goals.  MD to continue to work with neurology.  Revisions to Treatment Plan:  Goals revised for all w/c level    Continued Need for Acute Rehabilitation Level of Care:  The patient requires daily medical management by a physician with specialized training in physical medicine and rehabilitation for the following conditions: Daily direction of a multidisciplinary physical rehabilitation program to ensure safe treatment while eliciting the highest outcome that is of practical value to the patient.: Yes Daily medical management of patient stability for increased activity during participation in an intensive rehabilitation regime.: Yes Daily analysis of laboratory values and/or radiology reports with any subsequent need for medication adjustment of medical intervention for : Neurological problems;Urological problems  Perry Morrison 07/02/2016, 3:16 PM

## 2016-07-02 NOTE — Progress Notes (Signed)
Social Work Patient ID: Perry Morrison, male   DOB: 12/29/1960, 56 y.o.   MRN: [id number]  Met with pt yesterday afternoon to review team conference.  Explained that new targeted d/c date is 2/27 with supervision/ mod ind w/c level goals.  Pt reports that he is still wanting to transfer to [facility] for a 2nd opinion and "I can do more therapy there before I put it all on my grandmother."  Explained to pt that transfer to [facility]' CIR is not an option as he is already receiving that level of care here.  He then counters that he wants to transfer to medical service to "have more work up..".    I have spoken with Dr. Swartz and Dan Angiulli, PA about pt's wishes.  PA to follow up with our neurology service to check status of whether a transfer is an option and, if not, ask them to discuss further with pt so I can proceed with d/c planning arrangements.  Continue to follow.  , , LCSW 

## 2016-07-02 NOTE — Progress Notes (Signed)
Physical Therapy Note  Patient Details  Name: Aurea GraffJamie Venus MRN: [id number] Date of Birth: 02/21/1961 Today's Date: 07/02/2016    Patient missed 30 min secondary to increased fatigue from previous sessions and procedure done earlier. Will attempt to reschedule when patient is able to participate.    Merri RayWISHART,Beulah Capobianco J 07/02/2016, 5:10 PM

## 2016-07-02 NOTE — Progress Notes (Signed)
Pt demonstrated proper clean technique of I&O cath to RN with minimal assistance. RN will continue to educate pt on self cathing.

## 2016-07-03 ENCOUNTER — Inpatient Hospital Stay (HOSPITAL_COMMUNITY): Payer: Medicaid Other | Admitting: Physical Therapy

## 2016-07-03 ENCOUNTER — Inpatient Hospital Stay (HOSPITAL_COMMUNITY): Payer: Medicaid Other | Admitting: Occupational Therapy

## 2016-07-03 ENCOUNTER — Inpatient Hospital Stay (HOSPITAL_COMMUNITY): Payer: Self-pay | Admitting: Physical Therapy

## 2016-07-03 LAB — ZINC: Zinc: 97 ug/dL (ref 56–134)

## 2016-07-03 LAB — OLIGOCLONAL BANDS, CSF + SERM

## 2016-07-03 LAB — VARICELLA-ZOSTER BY PCR: Varicella-Zoster, PCR: NEGATIVE

## 2016-07-03 LAB — COPPER, SERUM: Copper: 148 ug/dL (ref 72–166)

## 2016-07-03 NOTE — Progress Notes (Signed)
PHYSICAL MEDICINE & REHABILITATION     PROGRESS NOTE    Subjective/Complaints:  Up in bathroom. No new complaints. Low back still stiff at LP site but better than yesterday  ROS: pt denies nausea, vomiting, diarrhea, cough, shortness of breath or chest pain     Objective: Vital Signs: Blood pressure 114/65, pulse 94, temperature 98.1 F (36.7 C), temperature source Oral, resp. rate 18, height 6\' 1"  (1.854 m), weight 97 kg (213 lb 12.8 oz), SpO2 100 %. No results found. No results for input(s): WBC, HGB, HCT, PLT in the last 72 hours. No results for input(s): NA, K, CL, GLUCOSE, BUN, CREATININE, CALCIUM in the last 72 hours.  Invalid input(s): CO CBG (last 3)  No results for input(s): GLUCAP in the last 72 hours.  Wt Readings from Last 3 Encounters:  06/24/16 97 kg (213 lb 12.8 oz)  05/26/16 102.1 kg (225 lb)    Physical Exam:  Gen: NAD. Marland Kitchen. HENT: Normocephalic and atraumatic.  Eyes: EOM are normal. No discharge.  Cardiovascular:RRR Respiratory:clear bilaterally.  GI: ND,NT  Musculoskeletal: low back sore. LP site normal in appearance   Neurological:  He is alert and oriented.  B/l UE 5/5.  RLE: 0/5 HF to distal--stable  LLE: 1+/5 HF, 2- to 2/5 KE and 2/5 ADF/PF-- no change - extensor tone in LE's especially left side-- remains tr-1/4---Substantial improvement overall.   DTR's 3+  Decreased sensation below level of injury, right leg more affected than left--stable Skin: Skin remains warm and dry. Intact.  Psych: remains pleasant  Assessment/Plan: 1. Functional and mobility deficits secondary to thoracic myelopathy which require 3+ hours per day of interdisciplinary therapy in a comprehensive inpatient rehab setting. Physiatrist is providing close team supervision and 24 hour management of active medical problems listed below. Physiatrist and rehab team continue to assess barriers to discharge/monitor patient progress toward functional and medical  goals.  Function:  Bathing Bathing position   Position: Other (comment) (sitting on tub bench, tub/shower)  Bathing parts Body parts bathed by patient: Right arm, Left arm, Chest, Front perineal area, Abdomen, Buttocks, Right upper leg, Left upper leg, Right lower leg, Left lower leg Body parts bathed by helper: Back  Bathing assist Assist Level: Touching or steadying assistance(Pt > 75%)   Set up : To obtain items, To adjust water temperature  Upper Body Dressing/Undressing Upper body dressing   What is the patient wearing?: Pull over shirt/dress     Pull over shirt/dress - Perfomed by patient: Thread/unthread right sleeve, Thread/unthread left sleeve, Put head through opening, Pull shirt over trunk          Upper body assist Assist Level: Set up   Set up : To obtain clothing/put away  Lower Body Dressing/Undressing Lower body dressing   What is the patient wearing?: Pants Underwear - Performed by patient: Thread/unthread left underwear leg, Pull underwear up/down Underwear - Performed by helper: Thread/unthread right underwear leg, Thread/unthread left underwear leg, Pull underwear up/down Pants- Performed by patient: Thread/unthread right pants leg, Thread/unthread left pants leg, Pull pants up/down Pants- Performed by helper: Pull pants up/down Non-skid slipper socks- Performed by patient: Don/doff right sock, Don/doff left sock Non-skid slipper socks- Performed by helper: Don/doff right sock, Don/doff left sock     Shoes - Performed by patient: Don/doff right shoe, Don/doff left shoe, Fasten right Shoes - Performed by helper: Fasten left (assist with placement of LLE and stabilize leg on opposite knee while pt tied shoe) AFO - Performed by  patient: Don/doff right AFO     TED Hose - Performed by helper: Don/doff right TED hose, Don/doff left TED hose  Lower body assist Assist for lower body dressing: Touching or steadying assistance (Pt > 75%)       Toileting Toileting Toileting activity did not occur: No continent bowel/bladder event Toileting steps completed by patient: Performs perineal hygiene Toileting steps completed by helper: Adjust clothing prior to toileting, Adjust clothing after toileting Toileting Assistive Devices: Other (comment)  Toileting assist Assist level: Two helpers (steady)   Transfers Chair/bed transfer Chair/bed transfer activity did not occur: N/A Chair/bed transfer method: Lateral scoot Chair/bed transfer assist level: Supervision or verbal cues Chair/bed transfer assistive device: Armrests, Sliding board     Locomotion Ambulation     Max distance: 5 Assist level: 2 helpers   Wheelchair   Type: Manual Max wheelchair distance: 200 Assist Level: No help, No cues, assistive device, takes more than reasonable amount of time  Cognition Comprehension Comprehension assist level: Follows complex conversation/direction with no assist  Expression Expression assist level: Expresses complex ideas: With no assist  Social Interaction Social Interaction assist level: Interacts appropriately with others with medication or extra time (anti-anxiety, antidepressant).  Problem Solving Problem solving assist level: Solves complex problems: Recognizes & self-corrects  Memory Memory assist level: Complete Independence: No helper   Medical Problem List and Plan: 1.  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   -MRI 2/2 reviewed showing increased in signal intensity throughout thoracic spine    -continue CIR therapies  -appreciate Dr. Catha Brow consult and team follow up/recommendations  -outpt follow up with Dr. Epimenio Foot as outpt    2.  DVT Prophylaxis/Anticoagulation: SCDs. Venous doppler study negative  -resumed lovenox   3. Pain Management: Hydrocodone as needed  -baclofen for spasms-  10mg  qid--continue at current dose  - klonopin--increased to  0.5mg  TID--with improvement in tone.  -rxing UTI 4. Hypertension. Norvasc 10 mg daily Monitor when out of bed .    5. Neuropsych: This patient is capable of making decisions on his own behalf.  -provide ego support as needed  -pt with some anxiety related to his clinical condition 6. Skin/Wound Care: Routine skin checks 7. Fluids/Electrolytes/Nutrition:encourage PO 8.CRI stage III. Follow-up chemistries next week after angio 9. Neurogenic bowel/ bladder.  -continue I/O caths   -pt able to perform self-caths   -klebsiella UTI--  keflex 250mg  QID through 2/26  - daily bowel program appears effective.          [name], MD, [facility] Physical Medicine & Rehabilitation          LOS (Days) 32 A FACE TO FACE EVALUATION WAS PERFORMED  [name], MD 07/03/2016 8:52 AM

## 2016-07-03 NOTE — Progress Notes (Signed)
Occupational Therapy Session Note  Patient Details  Name: Perry Morrison [name]: [id number] Date of Birth: 09/21/1960  Today's Date: 07/03/2016 OT Individual Time: [id number] OT Individual Time Calculation (min): 64 min   Skilled Therapeutic Interventions/Progress Updates:  Pt was lying in bed at time of arrival, agreeable to session. Tx focus on activity tolerance, functional transfers, and adaptive bathing/dressing skills, Supine<sit completed with close supervision and extra time with use of leg lifter. Pt reported having pain but declined having pain meds. Slideboard transfer<EOB<w/c<tub bench completed with supervision-Min A for safe positioning of R LE. IV site covered. Once on tub bench, pt completed bathing with use of LH sponge, using lateral leans for pericare. Pt required multiple rest breaks due to fatigue, often sighing. Pt able to complete mini squat stands for drying buttocks and applying brief. Slideboard transfer completed back to chair with pillowcase over board. Teds donned with Total A and pt required Mod A for lotion application for LEs. Pt left via PT handoff for finishing dressing.      Therapy Documentation Precautions:  Precautions Precautions: Fall Precaution Comments: RLE hemiparesis Other Brace/Splint: PRAFO R foot Restrictions Weight Bearing Restrictions: No   Pain: Pt reported pain to be manageable with provided rest breaks         See Function Navigator for Current Functional Status.   Therapy/Group: Individual Therapy  Perry Morrison 07/03/2016, 12:28 PM

## 2016-07-03 NOTE — Progress Notes (Signed)
Occupational Therapy Session Note  Patient Details  Name: Perry Morrison MRN: [id number] Date of Birth: 1960/10/05  Today's Date: 07/03/2016 OT Individual Time: 1030-1130 OT Individual Time Calculation (min): 60 min    Short Term Goals: Week 4:  OT Short Term Goal 1 (Week 4): STGs = LTGs  Skilled Therapeutic Interventions/Progress Updates:    Pt seen this session to practice transfers without slide board and using a squat pivot based on pt's desire to force his legs to do more work. Close S with squat pivot:  Recliner to w/c W/c >< tub bench in ADL apt (then needed A to lift legs over tub wall) W/c to bed  After tub transfer, pt used UBE (arm bike) for 12 min for cardio conditioning and to loosen his upper back muscles.  At end of session, pt in bed with all needs met.  Therapy Documentation Precautions:  Precautions Precautions: Fall Precaution Comments: RLE hemiparesis Other Brace/Splint: PRAFO R foot Restrictions Weight Bearing Restrictions: No  Pain: pt stated he continues to feel stiffness and pain in low back, he has been using heat.   ADL:  See Function Navigator for Current Functional Status.   Therapy/Group: Individual Therapy  SAGUIER,JULIA 07/03/2016, 12:08 PM

## 2016-07-03 NOTE — Progress Notes (Addendum)
Social Work Patient ID: Perry Morrison, male   DOB: 07-01-1960, 56 y.o.   MRN: [id number]   Met with pt this morning to review d/c plan as, per Perry Shores, PA, pt should be aware that plan is for him to d/c home after earlier discussion with neurology service.  Pt continues to report to this SW that "they said I need a new drug and that I could go to another place to get that  and keep getting therapy."  I then followed up with PA who reports that neurology PA did suggest a possible SNF to pt.  Have now explained to PA and Dr. [name] that discussion of SNF was had with pt on 2/1 (see SW progress note) about the reality of what SNF would be given that he is uninsured:  Will not receive therapy and will have to search for bed state-wide.  At that time pt decided he would coordinate with his grandmother about going to her home.   Dr. [name] is contacting neurology services to ask them to clarify with pt the information provided to him.    Pt needs to be given a clear report of his options and that, per MD, does not seem to include a transfer to either another hospital or CIR.  Will continue to follow.   Perry Sheley, LCSW   Have followed up with neurology PA and reports that pt does not seem to be understanding all of the information provided to him and that they have discussed plans several times.  Perry Shores, PA did follow up with pt and stressed to him that NO transfer to [name] or [name] will be taking place.  Clarified with him about longer term follow up and possible treatment plans.  He also clarified that SNF is the only possible option.  I have also spoken again with pt and he understands that, unless he wishes to pursue SNF, then he will be discharging home with his grandmother on Tues 2/27 and is agreeable to this.  Perry Matsuo, LCSW

## 2016-07-03 NOTE — Progress Notes (Signed)
Patient verbalize Iincreased discomfort with stiffness and tenderness to LP site., will notify next shift  RN

## 2016-07-03 NOTE — Progress Notes (Signed)
Physical Therapy Session Note  Patient Details  Name: Perry Morrison MRN: [id number] Date of Birth: 04/21/1961  Today's Date: 07/03/2016 PT Individual Time: 0900-1000 PT Individual Time Calculation (min): 60 min   Short Term Goals: Week 4:  PT Short Term Goal 1 (Week 4): =LTG due to estimated LOS  Skilled Therapeutic Interventions/Progress Updates: Pt received seated in w/c in bathroom with handoff from OT; denies pain and agreeable to treatment. Pt threads BLEs with leg lifter and reacher; attempted to pull up pants through lateral leans with cues to use bed on side to increase ability to lean off hip, however still unable to pull up. Ultimately therapist pulled up pants totalA. W/c propulsion modI x200'. Sit <>stand x4 reps with L hall rail and RUE on arm rest, maxA. Static standing x20-30 sec per trial; cues for upright posture, LLE extension, focus on breathing. Transfer w/c >recliner with lateral scoot with S. Remained seated in recliner at end of session, all needs in reach.      Therapy Documentation Precautions:  Precautions Precautions: Fall Precaution Comments: RLE hemiparesis Other Brace/Splint: PRAFO R foot Restrictions Weight Bearing Restrictions: No   See Function Navigator for Current Functional Status.   Therapy/Group: Individual Therapy  [name] 07/03/2016, 12:55 PM

## 2016-07-03 NOTE — Progress Notes (Signed)
Physical Therapy Session Note  Patient Details  Name: Perry Morrison MRN: [id number] Date of Birth: 07/03/1960  Today's Date: 07/03/2016 PT Individual Time: 1550-1615 PT Individual Time Calculation (min): 25 min   Short Term Goals: Week 4:  PT Short Term Goal 1 (Week 4): =LTG due to estimated LOS  Skilled Therapeutic Interventions/Progress Updates:  Pt was lying supine in bed upon arrival and was willing to participate in therapy. Pt's neighbor was present in the room and observed therapy. Pt requested to perform supine exercises to help with spasms in RLE. Therapy consisted supine PROM and prolong stretching exercises to improve tone,  increase ROM, and reduce spasms. Exercises included 1 x 20 reps of PROM combination of hip flexion/extension and knee flexion/extension with pause at end range for BLE; 1 x 20 reps of AAROM combination of hip flexion/extension and knee flexion/extension on LLE; 4 x 30 sec hold stretches for RLE hamstrings, RLE hip ER/IR, and R heel cord.  Pt was left in bed with all needs within reach.       Therapy Documentation Precautions:  Precautions Precautions: Fall Precaution Comments: RLE hemiparesis Other Brace/Splint: PRAFO R foot Restrictions Weight Bearing Restrictions: No   See Function Navigator for Current Functional Status.   Therapy/Group: Individual Therapy  Perry Morrison 07/03/2016, 5:09 PM

## 2016-07-04 ENCOUNTER — Inpatient Hospital Stay (HOSPITAL_COMMUNITY): Payer: Self-pay | Admitting: Occupational Therapy

## 2016-07-04 DIAGNOSIS — I10 Essential (primary) hypertension: Secondary | ICD-10-CM

## 2016-07-04 NOTE — Progress Notes (Signed)
Perry Morrison is a 56 y.o. male 06/29/1960 [id number]  Subjective: No new complaints. No new problems. Slept well. Feeling OK. Legs still "doing their own thing" and feels anxious to walk indep once controlled  Objective: Vital signs in last 24 hours: Temp:  [98 F (36.7 C)] 98 F (36.7 C) (02/24 0455) Pulse Rate:  [89-91] 91 (02/24 0455) Resp:  [17-20] 20 (02/24 0455) BP: (119-140)/(68-74) 119/74 (02/24 0455) SpO2:  [98 %-100 %] 98 % (02/24 0455) Weight change:  Last BM Date: 07/04/16  Intake/Output from previous day: 02/23 0701 - 02/24 0700 In: 720 [P.O.:720] Out: 2050 [Urine:2050]  Physical Exam General: No apparent distress   In bed with periodic myoclonus of LLE at my visit Lungs: Normal effort. Lungs clear to auscultation, no crackles or wheezes. Cardiovascular: Regular rate and rhythm, no edema Neurological: No new neurological deficits   Lab Results: BMET    Component Value Date/Time   NA 136 06/26/2016 0507   K 4.1 06/26/2016 0507   CL 98 (L) 06/26/2016 0507   CO2 28 06/26/2016 0507   GLUCOSE 107 (H) 06/26/2016 0507   BUN 15 06/26/2016 0507   CREATININE 1.00 06/26/2016 0507   CALCIUM 9.4 06/26/2016 0507   GFRNONAA >60 06/26/2016 0507   GFRAA >60 06/26/2016 0507   CBC    Component Value Date/Time   WBC 5.5 06/22/2016 1430   RBC 4.23 06/22/2016 1430   HGB 12.7 (L) 06/22/2016 1430   HCT 36.9 (L) 06/22/2016 1430   PLT 345 06/22/2016 1430   MCV 87.2 06/22/2016 1430   MCH 30.0 06/22/2016 1430   MCHC 34.4 06/22/2016 1430   RDW 13.1 06/22/2016 1430   LYMPHSABS 1.7 06/16/2016 0731   MONOABS 0.5 06/16/2016 0731   EOSABS 0.3 06/16/2016 0731   BASOSABS 0.0 06/16/2016 0731   CBG's (last 3):  No results for input(s): GLUCAP in the last 72 hours. LFT's Lab Results  Component Value Date   ALT 170 (H) 06/16/2016   AST 60 (H) 06/16/2016   ALKPHOS 91 06/16/2016   BILITOT 1.0 06/16/2016    Studies/Results: No results found.  Medications:  I have  reviewed the patient's current medications. Scheduled Medications: . amLODipine  10 mg Oral Daily  . baclofen  10 mg Oral QID  . bisacodyl  10 mg Rectal Q0600  . cephALEXin  250 mg Oral QID  . clonazePAM  0.5 mg Oral TID  . hydrocortisone  25 mg Rectal Daily  . pantoprazole  40 mg Oral Daily  . senna-docusate  2 tablet Oral BID  . valACYclovir  500 mg Oral BID   PRN Medications: acetaminophen **OR** acetaminophen, ALPRAZolam, HYDROcodone-acetaminophen, lactulose, methocarbamol, ondansetron **OR** ondansetron (ZOFRAN) IV, simethicone, sorbitol  Assessment/Plan: Principal Problem:   Incomplete paraplegia (HCC) Active Problems:   CKD (chronic kidney disease), stage III   Neurogenic bowel   Neurogenic bladder   Thoracic myelopathy   Benign essential HTN   Transverse myelitis (HCC)   Length of stay, days: 33  Continue CIR therapies and supportive care: muscle relaxer prn Continue med mgmt of chronic conditions as ongoing  Perry Scouten A. Felicity CoyerLeschber, MD 07/04/2016, 7:49 AM

## 2016-07-05 ENCOUNTER — Inpatient Hospital Stay (HOSPITAL_COMMUNITY): Payer: Medicaid Other | Admitting: Physical Therapy

## 2016-07-05 DIAGNOSIS — R200 Anesthesia of skin: Secondary | ICD-10-CM

## 2016-07-05 NOTE — Progress Notes (Signed)
Aurea GraffJamie Depuy is a 56 y.o. male 12/13/1960 [id number]  Subjective: No new complaints. No new problems. Slept well. Feeling OK.  Objective: Vital signs in last 24 hours: Temp:  [98.1 F (36.7 C)-98.4 F (36.9 C)] 98.4 F (36.9 C) (02/25 0429) Pulse Rate:  [80-85] 80 (02/25 0429) Resp:  [18] 18 (02/25 0429) BP: (118-125)/(60-69) 118/69 (02/25 0429) SpO2:  [98 %-99 %] 99 % (02/25 0429) Weight change:  Last BM Date: 07/04/16  Intake/Output from previous day: 02/24 0701 - 02/25 0700 In: 720 [P.O.:720] Out: 2755 [Urine:2755]  Physical Exam General: No apparent distress    Lungs: Normal effort. Lungs clear to auscultation, no crackles or wheezes. Cardiovascular: Regular rate and rhythm, no edema Neurological: No new neurological deficits - continued BLE spastic myoclonus   Lab Results: BMET    Component Value Date/Time   NA 136 06/26/2016 0507   K 4.1 06/26/2016 0507   CL 98 (L) 06/26/2016 0507   CO2 28 06/26/2016 0507   GLUCOSE 107 (H) 06/26/2016 0507   BUN 15 06/26/2016 0507   CREATININE 1.00 06/26/2016 0507   CALCIUM 9.4 06/26/2016 0507   GFRNONAA >60 06/26/2016 0507   GFRAA >60 06/26/2016 0507   CBC    Component Value Date/Time   WBC 5.5 06/22/2016 1430   RBC 4.23 06/22/2016 1430   HGB 12.7 (L) 06/22/2016 1430   HCT 36.9 (L) 06/22/2016 1430   PLT 345 06/22/2016 1430   MCV 87.2 06/22/2016 1430   MCH 30.0 06/22/2016 1430   MCHC 34.4 06/22/2016 1430   RDW 13.1 06/22/2016 1430   LYMPHSABS 1.7 06/16/2016 0731   MONOABS 0.5 06/16/2016 0731   EOSABS 0.3 06/16/2016 0731   BASOSABS 0.0 06/16/2016 0731   CBG's (last 3):  No results for input(s): GLUCAP in the last 72 hours. LFT's Lab Results  Component Value Date   ALT 170 (H) 06/16/2016   AST 60 (H) 06/16/2016   ALKPHOS 91 06/16/2016   BILITOT 1.0 06/16/2016    Studies/Results: No results found.  Medications:  I have reviewed the patient's current medications. Scheduled Medications: . amLODipine  10  mg Oral Daily  . baclofen  10 mg Oral QID  . bisacodyl  10 mg Rectal Q0600  . cephALEXin  250 mg Oral QID  . clonazePAM  0.5 mg Oral TID  . hydrocortisone  25 mg Rectal Daily  . pantoprazole  40 mg Oral Daily  . senna-docusate  2 tablet Oral BID  . valACYclovir  500 mg Oral BID   PRN Medications: acetaminophen **OR** acetaminophen, ALPRAZolam, HYDROcodone-acetaminophen, lactulose, methocarbamol, ondansetron **OR** ondansetron (ZOFRAN) IV, simethicone, sorbitol  Assessment/Plan: Principal Problem:   Incomplete paraplegia (HCC) Active Problems:   CKD (chronic kidney disease), stage III   Neurogenic bowel   Neurogenic bladder   Thoracic myelopathy   Benign essential HTN   Transverse myelitis (HCC)   Length of stay, days: 34  Continue IP rehab and therapies for supportive care as outlined Continue medical therapies for chronic conditions as listed above, no changes recommended   Nisaiah Bechtol A. Felicity CoyerLeschber, MD 07/05/2016, 9:31 AM

## 2016-07-05 NOTE — Progress Notes (Signed)
Occupational Therapy Session Note  Patient Details  Name: Perry GraffJamie Kreamer MRN: [id number] Date of Birth: 05/14/1960  Today's Date: 07/04/2016 OT Individual Time:  -  13:20-14:05 total 45 minutes individual OT time    Skilled Therapeutic Interventions/Progress Updates: patient stated he was fatigued and groggy and was too tired for UB strengthenin but asked for this clinician to stretch his toneous, spasming lower extremities.    He participated in L LE ROM and PROM and R LE PROM.   He brother and nephew from [place] visited and chatted with him during the session.  He was left in his reecliner with call bell in place and his attentive brother and nephew  Present.     Therapy Documentation Precautions:  Precautions Precautions: Fall Precaution Comments: RLE hemiparesis Other Brace/Splint: PRAFO R foot Restrictions Weight Bearing Restrictions: No  Pain: denied except with "spams" and RN had given meds just prior to session   See Function Navigator for Current Functional Status.   Therapy/Group: Individual Therapy  [name], Viviana Trimble Cgh Medical CenterYeary 07/05/2016, 5:55 PM

## 2016-07-05 NOTE — Progress Notes (Signed)
Physical Therapy Session Note  Patient Details  Name: Perry Morrison MRN: [id number] Date of Birth: 01/19/1961  Today's Date: 07/05/2016 PT Individual Time: [id number] and 1310-1326 PT Individual Time Calculation (min): 29 min and 16 min  Short Term Goals: Week 4:  PT Short Term Goal 1 (Week 4): =LTG due to estimated LOS  Skilled Therapeutic Interventions/Progress Updates:   Session 1: Pt was lying supine in bed and was willing to participate in therapy. Pt was complaining of lumbar stiffness and discomfort due to lumbar puncture several days ago. Pt performed 4 alternating bed rolls to L and R with Min A to help with donning briefs and slide up pants. Ted hose and non-slip socks were donned dependently in bed. Pt was recently given suppository so therapy remained in pt's room. Pt required mod A to transfer to EOB with verbal cues for handplacement and sequence. Pt attempted to perform 2 sit-to-stand transfers from EOB max A with manual cues for stabilizing RLE for NMR for BLE. Pt performed 1 sit-to-stand in [place] and performed 10 mini-squats supervision level before being transferred to the elevated commode. Pt was supervision to transfer from stedy onto commode, but dependent for doffing pants/briefs and set up for perineal hygiene. Pt was left sitting on commode with instructions to call nursing when finished.  Session 2: Pt was sitting in recliner upon arrival. Pt required min A to scoot to each of recliner after repositioning recliner to its upright position. Pt performed 10 prolong mini-squats with BUE support and therapist positioned in front on recliner armrests to promote increased weight bearing through BLE. Pt required mod A to maintain position for average of 20 seconds for each attempt. Pt was left sitting in recliner with head tilted back and with all needs within reach.      Therapy Documentation Precautions:  Precautions Precautions: Fall Precaution Comments: RLE  hemiparesis Other Brace/Splint: PRAFO R foot Restrictions Weight Bearing Restrictions: No   See Function Navigator for Current Functional Status.   Therapy/Group: Individual Therapy  Rudie MeyerJeffrey Harvard Zeiss 07/05/2016, 8:46 AM

## 2016-07-06 ENCOUNTER — Inpatient Hospital Stay (HOSPITAL_COMMUNITY): Payer: Self-pay | Admitting: Physical Therapy

## 2016-07-06 ENCOUNTER — Inpatient Hospital Stay (HOSPITAL_COMMUNITY): Payer: Self-pay | Admitting: Occupational Therapy

## 2016-07-06 LAB — MISC LABCORP TEST (SEND OUT)
LABCORP TEST CODE: 828731
LABCORP TEST CODE: 828733

## 2016-07-06 MED ORDER — POLYETHYLENE GLYCOL 3350 17 G PO PACK
17.0000 g | PACK | Freq: Every day | ORAL | Status: DC
  Administered 2016-07-06 – 2016-07-09 (×4): 17 g via ORAL
  Filled 2016-07-06 (×4): qty 1

## 2016-07-06 MED ORDER — POLYETHYLENE GLYCOL 3350 17 G PO PACK: 17.0000 g | PACK | Freq: Every day | ORAL | Status: DC

## 2016-07-06 NOTE — Progress Notes (Signed)
Subjective: No changes  Exam: Vitals:   07/06/16 0349 07/06/16 1250  BP: 116/73 137/85  Pulse: 77 87  Resp: 18 17  Temp: 98.4 F (36.9 C)    Gen: In bed, NAD Resp: non-labored breathing, no acute distress Abd: soft, nt  Neuro: MS: awake, alert, ZO:XWRUECN:PERRL, VFF Motor: he has plegia of the right LE, his LLE as significant hip flexion weakness, but stronger distally.   Labs HSV and VZV are negative, IgG index normal. He continues to have an elevated protein which I don't think is very helpful etiologically. Zinc and copper are normal.   PAVAL sent to [facility], typically with around two week turnaround.   Impression: I agree with Dr. 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 I had been concerned for possible vascular causes, but no evidence of this on spinal arteriogram. With stable exam, I would not pursue IVIG or other treatment at this time.  Recommendations: 1) Will follow up PAVAL panel.  2) No other treatment at this time. If he were to have recurrance or testing on PAVAL were to indicate a clear etiology, then would consider IVIG or PLEX, but at this point I think this is unlikely to be of any benefit.   Ritta SlotMcNeill Deshannon Hinchliffe, MD [facility] [phone]  If 7pm- 7am, please page neurology on call as listed in AMION.

## 2016-07-06 NOTE — [place] FL2 (Signed)
[place] MEDICAID FL2 LEVEL OF CARE SCREENING TOOL     IDENTIFICATION  Patient Name: Perry GraffJamie Morrison Birthdate: 03/25/1961 Sex: male Admission Date (Current Location): 06/01/2016  [facility] [place]  [place] pending Facility and Address:  [facility], [address], [place], [place] [address]      Provider Number: [id number]  Attending Physician Name and Address:  Ranelle OysterZachary T Swartz, MD  Relative Name and Phone Number:       Current Level of Care: Other (Comment) (Acute Inpatient Rehabilitation) Recommended Level of Care: Skilled Nursing Facility Prior Approval Number:    Date Approved/Denied:   PASRR Number: [id number]  Discharge Plan: SNF    Current Diagnoses: Patient Active Problem List   Diagnosis Date Noted  . Transverse myelitis (HCC)   . Benign essential HTN   . 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 Weakness of right leg   . Neurogenic bowel   . Neurogenic bladder   . Incomplete paraplegia (HCC)   . GI bleed 05/30/2016  . Hypertension 05/26/2016  . Constipation 05/26/2016  . Abnormal MRI, thoracic spine 05/26/2016  . Myelitis (HCC)   . Right leg weakness   . Urinary retention   . AKI (acute kidney injury) (HCC)   . Aneurysm (HCC)   . CKD (chronic kidney disease), stage III     Orientation RESPIRATION BLADDER Height & Weight     Self, Time, Situation, Place  Normal  (in & out cath q 4-6 hours) Weight: 97 kg (213 lb 12.8 oz) Height:  6\' 1"  (185.4 cm)  BEHAVIORAL SYMPTOMS/MOOD NEUROLOGICAL BOWEL NUTRITION STATUS      Continent (with bowel program/ suppository placement) Diet (Regular)  AMBULATORY STATUS COMMUNICATION OF NEEDS Skin   Total Care (w/c level for all mobility) Verbally Normal                       Personal Care Assistance Level of Assistance  Bathing, Dressing Bathing Assistance: Limited assistance   Dressing Assistance: Limited assistance     Functional Limitations Info              SPECIAL CARE FACTORS FREQUENCY  PT (By licensed PT), OT (By licensed OT), Bowel and bladder program     PT Frequency: 5x/wk OT Frequency: 5x/wk Bowel and Bladder Program Frequency: I/O cath q 4-6 hours;  suppository          Contractures Contractures Info: Not present    Additional Factors Info  Code Status, Allergies, Psychotropic Code Status Info: Full Allergies Info: NKDA Psychotropic Info: klonipin         Current Medications (07/06/2016):  This is the current hospital active medication list Current Facility-Administered Medications  Medication Dose Route Frequency Provider Last Rate Last Dose  . acetaminophen (TYLENOL) tablet 650 mg  650 mg Oral Q6H PRN Mcarthur Rossettianiel J Angiulli, PA-C   650 mg at 06/21/16 0915   Or  . acetaminophen (TYLENOL) suppository 650 mg  650 mg Rectal Q6H PRN Mcarthur Rossettianiel J Angiulli, PA-C      . ALPRAZolam Prudy Feeler(XANAX) tablet 0.5 mg  0.5 mg Oral TID PRN Ranelle OysterZachary T Swartz, MD   0.5 mg at 06/27/16 0012  . amLODipine (NORVASC) tablet 10 mg  10 mg Oral Daily Mcarthur RossettiDaniel J Angiulli, PA-C   10 mg at 07/06/16 0912  . baclofen (LIORESAL) tablet 10 mg  10 mg Oral QID Ranelle OysterZachary T Swartz, MD   10 mg at 07/06/16 0913  . bisacodyl (DULCOLAX) suppository 10  mg  10 mg Rectal Q0600 [name], MD   10 mg at 07/06/16 1610  . cephALEXin (KEFLEX) capsule 250 mg  250 mg Oral QID [name], MD   250 mg at 07/06/16 0911  . clonazePAM (KLONOPIN) tablet 0.5 mg  0.5 mg Oral TID [name], MD   0.5 mg at 07/06/16 0912  . HYDROcodone-acetaminophen (NORCO/VICODIN) 5-325 MG per tablet 1-2 tablet  1-2 tablet Oral Q4H PRN Mcarthur Rossetti Angiulli, PA-C   1 tablet at 07/02/16 1230  . hydrocortisone (ANUSOL-HC) suppository 25 mg  25 mg Rectal Daily Mcarthur Rossetti Angiulli, PA-C   25 mg at 07/03/16 1434  . lactulose (CHRONULAC) 10 GM/15ML solution 30 g  30 g Oral BID PRN Mcarthur Rossetti Angiulli, PA-C   30 g at 07/04/16 0100  . methocarbamol (ROBAXIN) tablet 500 mg  500 mg Oral Q6H PRN [name], MD    500 mg at 07/06/16 9604  . ondansetron (ZOFRAN) tablet 4 mg  4 mg Oral Q6H PRN Mcarthur Rossetti Angiulli, PA-C       Or  . ondansetron (ZOFRAN) injection 4 mg  4 mg Intravenous Q6H PRN Daniel J Angiulli, PA-C      . pantoprazole (PROTONIX) EC tablet 40 mg  40 mg Oral Daily Mcarthur Rossetti Angiulli, PA-C   40 mg at 07/06/16 0913  . senna-docusate (Senokot-S) tablet 2 tablet  2 tablet Oral BID [name], MD   2 tablet at 07/06/16 0912  . simethicone (MYLICON) chewable tablet 80 mg  80 mg Oral Q6H PRN Ankit Karis Juba, MD   80 mg at 07/04/16 0100  . sorbitol 70 % solution 30 mL  30 mL Oral Daily PRN Mcarthur Rossetti Angiulli, PA-C   30 mL at 06/25/16 2013  . valACYclovir (VALTREX) tablet 500 mg  500 mg Oral BID Mcarthur Rossetti Angiulli, PA-C   500 mg at 07/06/16 5409     Discharge Medications: Please see discharge summary for a list of discharge medications.  Relevant Imaging Results:  Relevant Lab Results:   Additional Information SS#  [id number]  Amada Jupiter, LCSW

## 2016-07-06 NOTE — Progress Notes (Signed)
Physical Therapy Session Note  Patient Details  Name: Perry Morrison MRN: [id number] Date of Birth: 11/05/1960  Today's Date: 07/06/2016 PT Individual Time: 0800-0900 and 1400-1500 PT Individual Time Calculation (min): 60 min and 60 min (total 120 min)   Short Term Goals: Week 4:  PT Short Term Goal 1 (Week 4): =LTG due to estimated LOS  Skilled Therapeutic Interventions/Progress Updates: Tx 1: Pt received supine in bed, denies pain but does report "tightness" in abdomen, low back. Supine>sit with S with bedrails, HOB elevated, leg lifter and trapeze bar; significantly increased time and all movements appear very effortful. Seated on EOB, pt places transfer board and begins to initiate transfer. After one scoot onto board, pt performs repetitive UE pushups from board but unable to progress hips to L side towards w/c, stating he feels too stiff in his back. Encouraged pt to utilize momentum and head/hips relationship as opposed to pure muscle in transfers. Pt ultimately reports he needs to use the restroom, performs lateral scoot to R side back to bed with S. Sit >stand minA from elevated bed in stedy. Transferred onto toilet totalA in stedy; modA to don/doff shorts and brief with pt performing hygiene with setupA. Alerted CSW to pt's concerns regarding d/c home planned for tomorrow; alerted CSW. CSW, PA, RN present at end of session to discuss pt's options; pt remained seated in w/c at end of session with staff present to continue conversation regarding d/c planning.   Tx 2: Pt received prone on mat table after rest break following OT session; denies pain and reports his back "is loosening up a bit" and agreeable to treatment. Prone on elbows x2 min for lumbar mobility. Quadruped x2 min with cat/cow with min/modA for hip stability. Prone>supine with S and increased time. Rolling R/L with 1.5# UE cuff weights and focus on use of momentum, energy conservation in task, and body mechanics, technique of  rolling to improve efficiency. R sidelying>sit with minA for RLE management. Transfer mat table <>w/c with transfer board; cues for use of momentum, "use as little muscle as possible" and encouraged pt to perform task as quickly as possible with therapist using timer for task; initial transfer 11 seconds>6 seconds>5 seconds with three scoots per trial. Discussed with pt importance of using energy efficient strategies and technique to reduce energy expenditure, increased productivity within a session/day. Performed 3x15 reps lat pull down with 45# on weight stack. Pt asking if he could use crutches to help him transfer, walk; discussed with pt that he needed to be able to stand and balance safely on LLE before being able to efficiently use crutches. Returned to room modI at end of session in w/c.     Therapy Documentation Precautions:  Precautions Precautions: Fall Precaution Comments: RLE hemiparesis Other Brace/Splint: PRAFO R foot Restrictions Weight Bearing Restrictions: No   See Function Navigator for Current Functional Status.   Therapy/Group: Individual Therapy  [name] 07/06/2016, 2:55 PM

## 2016-07-06 NOTE — Progress Notes (Signed)
Social Work Patient ID: Perry GraffJamie Morrison, male   DOB: 07/07/1960, 56 y.o.   MRN: [id number]   Alerted by PT this morning that pt, again, expressing concern about d/c home with grandmother tomorrow.  Asking questions about SNF option, plan for neurology and medications moving forward, etc.  As all of these questions have been answered repeatedly, I coordinated to have Deatra Inaan Angiulli, PA; nurse, Kennon PortelaJeanna Hicks, RN and PT join me to review information once more.  Ultimately, pt requests to change plan to SNF as he simply does not feel his grandmother could provide needed assistance.   I have sent FL2 out to local area SNFs per recommendation of [employer] Dir. Of Social Work (he approved use of LOG as Medicaid and SSD apps are still pending.)  Will keep pt and tx team posted of any bed offers.  Alania Overholt, LCSW

## 2016-07-06 NOTE — Progress Notes (Signed)
RN will provide education to patient regarding I&O cath schedule and proper technique and management of self bowel program. RN discussed with pt importance of following the program and side effects that could happen if bowel and bladder were not empted. Pt is to self cath each time schedule and provide self insertion of suppository at 6am bowel program. RN will continue to educate pt with management of bowel and bladder program. PT to practice transfers [facility] and [facility] with slide board.

## 2016-07-06 NOTE — Progress Notes (Signed)
Per previous RN pt refusing to self administer suppository for bowel program. Pt states, "I don't have the desire to learn." RN also mention that patient will not self cath throughout night because he is "to tired."  RN will educate and encourage self management of bowel and bladder program.

## 2016-07-06 NOTE — Progress Notes (Signed)
Occupational Therapy Session Note  Patient Details  Name: Perry Morrison MRN: [id number] Date of Birth: 02/24/1961  Today's Date: 07/06/2016 OT Individual Time: [id number] and 1300-1345 OT Individual Time Calculation (min): 75 min and 45 min   Short Term Goals: Week 4:  OT Short Term Goal 1 (Week 4): STGs = LTGs  Skilled Therapeutic Interventions/Progress Updates:    Session One: Pt seen for OT session focusing on functional transfers and ADL re-training. Pt sitting up in w/c upon arrival, agreeable to tx session. Cont with complaints of "stiffness" following lumbar puncture, however, willing to participate.  Worked [facility] toileting and [facility]'s home bathroom not accessible from w/c level. Min A sliding board transfer completed to drop arm BSC with assist only to place board. Pt attempted lateral leans for clothing management with significantly increased time due to stiffness. Pt fatiguing quickly and requested assist for clothing management for energy conservation. [facility] while therapist managed clothing. With instruction from RN,He completed lateral lean into w/c and attempted placing hemorrhoid medicine in simulation of placing suppository as part of bowel program. With increased time and guarding assist, pt able to reach to place medication, however, it did not stay in place. RN aware and plan now for pt to place while in sidelying in bed. He transferred back to w/c in same manner as described above. Grooming completed mod I w/c level at sink. Completed min A sliding board transfer into recliner. Left with LEs elevated, all needs in reach, and friend present.   Session Two: Pt seen for OT session focusing on functional transfers and positioning. Pt in recliner upon arrival, agreeable to tx session. Session focusing on sliding board transfers. Completed recliner>w/c>EOB>drop arm BSC>EOB> w/c. All completed with supervision- mod I, pt able to place  sliding board independently. Required significantly increased time for all transfers and rest breaks following each trial. Pt cont with complaints of "tightness" in back. Pt taken to therapy gym and placed in prone position for stretch. Pillows positioned for comfort. Pt left in prone on mat in therapy gym with hand off to PT in 15 minutes.   Therapy Documentation Precautions:  Precautions Precautions: Fall Precaution Comments: RLE hemiparesis Other Brace/Splint: PRAFO R foot Restrictions Weight Bearing Restrictions: No  See Function Navigator for Current Functional Status.   Therapy/Group: Individual Therapy  [name], Sanaz Scarlett C 07/06/2016, 7:15 AM

## 2016-07-07 ENCOUNTER — Inpatient Hospital Stay (HOSPITAL_COMMUNITY): Payer: Self-pay | Admitting: Physical Therapy

## 2016-07-07 ENCOUNTER — Inpatient Hospital Stay (HOSPITAL_COMMUNITY): Payer: Medicaid Other | Admitting: Occupational Therapy

## 2016-07-07 ENCOUNTER — Inpatient Hospital Stay (HOSPITAL_COMMUNITY): Payer: Self-pay | Admitting: Occupational Therapy

## 2016-07-07 ENCOUNTER — Inpatient Hospital Stay (HOSPITAL_COMMUNITY): Payer: Self-pay

## 2016-07-07 DIAGNOSIS — F329 Major depressive disorder, single episode, unspecified: Secondary | ICD-10-CM

## 2016-07-07 NOTE — Consult Note (Signed)
Patient:  Perry GraffJamie Morrison   DOB: 07/03/1960  MR Number: [id number]  [facility] A [id number] [place] [id number] Dept: [phone] Loc: [phone]  Start: 3:30 End: 4:15  Provider/Observer:        Chief Complaint:     Stress and difficulty adjusting to medical issues.  Reason For Service:     The patient was referred for a psychological/neuropsychological consultation.  The patient developed abdominal and back pain that progressed to leg weakness and loss of sensation.  He was admitted to the inpatient unit after ED encounter with initial considerations of Myelitis then there were considerations of a spinal aneurism. However, recent angiogram seem to rule out aneurism.  The patient had been having some possible symptoms for some time.  He had seen a chiropractor up until about a year ago but this did not seem to help his back and stomach pain.  He described symptoms of sever pain going from his toes to his stomach after orgasm during sex.  This would not be associated to physical activity but acutely at time of orgasm.   He also reported that he had very acute leg weakness at various times when he would play tennis, but these would go away and he would feel fine.  The patient had been a very active person and this sudden change in functioning has created great adjustment issues and fear about what the future holds from him.   The patient describes a lot of stress in his life prior to his hospitalization.  He reports that he and his wife divorced and there was a lot of anger.  He reports the two of them were barely on speaking terms.  The patient reports that she has been to see him in the hospital but his 55 yo son has not been able to due to hospital restrictions. The patient has been struggling with issues about discharge and how he will manage at home vs nursing facility.    Interventions  Strategy:  Building coping skills  Participation Level:   Active  Participation Quality:  Appropriate      Behavioral Observation:  Well Groomed, Alert and Lethargic, and Appropriate.   Current Psychosocial Factors: The patient has been struggling with understanding what has happened and what will happen in his life.  He wants to go home and live with grandmother but he does not want his grandmother to have to help him at the level he will need.    Content of Session:   Reviewed current stressors and worked on Pharmacologistcoping skills  Current Status:   The patient reports that he has been frustrated with him not getting better right away.  These frustration have not created a reactive depression.  His reposonse to some of this is to withdrawal and avoid some decisions he is going to have to make.  Patient Progress:   More worry and frustration and now significant depressive symptoms.  Impression/Diagnosis:   The patient is dealing with acute onset of paraplegia with etiological factors still being addessed.  He was a very active person playing tennis, pickleball, and working out at [employer] on a regular basis.  He is dealing with nearly total loss of function in his legs.  Diagnosis:       Depressive Reaction

## 2016-07-07 NOTE — Progress Notes (Signed)
Per night nurse's report, pt self administered the suppository for his 6AM bowel program. Pt also self cath himself at 12P and 6P, with nurse observing. Pt verbalized that he is getting more comfortable with the I&O cath. And, that it was getting easier, the more that he does it.

## 2016-07-07 NOTE — Progress Notes (Signed)
[facility]     PROGRESS NOTE    Subjective/Complaints:   Discussed with neurology. No evidence of inflammation, no evidence of vascular etiology, no evidence of multiple sclerosis."longitudinally extensive hemorrhagic transverse myelitis", no IVIG or plasmapheresis recommended for this diagnosis. There is still one panel that is pending from [facility], which would be available next week.  Patient has no new complaints today. Continues with lower extremity weakness. Also with neurogenic bowel, requiring suppositories  ROS: pt denies nausea, vomiting, diarrhea, cough, shortness of breath or chest pain     Objective: Vital Signs: Blood pressure 107/71, pulse 81, temperature 98.5 F (36.9 C), temperature source Oral, resp. rate 20, height 6\' 1"  (1.854 m), weight 97 kg (213 lb 12.8 oz), SpO2 99 %. No results found. No results for input(s): WBC, HGB, HCT, PLT in the last 72 hours. No results for input(s): NA, K, CL, GLUCOSE, BUN, CREATININE, CALCIUM in the last 72 hours.  Invalid input(s): CO CBG (last 3)  No results for input(s): GLUCAP in the last 72 hours.  Wt Readings from Last 3 Encounters:  06/24/16 97 kg (213 lb 12.8 oz)  05/26/16 102.1 kg (225 lb)    Physical Exam:  Gen: NAD. Marland Kitchen HENT: Normocephalic and atraumatic.  Eyes: EOM are normal. No discharge.  Cardiovascular:RRR Respiratory:clear bilaterally.  GI: ND,NT  Musculoskeletal: low back sore. LP site normal in appearance   Neurological:  He is alert and oriented.  B/l UE 5/5.  RLE: 0/5 HF to distal--stable  LLE: 1+/5 HF, 2- to 2/5 KE and 2/5 ADF/PF-- no change - extensor tone in LE's especially left side-- remains tr-1/4--- DTR's 3+  Decreased sensation below level of injury, right leg more affected than left--stable Skin: Skin remains warm and dry. Intact.  Psych: remains pleasant  Assessment/Plan: 1. Functional and mobility deficits secondary to thoracic myelopathy which  require 3+ hours per day of interdisciplinary therapy in a comprehensive inpatient rehab setting. Physiatrist is providing close team supervision and 24 hour management of active medical problems listed below. Physiatrist and rehab team continue to assess barriers to discharge/monitor patient progress toward functional and medical goals.  Function:  Bathing Bathing position   Position: Wheelchair/chair at sink  Bathing parts Body parts bathed by patient: Right arm, Left arm, Chest, Front perineal area, Abdomen, Buttocks, Right upper leg, Left upper leg, Right lower leg, Left lower leg Body parts bathed by helper: Back  Bathing assist Assist Level: Set up   Set up : To obtain items  Upper Body Dressing/Undressing Upper body dressing   What is the patient wearing?: Pull over shirt/dress     Pull over shirt/dress - Perfomed by patient: Thread/unthread right sleeve, Thread/unthread left sleeve, Put head through opening, Pull shirt over trunk          Upper body assist Assist Level: Set up   Set up : To obtain clothing/put away  Lower Body Dressing/Undressing Lower body dressing   What is the patient wearing?: Ted Hose, Non-skid slipper socks, Pants Underwear - Performed by patient: Thread/unthread left underwear leg, Pull underwear up/down Underwear - Performed by helper: Thread/unthread right underwear leg, Thread/unthread left underwear leg, Pull underwear up/down Pants- Performed by patient: Thread/unthread right pants leg, Thread/unthread left pants leg Pants- Performed by helper: Pull pants up/down Non-skid slipper socks- Performed by patient: Don/doff right sock, Don/doff left sock Non-skid slipper socks- Performed by helper: Don/doff right sock, Don/doff left sock     Shoes - Performed by patient:  Don/doff right shoe, Don/doff left shoe, Fasten right Shoes - Performed by helper: Don/doff right shoe, Don/doff left shoe, Fasten right, Fasten left AFO - Performed by patient:  Don/doff right AFO     TED Hose - Performed by helper: Don/doff right TED hose, Don/doff left TED hose  Lower body assist Assist for lower body dressing: 2 Helpers      Toileting Toileting Toileting activity did not occur: No continent bowel/bladder event Toileting steps completed by patient: Performs perineal hygiene Toileting steps completed by helper: Adjust clothing prior to toileting, Adjust clothing after toileting Toileting Assistive Devices: Grab bar or rail  Toileting assist Assist level: Touching or steadying assistance (Pt.75%)   Transfers Chair/bed transfer Chair/bed transfer activity did not occur: N/A Chair/bed transfer method: Lateral scoot Chair/bed transfer assist level: Supervision or verbal cues Chair/bed transfer assistive device: Armrests, Sliding board     Locomotion Ambulation     Max distance: 5 Assist level: 2 helpers   Wheelchair   Type: Manual Max wheelchair distance: 200 Assist Level: No help, No cues, assistive device, takes more than reasonable amount of time  Cognition Comprehension Comprehension assist level: Follows complex conversation/direction with no assist  Expression Expression assist level: Expresses complex ideas: With no assist  Social Interaction Social Interaction assist level: Interacts appropriately with others - No medications needed.  Problem Solving Problem solving assist level: Solves complex problems: Recognizes & self-corrects  Memory Memory assist level: Complete Independence: No helper   Medical Problem List and Plan: 1.  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   -MRI 2/2 reviewed showing increased in signal intensity throughout thoracic spine    -continue CIR therapies, Search for SNF bed, as discussed with neurology. No need to keep patient in CIR until PA VEL comes back  -appreciate Dr. Catha Browster's And Dr. Consuello BossierKilpatrick's neuro consultations and  workup  -outpt follow up with Dr. Epimenio FootSater as outpt    2.  DVT Prophylaxis/Anticoagulation: SCDs. Venous doppler study negative  -resumed lovenox   3. Pain Management: Hydrocodone as needed  -baclofen for spasms-  10mg  qid--continue at current dose  - klonopin--increased to 0.5mg  TID--with improvement in tone.  -rxing UTI 4. Hypertension. Norvasc 10 mg daily Monitor when out of bed .  Vitals:   07/06/16 1250 07/07/16 0448  BP: 137/85 107/71  Pulse: 87 81  Resp: 17 20  Temp:  98.5 F (36.9 C)      5. Neuropsych: This patient is capable of making decisions on his own behalf.  -provide ego support as needed  -pt with some anxiety related to his clinical condition 6. Skin/Wound Care: Routine skin checks 7. Fluids/Electrolytes/Nutrition:encourage PO 8.CRI stage III. Follow-up chemistries next week after angio 9. Neurogenic bowel/ bladder.  -continue I/O caths   -pt able to perform self-caths   -klebsiella UTI--  keflex 250mg  QID through 2/26, repeat UA. If patient becomes symptomatic for UTI  - daily bowel program appears effective.          Ranelle OysterZachary T. Swartz, MD, [facility]          LOS (Days) 36 A FACE TO FACE EVALUATION WAS PERFORMED  Erick ColaceKIRSTEINS,Vuk Skillern E, MD 07/07/2016 9:35 AM

## 2016-07-07 NOTE — Progress Notes (Signed)
Occupational Therapy Session Note  Patient Details  Name: Perry GraffJamie Belgrave MRN: [id number] Date of Birth: 12/09/1960  Today's Date: 07/07/2016 OT Individual Time: 1100-1203 OT Individual Time Calculation (min): 63 min    Short Term Goals: Week 4:  OT Short Term Goal 1 (Week 4): STGs = LTGs  Skilled Therapeutic Interventions/Progress Updates:    Upon entering the room, pt seated in wheelchair and agreeable to OT intervention. Pt propelled wheelchair 80' to day room. Pt performed slideboard transfer onto mat with set up of wheelchair with increased time and supervision. Supine >sit with steady assistance. Pt attempting to come onto elbows and then come into long sitting but unable to perform without assistance. Pt returning to supine and using momentum to roll to R with min A for LE's. Pt then rolling all the way onto stomach with B UEs placed off of mat for stretch. Pt reports decrease in pain in this position. Pt rolling to towards L to return to supine with min A. Pt performs lateral scoot with slide board to wheelchair with supervision. Pt propels self to back to room and transfers as stated above into recliner chair. Call bell and all needed items within reach upon exiting the room.   Therapy Documentation Precautions:  Precautions Precautions: Fall Precaution Comments: RLE hemiparesis Other Brace/Splint: PRAFO R foot Restrictions Weight Bearing Restrictions: No General:   Vital Signs: Therapy Vitals BP: 115/82 Pain:   ADL:   Exercises:   Other Treatments:    See Function Navigator for Current Functional Status.   Therapy/Group: Individual Therapy  Alen BleacherBradsher, Deontaye Civello P 07/07/2016, 12:44 PM

## 2016-07-07 NOTE — Progress Notes (Signed)
Physical Therapy Weekly Progress Note  Patient Details  Name: Perry Morrison MRN: [id number] Date of Birth: 04/25/1961  Beginning of progress report period: June 28, 2016 End of progress report period: July 07, 2016  Today's Date: 07/07/2016 PT Individual Time: 1340-1400 PT Individual Time Calculation (min): 20 min   Patient has met 4 of 7 long term goals.  Limited progress in the past week due to increase in low back pain follow lumbar puncture, pt missing several sessions due to pain and then slow initiation of all tasks when participating. Focus of sessions on increasing efficiency of mobility tasks, use of momentum to make task easier and quicker to complete with reduced energy expenditure. Pt requires ongoing education regarding w/c level goals, anticipation that pt will not be ambulatory when he leaves rehab, and the realistic feasibility that he can reach a level of S/minA from a w/c level with ability to progress to modI in the future. Pt currently awaiting SNF placement.   Patient continues to demonstrate the following deficits muscle weakness, muscle joint tightness and muscle paralysis, decreased cardiorespiratoy endurance, abnormal tone, unbalanced muscle activation and decreased coordination, decreased attention, decreased awareness, decreased problem solving, decreased memory and delayed processing and decreased sitting balance, decreased standing balance, decreased postural control and decreased balance strategies and therefore will continue to benefit from skilled PT intervention to increase functional independence with mobility.  Patient progressing slowly towards long term goals.  Continue plan of care.  PT Short Term Goals Week 5:  PT Short Term Goal 1 (Week 5): =LTG due to estimated LOS  Skilled Therapeutic Interventions/Progress Updates:    Pt received seated in recliner; reports he had just received lunch tray and wanted to finish eating. Pt with I&O cath schedule on  wall indicating cath scheduled for 12pm; pt report he is unsure if that was performed or not. Alerted RN, NT to pt request/need to be cath'ed prior to participation in therapy. Missed 40 min due to lunch, increased time required for catheterization d/t pt learning to cath independently. Upon therapist return pt agreeable to treatment. Transfer recliner>w/c close S with therapist stabilizing w/c and recliner to prevent sliding; increased time required however much improved from yesterday. Pt appears motivated by timing tasks and challenging him to complete task in less time. Seated LE stretching   Therapy Documentation Precautions:  Precautions Precautions: Fall Precaution Comments: RLE hemiparesis Other Brace/Splint: PRAFO R foot Restrictions Weight Bearing Restrictions: No General: PT Amount of Missed Time (min): 40 Minutes PT Missed Treatment Reason: Nursing care   See Function Navigator for Current Functional Status.  Therapy/Group: Individual Therapy  Luberta Mutter 07/07/2016, 1:51 PM

## 2016-07-07 NOTE — Progress Notes (Signed)
Occupational Therapy Session Note  Patient Details  Name: Aurea GraffJamie Grabinski MRN: [id number] Date of Birth: 01/21/1961  Today's Date: 07/07/2016 OT Individual Time: [id number] OT Individual Time Calculation (min): 60 min    Short Term Goals: Week 4:  OT Short Term Goal 1 (Week 4): STGs = LTGs  Skilled Therapeutic Interventions/Progress Updates:    Pt seen for OT ADL bathing/dressing session. Pt sitting up in w/c upon arrival, desiring to take shower.  He stood into STEADY with mod A and lift used to transfer pt to tub transfer bench in shower. He bathed seated with assist for thoroughness of washing feet and back.  Lift used to transfer pt out of shower. He dressed seated in w/c, assist to place LEs into figure four position. Reacher used to assist with threading LEs into pants. He completed w/c push up for therapist to pull pants up.  Pt completed grooming tasks mod I seated at sink. Left with all needs in reach.    Therapy Documentation Precautions:  Precautions Precautions: Fall Precaution Comments: RLE hemiparesis Other Brace/Splint: PRAFO R foot Restrictions Weight Bearing Restrictions: No Pain:   No/ denies pain  See Function Navigator for Current Functional Status.   Therapy/Group: Individual Therapy  [name], Minaal Struckman C 07/07/2016, 7:10 AM

## 2016-07-07 NOTE — Progress Notes (Signed)
Occupational Therapy Session Note  Patient Details  Name: Perry Morrison MRN: [id number] Date of Birth: 08/01/1960  Today's Date: 07/07/2016 OT Individual Time: 1610-1630 OT Individual Time Calculation (min): 20 min    Short Term Goals: Week 5:     Skilled Therapeutic Interventions/Progress Updates:    Pt seated in recliner after session with neuropsychologist. Pt declined any out of bed activity, but requests BLE stretches d/t hip tightness and spasms. OT completed PROM of BLE in all planes of movement with increased tightness noted in L hip with external rotation. Pt reports feeling more comfortable after stretching. Exited session with pt in recliner with call light in reach and all needs met.   Therapy Documentation Precautions:  Precautions Precautions: Fall Precaution Comments: RLE hemiparesis Other Brace/Splint: PRAFO R foot Restrictions Weight Bearing Restrictions: No General: General PT Missed Treatment Reason: Nursing care Vital Signs:  Pain:   ADL:   Exercises:   Other Treatments:    See Function Navigator for Current Functional Status.   Therapy/Group: Individual Therapy  Tonny Branch 07/07/2016, 5:22 PM

## 2016-07-07 NOTE — Final Consult Note (Signed)
Neurology sign off note  Subjective: No changes   Exam: --------------------------------------           07/06/16     07/07/16                  1250         0448        --------------------------------------  BP:       137/85       107/71        Pulse:      87           81          Resp:       17           20          Temp:             98.5 F (36.9 C) -------------------------------------- Gen: In bed, NAD Resp: non-labored breathing, no acute distress Abd: soft, nt  Neuro: Neuro: MS: awake, alert, [id number], VFF Motor: he has plegia of the right LE, his LLE as significant hip flexion weakness, but stronger distally.  Pertinent Labs:   Brief summary of case/Impression: Please see note by Dr. Shon Hale on 2/20 for excellent summary of this case. Briefly, 56 yo M who presented with LE weakness and was found to have mildly enhancing hemorrhagic spinal lesions. He underwent extensive evaluation including spinal angiography without clear etiology. The only testing still pending at the time of sign of is PAVAL(mayo paraneoplastic panel)  Evaluation: Initial CSF: 1/16  RBC 610  WBC 1  Protein 81  Glucose 56  Cytology - negative ESR CRP ACE NMO ANCA SSA SSB ANA CT/CTA chest abdomen pelvis MRA spine MRI T,L spine  1/16 MRI C-Tspine 2/02 Catheter Spinal angiography Copper, serum Zinc HIV Repeat CSF: 2/20  RBC 255  WBC 4  Protein 146  Glucose 66  Neurological Diagnoses: 1)Longiutudinally extensive hemorrhagic transverse myelitis, idiopathic.   Recommendations: 1) He will need neurology follow up to follow PAVAL panel. I have requested follow up.   2) Neurology to sign off at this time. Please call with any further questions or concerns.  Roland Rack, MD [facility] [phone]  If 7pm- 7am, please page neurology on call as listed in [place].

## 2016-07-07 NOTE — Progress Notes (Signed)
Occupational Therapy Session Note  Patient Details  Name: Perry Morrison MRN: [id number] Date of Birth: 09/28/1960  Today's Date: 07/07/2016 OT Individual Time: 1420-1504 OT Individual Time Calculation (min): 44 min    Short Term Goals: Week 4:  OT Short Term Goal 1 (Week 4): STGs = LTGs  Skilled Therapeutic Interventions/Progress Updates:    Treatment session with focus on transfers, dynamic sitting balance, and increased trunk control during LB dressing tasks.  Completed transfers w/c <> therapy mat with use of slide board with min cues for proper positioning of board and min guard during transfers.  Engaged in circle sitting on therapy mat with pt able to position Lt leg and requiring increased time to position RLE to doff and don hospital socks with min guard for sitting balance.  Lateral leans with focus on trunk shortening and hip hike to simulate pulling pants over buttocks and hips.  Pt demonstrating decreased sitting balance without BUE support.  Engaged in hamstring and hip stretches in sitting with cues to increase stretch.  Returned to w/c, propelled w/c back to room and then completed slide board transfer into recliner with min assist.  Therapy Documentation Precautions:  Precautions Precautions: Fall Precaution Comments: RLE hemiparesis Other Brace/Splint: PRAFO R foot Restrictions Weight Bearing Restrictions: No Pain:   Pt with no c/o pain  See Function Navigator for Current Functional Status.   Therapy/Group: Individual Therapy  Rosalio LoudHOXIE, [place] 07/07/2016, 3:34 PM

## 2016-07-08 ENCOUNTER — Inpatient Hospital Stay (HOSPITAL_COMMUNITY): Payer: Medicaid Other | Admitting: Occupational Therapy

## 2016-07-08 ENCOUNTER — Inpatient Hospital Stay (HOSPITAL_COMMUNITY): Payer: Medicaid Other

## 2016-07-08 ENCOUNTER — Inpatient Hospital Stay (HOSPITAL_COMMUNITY): Payer: Self-pay | Admitting: Physical Therapy

## 2016-07-08 MED ORDER — ENOXAPARIN SODIUM 40 MG/0.4ML ~~LOC~~ SOLN: 40.0000 mg | SUBCUTANEOUS | Status: DC

## 2016-07-08 MED ORDER — ENOXAPARIN SODIUM 30 MG/0.3ML ~~LOC~~ SOLN
30.0000 mg | Freq: Two times a day (BID) | SUBCUTANEOUS | Status: DC
  Administered 2016-07-08 – 2016-07-10 (×5): 30 mg via SUBCUTANEOUS
  Filled 2016-07-08 (×5): qty 0.3

## 2016-07-08 NOTE — Progress Notes (Signed)
RN educated pt on Lovenox injection and importance of medication. Pt self injected abdomen with only cues from RN. RN will continue to educate pt on lovenox injections.

## 2016-07-08 NOTE — Progress Notes (Addendum)
Social Work Patient ID: Perry Morrison, male   DOB: [birth year], 56 y.o.   MRN: [id number]   Met with pt this morning and with his grandmother on the phone as well.  We discussed his plan now to d/c to his grandmother's home and we arranged for her and a friend to be here tomorrow morning to received education from therapies.  Will meet with them both in the morning to confirm all needed DME and hope to plan for d/c on Friday.  Have alerted tx team and MD/ PA.    Donata Clay, Anndrea Mihelich, LCSW

## 2016-07-08 NOTE — Progress Notes (Signed)
[facility]     PROGRESS NOTE    Subjective/Complaints:  Discussed patient with neuropsychology. Patient now willing to go home with grandmother  Patient has no new complaints today. Continues with lower extremity weakness. Also with neurogenic bowel, requiring suppositories  ROS: pt denies nausea, vomiting, diarrhea, cough, shortness of breath or chest pain     Objective: Vital Signs: Blood pressure 109/73, pulse 81, temperature 97.5 F (36.4 C), temperature source Oral, resp. rate 18, height 6\' 1"  (1.854 m), weight 97 kg (213 lb 12.8 oz), SpO2 98 %. No results found. No results for input(s): WBC, HGB, HCT, PLT in the last 72 hours. No results for input(s): NA, K, CL, GLUCOSE, BUN, CREATININE, CALCIUM in the last 72 hours.  Invalid input(s): CO CBG (last 3)  No results for input(s): GLUCAP in the last 72 hours.  Wt Readings from Last 3 Encounters:  06/24/16 97 kg (213 lb 12.8 oz)  05/26/16 102.1 kg (225 lb)    Physical Exam:  Gen: NAD. Marland Kitchen HENT: Normocephalic and atraumatic.  Eyes: EOM are normal. No discharge.  Cardiovascular:RRR Respiratory:clear bilaterally.  GI: ND,NT  Musculoskeletal: low back sore. LP site normal in appearance   Neurological:  He is alert and oriented.  B/l UE 5/5.  RLE: 0/5 HF to distal--stable  LLE: 1+/5 HF, 2- to 2/5 KE and 2/5 ADF/PF-- no change - extensor tone in LE's especially left side-- remains tr-1/4--- DTR's 3+  Decreased sensation below level of injury, right leg more affected than left--stable Skin: Skin remains warm and dry. Intact.  Psych: remains pleasant  Assessment/Plan: 1. Functional and mobility deficits secondary to thoracic myelopathy which require 3+ hours per day of interdisciplinary therapy in a comprehensive inpatient rehab setting. Physiatrist is providing close team supervision and 24 hour management of active medical problems listed below. Physiatrist and rehab team continue to  assess barriers to discharge/monitor patient progress toward functional and medical goals.  Function:  Bathing Bathing position   Position: Shower  Bathing parts Body parts bathed by patient: Right arm, Left arm, Chest, Front perineal area, Abdomen, Buttocks, Right upper leg, Left upper leg Body parts bathed by helper: Back, Left lower leg, Right lower leg  Bathing assist Assist Level: Set up   Set up : To obtain items  Upper Body Dressing/Undressing Upper body dressing   What is the patient wearing?: Pull over shirt/dress     Pull over shirt/dress - Perfomed by patient: Thread/unthread right sleeve, Thread/unthread left sleeve, Put head through opening, Pull shirt over trunk          Upper body assist Assist Level: Set up   Set up : To obtain clothing/put away  Lower Body Dressing/Undressing Lower body dressing   What is the patient wearing?: Shoes, [employer] - Performed by patient: Thread/unthread left underwear leg, Pull underwear up/down Underwear - Performed by helper: Thread/unthread right underwear leg, Thread/unthread left underwear leg, Pull underwear up/down Pants- Performed by patient: Thread/unthread right pants leg, Thread/unthread left pants leg Pants- Performed by helper: Pull pants up/down Non-skid slipper socks- Performed by patient: Don/doff right sock, Don/doff left sock Non-skid slipper socks- Performed by helper: Don/doff right sock, Don/doff left sock     Shoes - Performed by patient: Don/doff right shoe Shoes - Performed by helper: Don/doff left shoe AFO - Performed by patient: Don/doff right AFO     TED Hose - Performed by helper: Don/doff right TED hose, Don/doff left TED hose  Lower body  assist Assist for lower body dressing: 2 Helpers      Toileting Toileting Toileting activity did not occur: No continent bowel/bladder event Toileting steps completed by patient: Performs perineal hygiene Toileting steps completed by helper: Adjust  clothing prior to toileting, Adjust clothing after toileting Toileting Assistive Devices: Grab bar or rail  Toileting assist Assist level: Touching or steadying assistance (Pt.75%)   Transfers Chair/bed transfer Chair/bed transfer activity did not occur: N/A Chair/bed transfer method: Lateral scoot Chair/bed transfer assist level: Supervision or verbal cues Chair/bed transfer assistive device: Armrests, Sliding board     Locomotion Ambulation     Max distance: 5 Assist level: 2 helpers   Wheelchair   Type: Manual Max wheelchair distance: 200 Assist Level: No help, No cues, assistive device, takes more than reasonable amount of time  Cognition Comprehension Comprehension assist level: Follows complex conversation/direction with no assist  Expression Expression assist level: Expresses complex ideas: With extra time/assistive device  Social Interaction Social Interaction assist level: Interacts appropriately with others - No medications needed.  Problem Solving Problem solving assist level: Solves complex problems: Recognizes & self-corrects  Memory Memory assist level: Complete Independence: No helper   Medical Problem List and Plan: 1.  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   -MRI 2/2 reviewed showing increased in signal intensity throughout thoracic spine    -continue CIR therapies, Family training  -appreciate Dr. Catha Browster's And Dr. Consuello BossierKilpatrick's neuro consultations and workup  -outpt follow up with Dr. Epimenio FootSater as outpt    2.  DVT Prophylaxis/Anticoagulation: SCDs. Venous doppler study negative  -resumed lovenox   3. Pain Management: Hydrocodone as needed  -baclofen for spasms-  10mg  qid--continue at current dose  - klonopin--increased to 0.5mg  TID--with improvement in tone.  -rxing UTI 4. Hypertension. Norvasc 10 mg daily Monitor when out of bed .  Vitals:   07/07/16 1802 07/08/16 0454  BP: 120/70  109/73  Pulse: 93 81  Resp: 20 18  Temp: 97.5 F (36.4 C) 97.5 F (36.4 C)      5. Neuropsych: This patient is capable of making decisions on his own behalf.  -provide ego support as needed  -pt with some anxiety related to his clinical condition 6. Skin/Wound Care: Routine skin checks 7. Fluids/Electrolytes/Nutrition:encourage PO 8.CRI stage III. Follow-up chemistries next week after angio 9. Neurogenic bowel/ bladder.  -continue I/O caths   -pt able to perform self-caths   -klebsiella UTI--  keflex 250mg  QID through 2/26, repeat UA. If patient becomes symptomatic for UTI  - daily bowel program appears effective.               LOS (Days) 37 A FACE TO FACE EVALUATION WAS PERFORMED  Erick ColaceKIRSTEINS,Nelsie Domino E, MD 07/08/2016 9:59 AM

## 2016-07-08 NOTE — Plan of Care (Signed)
Problem: RH Balance Goal: LTG Patient will maintain dynamic standing with ADLs (OT) LTG:  Patient will maintain dynamic standing balance with assist during activities of daily living (OT)    Outcome: Not Applicable Date Met: 82/42/35 Goal d/c as standing not at functional level without use of mechanical lift at this time. Napoleon Form, OTR/L 2/28

## 2016-07-08 NOTE — Progress Notes (Addendum)
Occupational Therapy Weekly Progress Note  Patient Details  Name: Perry GraffJamie Tesfaye MRN: [id number] Date of Birth: 07/26/1960  Beginning of progress report period: July 01, 2016 End of progress report period: July 08, 2016  Today's Date: 07/08/2016 OT Individual Time: 1300-1400 OT Individual Time Calculation (min): 60 min    Short term goals were not set last week due to planned d/c earlier this week, however, pt with multiple changes in d/c plans. Plan now back to original plan to d/c to grandmother's home.. Plans for grandmother to complete hands on family education to be completed tomorrow with d/c for Friday.   Patient continues to demonstrate the following deficits: muscle weakness and muscle paralysis, decreased cardiorespiratoy endurance and decreased sitting balance, decreased postural control and decreased balance strategies and therefore will continue to benefit from skilled OT intervention to enhance overall performance with BADL and Reduce care partner burden.  See Patient's Care Plan for progression toward long term goals.  Patient progressing toward long term goals..  Plan of care revisions: Have d/c dynamic standing balance goal as standing not functional at this time without use of mechanical lift. .  Skilled Therapeutic Interventions/Progress Updates:    Pt seen for OT session focusing on functional transfers and ADL re-training. Pt in recliner upon arrival, agreeable to tx session. He completed sliding board transfers throughout session with supervision, occasional VCs for proper w/c set-up and part management. He completed transfers recliner>bed>BSC>bed> w/c. Seated on BSC, pt completed clothing management and simulated hygiene task. Required increased time and effort, however, able to complete independently. VCs throughout for problem solving positioning and techniques. Once returned to w/c, completed LB clothing management again as pt thinking he will dress in chair at  d/c. Completed in same manner as described above. Pt left seated in w/c at end of session completing grooming tasks at sink mod I.  Discussed d/c planning with pt's main concern being managing and paying for all medication/ injections required at d/c. Recommended witting out planned schedule of meds as well as using pill box organizer. Encouraged pt to have his grandmother assist with this as well, pt voiced understanding and agreement.   Therapy Documentation Precautions:  Precautions Precautions: Fall Precaution Comments: RLE hemiparesis Other Brace/Splint: PRAFO R foot Restrictions Weight Bearing Restrictions: No Pain:   No/ denies pain  See Function Navigator for Current Functional Status.   Therapy/Group: Individual Therapy  [name], Cordai Rodrigue C 07/08/2016, 7:28 AM

## 2016-07-08 NOTE — Plan of Care (Signed)
Problem: RH BLADDER ELIMINATION Goal: RH STG MANAGE BLADDER WITH EQUIPMENT WITH ASSISTANCE STG Manage Bladder With Equipment With min Assistance   Pt will manage bladder and self- cathing with some assistance.

## 2016-07-08 NOTE — Progress Notes (Signed)
Physical Therapy Note  Patient Details  Name: Perry Morrison MRN: [id number] Date of Birth: 06/11/1960 Today's Date: 07/08/2016  0930-1030, 60 min individual tx Pain: no c/o  Pt on the phone talking to friend regarding transfporting his grandmother to come for training tomorrow 8A- 12 noon.  D/c plans have changed to grandmother's house.  Pt will have a hospital bed at her home .  Supervision for slide board transfer to R with cues for placement of slide board and sit> supine with extra time and leg strap R; pt limited by bil LE spasms during transitional movements.  SBT = 1 min 30 seconds; this PT did not tell him he was being timed. Rolling R with supervision, mod cues to turn head fully to R.  Attempted prone> quadruped, but pt's knees slidling unsafely.  Press-up into prone on elbows x 3.  Rolled L to move into long sitting; pt scooted to end of mat with assistance for bil LEs to sit EOM.  Pt transferred back to w/c to L as above.  Pt modified independent to/from room propelling w/c with bil UEs.   See function navigator for current status.   [name] 07/08/2016, 7:56 AM

## 2016-07-08 NOTE — Progress Notes (Signed)
Physical Therapy Session Note  Patient Details  Name: Perry GraffJamie Hoyt MRN: [id number] Date of Birth: 04/25/1961  Today's Date: 07/08/2016 PT Individual Time: 0800-0900 and 1400-1500 PT Individual Time Calculation (min): 60 min and 60 min (total 120 min)   Short Term Goals: Week 5:  PT Short Term Goal 1 (Week 5): =LTG due to estimated LOS  Skilled Therapeutic Interventions/Progress Updates: Tx 1: Pt received seated in w/c at sink completing hygiene modI; denies pain and agreeable to treatment. Discussed recommendation with pt that he not spend frequent therapy sessions stretching LEs as he knows how to do this independently sitting in the w/c, recliner or in bed, and therapy sessions would be more beneficial if spent working on progressing functional tasks; pt states understanding. Pt doffs socks, dons shoes seated in w/c with AE and increased time; totalA for donning TEDs. Pt requesting to trial RLE leg loop again after several discussions with therapist regarding energy efficiency; donned totalA by therapist. Transfer w/c <>tub bench with S and transfer board; pt performed lateral leans and semi-squat to doff/don pants to knees. Significantly increased time required and cues from therapist for technique. Returned to room modI at end of session.   Tx 2: Pt received seated in w/c, denies pain and agreeable to treatment. 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 W/c propulsion modI x175' for BUE strengthening. Gait 2x16' with L handrail, maxA from therapist for RLE progression and stance control, pt's friend providing w/c follow for safety when pt fatigued. Requires cues for hip extension, upright posture, focus on breathing. Transfer w/c <>mat table with transfer board and S. Sit >supine minA using leg lifter. Supine>long sit>short sit with standbyA, leg lifter. Returned to room  modI in w/c at end of session.      Therapy Documentation Precautions:  Precautions Precautions: Fall Precaution Comments: RLE hemiparesis Other Brace/Splint: PRAFO R foot Restrictions Weight Bearing Restrictions: No   See Function Navigator for Current Functional Status.   Therapy/Group: Individual Therapy  [name] 07/08/2016, 9:02 AM

## 2016-07-08 NOTE — Patient Care Conference (Signed)
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 Reactive depression   . Transverse myelitis (HCC)   . Benign essential HTN   . 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 Weakness of right leg   . Neurogenic bowel   . Neurogenic bladder   . Incomplete paraplegia (HCC)   . GI bleed 05/30/2016  . Hypertension 05/26/2016  . Constipation 05/26/2016  . Abnormal MRI, thoracic spine 05/26/2016  . Myelitis (HCC)   . Right leg weakness   . Urinary retention   . AKI (acute kidney injury) (HCC)   . Aneurysm (HCC)   . CKD (chronic kidney disease), stage III     Expected Discharge Date: Expected Discharge Date:  (SNF)  Team Members Present: Physician leading conference: Dr. 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 Continent on toilet. Selft I&O's cath q 6hrs. Pt continuing to self cath  Managed bowel and bladder  Monitor for effectiveness and continue to educate    Swallow/Nutrition/ Hydration             ADL's   Supervision sliding board transfers; mod A toileting and LB dressing; Mod I UB bathing/dressing and grooming  Supervision- min A  ADL re-training; functional transfers/ mobility; activity tolerance   Mobility   S bed mobility, transfers, modI w/c management  modI w/c <>bed, S car transfers, modI w/c management, d/c'ed gait goals  transfer training, dynamic sitting balance, bed mobility   Communication             Safety/Cognition/ Behavioral Observations            Pain   No c/o pain  <3  Assess for nonverbal cues of pain   Skin   CDI  No skin breakdown  Assess skin q shift. Assist to turn q 2hrs      *See Care Plan and progress notes for long and short-term goals.  Barriers to Discharge: spasticity, mobility, trasnferes, neurogenic bowel/bladder, HTN, spasms    Possible Resolutions to Barriers:  Therapies, bowel/bladder training    Discharge Planning/Teaching Needs:  Plan now changed to SNF per pt as he does not feel he can d/c to grandmother's home.      Team Discussion:  No new medical; neurology has given final diagnosis.  Treating UTI;  Able to self-cath but still not able to place suppository.  Need to keep STEDY out of room and consistently use BSC.  Supervision transfers with transfer board.  Addressing energy efficiency.  Per SW, pt  has changed plan to SNF.  Revisions to Treatment Plan:  Change in d/c plan.   Continued Need for Acute Rehabilitation Level of Care: The patient requires daily medical management by a physician with specialized training in physical medicine and rehabilitation for the following conditions: Daily direction of a multidisciplinary physical rehabilitation program to ensure safe treatment while eliciting the highest outcome that is of practical value to the patient.: Yes Daily medical management of patient stability for increased activity during participation in an intensive rehabilitation  regime.: Yes Daily analysis of laboratory values and/or radiology reports with any subsequent need for medication adjustment of medical intervention for : Neurological problems;Urological problems  Perry Morrison 07/08/2016, 1:38 PM

## 2016-07-09 ENCOUNTER — Inpatient Hospital Stay (HOSPITAL_COMMUNITY): Payer: Self-pay | Admitting: Physical Therapy

## 2016-07-09 ENCOUNTER — Inpatient Hospital Stay (HOSPITAL_COMMUNITY): Payer: Self-pay | Admitting: Occupational Therapy

## 2016-07-09 ENCOUNTER — Ambulatory Visit (HOSPITAL_COMMUNITY): Payer: Self-pay | Admitting: Physical Therapy

## 2016-07-09 DIAGNOSIS — R140 Abdominal distension (gaseous): Secondary | ICD-10-CM

## 2016-07-09 DIAGNOSIS — R208 Other disturbances of skin sensation: Secondary | ICD-10-CM

## 2016-07-09 LAB — MISC LABCORP TEST (SEND OUT): Labcorp test code: 9985

## 2016-07-09 MED ORDER — POLYETHYLENE GLYCOL 3350 17 G PO PACK: 17.0000 g | PACK | Freq: Every day | ORAL | 0 refills | Status: AC

## 2016-07-09 MED ORDER — VALACYCLOVIR HCL 500 MG PO TABS: 500.0000 mg | ORAL_TABLET | Freq: Two times a day (BID) | ORAL | 0 refills | Status: AC

## 2016-07-09 MED ORDER — GABAPENTIN 300 MG PO CAPS
300.0000 mg | ORAL_CAPSULE | Freq: Three times a day (TID) | ORAL | Status: DC
  Administered 2016-07-09 – 2016-07-10 (×4): 300 mg via ORAL
  Filled 2016-07-09 (×4): qty 1

## 2016-07-09 MED ORDER — ALPRAZOLAM 0.5 MG PO TABS
0.5000 mg | ORAL_TABLET | Freq: Three times a day (TID) | ORAL | 0 refills | Status: AC | PRN
Start: 2016-07-09 — End: ?

## 2016-07-09 MED ORDER — PANTOPRAZOLE SODIUM 40 MG PO TBEC: 40.0000 mg | DELAYED_RELEASE_TABLET | Freq: Every day | ORAL | 0 refills | Status: AC

## 2016-07-09 MED ORDER — ENOXAPARIN SODIUM 30 MG/0.3ML ~~LOC~~ SOLN: SUBCUTANEOUS | 1 refills | Status: AC

## 2016-07-09 MED ORDER — SENNOSIDES-DOCUSATE SODIUM 8.6-50 MG PO TABS: 2.0000 | ORAL_TABLET | Freq: Two times a day (BID) | ORAL | Status: AC

## 2016-07-09 MED ORDER — AMLODIPINE BESYLATE 10 MG PO TABS: 10.0000 mg | ORAL_TABLET | Freq: Every day | ORAL | 3 refills | Status: AC

## 2016-07-09 MED ORDER — GABAPENTIN 300 MG PO CAPS: 300.0000 mg | ORAL_CAPSULE | Freq: Three times a day (TID) | ORAL | 1 refills | Status: AC

## 2016-07-09 MED ORDER — METHOCARBAMOL 500 MG PO TABS: 500.0000 mg | ORAL_TABLET | Freq: Four times a day (QID) | ORAL | 0 refills | Status: AC | PRN

## 2016-07-09 MED ORDER — HYDROCODONE-ACETAMINOPHEN 5-325 MG PO TABS: 1.0000 | ORAL_TABLET | ORAL | 0 refills | Status: AC | PRN

## 2016-07-09 MED ORDER — BACLOFEN 10 MG PO TABS: 10.0000 mg | ORAL_TABLET | Freq: Four times a day (QID) | ORAL | 1 refills | Status: AC

## 2016-07-09 MED ORDER — BISACODYL 10 MG RE SUPP: 10.0000 mg | Freq: Every day | RECTAL | 0 refills | Status: AC

## 2016-07-09 MED ORDER — CLONAZEPAM 0.5 MG PO TABS: 0.5000 mg | ORAL_TABLET | Freq: Three times a day (TID) | ORAL | 1 refills | Status: AC

## 2016-07-09 MED ORDER — HYDROCORTISONE ACETATE 25 MG RE SUPP: 25.0000 mg | Freq: Every day | RECTAL | 0 refills | Status: AC

## 2016-07-09 NOTE — Plan of Care (Signed)
Problem: RH BOWEL ELIMINATION Goal: RH STG MANAGE BOWEL WITH ASSISTANCE STG Manage Bowel with min Assistance.   Outcome: Progressing Pt will be able to manage bowel movements with 1-person assistance, given tools such as suppository, sliding board, bedside commode.

## 2016-07-09 NOTE — Progress Notes (Signed)
Physical Therapy Session Note  Patient Details  Name: Perry GraffJamie Swanner MRN: [id number] Date of Birth: 02/26/1961  Today's Date: 07/09/2016 PT Individual Time: 0900-1000 and 1400-1530 PT Individual Time Calculation (min): 60 min and 90 min (total 150 min)   Short Term Goals: Week 5:  PT Short Term Goal 1 (Week 5): =LTG due to estimated LOS  Skilled Therapeutic Interventions/Progress Updates: Tx 1: Pt received seated in w/c, denies pain and agreeable to treatment. W/c management in hall with cues for efficiency while propelling, reducing number of pushes/distance to decrease demand on shoulders. Pt able to propel at similar speed to therapist's walking speed down hallway when cued for speed (21 sec/150 ft) compared to baseline 42 sec/150 ft prior to education. Discussed goal of propelling at speed to interact appropriately with environment/society around him; encouraged pt that while propelling is uncomfortable he reports pain does not worsen with faster propulsion, therefore it is more beneficial and energy conserving to propel with larger more efficient pushes. [name] and family friend present for remainder of session for focus on preparing to d/c home. Pt performed car transfer with transfer board and S. Transfer to return to bed with transfer board and S, sit >supine with S and leg loop RLE. Educated pt, grandmother and family friend on pressure relief, use of transfer board for safety and skin integrity. Family with questions regarding equipment and requesting PA discuss diagnosis/prognosis with family. CSW and PA alerted. Remained supine in bed at end of session, all needs in reach.   Tx 2: Pt received supine in bed with handoff from RN; denies pain and agreeable to tx. Supine>sit with leg lifter, lateral scoot transfer to w/c with modI and significantly increased time. W/c propulsion throughout session with modI. Gait with L rail and maxA for RLE progression and stance control, w/c follow for  safety. Cues throughout for breath control, upright posture. UE strengthening exercises including lat pull down, tricep extension, rowing, bicep curls. Educated pt regarding options for exercise in a gym setting and modifying exercises to be performed in w/c; recommended pt work with Systems analystpersonal trainer or staff member at gym with experience in modifying exercises to w/c level. Squat pivot transfer w/c <>mat table with S. Sit <>stand x3 trials with RW and mod/maxA. Returned to room in w/c modI at end of session.      Therapy Documentation Precautions:  Precautions Precautions: Fall Precaution Comments: RLE hemiparesis Other Brace/Splint: PRAFO R foot Restrictions Weight Bearing Restrictions: No   See Function Navigator for Current Functional Status.   Therapy/Group: Individual Therapy  [name] 07/09/2016, 11:00 AM

## 2016-07-09 NOTE — Progress Notes (Signed)
[facility] PHYSICAL MEDICINE & REHABILITATION     PROGRESS NOTE    Subjective/Complaints:  Complains of bandlike tightness around the upper abdominal area. Has had some constipation. He states that this discomfort comes and goes. No diaphoresis.  ROS: pt denies nausea, vomiting, diarrhea, cough, shortness of breath or chest pain     Objective: Vital Signs: Blood pressure 112/77, pulse 76, temperature 98.3 F (36.8 C), temperature source Oral, resp. rate 18, height 6\' 1"  (1.854 m), weight 98.2 kg (216 lb 8 oz), SpO2 99 %. No results found. No results for input(s): WBC, HGB, HCT, PLT in the last 72 hours. No results for input(s): NA, K, CL, GLUCOSE, BUN, CREATININE, CALCIUM in the last 72 hours.  Invalid input(s): CO CBG (last 3)  No results for input(s): GLUCAP in the last 72 hours.  Wt Readings from Last 3 Encounters:  07/08/16 98.2 kg (216 lb 8 oz)  05/26/16 102.1 kg (225 lb)    Physical Exam:  Gen: NAD. Marland Kitchen. HENT: Normocephalic and atraumatic.  Eyes: EOM are normal. No discharge.  Cardiovascular:RRR Respiratory:clear bilaterally.  GI: ND,NT , abdomen is soft with positive bowel sounds  Musculoskeletal: low back sore. LP site normal in appearance   Neurological:  He is alert and oriented.  B/l UE 5/5.  RLE: 0/5 HF to distal--stable  LLE: 1+/5 HF, 2- to 2/5 KE and 2/5 ADF/PF-- no change - extensor tone in LE's especially left side-- remains tr-1/4--- DTR's 3+  Decreased sensation below level of injury, right leg more affected than left--stable Skin: Skin remains warm and dry. Intact.  Psych: remains pleasant  Assessment/Plan: 1. Functional and mobility deficits secondary to thoracic myelopathy which require 3+ hours per day of interdisciplinary therapy in a comprehensive inpatient rehab setting. Physiatrist is providing close team supervision and 24 hour management of active medical problems listed below. Physiatrist and rehab team continue to assess barriers to  discharge/monitor patient progress toward functional and medical goals.  Function:  Bathing Bathing position   Position: Shower  Bathing parts Body parts bathed by patient: Right arm, Left arm, Chest, Front perineal area, Abdomen, Buttocks, Right upper leg, Left upper leg Body parts bathed by helper: Back, Left lower leg, Right lower leg  Bathing assist Assist Level: Set up   Set up : To obtain items  Upper Body Dressing/Undressing Upper body dressing   What is the patient wearing?: Pull over shirt/dress     Pull over shirt/dress - Perfomed by patient: Thread/unthread right sleeve, Thread/unthread left sleeve, Put head through opening, Pull shirt over trunk          Upper body assist Assist Level: Set up   Set up : To obtain clothing/put away  Lower Body Dressing/Undressing Lower body dressing   What is the patient wearing?: Shoes, [employer] Hose Underwear - Performed by patient: Thread/unthread left underwear leg, Pull underwear up/down Underwear - Performed by helper: Thread/unthread right underwear leg, Thread/unthread left underwear leg, Pull underwear up/down Pants- Performed by patient: Thread/unthread right pants leg, Thread/unthread left pants leg Pants- Performed by helper: Pull pants up/down Non-skid slipper socks- Performed by patient: Don/doff right sock, Don/doff left sock Non-skid slipper socks- Performed by helper: Don/doff right sock, Don/doff left sock     Shoes - Performed by patient: Don/doff right shoe Shoes - Performed by helper: Don/doff left shoe AFO - Performed by patient: Don/doff right AFO     TED Hose - Performed by helper: Don/doff right TED hose, Don/doff left TED hose  Lower  body assist Assist for lower body dressing: 2 Helpers      Toileting Toileting Toileting activity did not occur: No continent bowel/bladder event Toileting steps completed by patient: Adjust clothing prior to toileting, Performs perineal hygiene, Adjust clothing after  toileting Toileting steps completed by helper: Adjust clothing after toileting Toileting Assistive Devices: Grab bar or rail  Toileting assist Assist level: Supervision or verbal cues   Transfers Chair/bed transfer Chair/bed transfer activity did not occur: N/A Chair/bed transfer method: Lateral scoot Chair/bed transfer assist level: Supervision or verbal cues Chair/bed transfer assistive device: Armrests, Sliding board     Locomotion Ambulation     Max distance: 16 Assist level: 2 helpers   Wheelchair   Type: Manual Max wheelchair distance: 200 Assist Level: No help, No cues, assistive device, takes more than reasonable amount of time  Cognition Comprehension Comprehension assist level: Follows complex conversation/direction with no assist  Expression Expression assist level: Expresses complex ideas: With extra time/assistive device  Social Interaction Social Interaction assist level: Interacts appropriately with others - No medications needed.  Problem Solving Problem solving assist level: Solves complex problems: Recognizes & self-corrects  Memory Memory assist level: Complete Independence: No helper   Medical Problem List and Plan: 1.  Bilateral lower extremity weakness secondary to Longitudinally extensive hemorrhagic transverse myelitis -MRI 2/2 reviewed showing increased in signal intensity throughout thoracic spine    -continue CIR therapies, Family training  -appreciate Dr. Catha Brow And Dr. Consuello Bossier neuro consultations and workup  -outpt follow up with Dr. Epimenio Foot as outpt, patient received call from [facility] neurology to set this up    2.  DVT Prophylaxis/Anticoagulation: SCDs. Venous doppler study negative  -resumed lovenox   3. Pain Management: Hydrocodone as needed, he has some zone of partial preservation bandlike dysesthetic pain. Trial of gabapentin. If helpful, he will go home with a prescription.  -baclofen for spasms-  10mg  qid--continue at current dose  -  klonopin--increased to 0.5mg  TID--with improvement in tone.  -rxing UTI 4. Hypertension. Norvasc 10 mg daily Monitor when out of bed .  Vitals:   07/08/16 1711 07/09/16 0511  BP: 134/73 112/77  Pulse: 89 76  Resp: 17 18  Temp: 98.1 F (36.7 C) 98.3 F (36.8 C)      5. Neuropsych: This patient is capable of making decisions on his own behalf.  -provide ego support as needed  -pt with some anxiety related to his clinical condition 6. Skin/Wound Care: Routine skin checks 7. Fluids/Electrolytes/Nutrition:encourage PO 8.CRI stage III. Follow-up chemistries next week after angio 9. Neurogenic bowel/ bladder.  -continue I/O caths   -pt able to perform self-caths   -klebsiella UTI--  resolved after antibiotic treatment - daily bowel program appears effective.               LOS (Days) 38 A FACE TO FACE EVALUATION WAS PERFORMED  Erick Colace, MD 07/09/2016 10:12 AM

## 2016-07-09 NOTE — Discharge Instructions (Signed)
Inpatient Rehab Discharge Instructions  Perry GraffJamie Morrison Discharge date and time: No discharge date for patient encounter.   Activities/Precautions/ Functional Status: Activity: activity as tolerated Diet: regular diet Wound Care: none needed Functional status:  ___ No restrictions     ___ Walk up steps independently ___ 24/7 supervision/assistance   ___ Walk up steps with assistance ___ Intermittent supervision/assistance  ___ Bathe/dress independently ___ Walk with walker     _x__ Bathe/dress with assistance ___ Walk Independently    ___ Shower independently ___ Walk with assistance    ___ Shower with assistance ___ No alcohol     ___ Return to work/school ________    COMMUNITY REFERRALS UPON DISCHARGE:    Home Health:     RN       SW                      Agency: [facility]    Phone: [phone]    Medical Equipment/Items Ordered: wheelchair, cushion, hospital bed,   drop arm commode and transfer board                                                     Agency/Supplier:  [facility] @ [phone]    GENERAL COMMUNITY RESOURCES FOR PATIENT/FAMILY:  Support Groups:   [email or web address]    For online resource information    Mental Health:  As needed, Dr. Arley PhenixJohn Rodenbough @ [phone]         Special Instructions: Intermittent catheterization every 6 hours  Continue subcutaneous Lovenox 30 mg twice daily until 08/24/2016 and stop   My questions have been answered and I understand these instructions. I will adhere to these goals and the provided educational materials after my discharge from the hospital.  Patient/Caregiver Signature _______________________________ Date __________  Clinician Signature _______________________________________ Date __________  Please bring this form and your medication list with you to all your follow-up doctor's appointments.

## 2016-07-09 NOTE — Progress Notes (Signed)
Occupational Therapy Discharge Summary  Patient Details  Name: Perry Morrison MRN: [id number] Date of Birth: January 21, 1961   Patient has met 6 of 6 long term goals due to improved activity tolerance, improved balance, postural control, ability to compensate for deficits and improved coordination.  Patient to discharge at overall Supervision- min A level.  Patient's care partner is independent to provide the necessary physical assistance at discharge.  Family education completed with pt's grandmother and friend. Limited hands on assist provided, however, all members voiced feeling comfortable and confident with planned d/c home. Educated regarding pt's current level of function, areas of assist, importance of maintaining timing with toileting/cathing and life threatening medical implications if this is not completed.   Recommendation:  Patient will benefit from ongoing skilled OT services in home health setting to continue to advance functional skills in the area of BADL and Reduce care partner burden.  Equipment: bariatric drop arm BSC  Reasons for discharge: treatment goals met and discharge from hospital  Patient/family agrees with progress made and goals achieved: Yes  OT Discharge Precautions/Restrictions  Precautions Precautions: Fall Vision/Perception  Vision- History Baseline Vision/History: No visual deficits Patient Visual Report: No change from baseline  Cognition Overall Cognitive Status: Impaired/Different from baseline Arousal/Alertness: Awake/alert Orientation Level: Oriented X4 Memory: Impaired Memory Impairment: Decreased recall of new information;Decreased short term memory Decreased Short Term Memory: Verbal complex;Functional complex Awareness: Appears intact Problem Solving: Appears intact Safety/Judgment: Appears intact Sensation Sensation Light Touch: Impaired Detail Light Touch Impaired Details: Impaired LLE;Impaired RLE Proprioception: Impaired by gross  assessment Additional Comments: B LE paraperesis R>L Coordination Gross Motor Movements are Fluid and Coordinated: No Fine Motor Movements are Fluid and Coordinated: Yes Coordination and Movement Description: B LE paraperesis R>L Motor  Motor Motor: Paraplegia;Other (comment) Motor - Skilled Clinical Observations: 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 Trunk/Postural Assessment  Cervical Assessment Cervical Assessment: Within Functional Limits Thoracic Assessment Thoracic Assessment: Within Functional Limits Lumbar Assessment Lumbar Assessment: Within Functional Limits Postural Control Postural Control: Deficits on evaluation (Decreased postural control )  Balance Balance Balance Assessed: Yes Static Sitting Balance Static Sitting - Level of Assistance: 6: Modified independent (Device/Increase time) Dynamic Sitting Balance Dynamic Sitting - Balance Support: During functional activity;Feet supported Dynamic Sitting - Level of Assistance: 6: Modified independent (Device/Increase time) Extremity/Trunk Assessment RUE Assessment RUE Assessment: Within Functional Limits LUE Assessment LUE Assessment: Within Functional Limits   See Function Navigator for Current Functional Status.  Perry Morrison C 07/09/2016, 3:19 PM

## 2016-07-09 NOTE — Discharge Summary (Signed)
Discharge summary job [id number]

## 2016-07-09 NOTE — Plan of Care (Signed)
Problem: RH BLADDER ELIMINATION Goal: RH STG MANAGE BLADDER WITH ASSISTANCE STG Manage Bladder With min  Assistance   Outcome: Progressing When provided with supplies, pt will be able to manage bladder emptying independently.

## 2016-07-09 NOTE — Progress Notes (Signed)
Pt returned demonstrated I&O cath technique, with an output of of yellow, straw color urine.

## 2016-07-09 NOTE — Progress Notes (Signed)
Physical Therapy Discharge Summary  Patient Details  Name: Perry Morrison MRN: [id number] Date of Birth: December 29, 1960  Today's Date: 07/09/2016   Patient has met 6 of 6 long term goals due to improved activity tolerance, improved balance, improved postural control, increased strength, ability to compensate for deficits, functional use of  right upper extremity, left upper extremity and left lower extremity, improved attention and improved awareness.  Patient to discharge at a wheelchair level Supervision.   Patient's care partner is independent to provide the necessary physical assistance at discharge.  Reasons goals not met: All goals met  Recommendation:  Patient will benefit from ongoing skilled PT services in home health setting to continue to advance safe functional mobility, address ongoing impairments in strength, coordination, activity tolerance, and minimize fall risk.  Equipment: W/c, transfer board, hospital bed  Reasons for discharge: treatment goals met and discharge from hospital  Patient/family agrees with progress made and goals achieved: Yes  PT Discharge Precautions/Restrictions Precautions Precautions: Fall Vision/Perception    WFL Cognition Overall Cognitive Status: Impaired/Different from baseline Arousal/Alertness: Awake/alert Orientation Level: Oriented X4 Memory: Impaired Memory Impairment: Decreased recall of new information;Decreased short term memory Decreased Short Term Memory: Verbal complex;Functional complex Awareness: Appears intact Problem Solving: Appears intact Safety/Judgment: Appears intact Sensation Sensation Light Touch: Impaired Detail Light Touch Impaired Details: Impaired LLE;Impaired RLE Proprioception: Impaired by gross assessment Additional Comments: B LE paraperesis R>L Coordination Gross Motor Movements are Fluid and Coordinated: No Fine Motor Movements are Fluid and Coordinated: Yes Coordination and Movement Description: B LE  paraperesis R>L Motor  Motor Motor: Paraplegia;Other (comment) Motor - Skilled Clinical Observations: 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  Mobility Bed Mobility Bed Mobility: Rolling Right;Rolling Left Rolling Right: 6: Modified independent (Device/Increase time);With rail Rolling Left: 6: Modified independent (Device/Increase time);With rail Transfers Transfers: Yes Sit to Stand: 3: Mod assist;With armrests (with hall rail LUE, or RW) Sit to Stand Details: Verbal cues for technique;Tactile cues for weight beaing;Tactile cues for weight shifting;Manual facilitation for placement Squat Pivot Transfers: 5: Supervision Squat Pivot Transfer Details: Verbal cues for technique;Verbal cues for precautions/safety Lateral/Scoot Transfers: 6: Modified independent (Device/Increase time);With slide board Locomotion  Ambulation Ambulation: Yes Ambulation/Gait Assistance: 2: Max assist;1: +2 Total assist (w/c follow for safety) Ambulation Distance (Feet): 25 Feet Ambulation/Gait Assistance Details: Verbal cues for technique;Verbal cues for gait pattern;Verbal cues for sequencing;Tactile cues for posture;Tactile cues for weight beaing;Tactile cues for weight shifting;Tactile cues for placement Ambulation/Gait Assistance Details: totalA for RLE progression and stance control Gait Gait: Yes Gait Pattern: Impaired Gait Pattern: Poor foot clearance - right;Decreased dorsiflexion - right;Decreased weight shift to right;Decreased stance time - right;Decreased stride length;Step-through pattern Gait velocity: significantly slow for age norms Stairs / Additional Locomotion Stairs: No Architect: Yes Wheelchair Assistance: 6: Modified independent (Device/Increase time) Environmental health practitioner: Both upper extremities Wheelchair Parts Management: Independent Distance: >300'  Trunk/Postural Assessment  Cervical Assessment Cervical Assessment: Within Functional  Limits Thoracic Assessment Thoracic Assessment: Within Functional Limits Lumbar Assessment Lumbar Assessment: Within Functional Limits Postural Control Postural Control: Deficits on evaluation (Decreased postural control )  Balance Balance Balance Assessed: Yes Static Sitting Balance Static Sitting - Level of Assistance: 6: Modified independent (Device/Increase time) Dynamic Sitting Balance Dynamic Sitting - Balance Support: During functional activity;Feet supported Dynamic Sitting - Level of Assistance: 6: Modified independent (Device/Increase time) Extremity Assessment  RUE Assessment RUE Assessment: Within Functional Limits LUE Assessment LUE Assessment: Within Functional Limits RLE Assessment RLE Assessment: Exceptions to [facility] RLE Strength RLE Overall Strength Comments: no  active movement in supine except trace at hip LLE Assessment LLE Assessment: Exceptions to [facility] (grossly 4/5 to 4+/5 throughout)   See Function Navigator for Current Functional Status.  [name] 07/09/2016, 3:44 PM

## 2016-07-09 NOTE — Progress Notes (Addendum)
Occupational Therapy Session Note  Patient Details  Name: Perry Morrison MRN: [id number] Date of Birth: 01/07/1961  Today's Date: 07/09/2016 OT Individual Time: [id number] OT Individual Time Calculation (min): 70 min    Short Term Goals: Week 5:   STG=LTG due to LOS  Skilled Therapeutic Interventions/Progress Updates:    Pt seen for OT session focusing on family education with pt's grandmother and friend. Pt in w/c upon arrival with grandmother and friend present, agreeable to tx session. Throughout session, transfers completed via sliding board with supervision, occasional VCs for awareness of w/c set-up and positioning.  Completed simulated toileting task and transfers and education provided regarding how to assist pt if needed with toileting tasks. Educated throughout regarding importance of pt's participation and independence with ADLs as well as prioritizing tasks and energy conservation as needed. Reviewed pt's plans for bathing/dressing including ability to complete in various positions including bed level, w/c level, or on BSC. Pt's w/c will not fit into home bathroom.  Education provided regarding signs/ symptoms of autonomic dysreflexia, importance of adhering to timing of cathing, bowel programs, and medications and medical implications if these are not completed properly. Recommendation to purchase BP monitor to assess vitals. Pt left seated in w/c at end of session, all questions answered and all voiced feeling comfortable and confident with planned d/c home tomorrow.   Therapy Documentation Precautions:  Precautions Precautions: Fall Precaution Comments: RLE hemiparesis Other Brace/Splint: PRAFO R foot Restrictions Weight Bearing Restrictions: No Pain:   No/ denies pain  See Function Navigator for Current Functional Status.   Therapy/Group: Individual Therapy  [name], Heidi Maclin C 07/09/2016, 7:17 AM

## 2016-07-10 ENCOUNTER — Inpatient Hospital Stay (HOSPITAL_COMMUNITY): Payer: Medicaid Other | Admitting: Occupational Therapy

## 2016-07-10 ENCOUNTER — Telehealth: Payer: Self-pay | Admitting: Physical Medicine & Rehabilitation

## 2016-07-10 NOTE — Telephone Encounter (Signed)
I spoke with pharmacist but  Was unable to answer questions.  I have given her contact numbers for Dan PA and the PA office#.

## 2016-07-10 NOTE — Progress Notes (Addendum)
Social Work  Discharge Note  The overall goal for the admission was met for:   Discharge location: Yes - actually multiple changes in d/c plan but final plan to d/c to home with grandmother  Length of Stay: No  - due to changing d/c plans (i.e. Home vs SNF) and medical issues, this did lead to longer LOS than anticipated.  Final LOS = 39 days  Discharge activity level: Yes - met downgraded goals of mod ind/ supervision w/c level goals  Home/community participation: Yes  Services provided included: MD, RD, PT, OT, SLP, RN, TR, Pharmacy, Neuropsych and SW  Financial Services: No insurance upon admission, however, Medicaid and SSD applications taken and pending at time of d/c.  Follow-up services arranged: Home Health: RN, SW via [place], DME: specialty w/c, cushion, hopsital bed with trapeze and wide drop arm commode all via [facility] and Patient/Family has no preference for HH/DME agencies  Comments (or additional information):  Of note, discussed with team and pt that pt does not have a qualifying diagnosis under Medicaid to cover HHPT/ OT.  Pt aware and unable to privately pay for this.  He has been provided with a home exercise program and discussed "pro bono" PT clinic at [facility] as well (handout info provided to pt).  Patient/Family verbalized understanding of follow-up arrangements: Yes  Individual responsible for coordination of the follow-up plan: pt  Confirmed correct DME delivered: Kazmir Oki 07/10/2016    Emmarose Klinke

## 2016-07-10 NOTE — Progress Notes (Signed)
[place] PHYSICAL MEDICINE & REHABILITATION     PROGRESS NOTE    Subjective/Complaints:  Doing well. Anxious for discharge to home.  ROS: pt denies nausea, vomiting, diarrhea, cough, shortness of breath or chest pain     Objective: Vital Signs: Blood pressure 111/74, pulse 80, temperature 98.5 F (36.9 C), temperature source Oral, resp. rate 18, height 6\' 1"  (1.854 m), weight 98.2 kg (216 lb 8 oz), SpO2 100 %. No results found. No results for input(s): WBC, HGB, HCT, PLT in the last 72 hours. No results for input(s): NA, K, CL, GLUCOSE, BUN, CREATININE, CALCIUM in the last 72 hours.  Invalid input(s): CO CBG (last 3)  No results for input(s): GLUCAP in the last 72 hours.  Wt Readings from Last 3 Encounters:  07/08/16 98.2 kg (216 lb 8 oz)  05/26/16 102.1 kg (225 lb)    Physical Exam:  Gen: NAD. Marland Kitchen HENT: Normocephalic and atraumatic.  Eyes: EOM are normal. No discharge.  Cardiovascular:RRR Respiratory:clear bilaterally.  GI: ND,NT , abdomen is soft with positive bowel sounds  Musculoskeletal: low back sore. LP site normal in appearance   Neurological:  He is alert and oriented.  B/l UE 5/5.  RLE: 0/5 HF to distal--stable  LLE: 1+/5 HF, 2- to 2/5 KE and 2/5 ADF/PF-- no change - extensor tone in LE's especially left side-- remains tr-1/4--- DTR's 3+  Decreased sensation below level of injury, right leg more affected than left--stable Skin: Skin remains warm and dry. Intact.  Psych: remains pleasant  Assessment/Plan: 1. Functional and mobility deficits secondary to thoracic myelopathy which require 3+ hours per day of interdisciplinary therapy in a comprehensive inpatient rehab setting. Physiatrist is providing close team supervision and 24 hour management of active medical problems listed below. Physiatrist and rehab team continue to assess barriers to discharge/monitor patient progress toward functional and medical goals.  Function:  Bathing Bathing position    Position: Shower  Bathing parts Body parts bathed by patient: Right arm, Left arm, Chest, Front perineal area, Abdomen, Buttocks, Right upper leg, Left upper leg, Right lower leg, Left lower leg, Back Body parts bathed by helper: Back, Left lower leg, Right lower leg  Bathing assist Assist Level: Supervision or verbal cues   Set up : To obtain items  Upper Body Dressing/Undressing Upper body dressing   What is the patient wearing?: Pull over shirt/dress     Pull over shirt/dress - Perfomed by patient: Thread/unthread right sleeve, Thread/unthread left sleeve, Put head through opening, Pull shirt over trunk          Upper body assist Assist Level: More than reasonable time   Set up : To obtain clothing/put away  Lower Body Dressing/Undressing Lower body dressing   What is the patient wearing?: Non-skid slipper socks Underwear - Performed by patient: Thread/unthread left underwear leg, Pull underwear up/down Underwear - Performed by helper: Thread/unthread right underwear leg, Thread/unthread left underwear leg, Pull underwear up/down Pants- Performed by patient: Thread/unthread right pants leg, Thread/unthread left pants leg Pants- Performed by helper: Pull pants up/down Non-skid slipper socks- Performed by patient: Don/doff right sock, Don/doff left sock Non-skid slipper socks- Performed by helper: Don/doff right sock, Don/doff left sock     Shoes - Performed by patient: Don/doff right shoe Shoes - Performed by helper: Don/doff left shoe AFO - Performed by patient: Don/doff right AFO     TED Hose - Performed by helper: Don/doff right TED hose, Don/doff left TED hose  Lower body assist Assist for lower  body dressing: 2 Designer, multimediaHelpers      Toileting Toileting Toileting activity did not occur: No continent bowel/bladder event Toileting steps completed by patient: Adjust clothing prior to toileting, Performs perineal hygiene, Adjust clothing after toileting Toileting steps completed  by helper: Adjust clothing after toileting Toileting Assistive Devices: Grab bar or rail  Toileting assist Assist level: Supervision or verbal cues   Transfers Chair/bed transfer Chair/bed transfer activity did not occur: N/A Chair/bed transfer method: Lateral scoot Chair/bed transfer assist level: No Help, no cues, assistive device, takes more than a reasonable amount of time Chair/bed transfer assistive device: Armrests, Sliding board     Locomotion Ambulation     Max distance: 25 Assist level: 2 helpers (maxA, w/c follow)   Wheelchair   Type: Manual Max wheelchair distance: 200 Assist Level: No help, No cues, assistive device, takes more than reasonable amount of time  Cognition Comprehension Comprehension assist level: Follows basic conversation/direction with no assist  Expression Expression assist level: Expresses complex 90% of the time/cues < 10% of the time  Social Interaction Social Interaction assist level: Interacts appropriately with others with medication or extra time (anti-anxiety, antidepressant).  Problem Solving Problem solving assist level: Solves basic 90% of the time/requires cueing < 10% of the time  Memory Memory assist level: Recognizes or recalls 75 - 89% of the time/requires cueing 10 - 24% of the time   Medical Problem List and Plan: 1.  Bilateral lower extremity weakness secondary to Longitudinally extensive hemorrhagic transverse myelitis -MRI 2/2 reviewed showing increased in signal intensity throughout thoracic spine    -continue CIR therapies, Family training  -appreciate Dr. Catha Browster's And Dr. Consuello BossierKilpatrick's neuro consultations and workup  -outpt follow up with Dr. Epimenio FootSater as outpt, [facility]    2.  DVT Prophylaxis/Anticoagulation: SCDs. Venous doppler study negative  -resumed lovenox   3. Pain Management: Hydrocodone as needed, he has some zone of partial preservation bandlike dysesthetic pain. Trial of  gabapentin. If helpful, he will go home with a prescription.  -baclofen for spasms-  10mg  qid--continue at current dose  - klonopin--increased to 0.5mg  TID--with improvement in tone.  -rxing UTI 4. Hypertension. Norvasc 10 mg daily Monitor when out of bed .  Vitals:   07/09/16 1552 07/10/16 0415  BP: 116/64 111/74  Pulse: 100 80  Resp: 18 18  Temp: 98.1 F (36.7 C) 98.5 F (36.9 C)      5. Neuropsych: This patient is capable of making decisions on his own behalf.  -provide ego support as needed  -pt with some anxiety related to his clinical condition 6. Skin/Wound Care: Routine skin checks 7. Fluids/Electrolytes/Nutrition:encourage PO 8.CRI stage III. Follow-up creatinine 1.00 9. Neurogenic bowel/ bladder.  -continue I/O caths   -pt able to perform self-caths   -klebsiella UTI--  resolved after antibiotic treatment - daily bowel program appears effective.               LOS (Days) 39 A FACE TO FACE EVALUATION WAS PERFORMED  Alayne Estrella J., PA-C 07/10/2016 9:01 AM

## 2016-07-10 NOTE — Progress Notes (Signed)
Pt discharged at 1330 with friends to grandmothers home. Discharge instructions given to pt by Harvel Ricksan Anguilli, PA with verbal understanding. Pt manages bowel bladder program independently. Belongings with pt.

## 2016-07-10 NOTE — Telephone Encounter (Signed)
[facility] questions about some [facility] medications for patient, his muscle relaxers and anti anxiety medications.  Please call her at [phone].

## 2016-07-10 NOTE — Progress Notes (Signed)
Occupational Therapy Session Note  Patient Details  Name: Aurea GraffJamie Swaminathan MRN: [id number] Date of Birth: 02/19/1961  Today's Date: 07/10/2016 OT Individual Time: 0930-1015 OT Individual Time Calculation (min): 45 min    Short Term Goals: Week 4:  OT Short Term Goal 1 (Week 4): STGs = LTGs  Skilled Therapeutic Interventions/Progress Updates:    Pt seen for OT ADL bathing/dressing session. Pt sitting up in w/c upon arrival, ready for showering activity.  Functional transfers completed via STEADY,  Pt able to stand into STEADY with supervision. He bathed seated on tub bench with assist for reaching feet. Reviewed use of LH sponge for increased independence with self care tasks.  He returned to bed and from long sitting postion, pt able to don B ted hose and pants following VCs for technique, rolled to pull pants up. Pt required reminders throughout session for modifiied techniques/ use of AE for increased independence including use of leg lifter/ leg loops. Pt left in supine at end of session with CSW entering. Pt voiced feeling ready for planned d/c home this afternoon.    Therapy Documentation Precautions:  Precautions Precautions: Fall Precaution Comments: RLE hemiparesis Other Brace/Splint: PRAFO R foot Restrictions Weight Bearing Restrictions: No Pain: Pain Assessment Pain Assessment: No/denies pain  See Function Navigator for Current Functional Status.   Therapy/Group: Individual Therapy  [name], Antonela Freiman C 07/10/2016, 10:04 AM

## 2016-07-10 NOTE — Discharge Summary (Signed)
Perry Morrison, CHILSON NO.:  [id number]  MEDICAL RECORD NO.:  [id number]  LOCATION:  4W02C                        FACILITY:  [facility]  PHYSICIAN:  [name], M.D.DATE OF BIRTH:  1960-09-09  DATE OF ADMISSION:  06/01/2016 DATE OF DISCHARGE:  07/10/2016                              DISCHARGE SUMMARY   DISCHARGE DIAGNOSES: 1. Hemorrhagic longitudinally extensive transverse myelitis. 2. Subcutaneous Lovenox for deep venous thrombosis prophylaxis. 3. Pain management. 4. Hyperspasticity. 5. Hypertension. 6. Chronic renal insufficiency, stage III. 7. Neurogenic bowel and bladder.  HISTORY OF PRESENT ILLNESS:  This is a 56 year old right-handed African American male with history of hypertension, on no prescription medications, chronic renal insufficiency stage III, creatinine 1.32. Per chart review, currently living with a male roommate that works during the day.  He is going through a divorce recently, lost his job, independent prior to admission.  Most of his family live in the [place] area.  Presented May 26, 2016 with abdominal pain, constipation times several days.  Initially seen in the emergency room, given some muscle relaxers and developed right lower extremity weakness. MRI of thoracic spine review demonstrated swelling in the midportion of the cord at T5 level with some right-sided lesion consistent with hemorrhage within the cord down to the level of T9.  There was some mass effect in the cord itself from the lesion.  No spinal AVM or fistula identified.  The rest of cervical spine film is unremarkable.  Cranial CT scan negative.  MRI of the brain showed no significant white matter signal abnormalities to suggest demyelinating disease.  Lumbar puncture completed.  Total protein 81, glucose 56, CSF showed no white cells. Neurosurgery as well as Neurology consulted, workup suggestive of transverse myelitis versus thoracic myelopathy.  Placed on  intravenous Solu-Medrol x5 doses and completed.  Positive occult stool May 30, 2016, felt to be related to possible hemorrhoids and straining for his bowel program.  His hemoglobin and hematocrit stable.  Physical and occupational therapy ongoing.  The patient was admitted for comprehensive rehab program.  PAST MEDICAL HISTORY:  See discharge diagnoses.  SOCIAL HISTORY:  The patient had been living with a roommate, independent prior to admission.  Currently going through a divorce.  He plans to be going home with his grandmother.  FUNCTIONAL STATUS:  Upon admission to rehab services, +2 physical assist, sit to stand; minimal guard, supine to sit.  Max total assist activities of daily living, lower extremities.  PHYSICAL EXAMINATION:  VITAL SIGNS:  Blood pressure 153/73, pulse 78, temp 97, and respirations 20. GENERAL:  This was an alert male, well developed, well nourished. Pupils are round and reactive to light. NECK:  Supple.  Nontender.  No JVD. CARDIAC:  Regular rate and rhythm.  No murmur.  Normal heart sounds. LUNGS:  Clear to auscultation without wheeze. ABDOMEN:  Soft, bowel sounds are normal.  He exhibits no extension. EXTREMITIES:  Bilateral upper extremities 5/5.  Right lower extremity 1/5.  Proximal to distal left lower extremity 4-/5 hip flexors, knee extension, and 3/5 ankle dorsiflexion and plantar flexion.  DTRs were 1+ at the left lower extremity.  REHABILITATION HOSPITAL COURSE:  The patient was admitted to inpatient  rehab services with therapies initiated on a 3-hour daily basis, consisting of physical therapy, occupational therapy, and rehabilitation nursing.  The following issues were addressed during the patient's rehabilitation stay.  For hemorrhagic longitudinally extensive transverse myelitis.  He continued to be followed closely by Neurology Services.  He completed a course of Solu-Medrol.  His workup was extensive.  During workup, there was some  concern of possible ruptured AVM or dural AV fistula in the thoracic spine.  Conventional angiography of the spine was recommended.  He underwent full workup with sedimentation rate CRP, NMO antibody ANCA panels and MRI of thoracic spine was later repeated and demonstrated some mild extension of the signal abnormality from T3 up to T2 and from T10 down to T11. Arrangements were made per Interventional Radiology for spinal angiogram, completed June 16, 2016, which was a limited study due to the fact he had a significant overlying gas and fecal material that precluded visualization of portions of the vasculature.  Angiogram repeated June 26, 2016 showed no obvious vascular abnormality such as AVM or dural AV fistula.  Workup ongoing consistent with hemorrhagic longitudinally extensive transverse myelitis, which was discussed at length with the patient.  There was some request by patient for possible need to transfer to a tertiary center for ongoing workup; however, it was discussed at length that full workup had been completed and treatment remained limited.  A followup lumbar puncture was also completed and P[facility]AL/[facility] paraneoplastic panel was pending.  No current plan to pursue IVIG or treatment otherwise at this time. Pertinent labs showed CSF WBC of 4, CSF RBCs of 255, protein 146, and glucose 66.  The patient remained on subcutaneous Lovenox for DVT prophylaxis at time of discharge with full education completed that he would remain on through approximately August 24, 2016.  Pain management spasticity, the patient on baclofen 10 mg 4 times daily as well as hydrocodone, Robaxin as needed. Neurontin was added at 300 mg 3 times daily.  He did receive venous Doppler of lower extremities that were negative.  He remained on subcutaneous Lovenox. Klonopin was added for his spasticity and tone.  Blood pressures remained controlled on low-dose Norvasc.  Neurogenic bowel and  bladder, intermittent catheterizations every 6 hours of which the patient had gone through full education for he did complete a course of Keflex for Klebsiella UTI remaining afebrile.  HOSPITAL COURSE:  The patient remained quite frustrated.  He received followup by Neuropsychology for emotional support and ongoing care. Depressive reaction hospital course related emotional support provided. The patient received weekly collaborative interdisciplinary team conferences.  Plan on establishing appropriate discharge and management. Working with energy conservation techniques balance.  Transfers recliner wheelchair close supervision with therapist stabilizing wheelchair and recliner to prevent sliding needing some increased time to complete tasks.  Supine to sit with supervision with bed rails.  He was using a sliding board for appropriate transfers.  Perform lateral scoot to the right side and back to bed with supervision.  Sit to stand, minimal assist from elevated bed.  Transferred onto toilet, moderate assist for donning and doffing shorts.  Activities of daily living and homemaking. He bathes, seated with assistance for washing feet and back.  Dressed seated in wheelchair using his reacher.  He completed wheelchair pushed up for therapist to pull up his pants.  After ongoing numerous discussions with the patient and appropriate discharge planning, it was later established and requested by the patient to be discharged to home with his grandmother with full  family teaching completed.  DISCHARGE MEDICATIONS:  Included Norvasc 10 mg p.o. daily, baclofen 10 mg p.o. q.i.d., Dulcolax suppository daily, Klonopin 0.5 mg p.o. t.i.d., Lovenox 30 mg every 12 hours through August 24, 2016 and stop.  Anusol suppository daily, MiraLAX daily, Senokot-S two tablets p.o. b.i.d., Valtrex 500 mg p.o. b.i.d., Xanax 0.5 mg p.o. t.i.d. as needed, hydrocodone 1 tab p.o. every 4 hours as needed pain, and Robaxin  500 mg p.o. every 6 hours as needed muscle spasms.  DIET:  His diet was regular.  FOLLOWUP:  The patient received followed by Dr. Faith Rogue at the outpatient rehab service office as directed.  Dr. Despina Arias of [facility] [address] [place], [id number].  Appointment to be made and ambulatory referral had been placed, Aarush Kato of [address] [place], [place].  SPECIAL INSTRUCTIONS:  Intermittent catheterizations every 6 hours. Monitoring residuals.  The patient will continue subcutaneous Lovenox 30 mg twice daily until August 24, 2016 and stop.     Mariam Dollar, P.A.   ______________________________ [name], M.D.    DA/MEDQ  D:  07/09/2016  T:  07/10/2016  Job:  [id number]  cc:   Metro Kung, M.D. Stefani Dama, M.D. Despina Arias, MD

## 2016-07-13 ENCOUNTER — Telehealth: Payer: Self-pay | Admitting: *Deleted

## 2016-07-13 NOTE — Telephone Encounter (Signed)
Patient left a message, recent hospital discharge, He is asking for results of his latest lumbar test (puncture)

## 2016-07-14 ENCOUNTER — Telehealth: Payer: Self-pay | Admitting: Neurology

## 2016-07-14 NOTE — Telephone Encounter (Signed)
All CSF results which were sent out that I could find came back negative. His only abnormal value was his protein which was elevated. The patient should have already known about that. I would ask that he review all findings with Neurology. I believe he is supposed to be seeing Dr. Epimenio FootSater

## 2016-07-14 NOTE — Telephone Encounter (Signed)
Patient called office in reference to new patient appointment with Dr. Epimenio FootSater 07/16/16.  Patient states he is unable to make it to the appointment due to the condition he is currently in, but would like to know if he is able to receive lumbar puncture results over the phone.  Please call

## 2016-07-14 NOTE — Telephone Encounter (Signed)
I have spoken with pt.  As he has never been seen in our office, he would need to get LP results from the ordering physician.  He verbalized understanding of same/fim

## 2016-07-14 NOTE — Telephone Encounter (Signed)
He is upset that he doesn't know anything more about and if and when he will heal. I told him to discuss this with Dr Epimenio FootSater which was scheduled on 07/16/16 but he says he has cancelled [place] someone there because of the hardship getting back and forth.

## 2016-07-16 ENCOUNTER — Ambulatory Visit: Payer: MEDICAID | Admitting: Neurology

## 2016-07-24 ENCOUNTER — Encounter: Payer: Self-pay | Admitting: Neurology

## 2016-09-08 DEATH — deceased

## 2016-10-09 NOTE — Addendum Note (Signed)
Addendum  created 10/09/16 1010 by Dawnell Bryant D, MD   Sign clinical note    

## 2017-07-06 IMAGING — CR DG ABD PORTABLE 1V
1 series · 1 of 1 positions shown · non-contrast
Comparison: None.

CLINICAL DATA: Abdominal distension for 1 month.

EXAM:
PORTABLE ABDOMEN - 1 VIEW

[AP]
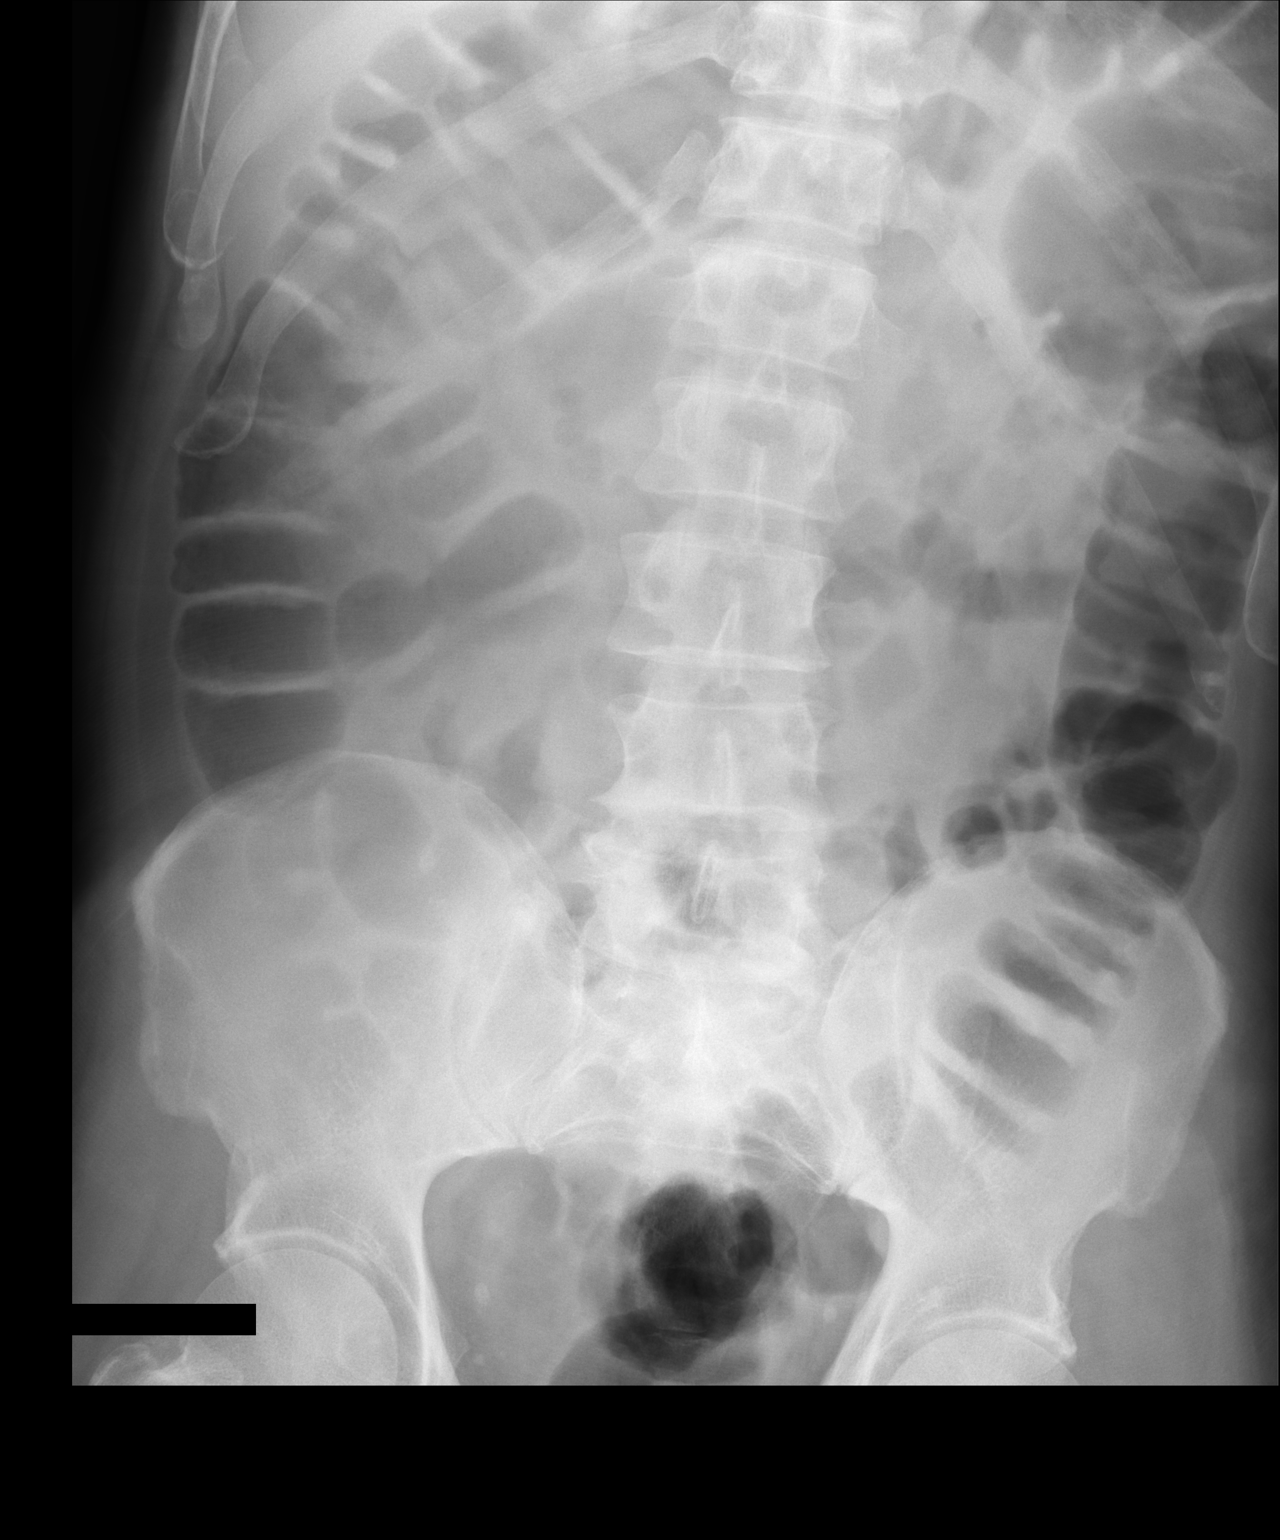

[1 of 1 positions shown; findings below may reference images not displayed]

FINDINGS: Air-filled colon is identified without gross distention. No small
bowel dilatation. No other abnormalities.
IMPRESSION: No evidence of obstruction.  Mild ileus not excluded.

## 2017-07-11 IMAGING — XA IR ANGIO/ADRENAL/UNI*L*
12 of 21 series · 13 of 24 positions shown · IV contrast (IODINE)
Comparison: Spinal angiogram of 06/16/2016.

INDICATION: Progressive weakness of the lower extremities. Abnormal appearance
of the lower thoracic cord on MRI examination.

EXAM:
1. Diagnostic spinal arteriogram
TECHNIQUE: Following a full explanation of the procedure along with the
potential associated complications, an informed witnessed consent
was obtained. The risks of worsening neurological deficit,
ventilator dependency, death were all reviewed in detail with the
patient.

[Series 1: carotid · 1 of 9 frames shown (1 of 11)]
[frame 2/9  full-range]
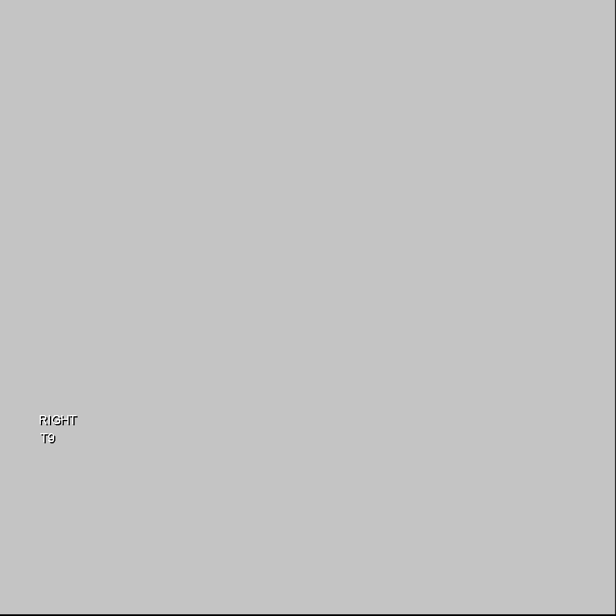

[Series 2: carotid · 1 of 15 frames shown (2 of 11)]
[frame 13/15]
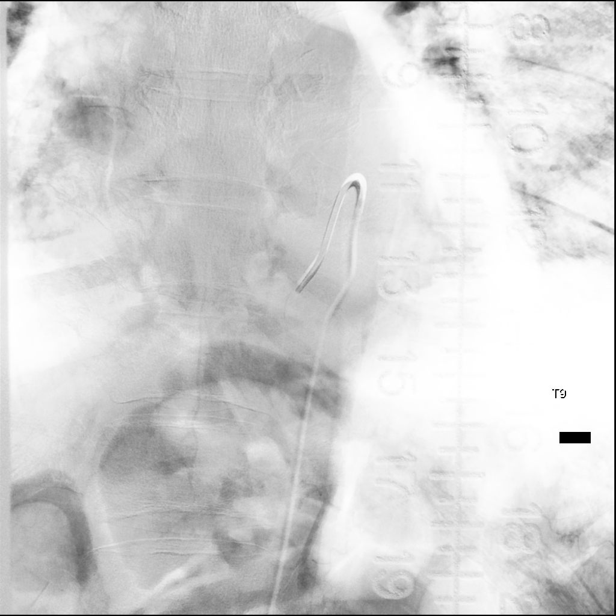

[Series 4: carotid · 1 of 13 frames shown (3 of 11)]
[frame 12/13]
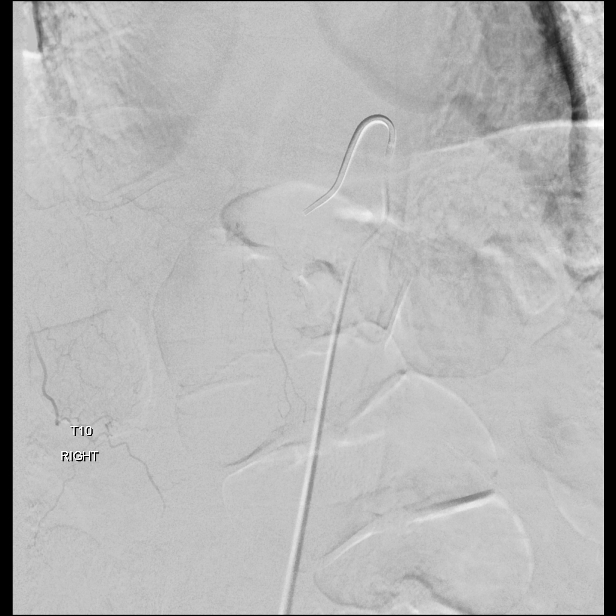

[Series 6: carotid · 1 of 35 frames shown (4 of 11)]
[frame 18/35]
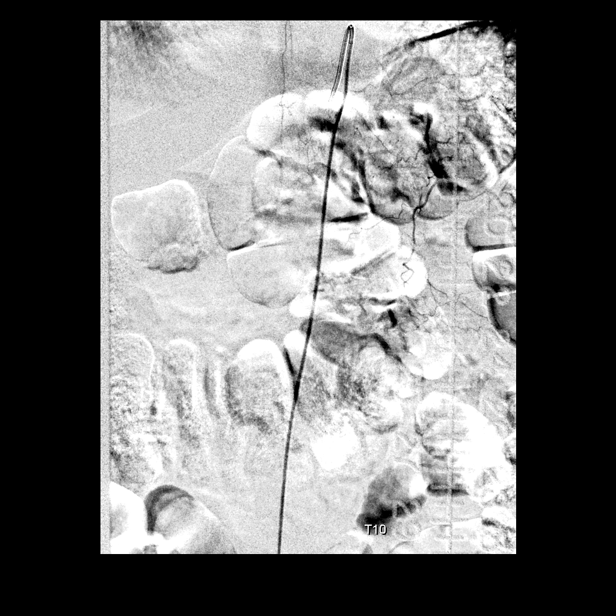

[Series 8: carotid · 1 of 19 frames shown (5 of 11)]
[frame 10/19]
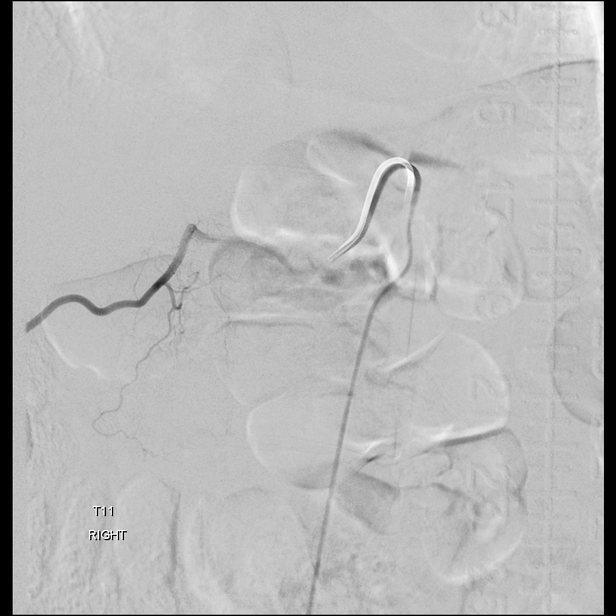

[Series 10: carotid · 1 of 18 frames shown (6 of 11)]
[frame 9/18]
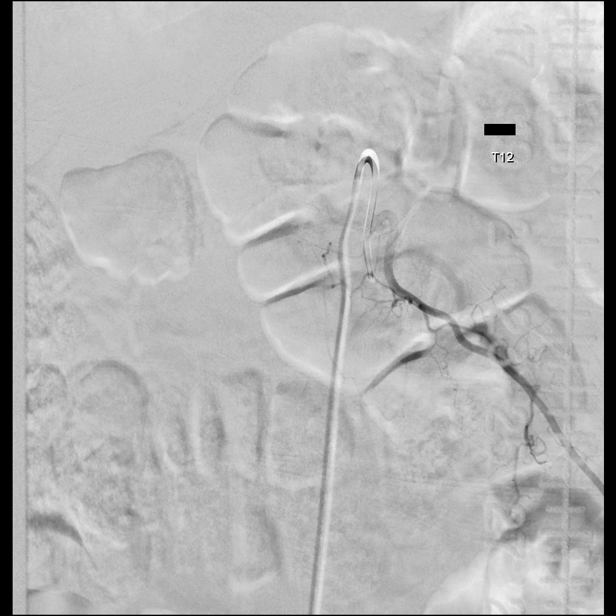

[Series 12: carotid · 2 of 12 frames shown (7 of 11)]
[frame 2/12]
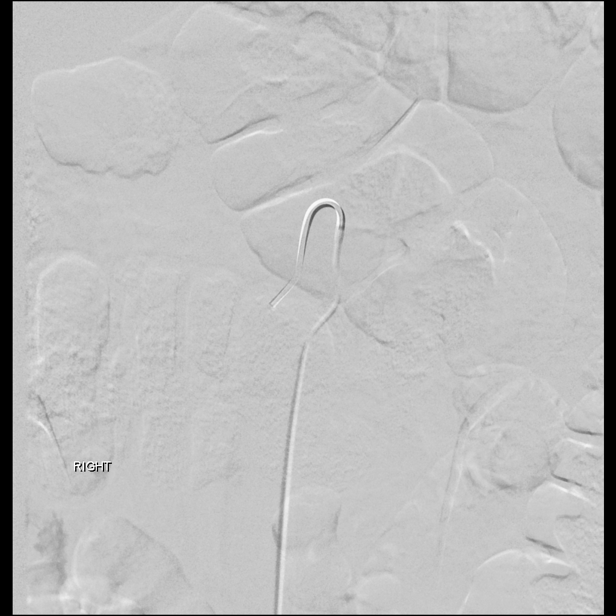
[frame 11/12]
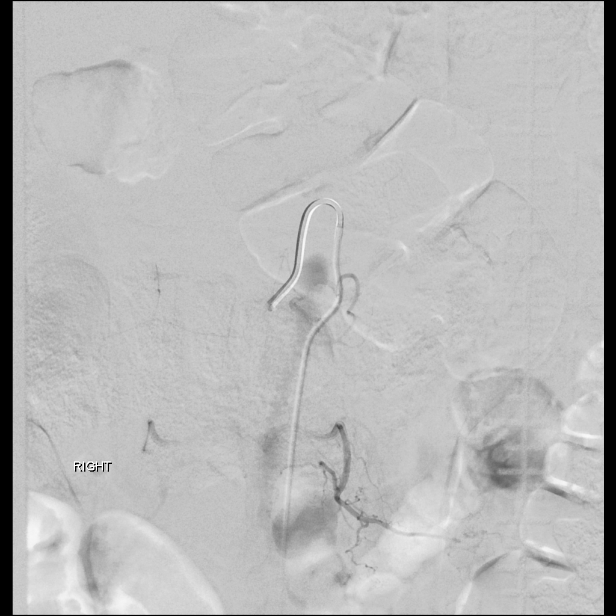

[Series 14: carotid · 1 of 15 frames shown (8 of 11)]
[frame 13/15]
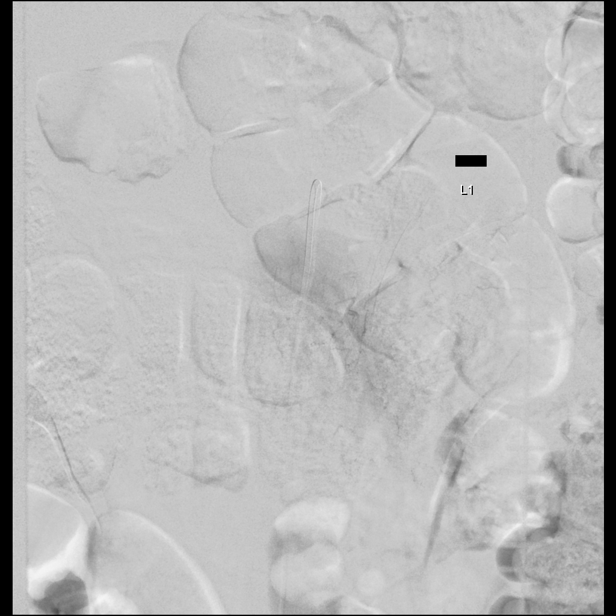

[Series 16: carotid · 1 of 16 frames shown (9 of 11)]
[frame 9/16]
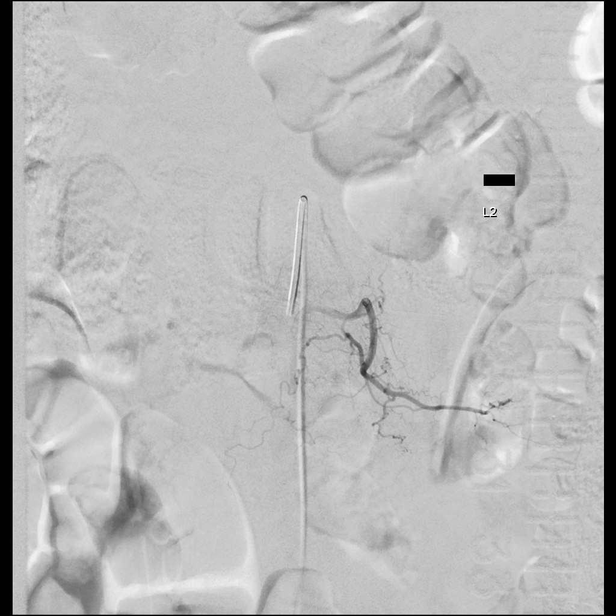

[Series 18: carotid · 1 of 20 frames shown (10 of 11)]
[frame 11/20]
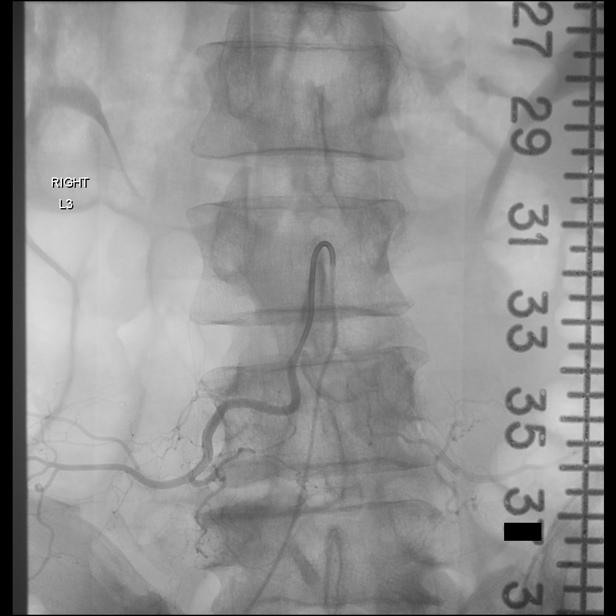

[Series 20: carotid · 2 acquisitions, 1 frame shown (11 of 11)]
[im 1/2]
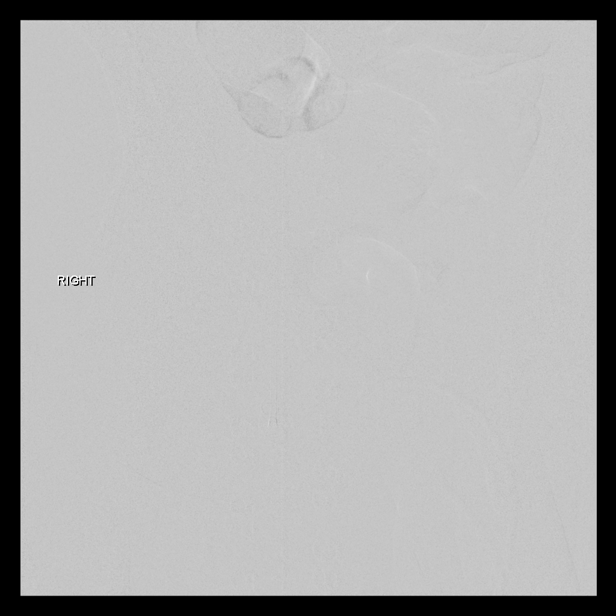

[Series 300: dr. (person_name) · 1 of 156 slices shown]
[im 47/156]
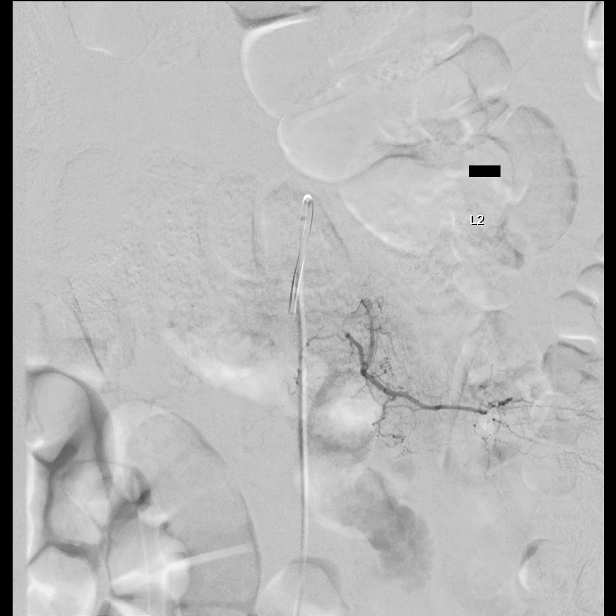

[13 of 24 positions shown; findings below may reference images not displayed]

MRI scan of the
thoracic spine of 06/12/2016.

MEDICATIONS:
None. The antibiotic was administered within 1 hour of the
procedure.

ANESTHESIA/SEDATION:
Mac anesthesia.

CONTRAST:  Isovue 300 approximately 80 cc.

FLUOROSCOPY TIME:  Fluoroscopy Time: 12 minutes 40 seconds (2029
mGy).

COMPLICATIONS:
None immediate.
Mac anesthesia was utilized and given by the [REDACTED] at [HOSPITAL].

The right groin was prepped and draped in the usual sterile fashion.
Thereafter using modified Seldinger technique, transfemoral access
into the right common femoral artery was obtained without
difficulty. Over a 0.035 inch guidewire a 5 French Pinnacle sheath
was inserted. Through this, and also over a 0.035 inch guidewire a 5
Ardonis Prasnikar catheter was advanced to the descending thoracic
aorta, where it was formed into its J hook shaped configuration.

Thereafter, selective cannulation were performed as mentioned below.
Selective arteriograms were then performed also as mentioned below.
FINDINGS: Selective catheterizations and arteriograms were performed of the
right and left T9 intercostal arteries, the right and left T10
intercostal arteries, the right and left T11 intercostal arteries,
the right and left T12 intercostal arteries, the left L1 lumbar
artery, and the common origin of the right and left L1 lumbar
arteries, and the right and left L2 lumbar arteries, and the right
and left L3 lumbar arteries.

PROCEDURE:
At all these levels, the selective catheter arteriograms
demonstrated no evidence of abnormal arteriovenous shunting, or of
prominent arterial or delayed prominent venous vasculature.

At the left T9 and the left T10 levels, prominent medially oriented
midline hairpin turns are noted. At both of these levels these
hairpin turns appear to be normal in caliber without evidence of
arteriovenous shunting.

No abnormal vasculature is noted in the delayed venous phase.

The spinal branches and the radicular branches and the muscular
branches appear normal in caliber.
IMPRESSION: Angiographically no evidence of abnormal arteriovenous shunting
noted specifically at the left T9 intercostal and the left T10
intercostal levels. These are followed caudally into the lumbar
region where again no abnormal arteriovenous shunting to suggest
arteriovenous malformation or a dural arteriovenous fistula was
noted on this study.

PLAN:
As per this arteriogram, no angiographic evidence to suggest
arteriovenous shunting, spinal arteriovenous fistula, or spinal
arteriovenous malformation or angiographic blush noted. Further
management as per [REDACTED].
